# Patient Record
Sex: Male | Born: 1940 | Race: White | Hispanic: No | Smoking: Former smoker
Health system: Southern US, Community
[De-identification: ages and names within clinical notes are randomized; demographics above are authoritative.]

## PROBLEM LIST (undated history)

## (undated) DIAGNOSIS — R05 Cough: Secondary | ICD-10-CM

## (undated) DIAGNOSIS — R059 Cough, unspecified: Secondary | ICD-10-CM

## (undated) DIAGNOSIS — I739 Peripheral vascular disease, unspecified: Secondary | ICD-10-CM

## (undated) DIAGNOSIS — E785 Hyperlipidemia, unspecified: Secondary | ICD-10-CM

## (undated) DIAGNOSIS — I10 Essential (primary) hypertension: Secondary | ICD-10-CM

## (undated) DIAGNOSIS — T4145XA Adverse effect of unspecified anesthetic, initial encounter: Secondary | ICD-10-CM

## (undated) DIAGNOSIS — I82409 Acute embolism and thrombosis of unspecified deep veins of unspecified lower extremity: Secondary | ICD-10-CM

## (undated) DIAGNOSIS — M1990 Unspecified osteoarthritis, unspecified site: Secondary | ICD-10-CM

## (undated) DIAGNOSIS — R0602 Shortness of breath: Secondary | ICD-10-CM

## (undated) DIAGNOSIS — I2699 Other pulmonary embolism without acute cor pulmonale: Secondary | ICD-10-CM

## (undated) DIAGNOSIS — Z8719 Personal history of other diseases of the digestive system: Secondary | ICD-10-CM

## (undated) DIAGNOSIS — T8859XA Other complications of anesthesia, initial encounter: Secondary | ICD-10-CM

## (undated) DIAGNOSIS — E7800 Pure hypercholesterolemia, unspecified: Secondary | ICD-10-CM

## (undated) DIAGNOSIS — I2510 Atherosclerotic heart disease of native coronary artery without angina pectoris: Secondary | ICD-10-CM

## (undated) DIAGNOSIS — I219 Acute myocardial infarction, unspecified: Secondary | ICD-10-CM

## (undated) DIAGNOSIS — C4441 Basal cell carcinoma of skin of scalp and neck: Secondary | ICD-10-CM

## (undated) DIAGNOSIS — R058 Other specified cough: Secondary | ICD-10-CM

## (undated) DIAGNOSIS — K219 Gastro-esophageal reflux disease without esophagitis: Secondary | ICD-10-CM

## (undated) DIAGNOSIS — I209 Angina pectoris, unspecified: Secondary | ICD-10-CM

## (undated) HISTORY — PX: TONSILLECTOMY: SUR1361

## (undated) HISTORY — PX: CORONARY ANGIOPLASTY WITH STENT PLACEMENT: SHX49

## (undated) HISTORY — PX: COLONOSCOPY W/ POLYPECTOMY: SHX1380

## (undated) HISTORY — PX: CARPAL TUNNEL RELEASE: SHX101

## (undated) HISTORY — PX: OTHER SURGICAL HISTORY: SHX169

## (undated) SURGERY — Surgical Case
Anesthesia: *Unknown

---

## 1965-06-01 HISTORY — PX: FINGER AMPUTATION: SHX636

## 2011-04-12 DIAGNOSIS — R079 Chest pain, unspecified: Secondary | ICD-10-CM

## 2011-04-13 ENCOUNTER — Encounter: Payer: Self-pay | Admitting: Cardiovascular Disease

## 2011-04-13 ENCOUNTER — Inpatient Hospital Stay (HOSPITAL_COMMUNITY)
Admission: AD | Admit: 2011-04-13 | Discharge: 2011-04-14 | DRG: 247 | Disposition: A | Discharge status: Home / Self Care | Payer: Medicare Other | Source: Other Acute Inpatient Hospital | Attending: Cardiovascular Disease | Admitting: Cardiovascular Disease

## 2011-04-13 ENCOUNTER — Other Ambulatory Visit: Payer: Self-pay

## 2011-04-13 ENCOUNTER — Encounter (HOSPITAL_COMMUNITY)
Admission: AD | Disposition: A | Discharge status: Home / Self Care | Payer: Self-pay | Source: Other Acute Inpatient Hospital | Attending: Cardiovascular Disease

## 2011-04-13 DIAGNOSIS — I200 Unstable angina: Secondary | ICD-10-CM

## 2011-04-13 DIAGNOSIS — I10 Essential (primary) hypertension: Secondary | ICD-10-CM | POA: Diagnosis present

## 2011-04-13 DIAGNOSIS — R0602 Shortness of breath: Secondary | ICD-10-CM

## 2011-04-13 DIAGNOSIS — I2510 Atherosclerotic heart disease of native coronary artery without angina pectoris: Principal | ICD-10-CM | POA: Diagnosis present

## 2011-04-13 DIAGNOSIS — Z9861 Coronary angioplasty status: Secondary | ICD-10-CM

## 2011-04-13 DIAGNOSIS — Z7982 Long term (current) use of aspirin: Secondary | ICD-10-CM

## 2011-04-13 DIAGNOSIS — E785 Hyperlipidemia, unspecified: Secondary | ICD-10-CM | POA: Diagnosis present

## 2011-04-13 HISTORY — DX: Hyperlipidemia, unspecified: E78.5

## 2011-04-13 HISTORY — DX: Other specified cough: R05.8

## 2011-04-13 HISTORY — DX: Peripheral vascular disease, unspecified: I73.9

## 2011-04-13 HISTORY — DX: Atherosclerotic heart disease of native coronary artery without angina pectoris: I25.10

## 2011-04-13 HISTORY — DX: Angina pectoris, unspecified: I20.9

## 2011-04-13 HISTORY — DX: Acute myocardial infarction, unspecified: I21.9

## 2011-04-13 HISTORY — DX: Shortness of breath: R06.02

## 2011-04-13 HISTORY — DX: Adverse effect of unspecified anesthetic, initial encounter: T41.45XA

## 2011-04-13 HISTORY — PX: PERCUTANEOUS CORONARY STENT INTERVENTION (PCI-S): SHX5485

## 2011-04-13 HISTORY — DX: Cough: R05

## 2011-04-13 HISTORY — PX: LEFT HEART CATHETERIZATION WITH CORONARY ANGIOGRAM: SHX5451

## 2011-04-13 HISTORY — DX: Essential (primary) hypertension: I10

## 2011-04-13 HISTORY — DX: Other complications of anesthesia, initial encounter: T88.59XA

## 2011-04-13 SURGERY — LEFT HEART CATHETERIZATION WITH CORONARY ANGIOGRAM
Anesthesia: LOCAL | Site: Groin | Laterality: Right

## 2011-04-13 MED ORDER — ACETAMINOPHEN 325 MG PO TABS: 650.0000 mg | ORAL_TABLET | ORAL | Status: DC | PRN

## 2011-04-13 MED ORDER — NITROGLYCERIN 0.2 MG/ML ON CALL CATH LAB
INTRAVENOUS | Status: AC
  Filled 2011-04-13: qty 1

## 2011-04-13 MED ORDER — MORPHINE SULFATE 2 MG/ML IJ SOLN: 2.0000 mg | INTRAMUSCULAR | Status: DC | PRN

## 2011-04-13 MED ORDER — SODIUM CHLORIDE 0.9 % IV SOLN
INTRAVENOUS | Status: AC
  Filled 2011-04-13: qty 250

## 2011-04-13 MED ORDER — CLOPIDOGREL BISULFATE 75 MG PO TABS
75.0000 mg | ORAL_TABLET | Freq: Every day | ORAL | Status: DC
  Administered 2011-04-14: 75 mg via ORAL
  Filled 2011-04-13: qty 1

## 2011-04-13 MED ORDER — HEPARIN (PORCINE) IN NACL 2-0.9 UNIT/ML-% IJ SOLN
INTRAMUSCULAR | Status: AC
  Filled 2011-04-13: qty 2000

## 2011-04-13 MED ORDER — ASPIRIN 81 MG PO CHEW
81.0000 mg | CHEWABLE_TABLET | Freq: Every day | ORAL | Status: DC
  Administered 2011-04-14: 81 mg via ORAL
  Filled 2011-04-13: qty 1

## 2011-04-13 MED ORDER — FENTANYL CITRATE 0.05 MG/ML IJ SOLN
INTRAMUSCULAR | Status: AC
  Filled 2011-04-13: qty 2

## 2011-04-13 MED ORDER — ROSUVASTATIN CALCIUM 20 MG PO TABS
20.0000 mg | ORAL_TABLET | Freq: Every day | ORAL | Status: DC
  Administered 2011-04-14: 20 mg via ORAL
  Filled 2011-04-13 (×2): qty 1

## 2011-04-13 MED ORDER — METOPROLOL SUCCINATE ER 100 MG PO TB24
100.0000 mg | ORAL_TABLET | Freq: Every day | ORAL | Status: DC
  Administered 2011-04-14: 100 mg via ORAL
  Filled 2011-04-13 (×2): qty 1

## 2011-04-13 MED ORDER — SODIUM CHLORIDE 0.9 % IV SOLN: 0.2500 mg/kg/h | INTRAVENOUS | Status: DC

## 2011-04-13 MED ORDER — CLOPIDOGREL BISULFATE 300 MG PO TABS
ORAL_TABLET | ORAL | Status: AC
  Administered 2011-04-14: 75 mg via ORAL
  Filled 2011-04-13: qty 2

## 2011-04-13 MED ORDER — LIDOCAINE HCL (PF) 1 % IJ SOLN
INTRAMUSCULAR | Status: AC
  Filled 2011-04-13: qty 30

## 2011-04-13 MED ORDER — OXYCODONE-ACETAMINOPHEN 5-325 MG PO TABS: 1.0000 | ORAL_TABLET | ORAL | Status: DC | PRN

## 2011-04-13 MED ORDER — NIACIN ER 500 MG PO CPCR
750.0000 mg | ORAL_CAPSULE | Freq: Every day | ORAL | Status: DC
  Administered 2011-04-13: 750 mg via ORAL
  Filled 2011-04-13 (×2): qty 1

## 2011-04-13 MED ORDER — ENALAPRIL MALEATE 20 MG PO TABS
20.0000 mg | ORAL_TABLET | Freq: Every day | ORAL | Status: DC
  Administered 2011-04-14: 20 mg via ORAL
  Filled 2011-04-13 (×2): qty 1

## 2011-04-13 MED ORDER — ONDANSETRON HCL 4 MG/2ML IJ SOLN: 4.0000 mg | Freq: Four times a day (QID) | INTRAMUSCULAR | Status: DC | PRN

## 2011-04-13 MED ORDER — NIFEDIPINE ER OSMOTIC RELEASE 90 MG PO TB24
90.0000 mg | ORAL_TABLET | Freq: Every day | ORAL | Status: AC
  Filled 2011-04-13: qty 1

## 2011-04-13 MED ORDER — SODIUM CHLORIDE 0.9 % IV SOLN: INTRAVENOUS | Status: AC

## 2011-04-13 NOTE — H&P (Signed)
  Reviewed H&P from [facility].  Primary Cardiologist Dr Michelle Piper Degent History of stents in [facility] CT.  No details available.  Last one 6 years ago Admitted with unstable angina.  Enzymes negative.  ECG with T wave inversions In the inferior leads.  Brought straight to cath lab for diagnostic angiogram  Date of Initial H&P: 04/12/11 at [facility] will be scanned into system  History reviewed, patient examined, no change in status, stable for surgery.  Charlton Haws 1:09 PM 04/13/11

## 2011-04-13 NOTE — Procedures (Signed)
Catheterization  Indication: Unstable Angina direct admit via cath lab for urgent cath  Procedure: After informed consent and clinical "time out" the right groin was prepped and draped in a sterile fashion.  A 5Fr sheath was placed in the right femoral artery using seldinger technique and local lidocaine.  Standard JL4, JR4 and angled pigtail catheters were used to engage the coronary arteries.  Coronary arteries were visualized in orthogonal views using caudal and cranial angulation.  RAO ventriculography was done using 28 cc of contrast.    Medications:   Versed: 0 mg's  Fentanyl: 0 ug's  Coronary Arteries: Left  dominant with no anomalies  LM: 30%  LAD: proximal- 40%, mid-20% with patent stent, distal-20%    D1- small no critical disease  D2- small no critical disease   Circumflex: proximal- 30%, mid- 95% stenosis prior to previously placed stents, AV groove- 30% multiple discrete  OM1- 30% multiple discrete  OM2- 30% multiple discrete   RCA: promimal- Small nondominant wit subtotal occlusion at the RV branch,  Ventriculography: EF: 55% %, inferobasal hypokinesis  Hemodynamics:  Aortic Pressure: 145 66 mmHg  LV Pressure: 153 20 mmHg  Impression:  Reviewed with Dr Elberta Fortis  Will proceed with DES to left dominant circumflex.  Patent stent to mid LAD  Charlton Haws 04/13/2011 1:29 PM

## 2011-04-13 NOTE — Procedures (Signed)
Cardiac Catheterization Operative Report  Rodriques Badie [id number] 11/12/20122:12 PM No primary provider on file.  Procedure Performed:  1. PTCA/DES x 1 Circumflex artery  Operator: Verne Carrow, MD  Indication: 70 yo WM with h/o CAD with diagnostic catheterization today showing severe stenosis mid Circumflex artery. Diagnostic cath per Dr. Eden Emms.                                          Procedure Details: When I entered the case, the pt had a 5 French sheath present in the right femoral artery. I upsized the sheath to a 6 [place] sheath. The patient was given a bolus of Angiomax and a drip was started. The patient was given 600 mg po Plavix. I engaged the left main artery with a 6 [place] XB 3.5 guiding catheter. A Cougar IC wire was advanced down the Circumflex artery. A 2.5 mm x 12 mm balloon was inflated in the mid Circumflex. A 2.5 x 16 mm Promus Element DES was deployed in the mid Circumflex. A 2.75 mm x 12 mm balloon was used to post-dilate the stent. The patient was given an additional Fentanyl  50 mcg IV x 1. The stenosis was taken from 99% to 0%.  There were no immediate complications. An Angioseal femoral artery closure device was placed in the right femoral artery. The patient was taken to the recovery area in stable condition.   Hemodynamic Findings: Central aortic pressure:171/82   Impression: 1. Successful PTCA/DES x 1 mid Circumflex artery.   Recommendations: Dual antiplatelet therapy with ASA and Plavix for at least one year.        Complications:  None; patient tolerated the procedure well.

## 2011-04-14 ENCOUNTER — Encounter (HOSPITAL_COMMUNITY): Payer: Self-pay

## 2011-04-14 DIAGNOSIS — I200 Unstable angina: Secondary | ICD-10-CM

## 2011-04-14 LAB — CBC
HCT: 37.7 % — ABNORMAL LOW (ref 39.0–52.0)
MCH: 30 pg (ref 26.0–34.0)
MCHC: 35.3 g/dL (ref 30.0–36.0)
MCV: 85.1 fL (ref 78.0–100.0)
RDW: 13.2 % (ref 11.5–15.5)

## 2011-04-14 LAB — BASIC METABOLIC PANEL
BUN: 12 mg/dL (ref 6–23)
Calcium: 9.1 mg/dL (ref 8.4–10.5)
Creatinine, Ser: 1.05 mg/dL (ref 0.50–1.35)
GFR calc Af Amer: 80 mL/min — ABNORMAL LOW (ref >90)
GFR calc non Af Amer: 69 mL/min — ABNORMAL LOW (ref >90)
Glucose, Bld: 143 mg/dL — ABNORMAL HIGH (ref 70–99)
Potassium: 4.1 mEq/L (ref 3.5–5.1)

## 2011-04-14 MED ORDER — CLOPIDOGREL BISULFATE 75 MG PO TABS: 75.0000 mg | ORAL_TABLET | Freq: Every day | ORAL | Status: AC

## 2011-04-14 MED ORDER — NITROGLYCERIN 0.4 MG SL SUBL: 0.4000 mg | SUBLINGUAL_TABLET | SUBLINGUAL | Status: DC | PRN

## 2011-04-14 NOTE — Discharge Planning (Cosign Needed)
Physician Discharge Summary  Patient ID: Raymond Jacobson MRN: [id number] DOB/AGE: 1941/02/21 70 y.o.  Admit date: 04/13/2011 Discharge date: 04/14/2011  Primary Discharge Diagnosis: Anginal Chest Pain Secondary Discharge Diagnosis: HTN, elevated lipids, history of percutaneous intervention x3 in [facility] remotely, remote history of surgical repair to his triceps muscle 15 years ago  Significant Diagnostic Studies: Cardiac catheterization, left ventriculogram, coronary arteriogram, 2.5 x 16 mm promise drug-eluting stent to the mid circumflex  Hospital Course:  Raymond Jacobson is a 70 year old male with a history of coronary artery disease. He went to [facility] on 04/12/2011 for chest pain. He was seen there by cardiology. His cardiac enzymes were negative for MI. The total cholesterol was 157 with triglycerides of 275, HDL 46 and LDL 56. He was seen diarrhea and cardiology followup and he had some T-wave inversions in the inferior leads of his EKG. He was having some residual chest pain and a nitroglycerin drip was started. He had already been started on heparin. He was evaluated by fraction he was evaluated there by Dr. Kirke Corin who felt that cardiac catheterization was indicated. He was transferred to [facility] for the procedure.  The cardiac catheterization showed left main 30%, LAD 40% in the proximal portion with a patent distal stent and 20% in-stent restenosis. The circumflex had a mid 95% stenosis prior to previously placed stents. This was treated with PTCA and a drug-eluting stent reducing the stenosis to 0. The OM1 and OM 2 both had 30% lesions. The RCA was small and nondominant with a subtotal occlusion of the RV branch for which medical therapy is recommended. EF was 55% with inferobasal hypokinesis.  He tolerated the procedure well. On 04/14/2011 Raymond Jacobson was seen by Dr. Clifton James. He was doing well and his post procedure labs were stable. He is considered stable for discharge,  to followup as an outpatient with the [employer].  Discharge Exam: Blood pressure 136/70, pulse 68, temperature 99 F (37.2 C), temperature source Oral, resp. rate 18, height 5\' 8"  (1.727 m), weight 209 lb 15.8 oz (95.25 kg), SpO2 96.00%.   Labs:   Lab Results  Component Value Date   WBC 6.9 04/14/2011   HGB 13.3 04/14/2011   HCT 37.7* 04/14/2011   MCV 85.1 04/14/2011   PLT 170 04/14/2011    Lab 04/14/11 0546  NA 135  K 4.1  CL 98  CO2 27  BUN 12  CREATININE 1.05  CALCIUM 9.1  PROT --  BILITOT --  ALKPHOS --  ALT --  AST --  GLUCOSE 143*   EKG: Sinus rhythm/sinus bradycardia rate 59 with inferior T-wave inversions, no old available at this time.  FOLLOW UP PLANS AND APPOINTMENTS  Current Discharge Medication List    CONTINUE these medications which have NOT CHANGED   Details  aspirin 325 MG EC tablet Take 325 mg by mouth daily.      enalapril (VASOTEC) 20 MG tablet Take 20 mg by mouth daily.      metoprolol (TOPROL-XL) 100 MG 24 hr tablet Take 100 mg by mouth daily.      niacin (NIASPAN) 750 MG CR tablet Take 750 mg by mouth at bedtime.      NIFEdipine (ADALAT CC) 90 MG 24 hr tablet Take 90 mg by mouth daily.      rosuvastatin (CRESTOR) 20 MG tablet Take 20 mg by mouth daily.        STOP taking these medications     aspirin 325 MG tablet  BRING ALL MEDICATIONS WITH YOU TO FOLLOW UP APPOINTMENTS  Time spent with patient to include physician time:  Signed: Theodore Demark 04/14/2011, 9:31 AM Co-Sign MD

## 2011-04-14 NOTE — Progress Notes (Signed)
Pt plan for d/c today with plavix po. Pt is aware to get medications from [facility] until he is able to get meds from the [place] in [place]. CM did call the [facility] to make them aware that pt was admitted. No other needs assessed by CM. Gala Lewandowsky

## 2011-04-14 NOTE — Progress Notes (Signed)
CARDIAC REHAB PHASE I   PRE:  Rate/Rhythm: 67  BP:  Supine:   Sitting: 136/70   Standing:    SaO2:   MODE:  Ambulation: 680 ft   POST:  Rate/Rhythem: 81  BP:  Supine:   Sitting: 151/79  Standing:    SaO2:  0750 - 0900 Pt ambulated unit with 1 assist, no sob or cp. Education done. Will refer to card rehab [place] with pt permission.   Rosalie Doctor

## 2011-04-14 NOTE — Progress Notes (Signed)
RLE warm with good palpable pulses.

## 2011-04-14 NOTE — Progress Notes (Signed)
SUBJECTIVE: No chest pain. No events.   BP 147/70  Pulse 76  Temp(Src) 98.5 F (36.9 C) (Oral)  Resp 18  Ht 5\' 8"  (1.727 m)  Wt 209 lb 15.8 oz (95.25 kg)  BMI 31.93 kg/m2  SpO2 97%  Intake/Output Summary (Last 24 hours) at 04/14/11 4540 Last data filed at 04/14/11 0400  Gross per 24 hour  Intake    994 ml  Output   1450 ml  Net   -456 ml    PHYSICAL EXAM General: Well developed, well nourished, in no acute distress. Alert and oriented x 3.  Psych:  Good affect, responds appropriately Neck: No JVD. No masses noted.  Lungs: Clear bilaterally with no wheezes or rhonci noted.  Heart: RRR with no murmurs noted. Abdomen: Bowel sounds are present. Soft, non-tender.  Extremities: No lower extremity edema. Right groin cath site ok.   LABS:  CBC:  Basename 04/14/11 0546  WBC 6.9  NEUTROABS --  HGB 13.3  HCT 37.7*  MCV 85.1  PLT 170   Current Meds:    . aspirin  81 mg Oral Daily  . clopidogrel      . clopidogrel  75 mg Oral Q breakfast  . enalapril  20 mg Oral Daily  . fentaNYL      . heparin      . metoprolol  100 mg Oral Daily  . niacin  750 mg Oral QHS  . NIFEdipine  90 mg Oral Daily  . nitroGLYCERIN      . rosuvastatin  20 mg Oral Daily     ASSESSMENT AND PLAN:  1. CAD: s/p cath yesterday with severe stenosis Circumflex, now s/p DES Circumflex. He will be on ASA and Plavix for at least a year. He is on a statin, beta blocker and Ace-inh.   2. HTN: Adjustment of meds as an oupatient. He is on a beta blocker, Calcium channel blocker and an Ace inhibitor. He is followed in the [place].   3. HLD: On a statin. Follow up as an outpatient.   4. Dispo: D/C home today. He wishes to follow up with the [place]. He will establish with cardiology at the [facility].    Raymond Jacobson  11/13/20126:38 AM

## 2011-04-14 NOTE — Consult Note (Signed)
Tobacco Cessation- Pt says he quit many years ago but he had some general diet questions which were addressed.

## 2011-04-15 MED FILL — Heparin Sodium (Porcine) 100 Unt/ML in Sodium Chloride 0.45%: INTRAMUSCULAR | Qty: 250 | Status: AC

## 2011-04-15 MED FILL — Nitroglycerin IV Soln 200 MCG/ML in D5W: INTRAVENOUS | Qty: 250 | Status: AC

## 2013-06-20 ENCOUNTER — Ambulatory Visit (HOSPITAL_COMMUNITY): Admission: RE | Admit: 2013-06-20 | Payer: Non-veteran care | Source: Ambulatory Visit | Admitting: Cardiology

## 2013-06-20 ENCOUNTER — Encounter (HOSPITAL_COMMUNITY)
Admission: EM | Disposition: A | Discharge status: Home / Self Care | Payer: Medicare Other | Source: Home / Self Care | Attending: Cardiology

## 2013-06-20 ENCOUNTER — Encounter (HOSPITAL_COMMUNITY): Payer: Self-pay | Admitting: Physician Assistant

## 2013-06-20 ENCOUNTER — Inpatient Hospital Stay (HOSPITAL_COMMUNITY)
Admission: EM | Admit: 2013-06-20 | Discharge: 2013-06-22 | DRG: 251 | Disposition: A | Discharge status: Home / Self Care | Payer: Medicare Other | Attending: Cardiology | Admitting: Cardiology

## 2013-06-20 DIAGNOSIS — Z87891 Personal history of nicotine dependence: Secondary | ICD-10-CM

## 2013-06-20 DIAGNOSIS — I2510 Atherosclerotic heart disease of native coronary artery without angina pectoris: Secondary | ICD-10-CM | POA: Diagnosis present

## 2013-06-20 DIAGNOSIS — IMO0001 Reserved for inherently not codable concepts without codable children: Secondary | ICD-10-CM

## 2013-06-20 DIAGNOSIS — I739 Peripheral vascular disease, unspecified: Secondary | ICD-10-CM | POA: Diagnosis present

## 2013-06-20 DIAGNOSIS — Z7982 Long term (current) use of aspirin: Secondary | ICD-10-CM

## 2013-06-20 DIAGNOSIS — Z79899 Other long term (current) drug therapy: Secondary | ICD-10-CM | POA: Diagnosis not present

## 2013-06-20 DIAGNOSIS — E785 Hyperlipidemia, unspecified: Secondary | ICD-10-CM | POA: Diagnosis present

## 2013-06-20 DIAGNOSIS — I252 Old myocardial infarction: Secondary | ICD-10-CM | POA: Diagnosis not present

## 2013-06-20 DIAGNOSIS — Y849 Medical procedure, unspecified as the cause of abnormal reaction of the patient, or of later complication, without mention of misadventure at the time of the procedure: Secondary | ICD-10-CM | POA: Diagnosis present

## 2013-06-20 DIAGNOSIS — T82897A Other specified complication of cardiac prosthetic devices, implants and grafts, initial encounter: Principal | ICD-10-CM | POA: Diagnosis present

## 2013-06-20 DIAGNOSIS — I249 Acute ischemic heart disease, unspecified: Secondary | ICD-10-CM | POA: Diagnosis present

## 2013-06-20 DIAGNOSIS — R7309 Other abnormal glucose: Secondary | ICD-10-CM

## 2013-06-20 DIAGNOSIS — I240 Acute coronary thrombosis not resulting in myocardial infarction: Secondary | ICD-10-CM

## 2013-06-20 DIAGNOSIS — S68118A Complete traumatic metacarpophalangeal amputation of other finger, initial encounter: Secondary | ICD-10-CM

## 2013-06-20 DIAGNOSIS — I10 Essential (primary) hypertension: Secondary | ICD-10-CM | POA: Diagnosis present

## 2013-06-20 DIAGNOSIS — Z8249 Family history of ischemic heart disease and other diseases of the circulatory system: Secondary | ICD-10-CM

## 2013-06-20 DIAGNOSIS — I200 Unstable angina: Secondary | ICD-10-CM | POA: Diagnosis present

## 2013-06-20 DIAGNOSIS — R079 Chest pain, unspecified: Secondary | ICD-10-CM | POA: Diagnosis present

## 2013-06-20 DIAGNOSIS — I369 Nonrheumatic tricuspid valve disorder, unspecified: Secondary | ICD-10-CM | POA: Diagnosis not present

## 2013-06-20 HISTORY — PX: LEFT HEART CATHETERIZATION WITH CORONARY ANGIOGRAM: SHX5451

## 2013-06-20 LAB — CBC WITH DIFFERENTIAL/PLATELET
Basophils Absolute: 0 10*3/uL (ref 0.0–0.1)
Basophils Relative: 1 % (ref 0–1)
Eosinophils Absolute: 0.1 10*3/uL (ref 0.0–0.7)
Eosinophils Relative: 3 % (ref 0–5)
HCT: 34.9 % — ABNORMAL LOW (ref 39.0–52.0)
Hemoglobin: 12.5 g/dL — ABNORMAL LOW (ref 13.0–17.0)
Lymphocytes Relative: 32 % (ref 12–46)
Lymphs Abs: 1.2 10*3/uL (ref 0.7–4.0)
MCH: 30 pg (ref 26.0–34.0)
MCHC: 35.8 g/dL (ref 30.0–36.0)
MCV: 83.9 fL (ref 78.0–100.0)
Monocytes Absolute: 0.3 10*3/uL (ref 0.1–1.0)
Monocytes Relative: 10 % (ref 3–12)
NEUTROS ABS: 2 10*3/uL (ref 1.7–7.7)
NEUTROS PCT: 55 % (ref 43–77)
Platelets: 170 10*3/uL (ref 150–400)
RBC: 4.16 MIL/uL — AB (ref 4.22–5.81)
RDW: 13.6 % (ref 11.5–15.5)
WBC: 3.6 10*3/uL — ABNORMAL LOW (ref 4.0–10.5)

## 2013-06-20 LAB — POCT I-STAT, CHEM 8
BUN: 11 mg/dL (ref 6–23)
CREATININE: 1.1 mg/dL (ref 0.50–1.35)
Calcium, Ion: 1.22 mmol/L (ref 1.13–1.30)
Chloride: 101 mEq/L (ref 96–112)
Glucose, Bld: 117 mg/dL — ABNORMAL HIGH (ref 70–99)
HCT: 34 % — ABNORMAL LOW (ref 39.0–52.0)
Hemoglobin: 11.6 g/dL — ABNORMAL LOW (ref 13.0–17.0)
Potassium: 3.7 mEq/L (ref 3.7–5.3)
Sodium: 139 mEq/L (ref 137–147)
TCO2: 27 mmol/L (ref 0–100)

## 2013-06-20 LAB — COMPREHENSIVE METABOLIC PANEL
ALT: 15 U/L (ref 0–53)
AST: 14 U/L (ref 0–37)
Albumin: 3.5 g/dL (ref 3.5–5.2)
Alkaline Phosphatase: 91 U/L (ref 39–117)
BUN: 12 mg/dL (ref 6–23)
CHLORIDE: 102 meq/L (ref 96–112)
CO2: 25 meq/L (ref 19–32)
Calcium: 8.2 mg/dL — ABNORMAL LOW (ref 8.4–10.5)
Creatinine, Ser: 1.07 mg/dL (ref 0.50–1.35)
GFR calc Af Amer: 77 mL/min — ABNORMAL LOW (ref >90)
GFR calc non Af Amer: 67 mL/min — ABNORMAL LOW (ref >90)
Glucose, Bld: 115 mg/dL — ABNORMAL HIGH (ref 70–99)
POTASSIUM: 3.8 meq/L (ref 3.7–5.3)
Sodium: 140 mEq/L (ref 137–147)
Total Protein: 6.1 g/dL (ref 6.0–8.3)

## 2013-06-20 LAB — TROPONIN I: Troponin I: <0.3 ng/mL (ref <0.30)

## 2013-06-20 LAB — POCT ACTIVATED CLOTTING TIME: Activated Clotting Time: 437 seconds

## 2013-06-20 LAB — MRSA PCR SCREENING: MRSA BY PCR: NEGATIVE

## 2013-06-20 LAB — HEMOGLOBIN A1C
Hgb A1c MFr Bld: 6.3 % — ABNORMAL HIGH (ref <5.7)
Mean Plasma Glucose: 134 mg/dL — ABNORMAL HIGH (ref <117)

## 2013-06-20 SURGERY — LEFT HEART CATHETERIZATION WITH CORONARY ANGIOGRAM
Anesthesia: LOCAL

## 2013-06-20 MED ORDER — ACETAMINOPHEN 325 MG PO TABS
650.0000 mg | ORAL_TABLET | ORAL | Status: DC | PRN
  Administered 2013-06-21 – 2013-06-22 (×3): 650 mg via ORAL
  Filled 2013-06-20 (×3): qty 2

## 2013-06-20 MED ORDER — FENTANYL CITRATE 0.05 MG/ML IJ SOLN
INTRAMUSCULAR | Status: AC
  Filled 2013-06-20: qty 2

## 2013-06-20 MED ORDER — NITROGLYCERIN IN D5W 200-5 MCG/ML-% IV SOLN
2.0000 ug/min | INTRAVENOUS | Status: DC
  Filled 2013-06-20: qty 250

## 2013-06-20 MED ORDER — AMIODARONE HCL 150 MG/3ML IV SOLN
INTRAVENOUS | Status: AC
  Filled 2013-06-20: qty 6

## 2013-06-20 MED ORDER — NITROGLYCERIN IN D5W 200-5 MCG/ML-% IV SOLN
INTRAVENOUS | Status: AC
  Administered 2013-06-20: 20 ug
  Filled 2013-06-20: qty 250

## 2013-06-20 MED ORDER — TICAGRELOR 90 MG PO TABS
ORAL_TABLET | ORAL | Status: AC
  Filled 2013-06-20: qty 1

## 2013-06-20 MED ORDER — LIDOCAINE HCL (PF) 1 % IJ SOLN
INTRAMUSCULAR | Status: AC
  Filled 2013-06-20: qty 30

## 2013-06-20 MED ORDER — NIFEDIPINE ER OSMOTIC RELEASE 90 MG PO TB24
90.0000 mg | ORAL_TABLET | Freq: Every day | ORAL | Status: DC
  Administered 2013-06-20 – 2013-06-22 (×3): 90 mg via ORAL
  Filled 2013-06-20 (×3): qty 1

## 2013-06-20 MED ORDER — SODIUM CHLORIDE 0.9 % IJ SOLN
3.0000 mL | Freq: Two times a day (BID) | INTRAMUSCULAR | Status: DC
  Administered 2013-06-21: 3 mL via INTRAVENOUS

## 2013-06-20 MED ORDER — BIVALIRUDIN 250 MG IV SOLR
INTRAVENOUS | Status: AC
  Filled 2013-06-20: qty 250

## 2013-06-20 MED ORDER — METOPROLOL SUCCINATE ER 100 MG PO TB24
100.0000 mg | ORAL_TABLET | Freq: Every day | ORAL | Status: DC
  Administered 2013-06-20 – 2013-06-22 (×3): 100 mg via ORAL
  Filled 2013-06-20 (×4): qty 1

## 2013-06-20 MED ORDER — SODIUM CHLORIDE 0.9 % IJ SOLN: 3.0000 mL | INTRAMUSCULAR | Status: DC | PRN

## 2013-06-20 MED ORDER — HEPARIN SODIUM (PORCINE) 1000 UNIT/ML IJ SOLN
INTRAMUSCULAR | Status: AC
  Filled 2013-06-20: qty 1

## 2013-06-20 MED ORDER — NITROGLYCERIN 0.4 MG SL SUBL: 0.4000 mg | SUBLINGUAL_TABLET | SUBLINGUAL | Status: DC | PRN

## 2013-06-20 MED ORDER — SODIUM CHLORIDE 0.9 % IV SOLN: 250.0000 mL | INTRAVENOUS | Status: DC | PRN

## 2013-06-20 MED ORDER — MIDAZOLAM HCL 2 MG/2ML IJ SOLN
INTRAMUSCULAR | Status: AC
  Filled 2013-06-20: qty 2

## 2013-06-20 MED ORDER — ONDANSETRON HCL 4 MG/2ML IJ SOLN
INTRAMUSCULAR | Status: AC
  Filled 2013-06-20: qty 2

## 2013-06-20 MED ORDER — ASPIRIN 81 MG PO CHEW
81.0000 mg | CHEWABLE_TABLET | Freq: Every day | ORAL | Status: DC
  Administered 2013-06-21 – 2013-06-22 (×2): 81 mg via ORAL
  Filled 2013-06-20 (×2): qty 1

## 2013-06-20 MED ORDER — ENALAPRIL MALEATE 20 MG PO TABS
20.0000 mg | ORAL_TABLET | Freq: Every day | ORAL | Status: DC
  Administered 2013-06-20 – 2013-06-22 (×3): 20 mg via ORAL
  Filled 2013-06-20 (×3): qty 1

## 2013-06-20 MED ORDER — TICAGRELOR 90 MG PO TABS
90.0000 mg | ORAL_TABLET | Freq: Two times a day (BID) | ORAL | Status: DC
  Administered 2013-06-21 – 2013-06-22 (×3): 90 mg via ORAL
  Filled 2013-06-20 (×4): qty 1

## 2013-06-20 MED ORDER — SODIUM CHLORIDE 0.9 % IV SOLN
INTRAVENOUS | Status: DC
  Administered 2013-06-20: 19:00:00 via INTRAVENOUS

## 2013-06-20 MED ORDER — HEPARIN (PORCINE) IN NACL 2-0.9 UNIT/ML-% IJ SOLN
INTRAMUSCULAR | Status: AC
  Filled 2013-06-20: qty 1000

## 2013-06-20 MED ORDER — ATORVASTATIN CALCIUM 40 MG PO TABS
40.0000 mg | ORAL_TABLET | Freq: Every day | ORAL | Status: DC
Start: 2013-06-20 — End: 2013-06-21
  Administered 2013-06-20: 40 mg via ORAL
  Filled 2013-06-20 (×2): qty 1

## 2013-06-20 MED ORDER — VERAPAMIL HCL 2.5 MG/ML IV SOLN
INTRAVENOUS | Status: AC
Start: 2013-06-20 — End: 2013-06-20
  Filled 2013-06-20: qty 2

## 2013-06-20 MED ORDER — ADENOSINE 12 MG/4ML IV SOLN
16.0000 mL | INTRAVENOUS | Status: DC
  Filled 2013-06-20: qty 16

## 2013-06-20 MED ORDER — SODIUM CHLORIDE 0.9 % IV SOLN
INTRAVENOUS | Status: AC
  Filled 2013-06-20: qty 100

## 2013-06-20 MED ORDER — ONDANSETRON HCL 4 MG/2ML IJ SOLN
4.0000 mg | Freq: Four times a day (QID) | INTRAMUSCULAR | Status: DC | PRN
  Administered 2013-06-21: 4 mg via INTRAVENOUS
  Filled 2013-06-20: qty 2

## 2013-06-20 MED ORDER — NITROGLYCERIN 0.2 MG/ML ON CALL CATH LAB
INTRAVENOUS | Status: AC
  Filled 2013-06-20: qty 1

## 2013-06-20 NOTE — H&P (Addendum)
History and Physical Interval Note:  NAME:  Jaelyn Cloninger   MRN: [id number] DOB:  [birth year]   ADMIT DATE: 06/20/2013   06/20/2013 7:13 PM  Jenny Reichmann Wolbert is a 73 y.o. male with Known CAD - PCI to Cx (x3 stents). With the most recent Being a November 2012. At that time he had severe stenosis in the midcircumflex treated with a 2.5 mm x 16 mm Promus Element DES postdilated to 2.75 mm. The lesion was prior to the previously placed stents. [facility] He was in his usual state of health until last couple days and he started noticing left-sided substernal chest discomfort that would come and go. It wasn't exacerbated by exertion and sitting up. He noted he awoke this morning with similar symptoms. It initially went away and then returned each episode lasted 10-15 minutes. This afternoon he contacted EMS, and upon arrival he was having 10 out of 10 discomfort reduced to 2/10 after 10 mg of IV morphine. An EKG done in the field was read as inferior STEMI, however on review of ECG X. he appears to be an inferior infarct age undetermined and not in acute MI. Present as he had related to his previous symptoms. They seem to be quite similar and associated with diaphoresis and dyspnea. No heartbeat or palpitations. No syncope near-syncope , no melena hematochezia hematuria.  However upon arrival the patient continues to have 2-3/10 chest pain. Given his history of circumflex disease that may not show up well on ECG, decided the best plan of action would be to proceed with urgent cardiac catheterization for what seems to be an unstable angina/ACS process.  Past Medical History  Diagnosis Date  . CAD (coronary artery disease)     Previous stents last 6 years ago [place] CT  . Elevated lipids   . Hypertension   . Angina   . Myocardial infarction ~ 2006    "blood work"  . Peripheral vascular disease   . Dry cough   . Shortness of breath 04/13/11    "mostly w/exertion"  .  Complication of anesthesia   . PONV (postoperative nausea and vomiting)    Past Surgical History  Procedure Laterality Date  . Tricep surgery  ~ 2007    "right arm; tore all my muscles @ my elbow"  . Tonsillectomy      "when I was a young one"  . 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 Coronary angioplasty with stent placement  ~ 2004; ~ 2006; 04/13/11    "# of stents 1 (i2004);+2 (2006)+ 1 (04/13/11)= 4 total"   FAMHx: Family History  Problem Relation Age of Onset  . Heart attack Paternal Grandfather   ; noncontributory.  SOCHx:  reports that he has quit smoking. His smoking use included Cigarettes. He has a 12 pack-year smoking history. He does not have any smokeless tobacco history on file. He reports that he drinks alcohol. He reports that he does not use illicit drugs.  ALLERGIES: No Known Allergies  HOME MEDICATIONS: Prescriptions prior to admission  Medication Sig Dispense Refill  . aspirin 325 MG EC tablet Take 325 mg by mouth daily.        . enalapril (VASOTEC) 20 MG tablet Take 20 mg by mouth daily.        . metoprolol (TOPROL-XL) 100 MG 24 hr tablet Take 100 mg by mouth daily.        . niacin (NIASPAN)  750 MG CR tablet Take 750 mg by mouth at bedtime.        Marland Kitchen NIFEdipine (ADALAT CC) 90 MG 24 hr tablet Take 90 mg by mouth daily.        . nitroGLYCERIN (NITROSTAT) 0.4 MG SL tablet Place 1 tablet (0.4 mg total) under the tongue every 5 (five) minutes as needed for chest pain.  25 tablet  3  . rosuvastatin (CRESTOR) 20 MG tablet Take 20 mg by mouth daily.         no longer on dual antiplatelet therapy.  REVIEW OF SYSTEMS:  Review of Systems  Constitutional: Positive for diaphoresis. Negative for fever.  HENT: Negative for congestion.   Eyes: Negative.   Respiratory: Negative for cough and shortness of breath.   Cardiovascular: Positive for chest pain. Negative for palpitations, orthopnea, leg swelling and PND.  Gastrointestinal: Positive for nausea.  Negative for blood in stool and melena.  Genitourinary: Negative for hematuria.  Musculoskeletal: Positive for joint pain.  Neurological: Negative for dizziness.  Endo/Heme/Allergies: Negative.   Psychiatric/Behavioral: Negative.   All other systems reviewed and are negative.   PHYSICAL EXAM:Blood pressure 194/76, temperature 98.6 F (37 C), resp. rate 13, SpO2 98.00%. General appearance: alert, cooperative, appears stated age, mild distress and Mildly diaphoretic Neck: no adenopathy, no carotid bruit, no JVD, supple, symmetrical, trachea midline and thyroid not enlarged, symmetric, no tenderness/mass/nodules Lungs: clear to auscultation bilaterally, normal percussion bilaterally and Nonlabored. Heart: regular rate and rhythm, S1, S2 normal, no murmur, click, rub or gallop and normal apical impulse Abdomen: soft, non-tender; bowel sounds normal; no masses,  no organomegaly Extremities: extremities normal, atraumatic, no cyanosis or edema Pulses: 2+ and symmetric Neurologic: Alert and oriented X 3, normal strength and tone. Normal symmetric reflexes. Normal coordination and gait  IMPRESSION & PLAN The patients' history has been reviewed, patient examined, no change in status from most recent note, stable for surgery. I have reviewed the patients' chart and labs. Questions were answered to the patient's satisfaction.    Shaman Muscarella has presented today for surgery, with the diagnosis of Acute Coronary Syndrome. The various methods of treatment have been discussed with the patient and family.   Risks / Complications include, but not limited to: Death, MI, CVA/TIA, VF/VT (with defibrillation), Bradycardia (need for temporary pacer placement), contrast induced nephropathy, bleeding / bruising / hematoma / pseudoaneurysm, vascular or coronary injury (with possible emergent CT or Vascular Surgery), adverse medication reactions, infection.     After consideration of risks, benefits and other  options for treatment, the patient has consented to Procedure(s):   LEFT HEART CATHETERIZATION AND CORONARY ANGIOGRAPHY +/- AD [place]  as a surgical intervention.   We will proceed with the planned procedure. Further plans based on the results of the procedure.   Cath Lab Visit (complete for each Cath Lab visit)  Clinical Evaluation Leading to the Procedure:   ACS: yes  Non-ACS:    Anginal Classification: CCS IV  Anti-ischemic medical therapy: Maximal Therapy (2 or more classes of medications)  Non-Invasive Test Results: No non-invasive testing performed  Prior CABG: No previous CABG    ROS/FH updated  Landyn Buckalew, [address]. [place], [place]  [address]  [phone]  06/20/2013 7:13 PM

## 2013-06-20 NOTE — CV Procedure (Signed)
CARDIAC CATHETERIZATION & PERCUTANEOUS CORONARY INTERVENTION REPORT  NAME:  Raymond Jacobson   MRN: [id number] DOB:  [birth year]   ADMIT DATE: 06/20/2013 Procedure Date: 06/20/2013  INTERVENTIONAL CARDIOLOGIST: Leonie Man, M.D., MS PRIMARY CARE PROVIDER: Provider Not In System PRIMARY CARDIOLOGIST: [place], [place]  PATIENT:  Raymond Jacobson is a 73 y.o. male   PRE-OPERATIVE DIAGNOSIS:    Acute Coronary Syndrome  Known CAD  PROCEDURES PERFORMED:    LEFT HEART CATHETERIZATION WITH CORONARY ANGIOGRAPHY  CUTTING BALLOON ANGIOPLASTY ONLY OF MID CIRCUMFLEX 80% ISR - FLEXTOME 2.75 MM X 10 MM   PROCEDURE:Consent:  Risks of procedure as well as the alternatives and risks of each were explained to the (patient/caregiver).  Consent for procedure obtained. and Unable to obtain consent because of altered level of consciousness. 10 mg Morphine Given.  PROCEDURE: The patient was brought to the [facility]nd [facility] in the fasting state and prepped and draped in the usual sterile fashion for Right groin or radial access. A modified Allen's test with plethysmography was performed, revealing excellent Ulnar artery collateral flow.  Sterile technique was used including antiseptics, cap, gloves, gown, hand hygiene, mask and sheet.  Skin prep: Chlorhexidine.  Time Out: Verified patient identification, verified procedure, site/side was marked, verified correct patient position, special equipment/implants available, medications/allergies/relevent history reviewed, required imaging and test results available.  Performed  DIAGNOSTIC LEFT HEART CATHETERIZATION & ANGIOGRAPHY: Access: RIGHT RADIAL Artery; 6 Fr Sheath --  Seldinger technique (Angiocath Micropuncture Kit)  IA Radial Cocktail 10 mL, IV Heparin 4000 Units   Catheters: 5 Fr TIG 4.0, JL3.5,  5 Fr EBU, 6 Fr XB 3.5 Guide (and Angled Pigtail - that did not cross the Aortic Valve due to brachial artery spasm post  PCI).  Left Coronary Artery Angiography: XB 3.0 Guide    Right Coronary Artery Angiography: TIG 4.0  LV Hemodynamics (LV Gram): TIG 4.0 (no LV Gram).  TR Band:  1808 Hours, 17 mL air  MEDICATIONS:  Anesthesia:  Local Lidocaine 2 ml  Sedation:  2 mg IV Versed, 25 mcg IV fentanyl ;   Premedication: 10 mg Morphine given by EMS  Omnipaque Contrast: 160 ml  Anticoagulation:  IV Heparin 4000 Units ;  Then for PCI Angiomax Bolus & drip  Anti-Platelet Agent:  Brilinta 180 mg  Hemodynamics:  Central Aortic / Mean Pressures: 132/64 mmHg; 90 mmHg  Left Ventricular Pressures / EDP: 127/11 mmHg; 14 mmHg  Left Ventriculography:  Not done - unable to re-cross aortic Valve post PCI due to mild brachial artery spasm.  Coronary Anatomy:  Left Main: Large caliber vessel, trifurcates into LAD, Circumflex & Small caliber Ramus Intermedius. LAD: Large caliber vessel that has a proximal septal perforator (SP1) & Diagonal 1 (D1) followed by a short segment that is diffuse tender 20% irregular stenoses in the intervening segment between D1 and D2 which also has a septal perforator. Beyond D2, there is a widely patent stents. The vessel then is somewhat tortuous but angiographically normal as it courses around the apex perfusing the inferoapex.  D1: Moderate large-caliber vessel that perfuses part of the anterolateral wall. Mildly tortuous but free of any significant disease. Tapers down distally.  D2: Small-caliber vessel with mild luminal irregularities. Left Circumflex: Large-caliber, likely dominant vessel that gives off a very small marginal branch proximally in segment that is a widely patent stent. As it courses into the AV groove it gives off a large lateral OM1 that is widely patent.  It then courses into the AV groove where there is a stent that has a roughly 60-80% in-stent restenosis in the distal two thirds of the recently placed Promus Element DES stent. Beyond the stent the vessel  branches into 4 separate branches: 2 posterolateral branches that are moderate caliber, one small distal posterolateral branch that almost runs as a tandem PDA followed by the PDA.  OM1: Large-caliber vessel that courses along the inferolateral wall to the apex. Mild luminal irregularities proximally, but otherwise normal.  LPL 1 and 2: Moderate caliber vessels (LPL 2 >1), mild luminal irregularities.  L. PDA: This is a larger and more distal of the 2 PDA branches. It is moderate caliber with mild luminal irregularities.  Ramus Intermedius: Small-moderate-caliber vessel with an ostial 90% stenosis. The vessel itself is probably less than 2 mm in size and not amenable to PCI placed in the ostial lesion.   RCA: Small caliber nondominant vessel that is subtotally occluded just before bifurcates into an RV marginal branch and the follow on distal branch. This is similar to the previous results.  After reviewing the initial angiographic data, to potential culprit lesions are visible: Ostial Ramus Intermedius 90%, and mid-distal circumflex ISR. The Ramus lesion is not amenable to PCI, therefore medical therapy would be best for this. The circumflex lesion in some views appears to be nonsignificant however there are some the images in which it appears significant. The initial thought was to perform FFR of this lesion, however I was unable to pass the FFR wire beyond the lesion, suggesting that the lesion is more significant than initially thought. At that time the plan was to proceed with Cutting Balloon angioplasty of his in-stent restenosis.  Percutaneous Coronary Intervention:    Lesion: Distal-mid Circumflex In-Stent Restenosis --   70-80% reduced to 0%;   TIMI-3 flow pre-and post Guide: 6 Fr   EBU (already in place)   Guidewire: Initially attempted to use a [employer] that was unable to pass beyond the lesion. Therefore a [employer] wire was used for angioplasty Cutting Balloon: Flextome 2.75 mm  x 10 mm;   8 Atm x 45 Sec, Final Diameter = 2.8 mm  Post deployment angiography in multiple views, with and without guidewire in place revealed excellent stent deployment and lesion coverage.  There was no evidence of dissection or perforation.  PATIENT DISPOSITION:    The patient was transferred to the PACU holding area in a hemodynamicaly stable, chest pain free condition.  The patient tolerated the procedure well, and there were no complications.  EBL:   < 20 ml  The patient was stable before, during, and after the procedure.  POST-OPERATIVE DIAGNOSIS:    Severe 2 vessel disease with widely patent stent in the LAD and proximal circumflex, but Ostial Ramus Intermedius As Well as distal mid circumflex is a restenosis of the recently placed DES stents from 2012.  Successful Cutting Balloon angioplasty of the in-stent restenosis resulting in complete resolution of the stenosis.  Mildly elevated LVEDP.  PLAN OF CARE:  The patient will be admitted overnight for post ACS related PCI/PTCA.  Standard post radial cath care.  Brilinta therapy. My recommendation would be to continue for a minimum of 1 year as this is ACS.  I will order an Echocardiogram as I was unable to perform Left Ventriculography.  Continue home medications.  Anticipate discharge tomorrow if stable. He will need a followup with the [facility] in [place], [place].   Leonie Man, M.D., M.S.  [facility] [address]. [place], [place]  [address]  [phone]  06/20/2013 6:19 PM

## 2013-06-21 ENCOUNTER — Encounter (HOSPITAL_COMMUNITY): Payer: Self-pay | Admitting: *Deleted

## 2013-06-21 DIAGNOSIS — I2510 Atherosclerotic heart disease of native coronary artery without angina pectoris: Secondary | ICD-10-CM

## 2013-06-21 DIAGNOSIS — I369 Nonrheumatic tricuspid valve disorder, unspecified: Secondary | ICD-10-CM

## 2013-06-21 LAB — BASIC METABOLIC PANEL
BUN: 9 mg/dL (ref 6–23)
CALCIUM: 8.1 mg/dL — AB (ref 8.4–10.5)
CHLORIDE: 102 meq/L (ref 96–112)
CO2: 28 mEq/L (ref 19–32)
CREATININE: 1.07 mg/dL (ref 0.50–1.35)
GFR, EST AFRICAN AMERICAN: 77 mL/min — AB (ref >90)
GFR, EST NON AFRICAN AMERICAN: 67 mL/min — AB (ref >90)
Glucose, Bld: 116 mg/dL — ABNORMAL HIGH (ref 70–99)
Potassium: 4 mEq/L (ref 3.7–5.3)
Sodium: 140 mEq/L (ref 137–147)

## 2013-06-21 LAB — TROPONIN I
Troponin I: 0.35 ng/mL (ref <0.30)
Troponin I: <0.3 ng/mL (ref <0.30)
Troponin I: <0.3 ng/mL (ref <0.30)

## 2013-06-21 LAB — COMPREHENSIVE METABOLIC PANEL
ALBUMIN: 3.9 g/dL (ref 3.5–5.2)
ALT: 16 U/L (ref 0–53)
AST: 16 U/L (ref 0–37)
Alkaline Phosphatase: 104 U/L (ref 39–117)
BUN: 11 mg/dL (ref 6–23)
CALCIUM: 8.6 mg/dL (ref 8.4–10.5)
CHLORIDE: 96 meq/L (ref 96–112)
CO2: 26 mEq/L (ref 19–32)
CREATININE: 1.11 mg/dL (ref 0.50–1.35)
GFR calc Af Amer: 74 mL/min — ABNORMAL LOW (ref >90)
GFR, EST NON AFRICAN AMERICAN: 64 mL/min — AB (ref >90)
Glucose, Bld: 204 mg/dL — ABNORMAL HIGH (ref 70–99)
Potassium: 4 mEq/L (ref 3.7–5.3)
Sodium: 138 mEq/L (ref 137–147)
Total Bilirubin: 0.6 mg/dL (ref 0.3–1.2)
Total Protein: 6.8 g/dL (ref 6.0–8.3)

## 2013-06-21 LAB — CBC
HCT: 34.8 % — ABNORMAL LOW (ref 39.0–52.0)
Hemoglobin: 12 g/dL — ABNORMAL LOW (ref 13.0–17.0)
MCH: 29.7 pg (ref 26.0–34.0)
MCHC: 34.5 g/dL (ref 30.0–36.0)
MCV: 86.1 fL (ref 78.0–100.0)
PLATELETS: 177 10*3/uL (ref 150–400)
RBC: 4.04 MIL/uL — ABNORMAL LOW (ref 4.22–5.81)
RDW: 13.9 % (ref 11.5–15.5)
WBC: 5.5 10*3/uL (ref 4.0–10.5)

## 2013-06-21 LAB — TSH: TSH: 1.935 u[IU]/mL (ref 0.350–4.500)

## 2013-06-21 MED ORDER — GI COCKTAIL ~~LOC~~
30.0000 mL | Freq: Three times a day (TID) | ORAL | Status: DC | PRN
  Administered 2013-06-21: 30 mL via ORAL
  Filled 2013-06-21: qty 30

## 2013-06-21 MED ORDER — MORPHINE SULFATE 2 MG/ML IJ SOLN
INTRAMUSCULAR | Status: AC
  Administered 2013-06-21: 2 mg via INTRAVENOUS
  Filled 2013-06-21: qty 1

## 2013-06-21 MED ORDER — ATORVASTATIN CALCIUM 80 MG PO TABS
80.0000 mg | ORAL_TABLET | Freq: Every day | ORAL | Status: DC
  Administered 2013-06-21: 80 mg via ORAL
  Filled 2013-06-21 (×2): qty 1

## 2013-06-21 MED ORDER — HYDRALAZINE HCL 20 MG/ML IJ SOLN
10.0000 mg | Freq: Once | INTRAMUSCULAR | Status: AC
Start: 2013-06-21 — End: 2013-06-21
  Administered 2013-06-21: 10 mg via INTRAVENOUS
  Filled 2013-06-21: qty 0.5

## 2013-06-21 MED ORDER — TRAMADOL HCL 50 MG PO TABS: 50.0000 mg | ORAL_TABLET | Freq: Two times a day (BID) | ORAL | Status: DC | PRN

## 2013-06-21 MED ORDER — MORPHINE SULFATE 2 MG/ML IJ SOLN: 2.0000 mg | Freq: Once | INTRAMUSCULAR | Status: AC

## 2013-06-21 MED FILL — Sodium Chloride IV Soln 0.9%: INTRAVENOUS | Qty: 50 | Status: AC

## 2013-06-21 NOTE — Progress Notes (Signed)
  Echocardiogram 2D Echocardiogram has been performed.  Raymond Jacobson 06/21/2013, 12:32 PM

## 2013-06-21 NOTE — Progress Notes (Signed)
On call MD (Dr. Aundra Dubin) notified re pt's continued high Bp. Stated to cont to titrate the Ntg gtt up. Will monitor closely.  Dr. Aundra Dubin paged re c/o cp. Egk done. New order received. Pt also vomited about 175 cc clear sl red liquid. Zofran given. Morphine also given and ntg titrated up as ordered. will cont to monitor closely.

## 2013-06-21 NOTE — Progress Notes (Signed)
CARDIAC REHAB PHASE I   PRE:  Rate/Rhythm: 70SR  BP:  Supine: 138/64  Sitting:   Standing:    SaO2:   MODE:  Ambulation: 390 ft   POST:  Rate/Rhythm: 74Sr  BP:  Supine: 144/64  Sitting:   Standing:    SaO2:  1415-1510 Pt walked 390 ft on 10 mcg NTG without increase of chest discomfort. Belched at end of walk which pt states he has been doing today.  Pt states the walk did not make him have any increased sensation in chest. Education completed. Discussed watching carbs with HGBA1C at 6.3. Gave heart healthy diet and diet that gives servings of carbs for information. Discussed CRP2 but declined. Wants to exercise on his own. Has brilinta booklet and has been seen by case manager.    Graylon Good, RN BSN  06/21/2013 3:07 PM

## 2013-06-21 NOTE — H&P (Signed)
Pt seen & examined.   I agree with his note.  Please see pre-cath H&P.  Leonie Man, MD

## 2013-06-21 NOTE — Progress Notes (Signed)
Subjective:   Underwent cutting balloon angioplasty of promus DES (from 11/12) in the mid to distal AV groove LCX yesterday. Also had 80-90% lesion in small ramus which was treated medically.   This am with recurrent CP. Worse when lying on his back and better when rolling over. + belching.  Now on 100 mcg NTG. ECG with nonspecific TW abnormality inferiorly (unchanged). Troponin <0.3-> 0.35  LAD: Large caliber vessel that has a proximal septal perforator (SP1) & Diagonal 1 (D1) followed by a short segment that is diffuse tender 20% irregular stenoses in the intervening segment between D1 and D2 which also has a septal perforator. Beyond D2, there is a widely patent stents. The vessel then is somewhat tortuous but angiographically normal as it courses around the apex perfusing the inferoapex.  D1: Moderate large-caliber vessel that perfuses part of the anterolateral wall. Mildly tortuous but free of any significant disease. Tapers down distally.  D2: Small-caliber vessel with mild luminal irregularities. Left Circumflex: Large-caliber, likely dominant vessel that gives off a very small marginal branch proximally in segment that is a widely patent stent. As it courses into the AV groove it gives off a large lateral OM1 that is widely patent. It then courses into the AV groove where there is a stent that has a roughly 60-80% in-stent restenosis in the distal two thirds of the recently placed Promus Element DES stent. Beyond the stent the vessel branches into 4 separate branches: 2 posterolateral branches that are moderate caliber, one small distal posterolateral branch that almost runs as a tandem PDA followed by the PDA.  OM1: Large-caliber vessel that courses along the inferolateral wall to the apex. Mild luminal irregularities proximally, but otherwise normal.  LPL 1 and 2: Moderate caliber vessels (LPL 2 >1), mild luminal irregularities.  L. PDA: This is a larger and more distal of the 2 PDA branches.  It is moderate caliber with mild luminal irregularities. Ramus Intermedius: Small-moderate-caliber vessel with an ostial 90% stenosis. The vessel itself is probably less than 2 mm in size and not amenable to PCI placed in the ostial lesion.  RCA: Small caliber nondominant vessel that is subtotally occluded just before bifurcates into an RV marginal branch and the follow on distal branch. This is similar to the previous results.     Intake/Output Summary (Last 24 hours) at 06/21/13 0857 Last data filed at 06/21/13 0800  Gross per 24 hour  Intake   1220 ml  Output   1300 ml  Net    -80 ml    Current meds: . aspirin  81 mg Oral Daily  . atorvastatin  40 mg Oral q1800  . enalapril  20 mg Oral Daily  . metoprolol succinate  100 mg Oral Daily  . NIFEdipine  90 mg Oral Daily  . sodium chloride  3 mL Intravenous Q12H  . Ticagrelor  90 mg Oral BID   Infusions: . sodium chloride 10 mL/hr at 06/21/13 0200  . nitroGLYCERIN 100 mcg/min (06/21/13 0500)     Objective:  Blood pressure 159/55, pulse 55, temperature 97.7 F (36.5 C), temperature source Oral, resp. rate 9, height 5\' 8"  (1.727 m), weight 100 kg (220 lb 7.4 oz), SpO2 99.00%. Weight change:   Physical Exam: General:  Well appearing. No resp difficulty HEENT: normal Neck: supple. JVP flat . Carotids 2+ bilat; no bruits. No lymphadenopathy or thryomegaly appreciated. Cor: PMI nondisplaced. No pain on palpation  Regular rate & rhythm. No rubs, gallops or murmurs. Lungs: clear Abdomen:  soft, nontender, nondistended. No hepatosplenomegaly. No bruits or masses. Good bowel sounds. Extremities: no cyanosis, clubbing, rash, edema Neuro: alert & orientedx3, cranial nerves grossly intact. moves all 4 extremities w/o difficulty. Affect pleasant  Telemetry: SR 64  Lab Results: Basic Metabolic Panel:  Recent Labs Lab 06/20/13 1616 06/20/13 1656 06/21/13 0335  NA 140 139 140  K 3.8 3.7 4.0  CL 102 101 102  CO2 25  --  28    GLUCOSE 115* 117* 116*  BUN 12 11 9   CREATININE 1.07 1.10 1.07  CALCIUM 8.2*  --  8.1*   Liver Function Tests:  Recent Labs Lab 06/20/13 1616  AST 14  ALT 15  ALKPHOS 91  BILITOT <0.2*  PROT 6.1  ALBUMIN 3.5   No results found for this basename: LIPASE, AMYLASE,  in the last 168 hours No results found for this basename: AMMONIA,  in the last 168 hours CBC:  Recent Labs Lab 06/20/13 1616 06/20/13 1656 06/21/13 0335  WBC 3.6*  --  5.5  NEUTROABS 2.0  --   --   HGB 12.5* 11.6* 12.0*  HCT 34.9* 34.0* 34.8*  MCV 83.9  --  86.1  PLT 170  --  177   Cardiac Enzymes:  Recent Labs Lab 06/20/13 1616 06/20/13 1925 06/21/13 0013 06/21/13 0730  TROPONINI <0.30 <0.30 <0.30 0.35*   BNP: No components found with this basename: POCBNP,  CBG: No results found for this basename: GLUCAP,  in the last 168 hours Microbiology: No results found for this basename: cult   No results found for this basename: CULT, SDES,  in the last 168 hours  Imaging: No results found.   ASSESSMENT:  1. USA/ACS - s/p cutting balloon PTCA to LCX on 1/20 2. CAD 3. PAD 4. HTN 5. CP  PLAN/DISCUSSION:  I do not think current CP is anginal. Will begin to wean NTG and see how he responds. Will also cycle CE. If CE increasing or CP worse with lower NTG will need relook. Otherwise will treat CP and indigestion with GI cocktail and ultram. There is no rub on exam. Keep in SDU today. Continue b-blocker, asa, Brillinta. Increase statin.   Quillian Quince Eevee Borbon,MD 9:19 AM     LOS: 1 day    Glori Bickers, MD 06/21/2013, 8:57 AM

## 2013-06-21 NOTE — Care Management Note (Signed)
    Page 1 of 1   06/21/2013     9:31:51 AM   CARE MANAGEMENT NOTE 06/21/2013  Patient:  KAVISH, LAFITTE   Account Number:  [id number]  Date Initiated:  06/21/2013  Documentation initiated by:  Elissa Hefty  Subjective/Objective Assessment:   adm w mi     Action/Plan:   lives alone  pcp is [facility]   Anticipated DC Date:     Anticipated DC Plan:        DC Planning Services  CM consult  Medication Assistance      Choice offered to / List presented to:             Status of service:   Medicare Important Message given?   (If response is "NO", the following Medicare IM given date fields will be blank) Date Medicare IM given:   Date Additional Medicare IM given:    Discharge Disposition:    Per UR Regulation:  Reviewed for med. necessity/level of care/duration of stay  If discussed at [place] of Stay Meetings, dates discussed:    Comments:  1/21 0930 debbie Tou Hayner rn,bsn gave pt 30day free brilinta card. [facility]

## 2013-06-21 NOTE — Progress Notes (Addendum)
Dr Aundra Dubin notified of increase troponin, and ntg rate. No orders, wait for rounding team

## 2013-06-22 LAB — CBC
HCT: 38.5 % — ABNORMAL LOW (ref 39.0–52.0)
Hemoglobin: 13.3 g/dL (ref 13.0–17.0)
MCH: 29.9 pg (ref 26.0–34.0)
MCHC: 34.5 g/dL (ref 30.0–36.0)
MCV: 86.5 fL (ref 78.0–100.0)
Platelets: 191 10*3/uL (ref 150–400)
RBC: 4.45 MIL/uL (ref 4.22–5.81)
RDW: 13.8 % (ref 11.5–15.5)
WBC: 7 10*3/uL (ref 4.0–10.5)

## 2013-06-22 MED ORDER — ATORVASTATIN CALCIUM 80 MG PO TABS: 80.0000 mg | ORAL_TABLET | Freq: Every evening | ORAL | Status: DC

## 2013-06-22 MED ORDER — ENALAPRIL MALEATE 20 MG PO TABS: 20.0000 mg | ORAL_TABLET | Freq: Two times a day (BID) | ORAL | Status: DC

## 2013-06-22 MED ORDER — ENALAPRIL MALEATE 20 MG PO TABS: 40.0000 mg | ORAL_TABLET | Freq: Every day | ORAL | Status: DC

## 2013-06-22 MED ORDER — NITROGLYCERIN 0.4 MG SL SUBL: 0.4000 mg | SUBLINGUAL_TABLET | SUBLINGUAL | Status: DC | PRN

## 2013-06-22 MED ORDER — TICAGRELOR 90 MG PO TABS
90.0000 mg | ORAL_TABLET | Freq: Two times a day (BID) | ORAL | Status: DC
Start: 2013-06-22 — End: 2015-10-09

## 2013-06-22 MED ORDER — TICAGRELOR 90 MG PO TABS
90.0000 mg | ORAL_TABLET | Freq: Two times a day (BID) | ORAL | Status: DC
Start: 2013-06-22 — End: 2013-06-22

## 2013-06-22 MED ORDER — ENALAPRIL MALEATE 20 MG PO TABS
20.0000 mg | ORAL_TABLET | Freq: Once | ORAL | Status: AC
  Administered 2013-06-22: 20 mg via ORAL
  Filled 2013-06-22: qty 1

## 2013-06-22 NOTE — Discharge Instructions (Signed)
PLEASE REMEMBER TO BRING ALL OF YOUR MEDICATIONS TO EACH OF YOUR FOLLOW-UP OFFICE VISITS. ° °PLEASE ATTEND ALL SCHEDULED FOLLOW-UP APPOINTMENTS.  ° °Activity: Increase activity slowly as tolerated. You may shower, but no soaking baths (or swimming) for 1 week. No driving for 2 days. No lifting over 5 lbs for 1 week. No sexual activity for 1 week.  ° °You May Return to Work: in 1 week (if applicable) ° °Wound Care: You may wash cath site gently with soap and water. Keep cath site clean and dry. If you notice pain, swelling, bleeding or pus at your cath site, please call [phone]. ° ° ° °Cardiac Cath Site Care °Refer to this sheet in the next few weeks. These instructions provide you with information on caring for yourself after your procedure. Your caregiver may also give you more specific instructions. Your treatment has been planned according to current medical practices, but problems sometimes occur. Call your caregiver if you have any problems or questions after your procedure. °HOME CARE INSTRUCTIONS °· You may shower 24 hours after the procedure. Remove the bandage (dressing) and gently wash the site with plain soap and water. Gently pat the site dry.  °· Do not apply powder or lotion to the site.  °· Do not sit in a bathtub, swimming pool, or whirlpool for 5 to 7 days.  °· No bending, squatting, or lifting anything over 10 pounds (4.5 kg) as directed by your caregiver.  °· Inspect the site at least twice daily.  °· Do not drive home if you are discharged the same day of the procedure. Have someone else drive you.  °· You may drive 24 hours after the procedure unless otherwise instructed by your caregiver.  °What to expect: °· Any bruising will usually fade within 1 to 2 weeks.  °· Blood that collects in the tissue (hematoma) may be painful to the touch. It should usually decrease in size and tenderness within 1 to 2 weeks.  °SEEK IMMEDIATE MEDICAL CARE IF: °· You have unusual pain at the site or down the  affected limb.  °· You have redness, warmth, swelling, or pain at the site.  °· You have drainage (other than a small amount of blood on the dressing).  °· You have chills.  °· You have a fever or persistent symptoms for more than 72 hours.  °· You have a fever and your symptoms suddenly get worse.  °· Your leg becomes pale, cool, tingly, or numb.  °· You have heavy bleeding from the site. Hold pressure on the site.  °Document Released: 06/20/2010 Document Revised: 05/07/2011 Document Reviewed: 06/20/2010 °ExitCare® Patient Information ©2012 [employer]. ° °

## 2013-06-22 NOTE — Progress Notes (Signed)
Inpatient Diabetes Program Recommendations  AACE/ADA: New Consensus Statement on Inpatient Glycemic Control (2013)  Target Ranges:  Prepandial:   less than 140 mg/dL      Peak postprandial:   less than 180 mg/dL (1-2 hours)      Critically ill patients:  140 - 180 mg/dL   Inpatient Diabetes Program Recommendations HgbA1C: =6.3 patient at risk for diabetes  Note: recommend monitoring CBGs during hospitalization. Thank you  Raoul Pitch BSN, RN,CDE Inpatient Diabetes Coordinator [phone] (team pager)

## 2013-06-22 NOTE — Progress Notes (Signed)
CARDIAC REHAB PHASE I   PRE:  Rate/Rhythm: 68 SR    BP: sitting 166/76    SaO2:   MODE:  Ambulation: 740 ft   POST:  Rate/Rhythm: 84 SR    BP: sitting 175/69     SaO2:   Tolerated well. No CP walking. Sts he has realized that he feels his CP when he gets out of bed and presses on his right side. Felt good walking except has a HA. No questions regarding ed. [phone]   Josephina Shih [place] CES, ACSM 06/22/2013 10:08 AM

## 2013-06-22 NOTE — Progress Notes (Addendum)
Patient ID: Raymond Jacobson, male   DOB: [birth year], 73 y.o.   MRN: [id number]  Subjective:   Underwent cutting balloon angioplasty of promus DES (from 11/12) in the mid to distal AV groove LCX yesterday. Also had 80-90% lesion in small ramus which was treated medically.   Chest pain yesterday with 1 elevated TnI to 0.35, 3 others < 0.3.  Chest pain occasionally today but only when he pushes up to get out of bed. BP has been hard to control.   LAD: Large caliber vessel that has a proximal septal perforator (SP1) & Diagonal 1 (D1) followed by a short segment that is diffuse tender 20% irregular stenoses in the intervening segment between D1 and D2 which also has a septal perforator. Beyond D2, there is a widely patent stents. The vessel then is somewhat tortuous but angiographically normal as it courses around the apex perfusing the inferoapex.  D1: Moderate large-caliber vessel that perfuses part of the anterolateral wall. Mildly tortuous but free of any significant disease. Tapers down distally.  D2: Small-caliber vessel with mild luminal irregularities. Left Circumflex: Large-caliber, likely dominant vessel that gives off a very small marginal branch proximally in segment that is a widely patent stent. As it courses into the AV groove it gives off a large lateral OM1 that is widely patent. It then courses into the AV groove where there is a stent that has a roughly 60-80% in-stent restenosis in the distal two thirds of the recently placed Promus Element DES stent. Beyond the stent the vessel branches into 4 separate branches: 2 posterolateral branches that are moderate caliber, one small distal posterolateral branch that almost runs as a tandem PDA followed by the PDA.  OM1: Large-caliber vessel that courses along the inferolateral wall to the apex. Mild luminal irregularities proximally, but otherwise normal.  LPL 1 and 2: Moderate caliber vessels (LPL 2 >1), mild luminal irregularities.  L. PDA: This is  a larger and more distal of the 2 PDA branches. It is moderate caliber with mild luminal irregularities. Ramus Intermedius: Small-moderate-caliber vessel with an ostial 90% stenosis. The vessel itself is probably less than 2 mm in size and not amenable to PCI placed in the ostial lesion.  RCA: Small caliber nondominant vessel that is subtotally occluded just before bifurcates into an RV marginal branch and the follow on distal branch. This is similar to the previous results.     Intake/Output Summary (Last 24 hours) at 06/22/13 1015 Last data filed at 06/22/13 0600  Gross per 24 hour  Intake    493 ml  Output   2100 ml  Net  -1607 ml    Current meds: . aspirin  81 mg Oral Daily  . atorvastatin  80 mg Oral q1800  . enalapril  20 mg Oral Once  . [START ON 06/23/2013] enalapril  40 mg Oral Daily  . metoprolol succinate  100 mg Oral Daily  . NIFEdipine  90 mg Oral Daily  . sodium chloride  3 mL Intravenous Q12H  . Ticagrelor  90 mg Oral BID   Infusions: . sodium chloride 10 mL/hr at 06/22/13 0600  . nitroGLYCERIN 10 mcg/min (06/22/13 0600)     Objective:  Blood pressure 169/78, pulse 66, temperature 98 F (36.7 C), temperature source Oral, resp. rate 17, height 5\' 8"  (1.727 m), weight 220 lb 7.4 oz (100 kg), SpO2 96.00%. Weight change:   Physical Exam: General:  Well appearing. No resp difficulty HEENT: normal Neck: supple. JVP flat . Carotids 2+  bilat; no bruits. No lymphadenopathy or thryomegaly appreciated. Cor: PMI nondisplaced. No pain on palpation  Regular rate & rhythm. No rubs, gallops or murmurs. Lungs: clear Abdomen: soft, nontender, nondistended. No hepatosplenomegaly. No bruits or masses. Good bowel sounds. Extremities: no cyanosis, clubbing, rash, edema Neuro: alert & orientedx3, cranial nerves grossly intact. moves all 4 extremities w/o difficulty. Affect pleasant  Telemetry: SR 64  Lab Results: Basic Metabolic Panel:  Recent Labs Lab 06/20/13 1616  06/20/13 1656 06/21/13 0335 06/21/13 1030  NA 140 139 140 138  K 3.8 3.7 4.0 4.0  CL 102 101 102 96  CO2 25  --  28 26  GLUCOSE 115* 117* 116* 204*  BUN 12 11 9 11   CREATININE 1.07 1.10 1.07 1.11  CALCIUM 8.2*  --  8.1* 8.6   Liver Function Tests:  Recent Labs Lab 06/20/13 1616 06/21/13 1030  AST 14 16  ALT 15 16  ALKPHOS 91 104  BILITOT <0.2* 0.6  PROT 6.1 6.8  ALBUMIN 3.5 3.9   No results found for this basename: LIPASE, AMYLASE,  in the last 168 hours No results found for this basename: AMMONIA,  in the last 168 hours CBC:  Recent Labs Lab 06/20/13 1616 06/20/13 1656 06/21/13 0335 06/22/13 0002  WBC 3.6*  --  5.5 7.0  NEUTROABS 2.0  --   --   --   HGB 12.5* 11.6* 12.0* 13.3  HCT 34.9* 34.0* 34.8* 38.5*  MCV 83.9  --  86.1 86.5  PLT 170  --  177 191   Cardiac Enzymes:  Recent Labs Lab 06/21/13 0013 06/21/13 0730 06/21/13 1030 06/21/13 1546 06/21/13 2200  TROPONINI <0.30 0.35* <0.30 <0.30 <0.30   BNP: No components found with this basename: POCBNP,  CBG: No results found for this basename: GLUCAP,  in the last 168 hours Microbiology: No results found for this basename: cult   No results found for this basename: CULT, SDES,  in the last 168 hours  Imaging: No results found.   ASSESSMENT:  1. USA/ACS - s/p cutting balloon PTCA to LCX on 1/20 2. CAD 3. PAD 4. HTN 5. CP  PLAN/DISCUSSION:  I do not think current CP is anginal, it is positional and probably musculoskeletal. He walked today with no problems.  Can stop NTG completely.  Will continue current meds except increase enalapril to 40 mg daily for BP control.  I think that he can go home today.  Cardiac meds: ASA 81, Brilinta 90 bid, enalapril 20 mg bid, Toprol XL 100 daily, nifedipine ER 90 daily, atorvastatin 80 daily.  Followup cardiology in 2 wks and needs cardiac rehab.    Jenee Spaugh,MD 10:15 AM

## 2013-06-22 NOTE — Discharge Summary (Signed)
CARDIOLOGY DISCHARGE SUMMARY   Patient ID: Raymond Jacobson MRN: [id number] DOB/AGE: 04-Feb-1941 3 y.o.  Admit date: 06/20/2013 Discharge date: 06/22/2013  [facility] [place] [facility] Cardiologist: wants f/u in [place]  Primary Discharge Diagnosis:    ACS (acute coronary syndrome)  Secondary Discharge Diagnosis:    HTN (hypertension)   Elevated lipids   Elevated hemoglobin A1c  PROCEDURES PERFORMED:  LEFT HEART CATHETERIZATION WITH CORONARY ANGIOGRAPHY  CUTTING BALLOON ANGIOPLASTY ONLY OF MID CIRCUMFLEX 80% ISR - FLEXTOME 2.75 MM X 10 MM  2-D echocardiogram  Hospital Course: Raymond Jacobson is a 73 y.o. male with a history of CAD. Last PCI was in November of 2012. He developed intermittent left-sided chest pain. On the day of admission he reached a 10/10. EMS was called and had improvement in pain with IV morphine. His ECG was abnormal and he was transported to the hospital where he was admitted for further evaluation and treatment.  He had one troponin that was minimally elevated at 0.35 but with her troponins were negative. He had a history circumflex disease it was felt this would not show up well on ECG. He was taken to the cath lab on 1/20.  Results are below. He had in-stent restenosis to the mid circumflex at 70-80%. The films were reviewed and he also has a small ramus intermedius with an ostial 90% stenosis. It is probably less than 2 mm in size and not amenable to PCI. His RCA is subtotally occluded, similar to previous results. He attempted FFR of the circumflex but was unable to pass the wire. This suggested that the circumflex lesion was significant and Cutting Balloon angioplasty was performed, reducing the stenosis to 0. He tolerated the procedure well. Medical therapy is the best option for the ramus intermedius and RCA lesions.  He tolerated the procedure well and the sheath was removed without difficulty. He had chest pain later on was evaluated by Dr. Haroldine Laws. His cardiac  enzymes were cycled but remained negative. He had a GI cocktail and Ultram. He had been started on a beta blocker, aspirin and Brilinta. His statin was increased.  His labs were reviewed. A hemoglobin A1c was slightly elevated at 6.3. No medications are indicated but he is encouraged to go on a diabetic diet. A TSH was performed and was within normal limits.  He was seen by cardiac rehabilitation on 01/22 and educated on exercise guidelines, heart-healthy diet and activity guidelines.  He was seen by Dr. Aundra Dubin and all data were reviewed. His chest pain was atypical and felt likely musculoskeletal. He was ambulating without difficulty. His medications were adjusted for better blood pressure control. No further inpatient workup was indicated and he is considered stable for discharge, to follow up as an outpatient. An appointment has been made in [place] but he may need to obtain cardiology followup at the [facility] due to financial considerations.  Labs:   Lab Results  Component Value Date   WBC 7.0 06/22/2013   HGB 13.3 06/22/2013   HCT 38.5* 06/22/2013   MCV 86.5 06/22/2013   PLT 191 06/22/2013     Recent Labs Lab 06/21/13 1030  NA 138  K 4.0  CL 96  CO2 26  BUN 11  CREATININE 1.11  CALCIUM 8.6  PROT 6.8  BILITOT 0.6  ALKPHOS 104  ALT 16  AST 16  GLUCOSE 204*    Recent Labs  06/21/13 1030 06/21/13 1546 06/21/13 2200  TROPONINI <0.30 <0.30 <0.30   Lab Results  Component Value Date   HGBA1C 6.3* 06/20/2013   Lab Results  Component Value Date   TSH 1.935 06/20/2013    Cardiac Cath: 06/20/2013 Coronary Anatomy:  Left Main: Large caliber vessel, trifurcates into LAD, Circumflex & Small caliber Ramus Intermedius. LAD: Large caliber vessel that has a proximal septal perforator (SP1) & Diagonal 1 (D1) followed by a short segment that is diffuse tender 20% irregular stenoses in the intervening segment between D1 and D2 which also has a septal perforator. Beyond D2, there is a  widely patent stents. The vessel then is somewhat tortuous but angiographically normal as it courses around the apex perfusing the inferoapex.  D1: Moderate large-caliber vessel that perfuses part of the anterolateral wall. Mildly tortuous but free of any significant disease. Tapers down distally.  D2: Small-caliber vessel with mild luminal irregularities. Left Circumflex: Large-caliber, likely dominant vessel that gives off a very small marginal branch proximally in segment that is a widely patent stent. As it courses into the AV groove it gives off a large lateral OM1 that is widely patent. It then courses into the AV groove where there is a stent that has a roughly 60-80% in-stent restenosis in the distal two thirds of the recently placed Promus Element DES stent. Beyond the stent the vessel branches into 4 separate branches: 2 posterolateral branches that are moderate caliber, one small distal posterolateral branch that almost runs as a tandem PDA followed by the PDA.  OM1: Large-caliber vessel that courses along the inferolateral wall to the apex. Mild luminal irregularities proximally, but otherwise normal.  LPL 1 and 2: Moderate caliber vessels (LPL 2 >1), mild luminal irregularities.  L. PDA: This is a larger and more distal of the 2 PDA branches. It is moderate caliber with mild luminal irregularities. Ramus Intermedius: Small-moderate-caliber vessel with an ostial 90% stenosis. The vessel itself is probably less than 2 mm in size and not amenable to PCI placed in the ostial lesion.  RCA: Small caliber nondominant vessel that is subtotally occluded just before bifurcates into an RV marginal branch and the follow on distal branch. This is similar to the previous results. After reviewing the initial angiographic data, to potential culprit lesions are visible: Ostial Ramus Intermedius 90%, and mid-distal circumflex ISR. The Ramus lesion is not amenable to PCI, therefore medical therapy would be best  for this. The circumflex lesion in some views appears to be nonsignificant however there are some the images in which it appears significant. The initial thought was to perform FFR of this lesion, however I was unable to pass the FFR wire beyond the lesion, suggesting that the lesion is more significant than initially thought. At that time the plan was to proceed with Cutting Balloon angioplasty of his in-stent restenosis.  Percutaneous Coronary Intervention:  Lesion: Distal-mid Circumflex In-Stent Restenosis --  70-80% reduced to 0%;  TIMI-3 flow pre-and post Guide: 6 Fr EBU (already in place)  Guidewire: Initially attempted to use a [employer] that was unable to pass beyond the lesion. Therefore a [employer] wire was used for angioplasty  Cutting Balloon: Flextome 2.75 mm x 10 mm;  8 Atm x 45 Sec, Final Diameter = 2.8 mm Post deployment angiography in multiple views, with and without guidewire in place revealed excellent stent deployment and lesion coverage. There was no evidence of dissection or perforation.  PATIENT DISPOSITION:  The patient was transferred to the PACU holding area in a hemodynamicaly stable, chest pain free condition.  The patient tolerated the procedure  well, and there were no complications. EBL: < 20 ml  The patient was stable before, during, and after the procedure. POST-OPERATIVE DIAGNOSIS:  Severe 2 vessel disease with widely patent stent in the LAD and proximal circumflex, but Ostial Ramus Intermedius As Well as distal mid circumflex is a restenosis of the recently placed DES stents from 2012.  Successful Cutting Balloon angioplasty of the in-stent restenosis resulting in complete resolution of the stenosis.  Mildly elevated LVEDP. PLAN OF CARE:  The patient will be admitted overnight for post ACS related PCI/PTCA.  Standard post radial cath care.  Brilinta therapy. My recommendation would be to continue for a minimum of 1 year as this is ACS.  I will order an  Echocardiogram as I was unable to perform Left Ventriculography.  Continue home medications.  Anticipate discharge tomorrow if stable. He will need a followup with [facility] in [place], [place].   EKG: 06/21/2013 SR Vent. rate 57 BPM PR interval 168 ms QRS duration 100 ms QT/QTc 452/439 ms P-R-T axes 32 4 -7  Echo: 06/21/2013 Conclusions - Left ventricle: The cavity size was normal. Wall thickness was increased in a pattern of mild LVH. Systolic function was normal. The estimated ejection fraction was in the range of 60% to 65%. Wall motion was normal; there were no regional wall motion abnormalities. Doppler parameters are consistent with abnormal left ventricular relaxation (grade 1 diastolic dysfunction). - Left atrium: The atrium was moderately dilated. - Right atrium: The atrium was mildly dilated. - Tricuspid valve: Moderate regurgitation. - Pulmonary arteries: PA peak pressure: 37m Hg (S).  FOLLOW UP PLANS AND APPOINTMENTS No Known Allergies   Medication List         aspirin EC 81 MG tablet  Take 81 mg by mouth every evening.     atorvastatin 80 MG tablet  Commonly known as:  LIPITOR  Take 1 tablet (80 mg total) by mouth every evening.     enalapril 20 MG tablet  Commonly known as:  VASOTEC  Take 1 tablet (20 mg total) by mouth 2 (two) times daily.     Fish Oil 500 MG Caps  Take 1,000 mg by mouth every evening.     metoprolol 200 MG 24 hr tablet  Commonly known as:  TOPROL-XL  Take 100 mg by mouth daily.     niacin 750 MG CR tablet  Commonly known as:  NIASPAN  Take 750 mg by mouth at bedtime.     NIFEdipine 90 MG 24 hr tablet  Commonly known as:  ADALAT CC  Take 90 mg by mouth every evening.     nitroGLYCERIN 0.4 MG SL tablet  Commonly known as:  NITROSTAT  Place 1 tablet (0.4 mg total) under the tongue every 5 (five) minutes as needed for chest pain.     Ticagrelor 90 MG Tabs tablet  Commonly known as:  BRILINTA  Take 1 tablet (90 mg  total) by mouth 2 (two) times daily.        Discharge Orders   Future Appointments Provider Department Dept Phone   07/06/2013 1:30 PM KLendon Colonel NP [facility] [id number] [phone]  Future Orders Complete By Expires   Diet - low sodium heart healthy  As directed    Diet Carb Modified  As directed    Increase activity slowly  As directed      Follow-up Information   Follow up with KJory Sims NP On 07/06/2013. (at 1:30 pm)  Specialty:  Nurse Practitioner   Contact information:   [place] [address] [phone]       BRING ALL MEDICATIONS WITH YOU TO FOLLOW UP APPOINTMENTS  Time spent with patient to include physician time: 39 min Signed: Rosaria Ferries, PA-C 06/22/2013, 11:38 AM Co-Sign MD

## 2013-07-06 ENCOUNTER — Encounter: Payer: Medicare Other | Admitting: Adult Health

## 2014-05-10 ENCOUNTER — Encounter (HOSPITAL_COMMUNITY): Payer: Self-pay | Admitting: Cardiovascular Disease

## 2015-10-01 ENCOUNTER — Ambulatory Visit (HOSPITAL_BASED_OUTPATIENT_CLINIC_OR_DEPARTMENT_OTHER)
Admit: 2015-10-01 | Discharge: 2015-10-01 | Disposition: A | Discharge status: Home / Self Care | Payer: No Typology Code available for payment source | Attending: Emergency Medicine | Admitting: Emergency Medicine

## 2015-10-01 ENCOUNTER — Telehealth: Payer: Self-pay | Admitting: Vascular Surgery

## 2015-10-01 ENCOUNTER — Encounter (HOSPITAL_COMMUNITY): Payer: Self-pay

## 2015-10-01 ENCOUNTER — Emergency Department (HOSPITAL_COMMUNITY)
Admission: EM | Admit: 2015-10-01 | Discharge: 2015-10-01 | Disposition: A | Discharge status: Home / Self Care | Payer: Medicare Other | Attending: Emergency Medicine | Admitting: Emergency Medicine

## 2015-10-01 DIAGNOSIS — E785 Hyperlipidemia, unspecified: Secondary | ICD-10-CM | POA: Insufficient documentation

## 2015-10-01 DIAGNOSIS — I739 Peripheral vascular disease, unspecified: Secondary | ICD-10-CM | POA: Diagnosis not present

## 2015-10-01 DIAGNOSIS — R0602 Shortness of breath: Secondary | ICD-10-CM | POA: Insufficient documentation

## 2015-10-01 DIAGNOSIS — I10 Essential (primary) hypertension: Secondary | ICD-10-CM | POA: Insufficient documentation

## 2015-10-01 DIAGNOSIS — Z79899 Other long term (current) drug therapy: Secondary | ICD-10-CM | POA: Insufficient documentation

## 2015-10-01 DIAGNOSIS — I25119 Atherosclerotic heart disease of native coronary artery with unspecified angina pectoris: Secondary | ICD-10-CM | POA: Insufficient documentation

## 2015-10-01 DIAGNOSIS — Z87891 Personal history of nicotine dependence: Secondary | ICD-10-CM | POA: Insufficient documentation

## 2015-10-01 DIAGNOSIS — Z7901 Long term (current) use of anticoagulants: Secondary | ICD-10-CM | POA: Insufficient documentation

## 2015-10-01 DIAGNOSIS — Z9861 Coronary angioplasty status: Secondary | ICD-10-CM | POA: Insufficient documentation

## 2015-10-01 DIAGNOSIS — Z9889 Other specified postprocedural states: Secondary | ICD-10-CM | POA: Insufficient documentation

## 2015-10-01 DIAGNOSIS — E010 Iodine-deficiency related diffuse (endemic) goiter: Secondary | ICD-10-CM | POA: Insufficient documentation

## 2015-10-01 DIAGNOSIS — I252 Old myocardial infarction: Secondary | ICD-10-CM | POA: Insufficient documentation

## 2015-10-01 LAB — CBC WITH DIFFERENTIAL/PLATELET
BASOS ABS: 0.1 10*3/uL (ref 0.0–0.1)
Basophils Relative: 1 %
Eosinophils Absolute: 0.1 10*3/uL (ref 0.0–0.7)
Eosinophils Relative: 3 %
HEMATOCRIT: 37.3 % — AB (ref 39.0–52.0)
HEMOGLOBIN: 13 g/dL (ref 13.0–17.0)
LYMPHS PCT: 30 %
Lymphs Abs: 1.2 10*3/uL (ref 0.7–4.0)
MCH: 30.7 pg (ref 26.0–34.0)
MCHC: 34.9 g/dL (ref 30.0–36.0)
MCV: 88 fL (ref 78.0–100.0)
Monocytes Absolute: 0.4 10*3/uL (ref 0.1–1.0)
Monocytes Relative: 9 %
NEUTROS ABS: 2.3 10*3/uL (ref 1.7–7.7)
NEUTROS PCT: 57 %
PLATELETS: 166 10*3/uL (ref 150–400)
RBC: 4.24 MIL/uL (ref 4.22–5.81)
RDW: 13.5 % (ref 11.5–15.5)
WBC: 4.1 10*3/uL (ref 4.0–10.5)

## 2015-10-01 LAB — COMPREHENSIVE METABOLIC PANEL
ALT: 17 U/L (ref 17–63)
ANION GAP: 9 (ref 5–15)
AST: 19 U/L (ref 15–41)
Albumin: 3.9 g/dL (ref 3.5–5.0)
Alkaline Phosphatase: 80 U/L (ref 38–126)
BUN: 16 mg/dL (ref 6–20)
CHLORIDE: 99 mmol/L — AB (ref 101–111)
CO2: 29 mmol/L (ref 22–32)
Calcium: 9 mg/dL (ref 8.9–10.3)
Creatinine, Ser: 1.26 mg/dL — ABNORMAL HIGH (ref 0.61–1.24)
GFR calc Af Amer: >60 mL/min (ref >60)
GFR, EST NON AFRICAN AMERICAN: 54 mL/min — AB (ref >60)
Glucose, Bld: 147 mg/dL — ABNORMAL HIGH (ref 65–99)
POTASSIUM: 4.3 mmol/L (ref 3.5–5.1)
Sodium: 137 mmol/L (ref 135–145)
TOTAL PROTEIN: 6.3 g/dL — AB (ref 6.5–8.1)
Total Bilirubin: 0.7 mg/dL (ref 0.3–1.2)

## 2015-10-01 LAB — PROTIME-INR
INR: 1.06 (ref 0.00–1.49)
Prothrombin Time: 14 seconds (ref 11.6–15.2)

## 2015-10-01 NOTE — ED Provider Notes (Signed)
CSN: [id number]     Arrival date & time 10/01/15  0804 History   First MD Initiated Contact with Patient 10/01/15 [phone]     Chief Complaint  Patient presents with  . left leg pain       The history is provided by the patient.  Patient presents with left leg pain. He's had for last several weeks. His primary care doctor is at the [place] and he has had what sounds like an ABI that showed he had a blockage. He does not know where or what the ABI was. Patient states he can walk much less than he was able to do before. States she's now having pain at night after a day of doing some walking. States that if he walked briskly into the room from outside would be hurting now. He states is worse over last couple weeks and it was before. No chest pain. He is on pradaxa. Patient states the pain is gotten worse he was told he cannot get in to see a vascular surgeon to the [place] until the end of July..  Past Medical History  Diagnosis Date  . CAD (coronary artery disease)     Previous stents last 6 years ago [place] [facility]  . Elevated lipids   . Hypertension   . Angina   . Myocardial infarction [facility]) ~ 2006    "blood work"  . Peripheral vascular disease ([place])   . Dry cough   . Shortness of breath 04/13/11    "mostly w/exertion"  . Complication of anesthesia   . PONV (postoperative nausea and vomiting)    Past Surgical History  Procedure Laterality Date  . Tricep surgery  ~ 2007    "right arm; tore all my muscles @ my elbow"  . Tonsillectomy      "when I was a young one"  . 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 Coronary angioplasty with stent placement  ~ 2004; ~ 2006; 04/13/11    "# of stents 1 (i2004);+2 (2006)+ 1 (04/13/11)= 4 total"  . Left heart catheterization with coronary angiogram N/A 04/13/2011    Procedure: LEFT HEART CATHETERIZATION WITH CORONARY ANGIOGRAM;  Surgeon: Josue Hector, MD;  Location: [facility] CATH LAB;  Service: Cardiovascular;  Laterality: N/A;  . Percutaneous  coronary stent intervention (pci-s) Right 04/13/2011    Procedure: PERCUTANEOUS CORONARY STENT INTERVENTION (PCI-S);  Surgeon: Burnell Blanks, MD;  Location: [facility] CATH LAB;  Service: Cardiovascular;  Laterality: Right;  . Left heart catheterization with coronary angiogram N/A 06/20/2013    Procedure: LEFT HEART CATHETERIZATION WITH CORONARY ANGIOGRAM;  Surgeon: Leonie Man, MD;  Location: [facility] CATH LAB;  Service: Cardiovascular;  Laterality: N/A;   Family History  Problem Relation Age of Onset  . Heart attack Paternal Grandfather    Social History  Substance Use Topics  . Smoking status: Former Smoker -- 1.00 packs/day for 12 years    Types: Cigarettes  . Smokeless tobacco: None     Comment: "quit smoking cigarettes 1969"  . Alcohol Use: Yes     Comment: "occasionally I'll have a beer; last time Summer 2012; prior to that was ~2008""    Review of Systems  Constitutional: Negative for activity change and appetite change.  Eyes: Negative for pain.  Respiratory: Positive for shortness of breath. Negative for chest tightness.   Cardiovascular: Negative for chest pain and leg swelling.  Gastrointestinal: Negative for nausea, abdominal pain and diarrhea.  Genitourinary: Negative for flank pain.  Musculoskeletal: Positive for gait problem. Negative for back pain and neck stiffness.  Skin: Negative for rash.  Neurological: Negative for weakness and headaches.      Allergies  Review of patient's allergies indicates no known allergies.  Home Medications   Prior to Admission medications   Medication Sig Start Date End Date Taking? Authorizing Provider  atorvastatin (LIPITOR) 80 MG tablet Take 1 tablet (80 mg total) by mouth every evening. 06/22/13  Yes Rhonda G Barrett, PA-C  chlorthalidone (HYGROTON) 25 MG tablet Take 12.5 mg by mouth daily.   Yes Historical Provider, MD  dabigatran (PRADAXA) 150 MG CAPS capsule Take 150 mg by mouth 2 (two) times daily.   Yes Historical  Provider, MD  diphenoxylate-atropine (LOMOTIL) 2.5-0.025 MG tablet Take 1 tablet by mouth daily as needed for diarrhea or loose stools.   Yes Historical Provider, MD  enalapril (VASOTEC) 20 MG tablet Take 1 tablet (20 mg total) by mouth 2 (two) times daily. 06/22/13  Yes Rhonda G Barrett, PA-C  isosorbide mononitrate (IMDUR) 30 MG 24 hr tablet Take 30 mg by mouth 2 (two) times daily.   Yes Historical Provider, MD  metoprolol (TOPROL-XL) 200 MG 24 hr tablet Take 100 mg by mouth daily.   Yes Historical Provider, MD  niacin (NIASPAN) 750 MG CR tablet Take 750 mg by mouth at bedtime.     Yes Historical Provider, MD  NIFEdipine (ADALAT CC) 90 MG 24 hr tablet Take 90 mg by mouth every evening.   Yes Historical Provider, MD  nitroGLYCERIN (NITROSTAT) 0.4 MG SL tablet Place 1 tablet (0.4 mg total) under the tongue every 5 (five) minutes as needed for chest pain. 06/22/13  Yes Rhonda G Barrett, PA-C  Omega-3 Fatty Acids (FISH OIL) 500 MG CAPS Take 1,000 mg by mouth every evening.   Yes Historical Provider, MD  OVER THE COUNTER MEDICATION Take 1 capsule by mouth daily. "Super Beta Prostate" supplement   Yes Historical Provider, MD  Ticagrelor (BRILINTA) 90 MG TABS tablet Take 1 tablet (90 mg total) by mouth 2 (two) times daily. Patient not taking: Reported on 10/01/2015 06/22/13   Evelene Croon Barrett, PA-C   BP 149/70 mmHg  Pulse 57  Temp(Src) 97.7 F (36.5 C) (Oral)  Resp 16  Ht 5\' 8"  (1.727 m)  Wt 210 lb (95.255 kg)  BMI 31.94 kg/m2  SpO2 98% Physical Exam  Constitutional: He appears well-developed.  HENT:  Head: Atraumatic.  Neck: Thyromegaly present.  Cardiovascular: Normal rate.   Pulmonary/Chest: Effort normal.  Abdominal: Soft.  Musculoskeletal: He exhibits no tenderness.  On left Strong femoral pulse. Weak palpable popliteal pulse. Possibly palpable dorsalis pedis pulse but does have a good Doppler signal. Strong pulses on right side.  Neurological: He is alert.  Skin: No erythema.  Left foot  cooler than contralateral. Nontender.  Psychiatric: He has a normal mood and affect.    ED Course  Procedures (including critical care time) Labs Review Labs Reviewed  COMPREHENSIVE METABOLIC PANEL - Abnormal; Notable for the following:    Chloride 99 (*)    Glucose, Bld 147 (*)    Creatinine, Ser 1.26 (*)    Total Protein 6.3 (*)    GFR calc non Af Amer 54 (*)    All other components within normal limits  CBC WITH DIFFERENTIAL/PLATELET - Abnormal; Notable for the following:    HCT 37.3 (*)    All other components within normal limits  PROTIME-INR    Imaging Review No results found. I have  personally reviewed and evaluated these images and lab results as part of my medical decision-making.   EKG Interpretation None      MDM   Final diagnoses:  Claudication of left lower extremity ([place])     Patient with claudication a left lower extremity and possible rest pain. He does however have an ABI of 0.74. Discussed with Dr. Oneida Alar. Will see in the office in follow-up in one week. Patient is artery on anticoagulation.    Davonna Belling, MD 10/01/15 1536

## 2015-10-01 NOTE — Progress Notes (Signed)
VASCULAR LAB PRELIMINARY  ARTERIAL  ABI completed:    RIGHT    LEFT    PRESSURE WAVEFORM  PRESSURE WAVEFORM  BRACHIAL 149 Triphasic  BRACHIAL 164 Triphasic  DP 157 Triphasic  DP 122 Monophasic   AT   AT    PT 159 Triphasic  PT 115 Monophasic   PER   PER    GREAT TOE 96 NA GREAT TOE 69 NA    RIGHT LEFT  ABI 0.97 0.74  TBI 0.59 0.42     Miliana Gangwer, RVT 10/01/2015, 1:32 PM

## 2015-10-01 NOTE — ED Notes (Signed)
Patient here with left lower leg pain x 2 months. Reports that he went to [place] and they told him "you have blockage in leg" and is scheduled for surgery in July and he cant wait that long due to the pain

## 2015-10-01 NOTE — Telephone Encounter (Signed)
-----   Message from Mena Goes, RN sent at 10/01/2015  3:17 PM EDT ----- Regarding: schedule   ----- Message -----    From: Elam Dutch, MD    Sent: 10/01/2015   2:39 PM      To: [facility] Charge Pool  Pt seen in ER today by ER doc He has claudication ABI have been done He needs appt with me in a few weeks  Ruta Hinds

## 2015-10-01 NOTE — ED Notes (Signed)
This RN spoke with Sharee Pimple in [place] who reports test will be done at bedside in approx 15 minutes.  Dr. Alvino Chapel made aware.

## 2015-10-01 NOTE — Telephone Encounter (Signed)
sched appt 5/10 at 10:30. Spoke to pt to inform him of appt.

## 2015-10-01 NOTE — Discharge Instructions (Signed)
Ankle-Brachial Index Test The ankle-brachial index (ABI) test is used to find peripheral vascular disease (PVD). PVD is also known as peripheral arterial disease (PAD). PVD is the blocking or hardening of the arteries anywhere within the circulatory system beyond the heart. PVD is caused by cholesterol deposits in your blood vessels (atherosclerosis). These deposits cause arteries to narrow. The delivery of oxygen to your tissues is impaired as a result. This can cause muscle pain and fatigue. This is called claudication. PVD means there may also be buildup of cholesterol in your:  Heart. This increases the risk of heart attacks.  Brain. This increases the risk of strokes. The ankle-brachial index test measures the blood flow in your arms and legs. This test also determines if blood vessels in your leg are narrowed by cholesterol deposits. There are additional causes of a reduced ankle-brachial index, such as inflammation of vessels or a clot in the vessels. However, these are much less common than narrowing due to cholesterol deposits. HOW IS THE ANKLE-BRACHIAL INDEX TEST DONE? The test is done while you are lying down and resting. Measurements are taken of the systolic pressure:  In your arm (brachial).  In your ankle at several points along your leg. Systolic pressure is the pressure inside your arteries when your heart pumps. The measurements are taken several times on both sides. Then, the highest systolic pressure of the ankle is divided by the highest brachial systolic pressure. The result is the ankle-brachial pressure ratio, or ABI. Sometimes this test is repeated after you have exercised on a treadmill for five minutes. You may have leg pain during the exercise portion of the test if you suffer from PAD. If the index number drops after exercise, this may show that PAD is present. A normal ABI ratio is between 0.9 and 1.4. A value below 0.9 is considered abnormal.    This information is  not intended to replace advice given to you by your health care provider. Make sure you discuss any questions you have with your health care provider.   Document Released: 05/22/2004 Document Revised: 06/08/2014 Document Reviewed: 12/22/2013 Elsevier Interactive Patient Education 2016 Elsevier Inc.  Peripheral Vascular Disease Peripheral vascular disease (PVD) is a disease of the blood vessels that are not part of your heart and brain. A simple term for PVD is poor circulation. In most cases, PVD narrows the blood vessels that carry blood from your heart to the rest of your body. This can result in a decreased supply of blood to your arms, legs, and internal organs, like your stomach or kidneys. However, it most often affects a person's lower legs and feet. There are two types of PVD.  Organic PVD. This is the more common type. It is caused by damage to the structure of blood vessels.  Functional PVD. This is caused by conditions that make blood vessels contract and tighten (spasm). Without treatment, PVD tends to get worse over time. PVD can also lead to acute ischemic limb. This is when an arm or limb suddenly has trouble getting enough blood. This is a medical emergency. CAUSES Each type of PVD has many different causes. The most common cause of PVD is buildup of a fatty material (plaque) inside of your arteries (atherosclerosis). Small amounts of plaque can break off from the walls of the blood vessels and become lodged in a smaller artery. This blocks blood flow and can cause acute ischemic limb. Other common causes of PVD include:  Blood clots that form inside  of blood vessels.  Injuries to blood vessels.  Diseases that cause inflammation of blood vessels or cause blood vessel spasms.  Health behaviors and health history that increase your risk of developing PVD. RISK FACTORS  You may have a greater risk of PVD if you:  Have a family history of PVD.  Have certain medical  conditions, including:  High cholesterol.  Diabetes.  High blood pressure (hypertension).  Coronary heart disease.  Past problems with blood clots.  Past injury, such as burns or a broken bone. These may have damaged blood vessels in your limbs.  Buerger disease. This is caused by inflamed blood vessels in your hands and feet.  Some forms of arthritis.  Rare birth defects that affect the arteries in your legs.  Use tobacco.  Do not get enough exercise.  Are obese.  Are age 1 or older. SIGNS AND SYMPTOMS  PVD may cause many different symptoms. Your symptoms depend on what part of your body is not getting enough blood. Some common signs and symptoms include:  Cramps in your lower legs. This may be a symptom of poor leg circulation (claudication).  Pain and weakness in your legs while you are physically active that goes away when you rest (intermittent claudication).  Leg pain when at rest.  Leg numbness, tingling, or weakness.  Coldness in a leg or foot, especially when compared with the other leg.  Skin or hair changes. These can include:  Hair loss.  Shiny skin.  Pale or bluish skin.  Thick toenails.  Inability to get or maintain an erection (erectile dysfunction). People with PVD are more prone to developing ulcers and sores on their toes, feet, or legs. These may take longer than normal to heal. DIAGNOSIS Your health care provider may diagnose PVD from your signs and symptoms. The health care provider will also do a physical exam. You may have tests to find out what is causing your PVD and determine its severity. Tests may include:  Blood pressure recordings from your arms and legs and measurements of the strength of your pulses (pulse volume recordings).  Imaging studies using sound waves to take pictures of the blood flow through your blood vessels (Doppler ultrasound).  Injecting a dye into your blood vessels before having imaging studies  using:  X-rays (angiogram or arteriogram).  Computer-generated X-rays (CT angiogram).  A powerful electromagnetic field and a computer (magnetic resonance angiogram or MRA). TREATMENT Treatment for PVD depends on the cause of your condition and the severity of your symptoms. It also depends on your age. Underlying causes need to be treated and controlled. These include long-lasting (chronic) conditions, such as diabetes, high cholesterol, and high blood pressure. You may need to first try making lifestyle changes and taking medicines. Surgery may be needed if these do not work. Lifestyle changes may include:  Quitting smoking.  Exercising regularly.  Following a low-fat, low-cholesterol diet. Medicines may include:  Blood thinners to prevent blood clots.  Medicines to improve blood flow.  Medicines to improve your blood cholesterol levels. Surgical procedures may include:  A procedure that uses an inflated balloon to open a blocked artery and improve blood flow (angioplasty).  A procedure to put in a tube (stent) to keep a blocked artery open (stent implant).  Surgery to reroute blood flow around a blocked artery (peripheral bypass surgery).  Surgery to remove dead tissue from an infected wound on the affected limb.  Amputation. This is surgical removal of the affected limb. This may  be necessary in cases of acute ischemic limb that are not improved through medical or surgical treatments. HOME CARE INSTRUCTIONS  Take medicines only as directed by your health care provider.  Do not use any tobacco products, including cigarettes, chewing tobacco, or electronic cigarettes. If you need help quitting, ask your health care provider.  Lose weight if you are overweight, and maintain a healthy weight as directed by your health care provider.  Eat a diet that is low in fat and cholesterol. If you need help, ask your health care provider.  Exercise regularly. Ask your health care  provider to suggest some good activities for you.  Use compression stockings or other mechanical devices as directed by your health care provider.  Take good care of your feet.  Wear comfortable shoes that fit well.  Check your feet often for any cuts or sores. SEEK MEDICAL CARE IF:  You have cramps in your legs while walking.  You have leg pain when you are at rest.  You have coldness in a leg or foot.  Your skin changes.  You have erectile dysfunction.  You have cuts or sores on your feet that are not healing. SEEK IMMEDIATE MEDICAL CARE IF:  Your arm or leg turns cold and blue.  Your arms or legs become red, warm, swollen, painful, or numb.  You have chest pain or trouble breathing.  You suddenly have weakness in your face, arm, or leg.  You become very confused or lose the ability to speak.  You suddenly have a very bad headache or lose your vision.   This information is not intended to replace advice given to you by your health care provider. Make sure you discuss any questions you have with your health care provider.   Document Released: 06/25/2004 Document Revised: 06/08/2014 Document Reviewed: 10/26/2013 Elsevier Interactive Patient Education [employer].

## 2015-10-04 ENCOUNTER — Encounter: Payer: Self-pay | Admitting: Vascular Surgery

## 2015-10-09 ENCOUNTER — Ambulatory Visit (INDEPENDENT_AMBULATORY_CARE_PROVIDER_SITE_OTHER): Payer: No Typology Code available for payment source | Admitting: Vascular Surgery

## 2015-10-09 ENCOUNTER — Encounter: Payer: Self-pay | Admitting: *Deleted

## 2015-10-09 ENCOUNTER — Encounter: Payer: Self-pay | Admitting: Vascular Surgery

## 2015-10-09 VITALS — BP 132/71 | HR 77 | Temp 97.4°F | Resp 18 | Ht 68.0 in | Wt 213.0 lb

## 2015-10-09 DIAGNOSIS — I739 Peripheral vascular disease, unspecified: Secondary | ICD-10-CM | POA: Diagnosis not present

## 2015-10-09 NOTE — Progress Notes (Signed)
Patient name: Raymond Jacobson MRN: [id number] DOB: [birth year] Sex: male  REASON FOR CONSULT: Left leg claudication  HPI: Raymond Jacobson is a 75 y.o. male,  who has a 4-5 month history of pain that radiates from his left foot up to his left hip after walking about a quarter mile. After resting for several minutes this dissipates. He denies rest pain. No history of nonhealing wounds. He denies history of diabetes. He does occasionally get some angina. He does not get angina at rest. Other medical problems include coronary artery disease, hyperlipidemia, hypertension which are currently stable. He is a former smoker but quit almost 40 years ago. He states he is on Pradaxa for his coronary stents. His medication list had Arby Barrette to listed that he states he is not taking this. He states he has several drug-eluting stents. Patient is very frustrated that he can no longer perform the activities that he enjoys such as artery competitions and pickle ball. He states that he believes his quality of life is diminished secondary to this.  Past Medical History  Diagnosis Date  . CAD (coronary artery disease)     Previous stents last 6 years ago [place]  . Elevated lipids   . Hypertension   . Angina   . Myocardial infarction [facility]) ~ 2006    "blood work"  . Peripheral vascular disease ([place])   . Dry cough   . Shortness of breath 04/13/11    "mostly w/exertion"  . Complication of anesthesia   . PONV (postoperative nausea and vomiting)    Past Surgical History  Procedure Laterality Date  . Tricep surgery  ~ 2007    "right arm; tore all my muscles @ my elbow"  . Tonsillectomy      "when I was a young one"  . 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 Coronary angioplasty with stent placement  ~ 2004; ~ 2006; 04/13/11    "# of stents 1 (i2004);+2 (2006)+ 1 (04/13/11)= 4 total"  . Left heart catheterization with coronary angiogram N/A 04/13/2011    Procedure: LEFT HEART CATHETERIZATION WITH  CORONARY ANGIOGRAM;  Surgeon: Josue Hector, MD;  Location: [facility] CATH LAB;  Service: Cardiovascular;  Laterality: N/A;  . Percutaneous coronary stent intervention (pci-s) Right 04/13/2011    Procedure: PERCUTANEOUS CORONARY STENT INTERVENTION (PCI-S);  Surgeon: Burnell Blanks, MD;  Location: [facility] CATH LAB;  Service: Cardiovascular;  Laterality: Right;  . Left heart catheterization with coronary angiogram N/A 06/20/2013    Procedure: LEFT HEART CATHETERIZATION WITH CORONARY ANGIOGRAM;  Surgeon: Leonie Man, MD;  Location: [facility] CATH LAB;  Service: Cardiovascular;  Laterality: N/A;    Family History  Problem Relation Age of Onset  . Heart attack Paternal Grandfather   . Cancer Father     SOCIAL HISTORY: Social History   Social History  . Marital Status: Unknown    Spouse Name: N/A  . Number of Children: N/A  . Years of Education: N/A   Occupational History  . Retired    Social History Main Topics  . Smoking status: Former Smoker -- 1.00 packs/day for 12 years    Types: Cigarettes  . Smokeless tobacco: Never Used     Comment: "quit smoking cigarettes 1969"  . Alcohol Use: 0.0 oz/week    0 Standard drinks or equivalent per week     Comment: "occasionally I'll have a beer; last time Summer 2012; prior to that was ~2008""  . Drug Use:  No  . Sexual Activity: Not on file   Other Topics Concern  . Not on file   Social History Narrative   Married   [place]   Retired from [employer] smoking in 96   Sedentary    No Known Allergies Current Outpatient Prescriptions on File Prior to Visit  Medication Sig Dispense Refill  . atorvastatin (LIPITOR) 80 MG tablet Take 1 tablet (80 mg total) by mouth every evening. 30 tablet 11  . chlorthalidone (HYGROTON) 25 MG tablet Take 12.5 mg by mouth daily.    . dabigatran (PRADAXA) 150 MG CAPS capsule Take 150 mg by mouth 2 (two) times daily.    . diphenoxylate-atropine (LOMOTIL) 2.5-0.025 MG tablet Take 1 tablet by  mouth daily as needed for diarrhea or loose stools.    . enalapril (VASOTEC) 20 MG tablet Take 1 tablet (20 mg total) by mouth 2 (two) times daily. 60 tablet 11  . isosorbide mononitrate (IMDUR) 30 MG 24 hr tablet Take 30 mg by mouth 2 (two) times daily.    . metoprolol (TOPROL-XL) 200 MG 24 hr tablet Take 100 mg by mouth daily.    . niacin (NIASPAN) 750 MG CR tablet Take 750 mg by mouth at bedtime.      Marland Kitchen NIFEdipine (ADALAT CC) 90 MG 24 hr tablet Take 90 mg by mouth every evening.    . nitroGLYCERIN (NITROSTAT) 0.4 MG SL tablet Place 1 tablet (0.4 mg total) under the tongue every 5 (five) minutes as needed for chest pain. 25 tablet 12  . Omega-3 Fatty Acids (FISH OIL) 500 MG CAPS Take 1,000 mg by mouth every evening.    Marland Kitchen OVER THE COUNTER MEDICATION Take 1 capsule by mouth daily. "Super Beta Prostate" supplement     No current facility-administered medications on file prior to visit.    ROS:   General:  No weight loss, Fever, chills  HEENT: No recent headaches, no nasal bleeding, no visual changes, no sore throat  Neurologic: No dizziness, blackouts, seizures. No recent symptoms of stroke or mini- stroke. No recent episodes of slurred speech, or temporary blindness.  Cardiac: No recent episodes of chest pain/pressure, no shortness of breath at rest.  No shortness of breath with exertion.  Denies history of atrial fibrillation or irregular heartbeat  Vascular: No history of rest pain in feet.  No history of claudication.  No history of non-healing ulcer, No history of DVT   Pulmonary: No home oxygen, no productive cough, no hemoptysis,  No asthma or wheezing  Musculoskeletal:  [ ]  Arthritis, [ ]  Low back pain,  [ ]  Joint pain  Hematologic:No history of hypercoagulable state.  No history of easy bleeding.  No history of anemia  Gastrointestinal: No hematochezia or melena,  No gastroesophageal reflux, no trouble swallowing  Urinary: [ ]  chronic Kidney disease, [ ]  on HD - [ ]  MWF or [  ] TTHS, [ ]  Burning with urination, [ ]  Frequent urination, [ ]  Difficulty urinating;   Skin: No rashes  Psychological: No history of anxiety,  No history of depression   Physical Examination  Filed Vitals:   10/09/15 1026  BP: 132/71  Pulse: 77  Temp: 97.4 F (36.3 C)  Resp: 18  Height: 5\' 8"  (1.727 m)  Weight: 213 lb (96.616 kg)  SpO2: 99%    Body mass index is 32.39 kg/(m^2).  General:  Alert and oriented, no acute distress HEENT: Normal Neck: No bruit or JVD Pulmonary: Clear  to auscultation bilaterally Cardiac: Regular Rate and Rhythm without murmur Abdomen: Soft, non-tender, non-distended, no mass Skin: No rash, multiple scattered varicosities over course of greater saphenous Extremity Pulses:  2+ radial, brachial, 2+ right femoral 1+ left femoral, 2+ right dorsalis pedis, absent left popliteal and pedal pulses Musculoskeletal: No deformity or edema  Neurologic: Upper and lower extremity motor 5/5 and symmetric  DATA:  Patient had bilateral ABIs performed at [facility] on 10/01/2015. ABI on the right was 0.97 left was 0.74  ASSESSMENT:  Left lower extremity claudication lifestyle limiting   PLAN:  Risks benefits possible palpitations and procedure details of arteriogram lower extremity runoff possible intervention were discussed with the patient today. These include but are not limited to bleeding infection vessel injury contrast reaction. He understands and agrees to proceed. We will stop his Pradaxa 3 days prior to the procedure.   Ruta Hinds, MD [facility] Office: [phone] Pager: [phone]

## 2015-10-10 ENCOUNTER — Other Ambulatory Visit: Payer: Self-pay

## 2015-10-15 ENCOUNTER — Encounter (HOSPITAL_COMMUNITY): Payer: Self-pay | Admitting: General Practice

## 2015-10-15 ENCOUNTER — Ambulatory Visit (HOSPITAL_COMMUNITY)
Admission: RE | Admit: 2015-10-15 | Discharge: 2015-10-16 | Disposition: A | Discharge status: Home / Self Care | Payer: No Typology Code available for payment source | Source: Ambulatory Visit | Attending: Surgery | Admitting: Surgery

## 2015-10-15 ENCOUNTER — Encounter (HOSPITAL_COMMUNITY)
Admission: RE | Disposition: A | Discharge status: Home / Self Care | Payer: Self-pay | Source: Ambulatory Visit | Attending: Surgery

## 2015-10-15 DIAGNOSIS — Z955 Presence of coronary angioplasty implant and graft: Secondary | ICD-10-CM | POA: Diagnosis not present

## 2015-10-15 DIAGNOSIS — Z87891 Personal history of nicotine dependence: Secondary | ICD-10-CM | POA: Diagnosis not present

## 2015-10-15 DIAGNOSIS — I70212 Atherosclerosis of native arteries of extremities with intermittent claudication, left leg: Secondary | ICD-10-CM

## 2015-10-15 DIAGNOSIS — Z79899 Other long term (current) drug therapy: Secondary | ICD-10-CM | POA: Diagnosis not present

## 2015-10-15 DIAGNOSIS — I10 Essential (primary) hypertension: Secondary | ICD-10-CM | POA: Insufficient documentation

## 2015-10-15 DIAGNOSIS — I739 Peripheral vascular disease, unspecified: Secondary | ICD-10-CM | POA: Diagnosis present

## 2015-10-15 DIAGNOSIS — Z7901 Long term (current) use of anticoagulants: Secondary | ICD-10-CM | POA: Diagnosis not present

## 2015-10-15 DIAGNOSIS — I2510 Atherosclerotic heart disease of native coronary artery without angina pectoris: Secondary | ICD-10-CM | POA: Diagnosis not present

## 2015-10-15 DIAGNOSIS — I252 Old myocardial infarction: Secondary | ICD-10-CM | POA: Diagnosis not present

## 2015-10-15 HISTORY — DX: Basal cell carcinoma of skin of scalp and neck: C44.41

## 2015-10-15 HISTORY — PX: FEMORAL ARTERY STENT: SHX1583

## 2015-10-15 HISTORY — PX: PERIPHERAL VASCULAR CATHETERIZATION: SHX172C

## 2015-10-15 HISTORY — DX: Unspecified osteoarthritis, unspecified site: M19.90

## 2015-10-15 HISTORY — DX: Pure hypercholesterolemia, unspecified: E78.00

## 2015-10-15 HISTORY — DX: Acute embolism and thrombosis of unspecified deep veins of unspecified lower extremity: I82.409

## 2015-10-15 LAB — POCT I-STAT, CHEM 8
BUN: 21 mg/dL — ABNORMAL HIGH (ref 6–20)
Calcium, Ion: 1.18 mmol/L (ref 1.13–1.30)
Chloride: 99 mmol/L — ABNORMAL LOW (ref 101–111)
Creatinine, Ser: 1.1 mg/dL (ref 0.61–1.24)
Glucose, Bld: 173 mg/dL — ABNORMAL HIGH (ref 65–99)
HCT: 38 % — ABNORMAL LOW (ref 39.0–52.0)
Hemoglobin: 12.9 g/dL — ABNORMAL LOW (ref 13.0–17.0)
Potassium: 3.7 mmol/L (ref 3.5–5.1)
Sodium: 137 mmol/L (ref 135–145)
TCO2: 25 mmol/L (ref 0–100)

## 2015-10-15 LAB — POCT ACTIVATED CLOTTING TIME
ACTIVATED CLOTTING TIME: 219 s
Activated Clotting Time: 157 seconds

## 2015-10-15 SURGERY — ABDOMINAL AORTOGRAM W/LOWER EXTREMITY

## 2015-10-15 MED ORDER — GUAIFENESIN-DM 100-10 MG/5ML PO SYRP: 15.0000 mL | ORAL_SOLUTION | ORAL | Status: DC | PRN

## 2015-10-15 MED ORDER — FENTANYL CITRATE (PF) 100 MCG/2ML IJ SOLN
INTRAMUSCULAR | Status: AC
  Filled 2015-10-15: qty 2

## 2015-10-15 MED ORDER — IODIXANOL 320 MG/ML IV SOLN
INTRAVENOUS | Status: DC | PRN
  Administered 2015-10-15: 155 mL via INTRA_ARTERIAL

## 2015-10-15 MED ORDER — OXYCODONE-ACETAMINOPHEN 5-325 MG PO TABS: 2.0000 | ORAL_TABLET | ORAL | Status: DC | PRN

## 2015-10-15 MED ORDER — MIDAZOLAM HCL 2 MG/2ML IJ SOLN
INTRAMUSCULAR | Status: DC | PRN
  Administered 2015-10-15 (×3): 1 mg via INTRAVENOUS

## 2015-10-15 MED ORDER — ACETAMINOPHEN 325 MG PO TABS: 325.0000 mg | ORAL_TABLET | ORAL | Status: DC | PRN

## 2015-10-15 MED ORDER — HEPARIN (PORCINE) IN NACL 2-0.9 UNIT/ML-% IJ SOLN
INTRAMUSCULAR | Status: DC | PRN
  Administered 2015-10-15: 1000 mL

## 2015-10-15 MED ORDER — PHENOL 1.4 % MT LIQD
1.0000 | OROMUCOSAL | Status: DC | PRN
  Filled 2015-10-15: qty 177

## 2015-10-15 MED ORDER — ACETAMINOPHEN 650 MG RE SUPP: 325.0000 mg | RECTAL | Status: DC | PRN

## 2015-10-15 MED ORDER — LIDOCAINE HCL (PF) 1 % IJ SOLN
INTRAMUSCULAR | Status: AC
  Filled 2015-10-15: qty 30

## 2015-10-15 MED ORDER — MORPHINE SULFATE (PF) 2 MG/ML IV SOLN
2.0000 mg | INTRAVENOUS | Status: DC | PRN
  Administered 2015-10-15 (×2): 2 mg via INTRAVENOUS
  Filled 2015-10-15 (×2): qty 1

## 2015-10-15 MED ORDER — ATROPINE SULFATE 1 MG/10ML IJ SOSY
PREFILLED_SYRINGE | INTRAMUSCULAR | Status: AC
  Filled 2015-10-15: qty 10

## 2015-10-15 MED ORDER — HYDRALAZINE HCL 20 MG/ML IJ SOLN
5.0000 mg | INTRAMUSCULAR | Status: AC | PRN
  Administered 2015-10-15 (×2): 5 mg via INTRAVENOUS
  Filled 2015-10-15 (×2): qty 1

## 2015-10-15 MED ORDER — MIDAZOLAM HCL 2 MG/2ML IJ SOLN
INTRAMUSCULAR | Status: AC
  Filled 2015-10-15: qty 2

## 2015-10-15 MED ORDER — LABETALOL HCL 5 MG/ML IV SOLN
10.0000 mg | INTRAVENOUS | Status: DC | PRN
  Administered 2015-10-15 (×3): 10 mg via INTRAVENOUS
  Filled 2015-10-15 (×3): qty 4

## 2015-10-15 MED ORDER — MORPHINE SULFATE (PF) 10 MG/ML IV SOLN: 2.0000 mg | INTRAVENOUS | Status: DC | PRN

## 2015-10-15 MED ORDER — ONDANSETRON HCL 4 MG/2ML IJ SOLN: 4.0000 mg | Freq: Four times a day (QID) | INTRAMUSCULAR | Status: DC | PRN

## 2015-10-15 MED ORDER — SODIUM CHLORIDE 0.9 % IV SOLN
1.0000 mL/kg/h | INTRAVENOUS | Status: DC
  Administered 2015-10-15: 1 mL/kg/h via INTRAVENOUS

## 2015-10-15 MED ORDER — METOPROLOL TARTRATE 5 MG/5ML IV SOLN: 2.0000 mg | INTRAVENOUS | Status: DC | PRN

## 2015-10-15 MED ORDER — LIDOCAINE HCL (PF) 1 % IJ SOLN
INTRAMUSCULAR | Status: DC | PRN
  Administered 2015-10-15: 20 mL

## 2015-10-15 MED ORDER — ALUM & MAG HYDROXIDE-SIMETH 200-200-20 MG/5ML PO SUSP: 15.0000 mL | ORAL | Status: DC | PRN

## 2015-10-15 MED ORDER — DOCUSATE SODIUM 100 MG PO CAPS: 100.0000 mg | ORAL_CAPSULE | Freq: Every day | ORAL | Status: DC

## 2015-10-15 MED ORDER — HEPARIN SODIUM (PORCINE) 1000 UNIT/ML IJ SOLN
INTRAMUSCULAR | Status: DC | PRN
  Administered 2015-10-15: 10000 [IU] via INTRAVENOUS

## 2015-10-15 MED ORDER — SODIUM CHLORIDE 0.9 % IV SOLN
INTRAVENOUS | Status: DC
  Administered 2015-10-15: 12:00:00 via INTRAVENOUS

## 2015-10-15 MED ORDER — HEPARIN (PORCINE) IN NACL 2-0.9 UNIT/ML-% IJ SOLN
INTRAMUSCULAR | Status: AC
  Filled 2015-10-15: qty 1000

## 2015-10-15 MED ORDER — HEPARIN SODIUM (PORCINE) 1000 UNIT/ML IJ SOLN
INTRAMUSCULAR | Status: AC
  Filled 2015-10-15: qty 1

## 2015-10-15 MED ORDER — FENTANYL CITRATE (PF) 100 MCG/2ML IJ SOLN
INTRAMUSCULAR | Status: DC | PRN
  Administered 2015-10-15 (×3): 25 ug via INTRAVENOUS

## 2015-10-15 SURGICAL SUPPLY — 30 items
BALLN MUSTANG 4X40X135 (BALLOONS) ×3
BALLN MUSTANG 6X60X135 (BALLOONS) ×3
BALLOON MUSTANG 4X40X135 (BALLOONS) ×1 IMPLANT
BALLOON MUSTANG 6X60X135 (BALLOONS) ×1 IMPLANT
CATH OMNI FLUSH 5F 65CM (CATHETERS) ×3 IMPLANT
CATH QUICKCROSS .035X135CM (MICROCATHETER) ×3 IMPLANT
CATH SOFT-VU 4F 65 STRAIGHT (CATHETERS) ×1 IMPLANT
CATH SOFT-VU STRAIGHT 4F 65CM (CATHETERS) ×2
COVER PRB 48X5XTLSCP FOLD TPE (BAG) ×1 IMPLANT
COVER PROBE 5X48 (BAG) ×2
DEVICE TORQUE H2O (MISCELLANEOUS) ×3 IMPLANT
DRAPE ZERO GRAVITY STERILE (DRAPES) ×3 IMPLANT
GUIDEWIRE ANGLED .035X260CM (WIRE) ×3 IMPLANT
KIT ENCORE 26 ADVANTAGE (KITS) ×3 IMPLANT
KIT MICROINTRODUCER STIFF 5F (SHEATH) ×3 IMPLANT
KIT PV (KITS) ×3 IMPLANT
SHEATH FLEXOR ANSEL 1 7F 45CM (SHEATH) ×3 IMPLANT
SHEATH PINNACLE 5F 10CM (SHEATH) ×3 IMPLANT
SHEATH PINNACLE 7F 10CM (SHEATH) ×3 IMPLANT
SHIELD RADPAD SCOOP 12X17 (MISCELLANEOUS) ×3 IMPLANT
STENT INNOVA 7X60X130 (Permanent Stent) ×3 IMPLANT
STOPCOCK MORSE 400PSI 3WAY (MISCELLANEOUS) ×3 IMPLANT
SYR MEDRAD MARK V 150ML (SYRINGE) ×3 IMPLANT
TAPE RADIOPAQUE TURBO (MISCELLANEOUS) ×3 IMPLANT
TRANSDUCER W/STOPCOCK (MISCELLANEOUS) ×3 IMPLANT
TRAY PV CATH (CUSTOM PROCEDURE TRAY) ×3 IMPLANT
TUBING CIL FLEX 10 FLL-RA (TUBING) ×3 IMPLANT
WIRE BENTSON .035X145CM (WIRE) ×3 IMPLANT
WIRE HI TORQ VERSACORE J 260CM (WIRE) ×3 IMPLANT
WIRE ROSEN-J .035X180CM (WIRE) ×3 IMPLANT

## 2015-10-15 NOTE — Progress Notes (Signed)
Site area: right groin  Site Prior to Removal:  Level 0  Pressure Applied For 25 MINUTES    Minutes Beginning at 21;05  Manual:   Yes.    Patient Status During Pull:  WNL  Post Pull Groin Site:  Level 0  Post Pull Instructions Given:  yes  Post Pull Pulses Present:  yes  Dressing Applied:  yes Comments:  Pt tolerated well

## 2015-10-15 NOTE — Op Note (Signed)
Patient name: Raymond Jacobson MRN: [id number] DOB: [birth year] Sex: male  10/15/2015 Pre-operative Diagnosis: Left leg claudication Post-operative diagnosis:  Same Surgeon:  Annamarie Major Procedure Performed:  1.  Ultrasound-guided access, right femoral artery  2.  Abdominal aortogram  3.  Left lower extremity runoff  4.  Stent, left superficial femoral artery  5.  Conscious sedation (15:19-16:46)   Indications:  This is a 75 year old gentleman with lifestyle limiting claudication in the left leg.  He comes in today for angiographic evaluation and possible intervention  Procedure:  The patient was identified in the holding area and taken to room 8.  The patient was then placed supine on the table and prepped and draped in the usual sterile fashion.  A time out was called.  Conscious sedation was performed with the use of IV fentanyl and Versed under continuous monitoring by myself, the attending physician and the circulating nurse.  Heart rate and blood pressure and oxygen saturations were continuously monitored.  We are present for the duration of the procedure.  Ultrasound was used to evaluate the right common femoral artery.  It was patent .  A digital ultrasound image was acquired.  A micropuncture needle was used to access the right common femoral artery under ultrasound guidance.  An 018 wire was advanced without resistance and a micropuncture sheath was placed.  The 018 wire was removed and a benson wire was placed.  The micropuncture sheath was exchanged for a 5 french sheath.  An omniflush catheter was advanced over the wire to the level of L-1.  An abdominal angiogram was obtained.  Next, using the omniflush catheter and a benson wire, the aortic bifurcation was crossed and the catheter was placed into theleft external iliac artery and left runoff was obtained.    Findings:   Aortogram:  Bilateral renal arteries are widely patent.  The infrarenal abdominal aorta is widely patent.   Bilateral common and external iliac arteries are widely patent  Right Lower Extremity:  Not evaluated  Left Lower Extremity:  The left common femoral and profunda femoral artery are widely patent.  The superficial femoral artery has a short segment occlusion just proximal to the adductor canal for proximally 4 cm.  There is reconstitution of the above-knee popliteal artery.  The popliteal artery is patent throughout it's course with 2 vessel runoff via the peroneal artery which is the dominant runoff in the anterior tibial.  Intervention:  After the above images were acquired the decision was made to proceed with intervention.  A 7 French sheath was advanced over a Rosen wire into the left common femoral artery.  The patient was fully heparinized.  A 035 Glidewire and a quick cross were used to preform subintimal recanalization of the stenosis.  Reentry was confirmed by contrast injection with the catheter in the popliteal artery.  A 035 Wholey wire was inserted.  A 4 x 40 balloon was used to predilate the subintimal tract.  I then selected and ANOVA 7 x 60 stent and perform primary stenting of the lesion.  This was postdilated with a 6 x 40 balloon.  Completion imaging revealed resolution of the stenosis within the superficial femoral artery and no significant change in the runoff.  At this point, catheters and wires were removed.  The patient be taken to the holding area for sheath pull once the coagulation profile corrects.  The right leg was not imaged given the amount of contrast utilize for the intervention.  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 Annamarie Major, M.D. [facility] Office: [phone] Pager:  [phone]

## 2015-10-15 NOTE — Interval H&P Note (Signed)
History and Physical Interval Note:  10/15/2015 3:03 PM  Raymond Jacobson  has presented today for surgery, with the diagnosis of left leg claudication  The various methods of treatment have been discussed with the patient and family. After consideration of risks, benefits and other options for treatment, the patient has consented to  Procedure(s): Abdominal Aortogram w/Lower Extremity (N/A) as a surgical intervention .  The patient's history has been reviewed, patient examined, no change in status, stable for surgery.  I have reviewed the patient's chart and labs.  Questions were answered to the patient's satisfaction.     Annamarie Major  CV:RRR PULM:CTA Neuro intact Plan angio with intervention if indicated  [place] BRabahm

## 2015-10-15 NOTE — H&P (View-Only) (Signed)
Patient name: Raymond Jacobson MRN: [id number] DOB: 1940/07/27 Sex: male  REASON FOR CONSULT: Left leg claudication  HPI: Raymond Jacobson is a 75 y.o. male,  who has a 4-5 month history of pain that radiates from his left foot up to his left hip after walking about a quarter mile. After resting for several minutes this dissipates. He denies rest pain. No history of nonhealing wounds. He denies history of diabetes. He does occasionally get some angina. He does not get angina at rest. Other medical problems include coronary artery disease, hyperlipidemia, hypertension which are currently stable. He is a former smoker but quit almost 40 years ago. He states he is on Pradaxa for his coronary stents. His medication list had Arby Barrette to listed that he states he is not taking this. He states he has several drug-eluting stents. Patient is very frustrated that he can no longer perform the activities that he enjoys such as artery competitions and pickle ball. He states that he believes his quality of life is diminished secondary to this.  Past Medical History  Diagnosis Date  . CAD (coronary artery disease)     Previous stents last 6 years ago [place] [facility]  . Elevated lipids   . Hypertension   . Angina   . Myocardial infarction [facility]) ~ 2006    "blood work"  . Peripheral vascular disease ([place])   . Dry cough   . Shortness of breath 04/13/11    "mostly w/exertion"  . Complication of anesthesia   . PONV (postoperative nausea and vomiting)    Past Surgical History  Procedure Laterality Date  . Tricep surgery  ~ 2007    "right arm; tore all my muscles @ my elbow"  . Tonsillectomy      "when I was a young one"  . 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 Coronary angioplasty with stent placement  ~ 2004; ~ 2006; 04/13/11    "# of stents 1 (i2004);+2 (2006)+ 1 (04/13/11)= 4 total"  . Left heart catheterization with coronary angiogram N/A 04/13/2011    Procedure: LEFT HEART CATHETERIZATION WITH  CORONARY ANGIOGRAM;  Surgeon: Josue Hector, MD;  Location: [facility] CATH LAB;  Service: Cardiovascular;  Laterality: N/A;  . Percutaneous coronary stent intervention (pci-s) Right 04/13/2011    Procedure: PERCUTANEOUS CORONARY STENT INTERVENTION (PCI-S);  Surgeon: Burnell Blanks, MD;  Location: [facility] CATH LAB;  Service: Cardiovascular;  Laterality: Right;  . Left heart catheterization with coronary angiogram N/A 06/20/2013    Procedure: LEFT HEART CATHETERIZATION WITH CORONARY ANGIOGRAM;  Surgeon: Leonie Man, MD;  Location: [facility] CATH LAB;  Service: Cardiovascular;  Laterality: N/A;    Family History  Problem Relation Age of Onset  . Heart attack Paternal Grandfather   . Cancer Father     SOCIAL HISTORY: Social History   Social History  . Marital Status: Unknown    Spouse Name: N/A  . Number of Children: N/A  . Years of Education: N/A   Occupational History  . Retired    Social History Main Topics  . Smoking status: Former Smoker -- 1.00 packs/day for 12 years    Types: Cigarettes  . Smokeless tobacco: Never Used     Comment: "quit smoking cigarettes 1969"  . Alcohol Use: 0.0 oz/week    0 Standard drinks or equivalent per week     Comment: "occasionally I'll have a beer; last time Summer 2012; prior to that was ~2008""  . Drug Use:  No  . Sexual Activity: Not on file   Other Topics Concern  . Not on file   Social History Narrative   Married   [place]   Retired from [employer] smoking in 96   Sedentary    No Known Allergies Current Outpatient Prescriptions on File Prior to Visit  Medication Sig Dispense Refill  . atorvastatin (LIPITOR) 80 MG tablet Take 1 tablet (80 mg total) by mouth every evening. 30 tablet 11  . chlorthalidone (HYGROTON) 25 MG tablet Take 12.5 mg by mouth daily.    . dabigatran (PRADAXA) 150 MG CAPS capsule Take 150 mg by mouth 2 (two) times daily.    . diphenoxylate-atropine (LOMOTIL) 2.5-0.025 MG tablet Take 1 tablet by  mouth daily as needed for diarrhea or loose stools.    . enalapril (VASOTEC) 20 MG tablet Take 1 tablet (20 mg total) by mouth 2 (two) times daily. 60 tablet 11  . isosorbide mononitrate (IMDUR) 30 MG 24 hr tablet Take 30 mg by mouth 2 (two) times daily.    . metoprolol (TOPROL-XL) 200 MG 24 hr tablet Take 100 mg by mouth daily.    . niacin (NIASPAN) 750 MG CR tablet Take 750 mg by mouth at bedtime.      Marland Kitchen NIFEdipine (ADALAT CC) 90 MG 24 hr tablet Take 90 mg by mouth every evening.    . nitroGLYCERIN (NITROSTAT) 0.4 MG SL tablet Place 1 tablet (0.4 mg total) under the tongue every 5 (five) minutes as needed for chest pain. 25 tablet 12  . Omega-3 Fatty Acids (FISH OIL) 500 MG CAPS Take 1,000 mg by mouth every evening.    Marland Kitchen OVER THE COUNTER MEDICATION Take 1 capsule by mouth daily. "Super Beta Prostate" supplement     No current facility-administered medications on file prior to visit.    ROS:   General:  No weight loss, Fever, chills  HEENT: No recent headaches, no nasal bleeding, no visual changes, no sore throat  Neurologic: No dizziness, blackouts, seizures. No recent symptoms of stroke or mini- stroke. No recent episodes of slurred speech, or temporary blindness.  Cardiac: No recent episodes of chest pain/pressure, no shortness of breath at rest.  No shortness of breath with exertion.  Denies history of atrial fibrillation or irregular heartbeat  Vascular: No history of rest pain in feet.  No history of claudication.  No history of non-healing ulcer, No history of DVT   Pulmonary: No home oxygen, no productive cough, no hemoptysis,  No asthma or wheezing  Musculoskeletal:  [ ]  Arthritis, [ ]  Low back pain,  [ ]  Joint pain  Hematologic:No history of hypercoagulable state.  No history of easy bleeding.  No history of anemia  Gastrointestinal: No hematochezia or melena,  No gastroesophageal reflux, no trouble swallowing  Urinary: [ ]  chronic Kidney disease, [ ]  on HD - [ ]  MWF or [  ] TTHS, [ ]  Burning with urination, [ ]  Frequent urination, [ ]  Difficulty urinating;   Skin: No rashes  Psychological: No history of anxiety,  No history of depression   Physical Examination  Filed Vitals:   10/09/15 1026  BP: 132/71  Pulse: 77  Temp: 97.4 F (36.3 C)  Resp: 18  Height: 5\' 8"  (1.727 m)  Weight: 213 lb (96.616 kg)  SpO2: 99%    Body mass index is 32.39 kg/(m^2).  General:  Alert and oriented, no acute distress HEENT: Normal Neck: No bruit or JVD Pulmonary: Clear  to auscultation bilaterally Cardiac: Regular Rate and Rhythm without murmur Abdomen: Soft, non-tender, non-distended, no mass Skin: No rash, multiple scattered varicosities over course of greater saphenous Extremity Pulses:  2+ radial, brachial, 2+ right femoral 1+ left femoral, 2+ right dorsalis pedis, absent left popliteal and pedal pulses Musculoskeletal: No deformity or edema  Neurologic: Upper and lower extremity motor 5/5 and symmetric  DATA:  Patient had bilateral ABIs performed at [facility] on 10/01/2015. ABI on the right was 0.97 left was 0.74  ASSESSMENT:  Left lower extremity claudication lifestyle limiting   PLAN:  Risks benefits possible palpitations and procedure details of arteriogram lower extremity runoff possible intervention were discussed with the patient today. These include but are not limited to bleeding infection vessel injury contrast reaction. He understands and agrees to proceed. We will stop his Pradaxa 3 days prior to the procedure.   Ruta Hinds, MD [facility] [place] Office: [phone] Pager: [phone]

## 2015-10-15 NOTE — Progress Notes (Signed)
Pt B/P remains elevated.174-177/72-75. ACT done =157 R pedal pulse palpable. Sheath intact.

## 2015-10-16 ENCOUNTER — Other Ambulatory Visit: Payer: Self-pay | Admitting: *Deleted

## 2015-10-16 ENCOUNTER — Encounter (HOSPITAL_COMMUNITY): Payer: Self-pay | Admitting: Surgery

## 2015-10-16 DIAGNOSIS — I70212 Atherosclerosis of native arteries of extremities with intermittent claudication, left leg: Secondary | ICD-10-CM | POA: Diagnosis not present

## 2015-10-16 DIAGNOSIS — Z9862 Peripheral vascular angioplasty status: Secondary | ICD-10-CM

## 2015-10-16 DIAGNOSIS — I739 Peripheral vascular disease, unspecified: Secondary | ICD-10-CM

## 2015-10-16 MED ORDER — ENALAPRIL MALEATE 20 MG PO TABS
20.0000 mg | ORAL_TABLET | Freq: Every day | ORAL | Status: AC
Start: 2015-10-16 — End: 2015-10-16
  Administered 2015-10-16: 09:00:00 20 mg via ORAL
  Filled 2015-10-16: qty 1

## 2015-10-16 MED ORDER — METOPROLOL SUCCINATE ER 50 MG PO TB24
200.0000 mg | ORAL_TABLET | Freq: Every day | ORAL | Status: AC
  Administered 2015-10-16: 09:00:00 200 mg via ORAL
  Filled 2015-10-16: qty 4

## 2015-10-16 MED ORDER — ISOSORBIDE MONONITRATE ER 30 MG PO TB24
30.0000 mg | ORAL_TABLET | Freq: Every day | ORAL | Status: AC
  Administered 2015-10-16: 30 mg via ORAL
  Filled 2015-10-16: qty 1

## 2015-10-16 NOTE — Progress Notes (Signed)
Reviewed discharge instructions with pt and spouse including follow up appt, medication and cath site care. Pt and spouse verbalized understanding.

## 2015-10-16 NOTE — Progress Notes (Addendum)
  [facility] Progress Note  Subjective    No complaints.   Objective Filed Vitals:   10/16/15 0525 10/16/15 0713  BP: 162/72 176/77  Pulse: 77 83  Temp: 97.5 F (36.4 C) 97.7 F (36.5 C)  Resp:  16    Intake/Output Summary (Last 24 hours) at 10/16/15 0824 Last data filed at 10/16/15 0700  Gross per 24 hour  Intake 1337.7 ml  Output      0 ml  Net 1337.7 ml    Right groin soft, no hematoma. Left foot is warm.   Assessment/Planning: 75 y.o. male is s/p: aortogram and stenting of left superficial femoral artery. 1 Day Post-Op   Sheath pull delayed yesterday due to hypertension.  Groin without hematoma. May restart pradaxa today. Discharge home.   Raymond Jacobson 10/16/2015 8:24 AM --  Laboratory CBC    Component Value Date/Time   WBC 4.1 10/01/2015 0901   HGB 12.9* 10/15/2015 1147   HCT 38.0* 10/15/2015 1147   PLT 166 10/01/2015 0901    BMET    Component Value Date/Time   NA 137 10/15/2015 1147   K 3.7 10/15/2015 1147   CL 99* 10/15/2015 1147   CO2 29 10/01/2015 0901   GLUCOSE 173* 10/15/2015 1147   BUN 21* 10/15/2015 1147   CREATININE 1.10 10/15/2015 1147   CALCIUM 9.0 10/01/2015 0901   GFRNONAA 54* 10/01/2015 0901   GFRAA >60 10/01/2015 0901    COAG Lab Results  Component Value Date   INR 1.06 10/01/2015   No results found for: PTT  Antibiotics Anti-infectives    None       Raymond Jock, PA-C [facility] Office: [phone] Pager: [phone] 10/16/2015 8:24 AM     Looks great. Will d/c home F/u fields in 1 month  Wells Brabham

## 2015-10-16 NOTE — Progress Notes (Signed)
Dr. Scot Dock made aware that pt had sheath pulled at 2105 and that pressure was held for 25 minutes.  Bedrest to end at 01:30.  Because of earlier blood pressure problems pt has agreed to stay until am. Discharge on hold as per Dr. Scot Dock.will continue to monitor closely.l

## 2015-10-25 ENCOUNTER — Other Ambulatory Visit: Payer: Self-pay | Admitting: *Deleted

## 2015-10-25 DIAGNOSIS — I739 Peripheral vascular disease, unspecified: Secondary | ICD-10-CM

## 2015-11-21 ENCOUNTER — Encounter: Payer: Self-pay | Admitting: Vascular Surgery

## 2015-11-28 ENCOUNTER — Ambulatory Visit (INDEPENDENT_AMBULATORY_CARE_PROVIDER_SITE_OTHER): Payer: No Typology Code available for payment source | Admitting: Vascular Surgery

## 2015-11-28 ENCOUNTER — Ambulatory Visit (HOSPITAL_COMMUNITY)
Admission: RE | Admit: 2015-11-28 | Discharge: 2015-11-28 | Disposition: A | Discharge status: Home / Self Care | Payer: No Typology Code available for payment source | Source: Ambulatory Visit | Attending: Vascular Surgery | Admitting: Vascular Surgery

## 2015-11-28 ENCOUNTER — Encounter: Payer: Self-pay | Admitting: Vascular Surgery

## 2015-11-28 ENCOUNTER — Ambulatory Visit (INDEPENDENT_AMBULATORY_CARE_PROVIDER_SITE_OTHER)
Admission: RE | Admit: 2015-11-28 | Discharge: 2015-11-28 | Disposition: A | Discharge status: Home / Self Care | Payer: No Typology Code available for payment source | Source: Ambulatory Visit | Attending: Vascular Surgery | Admitting: Vascular Surgery

## 2015-11-28 VITALS — BP 111/68 | HR 82 | Temp 97.2°F | Resp 20 | Ht 68.0 in | Wt 201.0 lb

## 2015-11-28 DIAGNOSIS — E7800 Pure hypercholesterolemia, unspecified: Secondary | ICD-10-CM | POA: Insufficient documentation

## 2015-11-28 DIAGNOSIS — T82898A Other specified complication of vascular prosthetic devices, implants and grafts, initial encounter: Secondary | ICD-10-CM | POA: Insufficient documentation

## 2015-11-28 DIAGNOSIS — I10 Essential (primary) hypertension: Secondary | ICD-10-CM | POA: Diagnosis not present

## 2015-11-28 DIAGNOSIS — Y838 Other surgical procedures as the cause of abnormal reaction of the patient, or of later complication, without mention of misadventure at the time of the procedure: Secondary | ICD-10-CM | POA: Diagnosis not present

## 2015-11-28 DIAGNOSIS — I739 Peripheral vascular disease, unspecified: Secondary | ICD-10-CM

## 2015-11-28 DIAGNOSIS — R0989 Other specified symptoms and signs involving the circulatory and respiratory systems: Secondary | ICD-10-CM | POA: Diagnosis present

## 2015-11-28 DIAGNOSIS — Z9862 Peripheral vascular angioplasty status: Secondary | ICD-10-CM

## 2015-11-28 DIAGNOSIS — I2510 Atherosclerotic heart disease of native coronary artery without angina pectoris: Secondary | ICD-10-CM | POA: Insufficient documentation

## 2015-11-28 NOTE — Progress Notes (Signed)
Patient name: Raymond Jacobson      MRN: [id number]       DOB: 05/31/1941         Sex: male  REASON FOR CONSULT: Left leg claudication  HPI: Raymond Jacobson is a 75 y.o. male,  who has a 4-5 month history of pain that radiates from his left foot up to his left hip after walking about a quarter mile. He underwent left superficial femoral artery stenting by Dr. Trula Slade approximately one month ago. His symptoms have not improved. After resting for several minutes this dissipates. He denies rest pain. No history of nonhealing wounds. He denies history of diabetes. He does occasionally get some angina. He does not get angina at rest. Other medical problems include coronary artery disease, hyperlipidemia, hypertension which are currently stable. He is a former smoker but quit almost 40 years ago. He states he is on Pradaxa for his coronary stents. Patient is very frustrated that he can no longer perform the activities that he enjoys such as artery competitions and pickle ball. He states that he believes his quality of life is diminished secondary to this.    Past Medical History   Diagnosis  Date   .  CAD (coronary artery disease)         Previous stents last 6 years ago [place] CT   .  Elevated lipids     .  Hypertension     .  Angina     .  Myocardial infarction [facility])  ~ 2006       "blood work"   .  Peripheral vascular disease ([place])     .  Dry cough     .  Shortness of breath  04/13/11       "mostly w/exertion"   .  Complication of anesthesia     .  PONV (postoperative nausea and vomiting)      Past Surgical History   Procedure  Laterality  Date   .  Tricep surgery    ~ 2007       "right arm; tore all my muscles @ my elbow"   .  Tonsillectomy           "when I was a young one"   .  Finger amputation    1967       "partial; right pointer"   .  Coronary angioplasty with stent placement    ~ 2004; ~ 2006; 04/13/11       "# of stents 1 (i2004);+2 (2006)+ 1 (04/13/11)= 4 total"   .  Left heart  catheterization with coronary angiogram  N/A  04/13/2011       Procedure: LEFT HEART CATHETERIZATION WITH CORONARY ANGIOGRAM;  Surgeon: Josue Hector, MD;  Location: [facility] CATH LAB;  Service: Cardiovascular;  Laterality: N/A;   .  Percutaneous coronary stent intervention (pci-s)  Right  04/13/2011       Procedure: PERCUTANEOUS CORONARY STENT INTERVENTION (PCI-S);  Surgeon: Burnell Blanks, MD;  Location: [facility] CATH LAB;  Service: Cardiovascular;  Laterality: Right;   .  Left heart catheterization with coronary angiogram  N/A  06/20/2013       Procedure: LEFT HEART CATHETERIZATION WITH CORONARY ANGIOGRAM;  Surgeon: Leonie Man, MD;  Location: [facility] CATH LAB;  Service: Cardiovascular;  Laterality: N/A;       Family History   Problem  Relation  Age of Onset   .  Heart attack  Paternal Grandfather     .  Cancer  Father       SOCIAL HISTORY: Social History      Social History   .  Marital Status:  Unknown       Spouse Name:  N/A   .  Number of Children:  N/A   .  Years of Education:  N/A      Occupational History   .  Retired        Social History Main Topics   .  Smoking status:  Former Smoker -- 1.00 packs/day for 12 years       Types:  Cigarettes   .  Smokeless tobacco:  Never Used         Comment: "quit smoking cigarettes 1969"   .  Alcohol Use:  0.0 oz/week       0 Standard drinks or equivalent per week         Comment: "occasionally I'll have a beer; last time Summer 2012; prior to that was ~2008""   .  Drug Use:  No   .  Sexual Activity:  Not on file      Other Topics  Concern   .  Not on file      Social History Narrative     Married     [place]     Retired from [employer] smoking in 96     Sedentary     No Known Allergies Current Outpatient Prescriptions on File Prior to Visit   Medication  Sig  Dispense  Refill   .  atorvastatin (LIPITOR) 80 MG tablet  Take 1 tablet (80 mg total) by mouth every evening.  30 tablet  11   .   chlorthalidone (HYGROTON) 25 MG tablet  Take 12.5 mg by mouth daily.       .  dabigatran (PRADAXA) 150 MG CAPS capsule  Take 150 mg by mouth 2 (two) times daily.       .  diphenoxylate-atropine (LOMOTIL) 2.5-0.025 MG tablet  Take 1 tablet by mouth daily as needed for diarrhea or loose stools.       .  enalapril (VASOTEC) 20 MG tablet  Take 1 tablet (20 mg total) by mouth 2 (two) times daily.  60 tablet  11   .  isosorbide mononitrate (IMDUR) 30 MG 24 hr tablet  Take 30 mg by mouth 2 (two) times daily.       .  metoprolol (TOPROL-XL) 200 MG 24 hr tablet  Take 100 mg by mouth daily.       .  niacin (NIASPAN) 750 MG CR tablet  Take 750 mg by mouth at bedtime.         Marland Kitchen  NIFEdipine (ADALAT CC) 90 MG 24 hr tablet  Take 90 mg by mouth every evening.       .  nitroGLYCERIN (NITROSTAT) 0.4 MG SL tablet  Place 1 tablet (0.4 mg total) under the tongue every 5 (five) minutes as needed for chest pain.  25 tablet  12   .  Omega-3 Fatty Acids (FISH OIL) 500 MG CAPS  Take 1,000 mg by mouth every evening.       Marland Kitchen  OVER THE COUNTER MEDICATION  Take 1 capsule by mouth daily. "Super Beta Prostate" supplement          No current facility-administered medications on file prior to visit.     ROS:    General:  No weight loss, Fever, chills  HEENT: No recent headaches, no nasal bleeding, no visual changes, no sore throat  Neurologic: No dizziness, blackouts, seizures. No recent symptoms of stroke or mini- stroke. No recent episodes of slurred speech, or temporary blindness.  Cardiac: No recent episodes of chest pain/pressure, no shortness of breath at rest.  No shortness of breath with exertion.  Denies history of atrial fibrillation or irregular heartbeat  Vascular: No history of rest pain in feet.  No history of claudication.  No history of non-healing ulcer, No history of DVT    Pulmonary: No home oxygen, no productive cough, no hemoptysis,  No asthma or wheezing  Musculoskeletal:  [ ]  Arthritis, [ ]  Low  back pain,  [ ]  Joint pain  Hematologic:No history of hypercoagulable state.  No history of easy bleeding.  No history of anemia  Gastrointestinal: No hematochezia or melena,  No gastroesophageal reflux, no trouble swallowing  Urinary: [ ]  chronic Kidney disease, [ ]  on HD - [ ]  MWF or [ ]  TTHS, [ ]  Burning with urination, [ ]  Frequent urination, [ ]  Difficulty urinating;    Skin: No rashes  Psychological: No history of anxiety,  No history of depression   Physical Examination    Filed Vitals:   11/28/15 1412  BP: 111/68  Pulse: 82  Temp: 97.2 F (36.2 C)  TempSrc: Oral  Resp: 20  Height: 5\' 8"  (1.727 m)  Weight: 201 lb (91.173 kg)  SpO2: 97%    Body mass index is 32.39 kg/(m^2).  General:  Alert and oriented, no acute distress Skin: No rash, multiple scattered varicosities over course of greater saphenous Extremity Pulses:  2+ radial, brachial, 2+ right femoral 1+ left femoral, 2+ right dorsalis pedis, absent left popliteal and pedal pulses Musculoskeletal: No deformity or edema     Neurologic: Upper and lower extremity motor 5/5 and symmetric  DATA:  Patient had bilateral ABIs performed at [facility] on 10/01/2015. ABI on the right was 0.97 left was 0.74.  His 1 month follow-up ABIs today shows an ABI on the left of 0.67 duplex shows the left stent is occluded.  ASSESSMENT:  Left lower extremity claudication lifestyle limiting   PLAN:  I discussed with patient today possible options of conservative management with a walking program and risk factor modification to see if overall he can improve his walking symptoms without other intervention. His stent was a very limited durability so I believe the patient still feels disabled by his claudication the next step is going to be talking about a left femoral popliteal bypass. The patient will follow-up with me in 6 months time with repeat ABIs. He'll decide at that point if he wishes further intervention or continued  conservative management for now. I did reassure the patient that as long as he is not smoking his lifetime risk of limb loss is less than 5%.  Ruta Hinds, MD [facility] [place] Office: [phone] Pager: [phone]

## 2015-12-10 ENCOUNTER — Telehealth: Payer: Self-pay | Admitting: Vascular Surgery

## 2015-12-10 ENCOUNTER — Other Ambulatory Visit: Payer: Self-pay

## 2015-12-10 NOTE — Telephone Encounter (Signed)
-----   Message from Gregery Na, RN sent at 12/10/2015  2:52 PM EDT ----- Regarding: OV with [facility] Mr. Denke needs an appt. with Dr. Oneida Alar to discuss upcoming (L) fem-pop BP. His surgery is scheduled for 01/01/16.  Thanks, Colletta [place]

## 2015-12-10 NOTE — Telephone Encounter (Signed)
Spoke to pt to sch appt 7/13 at 1:45.

## 2015-12-11 ENCOUNTER — Encounter: Payer: Self-pay | Admitting: Vascular Surgery

## 2015-12-12 ENCOUNTER — Ambulatory Visit (INDEPENDENT_AMBULATORY_CARE_PROVIDER_SITE_OTHER): Payer: No Typology Code available for payment source | Admitting: Vascular Surgery

## 2015-12-12 ENCOUNTER — Encounter: Payer: Self-pay | Admitting: Vascular Surgery

## 2015-12-12 VITALS — BP 117/72 | HR 82 | Temp 97.5°F | Resp 18 | Ht 68.0 in | Wt 196.0 lb

## 2015-12-12 DIAGNOSIS — I739 Peripheral vascular disease, unspecified: Secondary | ICD-10-CM | POA: Diagnosis not present

## 2015-12-12 NOTE — Progress Notes (Signed)
Patient name: Raymond Jacobson      MRN: [id number]      DOB: [birth year]        Sex: male  REASON FOR CONSULT: Left leg claudication  HPI: Lonzie Toran is a 75 y.o. male,  who has a 4-5 month history of pain that radiates from his left foot up to his left hip after walking about a quarter mile. He underwent left superficial femoral artery stenting by Dr. Trula Slade approximately one month ago. His symptoms have not improved. After resting for several minutes this dissipates. He denies rest pain. No history of nonhealing wounds. He denies history of diabetes. He does occasionally get some angina. He does not get angina at rest. Other medical problems include coronary artery disease, hyperlipidemia, hypertension which are currently stable. He is a former smoker but quit almost 40 years ago. He states he is on Pradaxa for his coronary stents. Patient is very frustrated that he can no longer perform the activities that he enjoys such as artery competitions and pickle ball. He states that he believes his quality of life is diminished secondary to this.    Past Medical History    Diagnosis   Date    .   CAD (coronary artery disease)             Previous stents last 6 years ago [facility]    .   Elevated lipids       .   Hypertension       .   Angina       .   Myocardial infarction [facility])   ~ 2006          "blood work"    .   Peripheral vascular disease ([place])       .   Dry cough       .   Shortness of breath   04/13/11          "mostly w/exertion"    .   Complication of anesthesia       .   PONV (postoperative nausea and vomiting)        Past Surgical History    Procedure   Laterality   Date    .   Tricep surgery      ~ 2007          "right arm; tore all my muscles @ my elbow"    .   Tonsillectomy                "when I was a young one"    .   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   Coronary angioplasty with stent placement      ~ 2004; ~ 2006; 04/13/11          "#  of stents 1 (i2004);+2 (2006)+ 1 (04/13/11)= 4 total"    .   Left heart catheterization with coronary angiogram   N/A   04/13/2011          Procedure: LEFT HEART CATHETERIZATION WITH CORONARY ANGIOGRAM;  Surgeon: Josue Hector, MD;  Location: [facility] CATH LAB;  Service: Cardiovascular;  Laterality: N/A;    .   Percutaneous coronary stent intervention (pci-s)   Right   04/13/2011          Procedure: PERCUTANEOUS CORONARY STENT INTERVENTION (PCI-S);  Surgeon: Burnell Blanks, MD;  Location: [facility] CATH LAB;  Service: Cardiovascular;  Laterality: Right;    .   Left heart catheterization with coronary angiogram   N/A   06/20/2013          Procedure: LEFT HEART CATHETERIZATION WITH CORONARY ANGIOGRAM;  Surgeon: Leonie Man, MD;  Location: [facility] CATH LAB;  Service: Cardiovascular;  Laterality: N/A;        Family History    Problem   Relation   Age of Onset    .   Heart attack   Paternal Grandfather       .   Cancer   Father         SOCIAL HISTORY: Social History       Social History    .   Marital Status:   Unknown          Spouse Name:   N/A    .   Number of Children:   N/A    .   Years of Education:   N/A       Occupational History    .   Retired          Social History Main Topics    .   Smoking status:   Former Smoker -- 1.00 packs/day for 12 years          Types:   Cigarettes    .   Smokeless tobacco:   Never Used             Comment: "quit smoking cigarettes 1969"    .   Alcohol Use:   0.0 oz/week          0 Standard drinks or equivalent per week             Comment: "occasionally I'll have a beer; last time Summer 2012; prior to that was ~2008""    .   Drug Use:   No    .   Sexual Activity:   Not on file       Other Topics   Concern    .   Not on file       Social History Narrative       Married       [place]       Retired from [employer] smoking in 96       Sedentary      No Known Allergies Current Outpatient Prescriptions on File Prior to  Visit    Medication   Sig   Dispense   Refill    .   atorvastatin (LIPITOR) 80 MG tablet   Take 1 tablet (80 mg total) by mouth every evening.   30 tablet   11    .   chlorthalidone (HYGROTON) 25 MG tablet   Take 12.5 mg by mouth daily.          .   dabigatran (PRADAXA) 150 MG CAPS capsule   Take 150 mg by mouth 2 (two) times daily.          .   diphenoxylate-atropine (LOMOTIL) 2.5-0.025 MG tablet   Take 1 tablet by mouth daily as needed for diarrhea or loose stools.          .   enalapril (VASOTEC) 20 MG tablet   Take 1 tablet (20 mg total) by mouth 2 (two) times daily.   60 tablet   11    .   isosorbide  mononitrate (IMDUR) 30 MG 24 hr tablet   Take 30 mg by mouth 2 (two) times daily.          .   metoprolol (TOPROL-XL) 200 MG 24 hr tablet   Take 100 mg by mouth daily.          .   niacin (NIASPAN) 750 MG CR tablet   Take 750 mg by mouth at bedtime.            Marland Kitchen   NIFEdipine (ADALAT CC) 90 MG 24 hr tablet   Take 90 mg by mouth every evening.          .   nitroGLYCERIN (NITROSTAT) 0.4 MG SL tablet   Place 1 tablet (0.4 mg total) under the tongue every 5 (five) minutes as needed for chest pain.   25 tablet   12    .   Omega-3 Fatty Acids (FISH OIL) 500 MG CAPS   Take 1,000 mg by mouth every evening.          Marland Kitchen   OVER THE COUNTER MEDICATION   Take 1 capsule by mouth daily. "Super Beta Prostate" supplement             No current facility-administered medications on file prior to visit.      ROS:    General:  No weight loss, Fever, chills  HEENT: No recent headaches, no nasal bleeding, no visual changes, no sore throat  Neurologic: No dizziness, blackouts, seizures. No recent symptoms of stroke or mini- stroke. No recent episodes of slurred speech, or temporary blindness.  Cardiac: No recent episodes of chest pain/pressure, no shortness of breath at rest.  No shortness of breath with exertion.  Denies history of atrial fibrillation or irregular heartbeat  Vascular: No history of rest pain in  feet.  No history of claudication.  No history of non-healing ulcer, No history of DVT    Pulmonary: No home oxygen, no productive cough, no hemoptysis,  No asthma or wheezing  Musculoskeletal:  [ ]  Arthritis, [ ]  Low back pain,  [ ]  Joint pain  Hematologic:No history of hypercoagulable state.  No history of easy bleeding.  No history of anemia  Gastrointestinal: No hematochezia or melena,  No gastroesophageal reflux, no trouble swallowing  Urinary: [ ]  chronic Kidney disease, [ ]  on HD - [ ]  MWF or [ ]  TTHS, [ ]  Burning with urination, [ ]  Frequent urination, [ ]  Difficulty urinating;    Skin: No rashes  Psychological: No history of anxiety,  No history of depression   Physical Examination    Filed Vitals:   12/12/15 1353  BP: 117/72  Pulse: 82  Temp: 97.5 F (36.4 C)  TempSrc: Oral  Resp: 18  Height: 5\' 8"  (1.727 m)  Weight: 196 lb (88.905 kg)  SpO2: 82%    General:  Alert and oriented, no acute distress Skin: No rash, multiple scattered varicosities over course of greater saphenous Extremity Pulses:  2+ radial, brachial, 2+ right femoral 1+ left femoral, 2+ right dorsalis pedis, absent left popliteal and pedal pulses Musculoskeletal: No deformity or edema     Neurologic: Upper and lower extremity motor 5/5 and symmetric  DATA:  Patient had bilateral ABIs performed at [facility] on 10/01/2015. ABI on the right was 0.97 left was 0.74.  His 1 month follow-up ABIs today shows an ABI on the left of 0.67 duplex shows the left stent is occluded.  I reviewed the patient's recent arteriogram  today. This shows a short segment left SFA occlusion which was then stented. The patient has essentially one-vessel runoff via the peroneal. The anterior tibial artery is open proximally but occludes in the distal leg. The peroneal artery fills the dorsalis pedis artery.  ASSESSMENT:  Left lower extremity claudication lifestyle limiting   PLAN:  I discussed with patient today  possible options of conservative management with a walking program and risk factor modification to see if overall he can improve his walking symptoms without other intervention. At this point he has opted for femoral popliteal bypass grafting in order to improve his symptoms. Risks benefits possible complications and procedure details were discussed with the patient today. These include but are not limited to myocardial events graft thrombosis graft her ability was also discussed. Infection and bleeding also discussed.  Patient's operation is scheduled for 01/01/2016 we will stop his anticoagulation perioperatively.  Ruta Hinds, MD [facility] Office: [phone] Pager: [phone]

## 2015-12-23 ENCOUNTER — Encounter (HOSPITAL_COMMUNITY): Payer: Self-pay

## 2015-12-23 NOTE — Pre-Procedure Instructions (Addendum)
Raymond Jacobson  12/23/2015      [place], [place] - [address] [facility]. [address] [place] [address] Phone: [phone] Fax: [phone]  [id number] - [place], [place] [address] Phone: [phone] Fax: [phone]    Your procedure is scheduled on Wednesday August 2  Report to [facility] at [place].  Call this number if you have problems the morning of surgery:  [phone]   Remember:  Do not eat food or drink liquids after midnight.   Take these medicines the morning of surgery with A SIP OF WATER isosorbide Mononitrate (IMDUR), metoprolol (toprol), nitro If needed  7 days prior to surgery STOP taking any , Aleve, Naproxen, Ibuprofen, Motrin, Advil, Goody's, BC's, all herbal medications, fish oil, and all vitamins(12/25/15)  Stop pradexa per dr(12/28/15)    Do not wear jewelry, make-up or nail polish.  Do not wear lotions, powders, or perfumes.  You may NOT wear deoderant.  Men may shave face and neck.  Do not bring valuables to the hospital.  [facility] is not responsible for any belongings or valuables.  Contacts, dentures or bridgework may not be worn into surgery.  Leave your suitcase in the car.  After surgery it may be brought to your room.  For patients admitted to the hospital, discharge time will be determined by your treatment team.  Patients discharged the day of surgery will not be allowed to drive home.    Special instructions:   - Preparing For Surgery  Before surgery, you can play an important role. Because skin is not sterile, your skin needs to be as free of germs as possible. You can reduce the number of germs on your skin by washing with CHG (chlorahexidine gluconate) Soap before surgery.  CHG is an antiseptic cleaner which kills germs and bonds with the skin to continue killing germs even after  washing.  Please do not use if you have an allergy to CHG or antibacterial soaps. If your skin becomes reddened/irritated stop using the CHG.  Do not shave (including legs and underarms) for at least 48 hours prior to first CHG shower. It is OK to shave your face.  Please follow these instructions carefully.   1. Shower the NIGHT BEFORE SURGERY and the MORNING OF SURGERY with CHG.   2. If you chose to wash your hair, wash your hair first as usual with your normal shampoo.  3. After you shampoo, rinse your hair and body thoroughly to remove the shampoo.  4. Use CHG as you would any other liquid soap. You can apply CHG directly to the skin and wash gently with a scrungie or a clean washcloth.   5. Apply the CHG Soap to your body ONLY FROM THE NECK DOWN.  Do not use on open wounds or open sores. Avoid contact with your eyes, ears, mouth and genitals (private parts). Wash genitals (private parts) with your normal soap.  6. Wash thoroughly, paying special attention to the area where your surgery will be performed.  7. Thoroughly rinse your body with warm water from the neck down.  8. DO NOT shower/wash with your normal soap after using and rinsing off the CHG Soap.  9. Pat yourself dry with a CLEAN TOWEL.   10. Wear CLEAN PAJAMAS   11. Place CLEAN SHEETS on your bed the night of your first shower and DO  NOT SLEEP WITH PETS.    Day of Surgery: Do not apply any deodorants/lotions. Please wear clean clothes to the hospital/surgery center.      Please read over the following fact sheets that you were given. Pain Booklet, Coughing and Deep Breathing and Surgical Site Infection Prevention,mrsa info

## 2015-12-24 ENCOUNTER — Encounter (HOSPITAL_COMMUNITY)
Admission: RE | Admit: 2015-12-24 | Discharge: 2015-12-24 | Disposition: A | Discharge status: Home / Self Care | Payer: No Typology Code available for payment source | Source: Ambulatory Visit | Attending: Vascular Surgery | Admitting: Vascular Surgery

## 2015-12-24 ENCOUNTER — Encounter (HOSPITAL_COMMUNITY): Payer: Self-pay

## 2015-12-24 DIAGNOSIS — I10 Essential (primary) hypertension: Secondary | ICD-10-CM | POA: Insufficient documentation

## 2015-12-24 DIAGNOSIS — Z85828 Personal history of other malignant neoplasm of skin: Secondary | ICD-10-CM | POA: Insufficient documentation

## 2015-12-24 DIAGNOSIS — K219 Gastro-esophageal reflux disease without esophagitis: Secondary | ICD-10-CM | POA: Diagnosis not present

## 2015-12-24 DIAGNOSIS — Z01818 Encounter for other preprocedural examination: Secondary | ICD-10-CM | POA: Insufficient documentation

## 2015-12-24 DIAGNOSIS — I2510 Atherosclerotic heart disease of native coronary artery without angina pectoris: Secondary | ICD-10-CM | POA: Diagnosis not present

## 2015-12-24 DIAGNOSIS — Z87891 Personal history of nicotine dependence: Secondary | ICD-10-CM | POA: Insufficient documentation

## 2015-12-24 DIAGNOSIS — Z01812 Encounter for preprocedural laboratory examination: Secondary | ICD-10-CM | POA: Insufficient documentation

## 2015-12-24 DIAGNOSIS — Z79899 Other long term (current) drug therapy: Secondary | ICD-10-CM | POA: Diagnosis not present

## 2015-12-24 DIAGNOSIS — Z7901 Long term (current) use of anticoagulants: Secondary | ICD-10-CM | POA: Diagnosis not present

## 2015-12-24 DIAGNOSIS — E785 Hyperlipidemia, unspecified: Secondary | ICD-10-CM | POA: Diagnosis not present

## 2015-12-24 DIAGNOSIS — I252 Old myocardial infarction: Secondary | ICD-10-CM | POA: Diagnosis not present

## 2015-12-24 DIAGNOSIS — Z0183 Encounter for blood typing: Secondary | ICD-10-CM | POA: Diagnosis not present

## 2015-12-24 DIAGNOSIS — Z86718 Personal history of other venous thrombosis and embolism: Secondary | ICD-10-CM | POA: Diagnosis not present

## 2015-12-24 DIAGNOSIS — I739 Peripheral vascular disease, unspecified: Secondary | ICD-10-CM | POA: Insufficient documentation

## 2015-12-24 DIAGNOSIS — Z955 Presence of coronary angioplasty implant and graft: Secondary | ICD-10-CM | POA: Diagnosis not present

## 2015-12-24 HISTORY — DX: Personal history of other diseases of the digestive system: Z87.19

## 2015-12-24 HISTORY — DX: Gastro-esophageal reflux disease without esophagitis: K21.9

## 2015-12-24 HISTORY — DX: Cough: R05

## 2015-12-24 HISTORY — DX: Cough, unspecified: R05.9

## 2015-12-24 LAB — TYPE AND SCREEN
ABO/RH(D): A POS
Antibody Screen: NEGATIVE

## 2015-12-24 LAB — SURGICAL PCR SCREEN
MRSA, PCR: NEGATIVE
STAPHYLOCOCCUS AUREUS: NEGATIVE

## 2015-12-24 LAB — COMPREHENSIVE METABOLIC PANEL
ALK PHOS: 96 U/L (ref 38–126)
ALT: 28 U/L (ref 17–63)
AST: 22 U/L (ref 15–41)
Albumin: 4.2 g/dL (ref 3.5–5.0)
Anion gap: 9 (ref 5–15)
BILIRUBIN TOTAL: 1.1 mg/dL (ref 0.3–1.2)
BUN: 24 mg/dL — ABNORMAL HIGH (ref 6–20)
CALCIUM: 9.2 mg/dL (ref 8.9–10.3)
CO2: 24 mmol/L (ref 22–32)
CREATININE: 1.36 mg/dL — AB (ref 0.61–1.24)
Chloride: 103 mmol/L (ref 101–111)
GFR calc non Af Amer: 49 mL/min — ABNORMAL LOW (ref >60)
GFR, EST AFRICAN AMERICAN: 57 mL/min — AB (ref >60)
Glucose, Bld: 149 mg/dL — ABNORMAL HIGH (ref 65–99)
Potassium: 3.4 mmol/L — ABNORMAL LOW (ref 3.5–5.1)
SODIUM: 136 mmol/L (ref 135–145)
TOTAL PROTEIN: 6.7 g/dL (ref 6.5–8.1)

## 2015-12-24 LAB — URINALYSIS, ROUTINE W REFLEX MICROSCOPIC
Bilirubin Urine: NEGATIVE
GLUCOSE, UA: NEGATIVE mg/dL
Hgb urine dipstick: NEGATIVE
Ketones, ur: NEGATIVE mg/dL
LEUKOCYTES UA: NEGATIVE
Nitrite: NEGATIVE
PROTEIN: NEGATIVE mg/dL
SPECIFIC GRAVITY, URINE: 1.031 — AB (ref 1.005–1.030)
pH: 5 (ref 5.0–8.0)

## 2015-12-24 LAB — CBC
HCT: 41.4 % (ref 39.0–52.0)
Hemoglobin: 14.2 g/dL (ref 13.0–17.0)
MCH: 29.6 pg (ref 26.0–34.0)
MCHC: 34.3 g/dL (ref 30.0–36.0)
MCV: 86.3 fL (ref 78.0–100.0)
Platelets: 215 10*3/uL (ref 150–400)
RBC: 4.8 MIL/uL (ref 4.22–5.81)
RDW: 14.1 % (ref 11.5–15.5)
WBC: 5.9 10*3/uL (ref 4.0–10.5)

## 2015-12-24 LAB — PROTIME-INR
INR: 1.72 — AB (ref 0.00–1.49)
Prothrombin Time: 20.1 seconds — ABNORMAL HIGH (ref 11.6–15.2)

## 2015-12-24 LAB — ABO/RH: ABO/RH(D): A POS

## 2015-12-24 LAB — APTT: APTT: 58 s — AB (ref 24–37)

## 2015-12-24 NOTE — Progress Notes (Signed)
Coag results called to [facility] and also to tcts office. Message left for stephanie

## 2015-12-25 ENCOUNTER — Other Ambulatory Visit: Payer: Self-pay

## 2015-12-26 NOTE — Progress Notes (Addendum)
Anesthesia Chart Review: Patient is a 75 year old male scheduled for left FPBG on 01/01/16 by Dr.Fields.   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 He receives most of his medical care through the [facility]. We think he has seen cardiologist Dr. Broadus Aundrey Hodgkiss there. Locally he has seen multiple difference cardiologists with [facility] during 2012 and 2015 hospitalizations.   Meds include Lipitor, chlorthalidone, Pradaxa, enalapril, Imdur, Toprol-XL, Niaspan, nifedipine, nitroglycerin. He is to hold Pradaxa starting 12/28/15.  10/05/15 EKG: SB at 59 bpm, inferior infarct (age undetermined). No significant change since last tracing 06/21/13.   06/21/13 Echo: Study Conclusions - Left ventricle: The cavity size was normal. Wall thickness was increased in a pattern of mild LVH. Systolic function was normal. The estimated ejection fraction was in the range of 60% to 65%. Wall motion was normal; there were no regional wall motion abnormalities. Doppler parameters are consistent with abnormal left ventricular relaxation (grade 1 diastolic dysfunction). - Left atrium: The atrium was moderately dilated. - Right atrium: The atrium was mildly dilated. - Tricuspid valve: Moderate regurgitation. - Pulmonary arteries: PA peak pressure: 97mm Hg (S).  06/20/13 Cardiac cath [facility]): - Left ventriculography: Unable to recross aortic valve post PCI due to mild brachial artery spasm. - Coronary anatomy: LM: Large caliber vessel, trifurcates into the LAD, circumflex, small caliber ramus intermedius. LAD: Large caliber vessel that has a proximal septal perforator and diagonal 1 followed by a short segment that has diffuse 20% irregular stenoses in the  intervening segment between D1 and D2 also has a septal perforator. Beyond D2 there is widely patent stents. D1: Moderate large caliber vessel that perfuses part of anterior lateral wall. Mildly tortuous but free of any significant disease. D2: Small caliber vessel with mild luminal irregularities. LCX: Large caliber, likely dominant vessel that gives off a very small marginal branch proximally and has a widely patent stent. As it courses into the AV groove and gives off a large lateral OM1 that is widely patent. It then courses into the AV groove where there is a stent that has a roughly 60-80% in-stent restenosis in the distal two thirds of the recently placed Promus element DES stent. Beyond the stent vessel branches and 4 separate branches (see below). OM1: Large caliber vessel that courses along the inferior lateral wall to the apex. Mild luminal irregularities proximally but otherwise normal. LPL 1 and 2: Moderate caliber vessels, mild luminal irregularities. L PDA: Larger and more distal of the 2 PDA branches. It is moderate caliber with mild luminal irregularities. RAMUS INT: Small moderate caliber vessel with an ostial 90% stenosis. The vessel itself was probably less than 2 mm in size and not amendable to PCI. RCA: Small caliber nondominant vessel that is subtotally occluded just for bifurcates into the RV marginal branch and the follow on distal branch (unchanged from previous). - PCI: Cutting Balloon angioplasty distal mid circumflex for in-stent restenosis. 70-80% reduced to 0%.  We requested cardiology and primary care records from the [facility]. EKG from 2015 and echo from 2013 received, but no office notes. No record of stress test there.  Preoperative labs note. K 3.4, Cr 1.36, glucose 149. CBC, UA WNL. PT/PTT elevated (called to [facility] with plans to recheck DOS). T&S done.   Patient with known CAD and multiple PCI (last  2015) with unclear cardiology follow-up. By notes he reports  occasional angina (although he denied any over the past year at PAT). He is now for peripheral vascular surgery. Recommend preoperative cardiology clearance. Stephanie at [facility] to contact the [facility] or either [facility]. Will leave chart for follow-up clearance. (Update: Last cardiology note from Dr. Gretta Arab was from 08/09/13. Patient currently has a cardiac clearance visit scheduled with Kerin Ransom, PA-C on 12/31/15 at 2:30 PM.)  George Hugh [facility] Short Stay Center/Anesthesiology Phone [phone] 12/26/2015 2:45 PM  Addendum:   Pt saw Kerin Ransom, PA with cardiology 12/31/15 and was cleared for surgery at intermediate risk.   If no changes, I anticipate pt can proceed with surgery as scheduled.   Willeen Cass, FNP-BC [facility] Short Stay Surgical Center/Anesthesiology Phone: [phone] 12/31/2015 4:28 PM

## 2015-12-31 ENCOUNTER — Encounter: Payer: Self-pay | Admitting: Cardiology

## 2015-12-31 ENCOUNTER — Encounter: Payer: Self-pay | Admitting: *Deleted

## 2015-12-31 ENCOUNTER — Ambulatory Visit (INDEPENDENT_AMBULATORY_CARE_PROVIDER_SITE_OTHER): Payer: No Typology Code available for payment source | Admitting: Cardiology

## 2015-12-31 VITALS — BP 112/66 | HR 84 | Ht 68.0 in | Wt 194.0 lb

## 2015-12-31 DIAGNOSIS — Z01810 Encounter for preprocedural cardiovascular examination: Secondary | ICD-10-CM | POA: Diagnosis not present

## 2015-12-31 DIAGNOSIS — Z9861 Coronary angioplasty status: Secondary | ICD-10-CM

## 2015-12-31 DIAGNOSIS — Z01818 Encounter for other preprocedural examination: Secondary | ICD-10-CM | POA: Diagnosis not present

## 2015-12-31 DIAGNOSIS — I10 Essential (primary) hypertension: Secondary | ICD-10-CM | POA: Diagnosis not present

## 2015-12-31 DIAGNOSIS — I2510 Atherosclerotic heart disease of native coronary artery without angina pectoris: Secondary | ICD-10-CM

## 2015-12-31 DIAGNOSIS — E785 Hyperlipidemia, unspecified: Secondary | ICD-10-CM

## 2015-12-31 DIAGNOSIS — I739 Peripheral vascular disease, unspecified: Secondary | ICD-10-CM

## 2015-12-31 DIAGNOSIS — I2699 Other pulmonary embolism without acute cor pulmonale: Secondary | ICD-10-CM

## 2015-12-31 HISTORY — DX: Other pulmonary embolism without acute cor pulmonale: I26.99

## 2015-12-31 NOTE — Assessment & Plan Note (Signed)
Pt is in the office today for pre op clearance prior to fem-pop bypass scheduled for tomorrow

## 2015-12-31 NOTE — Assessment & Plan Note (Signed)
LSFA PTA May 2017, continued symptoms, pt to have FPBPG 8/2

## 2015-12-31 NOTE — Patient Instructions (Addendum)
Your physician recommends that you schedule a follow-up appointment in: 4 weeks  With MD here or at [facility].   Your physician recommends that you continue on your current medications as directed. Please refer to the Current Medication list given to you today.  You have been cleared for surgery from a cardiac standpoint.   Thank you for choosing [facility]!

## 2015-12-31 NOTE — Progress Notes (Signed)
12/31/2015 Raymond Jacobson   [birth year]  [id number]  Primary Physician [facility]  HPI:  75 y/o Caucasian male with a history of CAD. He had a PCI in [place] in 2006. In 2012 he had an LAD DES and two CFX DES place. He was next seen by [place] in Jan 2015. He had a cath then and subsequent cutting balloon intervention to his mid CFX stent, other stens were open. He followed up with his cardiologist in March 2015 (changed from [place] to Pradaxa then). He has had no further cardiology follow up. He has not had a stress test since his last PCI, but he tells me he hasn't had chest pain or used NTG and he is active though his activity is now limited by claudication. He did have an echo Jan 2015 that showed preserved LVF. He had a LSFA PTA in May 2017 but continued to have claudication and dopplers showed this site to be occluded. He is to have LFPBPG in the am and is seen today for pre op clearance.    Current Outpatient Prescriptions  Medication Sig Dispense Refill  . atorvastatin (LIPITOR) 80 MG tablet Take 1 tablet (80 mg total) by mouth every evening. (Patient taking differently: Take 40 mg by mouth every evening. ) 30 tablet 11  . chlorthalidone (HYGROTON) 25 MG tablet Take 12.5 mg by mouth daily.    . dabigatran (PRADAXA) 150 MG CAPS capsule Take 150 mg by mouth 2 (two) times daily.    . diphenoxylate-atropine (LOMOTIL) 2.5-0.025 MG tablet Take 1 tablet by mouth daily as needed for diarrhea or loose stools.    . enalapril (VASOTEC) 20 MG tablet Take 1 tablet (20 mg total) by mouth 2 (two) times daily. 60 tablet 11  . isosorbide mononitrate (IMDUR) 30 MG 24 hr tablet Take 30 mg by mouth 2 (two) times daily.    . metoprolol (TOPROL-XL) 200 MG 24 hr tablet Take 100 mg by mouth daily.    . niacin (NIASPAN) 750 MG CR tablet Take 750 mg by mouth at bedtime.      Marland Kitchen NIFEdipine (ADALAT CC) 90 MG 24 hr tablet Take 90 mg by mouth every evening.    . nitroGLYCERIN (NITROSTAT)  0.4 MG SL tablet Place 1 tablet (0.4 mg total) under the tongue every 5 (five) minutes as needed for chest pain. 25 tablet 12  . OVER THE COUNTER MEDICATION Take 1 capsule by mouth daily. "Super Beta Prostate" supplement     No current facility-administered medications for this visit.     No Known Allergies  Social History   Social History  . Marital status: Divorced    Spouse name: N/A  . Number of children: N/A  . Years of education: N/A   Occupational History  . Retired    Social History Main Topics  . Smoking status: Former Smoker    Packs/day: 1.00    Years: 12.00    Types: Cigarettes    Quit date: 12/24/1967  . Smokeless tobacco: Never Used     Comment: "quit smoking cigarettes 1969"  . Alcohol use 0.0 oz/week     Comment: 10/15/2015 last beer was summer 2012; prior to that ~2008""  . Drug use: No  . Sexual activity: No   Other Topics Concern  . Not on file   Social History Narrative   Married   [place]   Retired from [employer] smoking in 96   Sedentary  Review of Systems: General: negative for chills, fever, night sweats or weight changes.  Cardiovascular: negative for chest pain, dyspnea on exertion, edema, orthopnea, palpitations, paroxysmal nocturnal dyspnea or shortness of breath Dermatological: negative for rash Respiratory: negative for cough or wheezing Urologic: negative for hematuria Abdominal: negative for nausea, vomiting, diarrhea, bright red blood per rectum, melena, or hematemesis Neurologic: negative for visual changes, syncope, or dizziness All other systems reviewed and are otherwise negative except as noted above.    Blood pressure 112/66, pulse 84, height 5\' 8"  (1.727 m), weight 194 lb (88 kg), SpO2 98 %.  General appearance: alert, cooperative and no distress Neck: no carotid bruit and no JVD Lungs: decreased breath sounds but no wheezing or rales Heart: regular rate and rhythm Abdomen: soft, non-tender;  bowel sounds normal; no masses,  no organomegaly Extremities: no edema Skin: Skin color, texture, turgor normal. No rashes or lesions Neurologic: Grossly normal  EKG NSR, old inferior Qs  ASSESSMENT AND PLAN:   Pre-operative cardiovascular examination Pt is in the office today for pre op clearance prior to fem-pop bypass scheduled for tomorrow  HTN (hypertension) Controlled  Elevated lipids On statin and niacin, no recent lipids  PAD (peripheral artery disease) ([place]) LSFA PTA May 2017, continued symptoms, 75 to have FPBPG 8/2    PLAN  Mr Boutell would appear to be an intermediate but acceptable risk for proposed surgery from a cardiology standpoint. He has CAD but has not had recent angina by my interview. He needs closer cardiology follow up after discharge either with [place] or with the [place]. We will be available for any cardiac issues peri op. Discussed with Dr Bronson Ing today in the office.   Raymond Ransom PA-C 12/31/2015 3:00 PM

## 2015-12-31 NOTE — Assessment & Plan Note (Signed)
Controlled.  

## 2015-12-31 NOTE — Assessment & Plan Note (Addendum)
On statin and niacin, no recent lipids

## 2016-01-01 ENCOUNTER — Inpatient Hospital Stay (HOSPITAL_COMMUNITY): Payer: No Typology Code available for payment source | Admitting: Anesthesiology

## 2016-01-01 ENCOUNTER — Inpatient Hospital Stay (HOSPITAL_COMMUNITY): Payer: No Typology Code available for payment source | Admitting: Vascular Surgery

## 2016-01-01 ENCOUNTER — Encounter (HOSPITAL_COMMUNITY): Payer: Self-pay | Admitting: Certified Registered Nurse Anesthetist

## 2016-01-01 ENCOUNTER — Encounter (HOSPITAL_COMMUNITY)
Admission: RE | Disposition: A | Discharge status: Rehabilitation Facility | Payer: Self-pay | Source: Ambulatory Visit | Attending: Vascular Surgery

## 2016-01-01 ENCOUNTER — Inpatient Hospital Stay (HOSPITAL_COMMUNITY)
Admission: RE | Admit: 2016-01-01 | Discharge: 2016-01-17 | DRG: 239 | Disposition: A | Discharge status: Rehabilitation Facility | Payer: No Typology Code available for payment source | Source: Ambulatory Visit | Attending: Vascular Surgery | Admitting: Vascular Surgery

## 2016-01-01 ENCOUNTER — Inpatient Hospital Stay (HOSPITAL_COMMUNITY): Payer: No Typology Code available for payment source

## 2016-01-01 DIAGNOSIS — D62 Acute posthemorrhagic anemia: Secondary | ICD-10-CM | POA: Diagnosis not present

## 2016-01-01 DIAGNOSIS — Z79899 Other long term (current) drug therapy: Secondary | ICD-10-CM | POA: Diagnosis not present

## 2016-01-01 DIAGNOSIS — R069 Unspecified abnormalities of breathing: Secondary | ICD-10-CM

## 2016-01-01 DIAGNOSIS — Z9689 Presence of other specified functional implants: Secondary | ICD-10-CM

## 2016-01-01 DIAGNOSIS — R140 Abdominal distension (gaseous): Secondary | ICD-10-CM | POA: Diagnosis not present

## 2016-01-01 DIAGNOSIS — Z89021 Acquired absence of right finger(s): Secondary | ICD-10-CM | POA: Diagnosis not present

## 2016-01-01 DIAGNOSIS — J90 Pleural effusion, not elsewhere classified: Secondary | ICD-10-CM | POA: Diagnosis not present

## 2016-01-01 DIAGNOSIS — E785 Hyperlipidemia, unspecified: Secondary | ICD-10-CM

## 2016-01-01 DIAGNOSIS — I739 Peripheral vascular disease, unspecified: Secondary | ICD-10-CM

## 2016-01-01 DIAGNOSIS — I25119 Atherosclerotic heart disease of native coronary artery with unspecified angina pectoris: Secondary | ICD-10-CM | POA: Diagnosis present

## 2016-01-01 DIAGNOSIS — I2609 Other pulmonary embolism with acute cor pulmonale: Secondary | ICD-10-CM | POA: Diagnosis not present

## 2016-01-01 DIAGNOSIS — R000 Tachycardia, unspecified: Secondary | ICD-10-CM

## 2016-01-01 DIAGNOSIS — M79605 Pain in left leg: Secondary | ICD-10-CM | POA: Diagnosis not present

## 2016-01-01 DIAGNOSIS — R0682 Tachypnea, not elsewhere classified: Secondary | ICD-10-CM

## 2016-01-01 DIAGNOSIS — I2699 Other pulmonary embolism without acute cor pulmonale: Secondary | ICD-10-CM | POA: Diagnosis not present

## 2016-01-01 DIAGNOSIS — Z89612 Acquired absence of left leg above knee: Secondary | ICD-10-CM | POA: Diagnosis not present

## 2016-01-01 DIAGNOSIS — Z419 Encounter for procedure for purposes other than remedying health state, unspecified: Secondary | ICD-10-CM

## 2016-01-01 DIAGNOSIS — K219 Gastro-esophageal reflux disease without esophagitis: Secondary | ICD-10-CM | POA: Diagnosis present

## 2016-01-01 DIAGNOSIS — D696 Thrombocytopenia, unspecified: Secondary | ICD-10-CM

## 2016-01-01 DIAGNOSIS — Z955 Presence of coronary angioplasty implant and graft: Secondary | ICD-10-CM

## 2016-01-01 DIAGNOSIS — J9600 Acute respiratory failure, unspecified whether with hypoxia or hypercapnia: Secondary | ICD-10-CM

## 2016-01-01 DIAGNOSIS — Y832 Surgical operation with anastomosis, bypass or graft as the cause of abnormal reaction of the patient, or of later complication, without mention of misadventure at the time of the procedure: Secondary | ICD-10-CM | POA: Diagnosis not present

## 2016-01-01 DIAGNOSIS — N170 Acute kidney failure with tubular necrosis: Secondary | ICD-10-CM | POA: Diagnosis not present

## 2016-01-01 DIAGNOSIS — R109 Unspecified abdominal pain: Secondary | ICD-10-CM

## 2016-01-01 DIAGNOSIS — J930 Spontaneous tension pneumothorax: Secondary | ICD-10-CM | POA: Diagnosis not present

## 2016-01-01 DIAGNOSIS — I4891 Unspecified atrial fibrillation: Secondary | ICD-10-CM | POA: Diagnosis present

## 2016-01-01 DIAGNOSIS — J939 Pneumothorax, unspecified: Secondary | ICD-10-CM

## 2016-01-01 DIAGNOSIS — I252 Old myocardial infarction: Secondary | ICD-10-CM

## 2016-01-01 DIAGNOSIS — Z87891 Personal history of nicotine dependence: Secondary | ICD-10-CM | POA: Diagnosis not present

## 2016-01-01 DIAGNOSIS — I2692 Saddle embolus of pulmonary artery without acute cor pulmonale: Secondary | ICD-10-CM | POA: Diagnosis not present

## 2016-01-01 DIAGNOSIS — R112 Nausea with vomiting, unspecified: Secondary | ICD-10-CM | POA: Diagnosis not present

## 2016-01-01 DIAGNOSIS — R7303 Prediabetes: Secondary | ICD-10-CM | POA: Diagnosis not present

## 2016-01-01 DIAGNOSIS — Z8249 Family history of ischemic heart disease and other diseases of the circulatory system: Secondary | ICD-10-CM

## 2016-01-01 DIAGNOSIS — Z4781 Encounter for orthopedic aftercare following surgical amputation: Secondary | ICD-10-CM | POA: Diagnosis present

## 2016-01-01 DIAGNOSIS — Z7902 Long term (current) use of antithrombotics/antiplatelets: Secondary | ICD-10-CM | POA: Diagnosis not present

## 2016-01-01 DIAGNOSIS — S299XXA Unspecified injury of thorax, initial encounter: Secondary | ICD-10-CM

## 2016-01-01 DIAGNOSIS — Z09 Encounter for follow-up examination after completed treatment for conditions other than malignant neoplasm: Secondary | ICD-10-CM

## 2016-01-01 DIAGNOSIS — I10 Essential (primary) hypertension: Secondary | ICD-10-CM | POA: Diagnosis present

## 2016-01-01 DIAGNOSIS — R739 Hyperglycemia, unspecified: Secondary | ICD-10-CM | POA: Diagnosis not present

## 2016-01-01 DIAGNOSIS — S78119A Complete traumatic amputation at level between unspecified hip and knee, initial encounter: Secondary | ICD-10-CM

## 2016-01-01 DIAGNOSIS — T82392D Other mechanical complication of femoral arterial graft (bypass), subsequent encounter: Secondary | ICD-10-CM

## 2016-01-01 DIAGNOSIS — J690 Pneumonitis due to inhalation of food and vomit: Secondary | ICD-10-CM | POA: Diagnosis not present

## 2016-01-01 DIAGNOSIS — R1010 Upper abdominal pain, unspecified: Secondary | ICD-10-CM | POA: Diagnosis not present

## 2016-01-01 DIAGNOSIS — E8809 Other disorders of plasma-protein metabolism, not elsewhere classified: Secondary | ICD-10-CM | POA: Diagnosis not present

## 2016-01-01 DIAGNOSIS — K567 Ileus, unspecified: Secondary | ICD-10-CM | POA: Diagnosis not present

## 2016-01-01 DIAGNOSIS — I2510 Atherosclerotic heart disease of native coronary artery without angina pectoris: Secondary | ICD-10-CM | POA: Diagnosis not present

## 2016-01-01 DIAGNOSIS — R0902 Hypoxemia: Secondary | ICD-10-CM

## 2016-01-01 DIAGNOSIS — J9601 Acute respiratory failure with hypoxia: Secondary | ICD-10-CM | POA: Diagnosis not present

## 2016-01-01 DIAGNOSIS — T82868A Thrombosis of vascular prosthetic devices, implants and grafts, initial encounter: Secondary | ICD-10-CM | POA: Diagnosis not present

## 2016-01-01 DIAGNOSIS — J9690 Respiratory failure, unspecified, unspecified whether with hypoxia or hypercapnia: Secondary | ICD-10-CM

## 2016-01-01 DIAGNOSIS — E875 Hyperkalemia: Secondary | ICD-10-CM | POA: Diagnosis not present

## 2016-01-01 DIAGNOSIS — I70212 Atherosclerosis of native arteries of extremities with intermittent claudication, left leg: Secondary | ICD-10-CM | POA: Diagnosis present

## 2016-01-01 DIAGNOSIS — E871 Hypo-osmolality and hyponatremia: Secondary | ICD-10-CM | POA: Diagnosis not present

## 2016-01-01 DIAGNOSIS — Z85828 Personal history of other malignant neoplasm of skin: Secondary | ICD-10-CM | POA: Diagnosis not present

## 2016-01-01 DIAGNOSIS — Z86718 Personal history of other venous thrombosis and embolism: Secondary | ICD-10-CM | POA: Diagnosis not present

## 2016-01-01 DIAGNOSIS — G8918 Other acute postprocedural pain: Secondary | ICD-10-CM

## 2016-01-01 DIAGNOSIS — J189 Pneumonia, unspecified organism: Secondary | ICD-10-CM | POA: Diagnosis not present

## 2016-01-01 DIAGNOSIS — I5189 Other ill-defined heart diseases: Secondary | ICD-10-CM

## 2016-01-01 DIAGNOSIS — E876 Hypokalemia: Secondary | ICD-10-CM | POA: Diagnosis present

## 2016-01-01 DIAGNOSIS — I82509 Chronic embolism and thrombosis of unspecified deep veins of unspecified lower extremity: Secondary | ICD-10-CM

## 2016-01-01 DIAGNOSIS — E8770 Fluid overload, unspecified: Secondary | ICD-10-CM | POA: Diagnosis not present

## 2016-01-01 DIAGNOSIS — S298XXS Other specified injuries of thorax, sequela: Secondary | ICD-10-CM | POA: Diagnosis not present

## 2016-01-01 DIAGNOSIS — G629 Polyneuropathy, unspecified: Secondary | ICD-10-CM | POA: Diagnosis not present

## 2016-01-01 DIAGNOSIS — I7389 Other specified peripheral vascular diseases: Secondary | ICD-10-CM | POA: Diagnosis present

## 2016-01-01 DIAGNOSIS — I959 Hypotension, unspecified: Secondary | ICD-10-CM | POA: Diagnosis not present

## 2016-01-01 DIAGNOSIS — J181 Lobar pneumonia, unspecified organism: Secondary | ICD-10-CM

## 2016-01-01 DIAGNOSIS — D72829 Elevated white blood cell count, unspecified: Secondary | ICD-10-CM

## 2016-01-01 DIAGNOSIS — I519 Heart disease, unspecified: Secondary | ICD-10-CM | POA: Diagnosis not present

## 2016-01-01 HISTORY — PX: FEMORAL-POPLITEAL BYPASS GRAFT: SHX937

## 2016-01-01 HISTORY — PX: ENDARTERECTOMY FEMORAL: SHX5804

## 2016-01-01 HISTORY — PX: INTRAOPERATIVE ARTERIOGRAM: SHX5157

## 2016-01-01 HISTORY — PX: VEIN HARVEST: SHX6363

## 2016-01-01 LAB — APTT: APTT: 28 s (ref 24–36)

## 2016-01-01 LAB — CBC
HEMATOCRIT: 33.6 % — AB (ref 39.0–52.0)
HEMOGLOBIN: 11.4 g/dL — AB (ref 13.0–17.0)
MCH: 29.4 pg (ref 26.0–34.0)
MCHC: 33.9 g/dL (ref 30.0–36.0)
MCV: 86.6 fL (ref 78.0–100.0)
Platelets: 124 10*3/uL — ABNORMAL LOW (ref 150–400)
RBC: 3.88 MIL/uL — AB (ref 4.22–5.81)
RDW: 14 % (ref 11.5–15.5)
WBC: 7.3 10*3/uL (ref 4.0–10.5)

## 2016-01-01 LAB — CREATININE, SERUM: Creatinine, Ser: 1.12 mg/dL (ref 0.61–1.24)

## 2016-01-01 LAB — GLUCOSE, CAPILLARY: Glucose-Capillary: 179 mg/dL — ABNORMAL HIGH (ref 65–99)

## 2016-01-01 LAB — PROTIME-INR
INR: 1
PROTHROMBIN TIME: 13.2 s (ref 11.4–15.2)

## 2016-01-01 SURGERY — BYPASS GRAFT FEMORAL-POPLITEAL ARTERY
Anesthesia: General | Site: Leg Upper | Laterality: Left

## 2016-01-01 MED ORDER — ROCURONIUM BROMIDE 50 MG/5ML IV SOLN
INTRAVENOUS | Status: AC
  Filled 2016-01-01: qty 1

## 2016-01-01 MED ORDER — LACTATED RINGERS IV SOLN
INTRAVENOUS | Status: DC | PRN
  Administered 2016-01-01 (×3): via INTRAVENOUS

## 2016-01-01 MED ORDER — POTASSIUM CHLORIDE CRYS ER 20 MEQ PO TBCR
20.0000 meq | EXTENDED_RELEASE_TABLET | Freq: Every day | ORAL | Status: AC | PRN
  Administered 2016-01-06: 40 meq via ORAL
  Filled 2016-01-01: qty 2

## 2016-01-01 MED ORDER — ONDANSETRON HCL 4 MG/2ML IJ SOLN
INTRAMUSCULAR | Status: DC | PRN
  Administered 2016-01-01: 4 mg via INTRAVENOUS

## 2016-01-01 MED ORDER — PHENYLEPHRINE HCL 10 MG/ML IJ SOLN
INTRAVENOUS | Status: DC | PRN
  Administered 2016-01-01: 30 ug/min via INTRAVENOUS

## 2016-01-01 MED ORDER — NITROGLYCERIN 0.4 MG SL SUBL: 0.4000 mg | SUBLINGUAL_TABLET | SUBLINGUAL | Status: DC | PRN

## 2016-01-01 MED ORDER — CHLORHEXIDINE GLUCONATE CLOTH 2 % EX PADS: 6.0000 | MEDICATED_PAD | Freq: Once | CUTANEOUS | Status: DC

## 2016-01-01 MED ORDER — LIDOCAINE HCL (CARDIAC) 20 MG/ML IV SOLN
INTRAVENOUS | Status: DC | PRN
  Administered 2016-01-01: 20 mg via INTRAVENOUS

## 2016-01-01 MED ORDER — THROMBIN 20000 UNITS EX SOLR
CUTANEOUS | Status: AC
  Filled 2016-01-01: qty 20000

## 2016-01-01 MED ORDER — CHLORTHALIDONE 25 MG PO TABS
12.5000 mg | ORAL_TABLET | Freq: Every day | ORAL | Status: DC
  Administered 2016-01-02 – 2016-01-07 (×5): 12.5 mg via ORAL
  Filled 2016-01-01 (×9): qty 0.5

## 2016-01-01 MED ORDER — PROPOFOL 10 MG/ML IV BOLUS
INTRAVENOUS | Status: AC
  Filled 2016-01-01: qty 20

## 2016-01-01 MED ORDER — NIFEDIPINE ER OSMOTIC RELEASE 90 MG PO TB24: 90.0000 mg | ORAL_TABLET | Freq: Every evening | ORAL | Status: DC

## 2016-01-01 MED ORDER — FENTANYL CITRATE (PF) 100 MCG/2ML IJ SOLN
INTRAMUSCULAR | Status: AC
  Filled 2016-01-01: qty 2

## 2016-01-01 MED ORDER — PHENYLEPHRINE HCL 10 MG/ML IJ SOLN
INTRAMUSCULAR | Status: DC | PRN
  Administered 2016-01-01: 80 ug via INTRAVENOUS
  Administered 2016-01-01: 120 ug via INTRAVENOUS
  Administered 2016-01-01: 80 ug via INTRAVENOUS

## 2016-01-01 MED ORDER — ONDANSETRON HCL 4 MG/2ML IJ SOLN
INTRAMUSCULAR | Status: AC
  Filled 2016-01-01: qty 2

## 2016-01-01 MED ORDER — 0.9 % SODIUM CHLORIDE (POUR BTL) OPTIME
TOPICAL | Status: DC | PRN
Start: 2016-01-01 — End: 2016-01-01
  Administered 2016-01-01: 2000 mL

## 2016-01-01 MED ORDER — ALBUMIN HUMAN 5 % IV SOLN
INTRAVENOUS | Status: AC
  Administered 2016-01-01: 12.5 g
  Filled 2016-01-01: qty 250

## 2016-01-01 MED ORDER — ENOXAPARIN SODIUM 30 MG/0.3ML ~~LOC~~ SOLN: 30.0000 mg | SUBCUTANEOUS | Status: DC

## 2016-01-01 MED ORDER — ROCURONIUM BROMIDE 50 MG/5ML IV SOLN
INTRAVENOUS | Status: AC
Start: 2016-01-01 — End: 2016-01-01
  Filled 2016-01-01: qty 1

## 2016-01-01 MED ORDER — LABETALOL HCL 5 MG/ML IV SOLN: 10.0000 mg | INTRAVENOUS | Status: DC | PRN

## 2016-01-01 MED ORDER — SODIUM CHLORIDE 0.9 % IV SOLN
INTRAVENOUS | Status: DC | PRN
  Administered 2016-01-01: 500 mL

## 2016-01-01 MED ORDER — DOCUSATE SODIUM 100 MG PO CAPS
100.0000 mg | ORAL_CAPSULE | Freq: Every day | ORAL | Status: DC
  Administered 2016-01-03 – 2016-01-16 (×5): 100 mg via ORAL
  Filled 2016-01-01 (×8): qty 1

## 2016-01-01 MED ORDER — HYDROMORPHONE HCL 1 MG/ML IJ SOLN: 0.2500 mg | INTRAMUSCULAR | Status: DC | PRN

## 2016-01-01 MED ORDER — IOPAMIDOL (ISOVUE-300) INJECTION 61%
INTRAVENOUS | Status: AC
  Filled 2016-01-01: qty 50

## 2016-01-01 MED ORDER — FENTANYL CITRATE (PF) 250 MCG/5ML IJ SOLN
INTRAMUSCULAR | Status: AC
  Filled 2016-01-01: qty 5

## 2016-01-01 MED ORDER — ALBUMIN HUMAN 5 % IV SOLN
12.5000 g | Freq: Once | INTRAVENOUS | Status: AC
  Administered 2016-01-01: 12.5 g via INTRAVENOUS

## 2016-01-01 MED ORDER — HEPARIN SODIUM (PORCINE) 1000 UNIT/ML IJ SOLN
INTRAMUSCULAR | Status: DC | PRN
  Administered 2016-01-01: 4000 [IU] via INTRAVENOUS
  Administered 2016-01-01: 8000 [IU] via INTRAVENOUS

## 2016-01-01 MED ORDER — PROPOFOL 10 MG/ML IV BOLUS
INTRAVENOUS | Status: DC | PRN
  Administered 2016-01-01: 120 mg via INTRAVENOUS
  Administered 2016-01-01: 40 mg via INTRAVENOUS

## 2016-01-01 MED ORDER — EPHEDRINE 5 MG/ML INJ
INTRAVENOUS | Status: AC
  Filled 2016-01-01: qty 10

## 2016-01-01 MED ORDER — SUGAMMADEX SODIUM 200 MG/2ML IV SOLN
INTRAVENOUS | Status: DC | PRN
  Administered 2016-01-01: 150 mg via INTRAVENOUS

## 2016-01-01 MED ORDER — DIPHENOXYLATE-ATROPINE 2.5-0.025 MG PO TABS
1.0000 | ORAL_TABLET | Freq: Every day | ORAL | Status: DC | PRN
  Administered 2016-01-04 – 2016-01-05 (×2): 1 via ORAL
  Filled 2016-01-01 (×2): qty 1

## 2016-01-01 MED ORDER — MORPHINE SULFATE (PF) 2 MG/ML IV SOLN
2.0000 mg | INTRAVENOUS | Status: DC | PRN
  Administered 2016-01-02: 2 mg via INTRAVENOUS
  Administered 2016-01-07: 3 mg via INTRAVENOUS
  Filled 2016-01-01 (×2): qty 1
  Filled 2016-01-01: qty 2

## 2016-01-01 MED ORDER — MAGNESIUM HYDROXIDE 400 MG/5ML PO SUSP
30.0000 mL | Freq: Every day | ORAL | Status: DC | PRN
  Filled 2016-01-01: qty 30

## 2016-01-01 MED ORDER — FENTANYL CITRATE (PF) 100 MCG/2ML IJ SOLN
INTRAMUSCULAR | Status: DC | PRN
  Administered 2016-01-01: 50 ug via INTRAVENOUS

## 2016-01-01 MED ORDER — NIACIN ER (ANTIHYPERLIPIDEMIC) 750 MG PO TBCR: 750.0000 mg | EXTENDED_RELEASE_TABLET | Freq: Every day | ORAL | Status: DC

## 2016-01-01 MED ORDER — OXYCODONE-ACETAMINOPHEN 5-325 MG PO TABS
1.0000 | ORAL_TABLET | ORAL | Status: DC | PRN
  Administered 2016-01-04 – 2016-01-05 (×3): 1 via ORAL
  Administered 2016-01-05: 2 via ORAL
  Administered 2016-01-05: 1 via ORAL
  Administered 2016-01-06 – 2016-01-10 (×3): 2 via ORAL
  Administered 2016-01-13: 1 via ORAL
  Filled 2016-01-01 (×5): qty 1
  Filled 2016-01-01 (×2): qty 2
  Filled 2016-01-01: qty 1
  Filled 2016-01-01 (×2): qty 2

## 2016-01-01 MED ORDER — LIDOCAINE HCL (CARDIAC) 20 MG/ML IV SOLN
INTRAVENOUS | Status: DC | PRN
  Administered 2016-01-01: 50 mg via INTRAVENOUS

## 2016-01-01 MED ORDER — FENTANYL CITRATE (PF) 100 MCG/2ML IJ SOLN
25.0000 ug | INTRAMUSCULAR | Status: DC | PRN
  Administered 2016-01-01: 25 ug via INTRAVENOUS

## 2016-01-01 MED ORDER — METOPROLOL SUCCINATE ER 50 MG PO TB24: 100.0000 mg | ORAL_TABLET | Freq: Every day | ORAL | Status: DC

## 2016-01-01 MED ORDER — PAPAVERINE HCL 30 MG/ML IJ SOLN
INTRAMUSCULAR | Status: AC
  Filled 2016-01-01: qty 2

## 2016-01-01 MED ORDER — ACETAMINOPHEN 325 MG RE SUPP
325.0000 mg | RECTAL | Status: DC | PRN
  Filled 2016-01-01: qty 2

## 2016-01-01 MED ORDER — PANTOPRAZOLE SODIUM 40 MG PO TBEC
40.0000 mg | DELAYED_RELEASE_TABLET | Freq: Every day | ORAL | Status: DC
  Administered 2016-01-02 – 2016-01-05 (×4): 40 mg via ORAL
  Filled 2016-01-01 (×6): qty 1

## 2016-01-01 MED ORDER — PHENYLEPHRINE 40 MCG/ML (10ML) SYRINGE FOR IV PUSH (FOR BLOOD PRESSURE SUPPORT)
PREFILLED_SYRINGE | INTRAVENOUS | Status: AC
  Filled 2016-01-01: qty 10

## 2016-01-01 MED ORDER — GLYCOPYRROLATE 0.2 MG/ML IV SOSY
PREFILLED_SYRINGE | INTRAVENOUS | Status: AC
  Filled 2016-01-01: qty 3

## 2016-01-01 MED ORDER — DEXTROSE 5 % IV SOLN: INTRAVENOUS | Status: DC | PRN

## 2016-01-01 MED ORDER — DEXTROSE 5 % IV SOLN
1.5000 g | Freq: Two times a day (BID) | INTRAVENOUS | Status: AC
  Administered 2016-01-02 (×2): 1.5 g via INTRAVENOUS
  Filled 2016-01-01 (×2): qty 1.5

## 2016-01-01 MED ORDER — SODIUM CHLORIDE 0.9 % IV SOLN: 500.0000 mL | Freq: Once | INTRAVENOUS | Status: DC | PRN

## 2016-01-01 MED ORDER — SCOPOLAMINE 1 MG/3DAYS TD PT72
MEDICATED_PATCH | TRANSDERMAL | Status: DC | PRN
  Administered 2016-01-01: 1 via TRANSDERMAL

## 2016-01-01 MED ORDER — HEPARIN SODIUM (PORCINE) 1000 UNIT/ML IJ SOLN
INTRAMUSCULAR | Status: DC | PRN
  Administered 2016-01-01: 8000 [IU] via INTRAVENOUS

## 2016-01-01 MED ORDER — EPHEDRINE SULFATE 50 MG/ML IJ SOLN
INTRAMUSCULAR | Status: DC | PRN
  Administered 2016-01-01: 5 mg via INTRAVENOUS

## 2016-01-01 MED ORDER — MEPERIDINE HCL 25 MG/ML IJ SOLN: 6.2500 mg | INTRAMUSCULAR | Status: DC | PRN

## 2016-01-01 MED ORDER — HEPARIN SODIUM (PORCINE) 1000 UNIT/ML IJ SOLN
INTRAMUSCULAR | Status: AC
  Filled 2016-01-01: qty 2

## 2016-01-01 MED ORDER — HEPARIN SODIUM (PORCINE) 1000 UNIT/ML IJ SOLN
INTRAMUSCULAR | Status: AC
  Filled 2016-01-01: qty 1

## 2016-01-01 MED ORDER — PHENOL 1.4 % MT LIQD
1.0000 | OROMUCOSAL | Status: DC | PRN
Start: 2016-01-01 — End: 2016-01-17

## 2016-01-01 MED ORDER — PROPOFOL 10 MG/ML IV BOLUS
INTRAVENOUS | Status: DC | PRN
Start: 2016-01-01 — End: 2016-01-01
  Administered 2016-01-01: 150 mg via INTRAVENOUS

## 2016-01-01 MED ORDER — DEXAMETHASONE SODIUM PHOSPHATE 10 MG/ML IJ SOLN
INTRAMUSCULAR | Status: AC
  Filled 2016-01-01: qty 1

## 2016-01-01 MED ORDER — PROTAMINE SULFATE 10 MG/ML IV SOLN
INTRAVENOUS | Status: AC
  Filled 2016-01-01: qty 10

## 2016-01-01 MED ORDER — METOPROLOL TARTRATE 5 MG/5ML IV SOLN
2.0000 mg | INTRAVENOUS | Status: DC | PRN
  Administered 2016-01-11: 5 mg via INTRAVENOUS
  Filled 2016-01-01: qty 5

## 2016-01-01 MED ORDER — PROMETHAZINE HCL 25 MG/ML IJ SOLN: 6.2500 mg | INTRAMUSCULAR | Status: DC | PRN

## 2016-01-01 MED ORDER — FENTANYL CITRATE (PF) 100 MCG/2ML IJ SOLN
INTRAMUSCULAR | Status: DC | PRN
  Administered 2016-01-01: 150 ug via INTRAVENOUS
  Administered 2016-01-01 (×6): 50 ug via INTRAVENOUS

## 2016-01-01 MED ORDER — IOPAMIDOL (ISOVUE-300) INJECTION 61%
INTRAVENOUS | Status: DC | PRN
  Administered 2016-01-01 (×2): 50 mL via INTRA_ARTERIAL

## 2016-01-01 MED ORDER — GLYCOPYRROLATE 0.2 MG/ML IJ SOLN
INTRAMUSCULAR | Status: DC | PRN
  Administered 2016-01-01: .4 mg via INTRAVENOUS

## 2016-01-01 MED ORDER — ONDANSETRON HCL 4 MG/2ML IJ SOLN
4.0000 mg | Freq: Four times a day (QID) | INTRAMUSCULAR | Status: DC | PRN
  Administered 2016-01-01 – 2016-01-11 (×3): 4 mg via INTRAVENOUS
  Filled 2016-01-01 (×2): qty 2

## 2016-01-01 MED ORDER — DEXAMETHASONE SODIUM PHOSPHATE 10 MG/ML IJ SOLN
INTRAMUSCULAR | Status: DC | PRN
  Administered 2016-01-01: 5 mg via INTRAVENOUS

## 2016-01-01 MED ORDER — EPHEDRINE SULFATE 50 MG/ML IJ SOLN
INTRAMUSCULAR | Status: DC | PRN
  Administered 2016-01-01: 15 mg via INTRAVENOUS
  Administered 2016-01-01: 10 mg via INTRAVENOUS

## 2016-01-01 MED ORDER — ISOSORBIDE MONONITRATE ER 30 MG PO TB24
30.0000 mg | ORAL_TABLET | Freq: Two times a day (BID) | ORAL | Status: DC
  Administered 2016-01-02 – 2016-01-14 (×20): 30 mg via ORAL
  Filled 2016-01-01 (×22): qty 1

## 2016-01-01 MED ORDER — ACETAMINOPHEN 325 MG PO TABS
325.0000 mg | ORAL_TABLET | ORAL | Status: DC | PRN
  Administered 2016-01-14 – 2016-01-16 (×6): 650 mg via ORAL
  Filled 2016-01-01 (×7): qty 2

## 2016-01-01 MED ORDER — BISACODYL 10 MG RE SUPP: 10.0000 mg | Freq: Every day | RECTAL | Status: DC | PRN

## 2016-01-01 MED ORDER — MIDAZOLAM HCL 2 MG/2ML IJ SOLN: 0.5000 mg | Freq: Once | INTRAMUSCULAR | Status: DC | PRN

## 2016-01-01 MED ORDER — SUCCINYLCHOLINE CHLORIDE 200 MG/10ML IV SOSY
PREFILLED_SYRINGE | INTRAVENOUS | Status: AC
  Filled 2016-01-01: qty 10

## 2016-01-01 MED ORDER — DEXTROSE 5 % IV SOLN
INTRAVENOUS | Status: DC | PRN
  Administered 2016-01-01: 1.5 g via INTRAVENOUS

## 2016-01-01 MED ORDER — PROMETHAZINE HCL 25 MG/ML IJ SOLN
6.2500 mg | INTRAMUSCULAR | Status: DC | PRN
  Filled 2016-01-01: qty 1

## 2016-01-01 MED ORDER — ALUM & MAG HYDROXIDE-SIMETH 200-200-20 MG/5ML PO SUSP
15.0000 mL | ORAL | Status: DC | PRN
  Administered 2016-01-02 (×2): 30 mL via ORAL
  Filled 2016-01-01 (×2): qty 30

## 2016-01-01 MED ORDER — GUAIFENESIN-DM 100-10 MG/5ML PO SYRP: 15.0000 mL | ORAL_SOLUTION | ORAL | Status: DC | PRN

## 2016-01-01 MED ORDER — DEXTROSE 5 % IV SOLN
INTRAVENOUS | Status: AC
  Filled 2016-01-01: qty 1.5

## 2016-01-01 MED ORDER — PROTAMINE SULFATE 10 MG/ML IV SOLN
INTRAVENOUS | Status: DC | PRN
  Administered 2016-01-01: 10 mg via INTRAVENOUS
  Administered 2016-01-01: 70 mg via INTRAVENOUS

## 2016-01-01 MED ORDER — PHENYLEPHRINE HCL 10 MG/ML IJ SOLN
INTRAVENOUS | Status: DC | PRN
  Administered 2016-01-01: 25 ug/min via INTRAVENOUS

## 2016-01-01 MED ORDER — HYDRALAZINE HCL 20 MG/ML IJ SOLN: 5.0000 mg | INTRAMUSCULAR | Status: DC | PRN

## 2016-01-01 MED ORDER — ROCURONIUM BROMIDE 100 MG/10ML IV SOLN
INTRAVENOUS | Status: DC | PRN
  Administered 2016-01-01: 50 mg via INTRAVENOUS
  Administered 2016-01-01: 10 mg via INTRAVENOUS
  Administered 2016-01-01 (×2): 20 mg via INTRAVENOUS

## 2016-01-01 MED ORDER — DEXTROSE 5 % IV SOLN
1.5000 g | INTRAVENOUS | Status: AC
  Administered 2016-01-01: 1.5 g via INTRAVENOUS
  Filled 2016-01-01: qty 1.5

## 2016-01-01 MED ORDER — 0.9 % SODIUM CHLORIDE (POUR BTL) OPTIME
TOPICAL | Status: DC | PRN
  Administered 2016-01-01: 2000 mL

## 2016-01-01 MED ORDER — PHENYLEPHRINE HCL 10 MG/ML IJ SOLN
INTRAMUSCULAR | Status: DC | PRN
  Administered 2016-01-01 (×2): 120 ug via INTRAVENOUS

## 2016-01-01 MED ORDER — SODIUM CHLORIDE 0.9 % IV SOLN: INTRAVENOUS | Status: DC

## 2016-01-01 MED ORDER — ENALAPRIL MALEATE 20 MG PO TABS
20.0000 mg | ORAL_TABLET | Freq: Two times a day (BID) | ORAL | Status: DC
  Administered 2016-01-02 – 2016-01-10 (×10): 20 mg via ORAL
  Filled 2016-01-01: qty 1
  Filled 2016-01-01 (×3): qty 2
  Filled 2016-01-01 (×2): qty 1
  Filled 2016-01-01 (×3): qty 2
  Filled 2016-01-01: qty 1
  Filled 2016-01-01: qty 2
  Filled 2016-01-01 (×4): qty 1
  Filled 2016-01-01: qty 2
  Filled 2016-01-01: qty 1

## 2016-01-01 MED ORDER — LACTATED RINGERS IV SOLN
INTRAVENOUS | Status: DC | PRN
  Administered 2016-01-01: 17:00:00 via INTRAVENOUS

## 2016-01-01 MED ORDER — LIDOCAINE 2% (20 MG/ML) 5 ML SYRINGE
INTRAMUSCULAR | Status: AC
  Filled 2016-01-01: qty 5

## 2016-01-01 MED ORDER — HEPARIN (PORCINE) IN NACL 100-0.45 UNIT/ML-% IJ SOLN
500.0000 [IU]/h | INTRAMUSCULAR | Status: DC
  Administered 2016-01-01: 500 [IU]/h via INTRAVENOUS
  Filled 2016-01-01: qty 250

## 2016-01-01 MED ORDER — METOPROLOL SUCCINATE ER 50 MG PO TB24
100.0000 mg | ORAL_TABLET | Freq: Every day | ORAL | Status: DC
  Administered 2016-01-02 – 2016-01-07 (×5): 100 mg via ORAL
  Filled 2016-01-01 (×3): qty 1
  Filled 2016-01-01 (×2): qty 2
  Filled 2016-01-01: qty 1

## 2016-01-01 MED ORDER — ATORVASTATIN CALCIUM 40 MG PO TABS
40.0000 mg | ORAL_TABLET | Freq: Every evening | ORAL | Status: DC
  Administered 2016-01-02 – 2016-01-05 (×5): 40 mg via ORAL
  Filled 2016-01-01 (×7): qty 1

## 2016-01-01 MED ORDER — NIFEDIPINE ER OSMOTIC RELEASE 90 MG PO TB24
90.0000 mg | ORAL_TABLET | Freq: Every day | ORAL | Status: DC
  Administered 2016-01-02 – 2016-01-07 (×6): 90 mg via ORAL
  Filled 2016-01-01: qty 3
  Filled 2016-01-01 (×4): qty 1
  Filled 2016-01-01 (×3): qty 3

## 2016-01-01 MED ORDER — NIACIN ER 500 MG PO CPCR
750.0000 mg | ORAL_CAPSULE | Freq: Every day | ORAL | Status: DC
  Administered 2016-01-02 – 2016-01-07 (×5): 750 mg via ORAL
  Filled 2016-01-01 (×8): qty 1

## 2016-01-01 SURGICAL SUPPLY — 63 items
BAG ISOLATION DRAPE 18X18 (DRAPES) ×2 IMPLANT
BANDAGE ESMARK 6X9 LF (GAUZE/BANDAGES/DRESSINGS) IMPLANT
BNDG ESMARK 6X9 LF (GAUZE/BANDAGES/DRESSINGS)
CANISTER SUCTION 2500CC (MISCELLANEOUS) ×3 IMPLANT
CANNULA VESSEL 3MM 2 BLNT TIP (CANNULA) ×6 IMPLANT
CLIP TI MEDIUM 24 (CLIP) ×3 IMPLANT
CLIP TI WIDE RED SMALL 24 (CLIP) ×3 IMPLANT
COVER SURGICAL LIGHT HANDLE (MISCELLANEOUS) ×3 IMPLANT
CUFF TOURNIQUET SINGLE 24IN (TOURNIQUET CUFF) IMPLANT
CUFF TOURNIQUET SINGLE 34IN LL (TOURNIQUET CUFF) IMPLANT
CUFF TOURNIQUET SINGLE 44IN (TOURNIQUET CUFF) IMPLANT
DRAIN SNY WOU (WOUND CARE) IMPLANT
DRAPE ISOLATION BAG 18X18 (DRAPES) ×1
DRAPE PROXIMA HALF (DRAPES) IMPLANT
DRAPE X-RAY CASS 24X20 (DRAPES) IMPLANT
ELECT REM PT RETURN 9FT ADLT (ELECTROSURGICAL) ×3
ELECTRODE REM PT RTRN 9FT ADLT (ELECTROSURGICAL) ×2 IMPLANT
EVACUATOR SILICONE 100CC (DRAIN) IMPLANT
GAUZE SPONGE 4X4 16PLY XRAY LF (GAUZE/BANDAGES/DRESSINGS) ×3 IMPLANT
GLOVE BIO SURGEON STRL SZ 6.5 (GLOVE) ×18 IMPLANT
GLOVE BIO SURGEON STRL SZ7.5 (GLOVE) ×9 IMPLANT
GLOVE BIOGEL PI IND STRL 6.5 (GLOVE) ×2 IMPLANT
GLOVE BIOGEL PI IND STRL 7.0 (GLOVE) ×2 IMPLANT
GLOVE BIOGEL PI IND STRL 8 (GLOVE) ×2 IMPLANT
GLOVE BIOGEL PI INDICATOR 6.5 (GLOVE) ×1
GLOVE BIOGEL PI INDICATOR 7.0 (GLOVE) ×1
GLOVE BIOGEL PI INDICATOR 8 (GLOVE) ×1
GLOVE ECLIPSE 6.5 STRL STRAW (GLOVE) ×6 IMPLANT
GOWN STRL REUS W/ TWL LRG LVL3 (GOWN DISPOSABLE) ×8 IMPLANT
GOWN STRL REUS W/TWL LRG LVL3 (GOWN DISPOSABLE) ×4
HEMOSTAT SPONGE AVITENE ULTRA (HEMOSTASIS) IMPLANT
KIT BASIN OR (CUSTOM PROCEDURE TRAY) ×3 IMPLANT
KIT ROOM TURNOVER OR (KITS) ×3 IMPLANT
LIQUID BAND (GAUZE/BANDAGES/DRESSINGS) ×9 IMPLANT
LOOP VESSEL MAXI BLUE (MISCELLANEOUS) ×3 IMPLANT
NS IRRIG 1000ML POUR BTL (IV SOLUTION) ×6 IMPLANT
PACK PERIPHERAL VASCULAR (CUSTOM PROCEDURE TRAY) ×3 IMPLANT
PAD ARMBOARD 7.5X6 YLW CONV (MISCELLANEOUS) ×6 IMPLANT
PADDING CAST COTTON 6X4 STRL (CAST SUPPLIES) IMPLANT
SET COLLECT BLD 21X3/4 12 (NEEDLE) IMPLANT
STAPLER VISISTAT 35W (STAPLE) IMPLANT
STOPCOCK 4 WAY LG BORE MALE ST (IV SETS) IMPLANT
SUT PROLENE 5 0 C 1 24 (SUTURE) ×3 IMPLANT
SUT PROLENE 6 0 CC (SUTURE) ×3 IMPLANT
SUT PROLENE 7 0 BV 1 (SUTURE) ×6 IMPLANT
SUT PROLENE 7 0 BV1 MDA (SUTURE) IMPLANT
SUT SILK 2 0 (SUTURE) ×1
SUT SILK 2 0 SH (SUTURE) ×3 IMPLANT
SUT SILK 2-0 18XBRD TIE 12 (SUTURE) ×2 IMPLANT
SUT SILK 3 0 (SUTURE) ×4
SUT SILK 3-0 18XBRD TIE 12 (SUTURE) ×8 IMPLANT
SUT VIC AB 2-0 SH 27 (SUTURE) ×2
SUT VIC AB 2-0 SH 27XBRD (SUTURE) ×4 IMPLANT
SUT VIC AB 3-0 SH 27 (SUTURE) ×5
SUT VIC AB 3-0 SH 27X BRD (SUTURE) ×10 IMPLANT
SUT VIC AB 4-0 PS2 27 (SUTURE) ×6 IMPLANT
SUT VICRYL 4-0 PS2 18IN ABS (SUTURE) ×3 IMPLANT
TAPE UMBILICAL COTTON 1/8X30 (MISCELLANEOUS) IMPLANT
TOWEL OR 17X24 6PK STRL BLUE (TOWEL DISPOSABLE) ×3 IMPLANT
TRAY FOLEY W/METER SILVER 16FR (SET/KITS/TRAYS/PACK) ×3 IMPLANT
TUBING EXTENTION W/L.L. (IV SETS) IMPLANT
UNDERPAD 30X30 INCONTINENT (UNDERPADS AND DIAPERS) ×3 IMPLANT
WATER STERILE IRR 1000ML POUR (IV SOLUTION) ×3 IMPLANT

## 2016-01-01 SURGICAL SUPPLY — 52 items
BANDAGE ESMARK 6X9 LF (GAUZE/BANDAGES/DRESSINGS) IMPLANT
BNDG ESMARK 6X9 LF (GAUZE/BANDAGES/DRESSINGS)
CANISTER SUCTION 2500CC (MISCELLANEOUS) ×2 IMPLANT
CANNULA VESSEL 3MM 2 BLNT TIP (CANNULA) ×2 IMPLANT
CATH EMB 3FR 80CM (CATHETERS) ×2 IMPLANT
CATH EMB 4FR 80CM (CATHETERS) ×2 IMPLANT
CLIP TI MEDIUM 24 (CLIP) ×2 IMPLANT
CLIP TI WIDE RED SMALL 24 (CLIP) ×2 IMPLANT
CUFF TOURNIQUET SINGLE 24IN (TOURNIQUET CUFF) IMPLANT
CUFF TOURNIQUET SINGLE 34IN LL (TOURNIQUET CUFF) IMPLANT
CUFF TOURNIQUET SINGLE 44IN (TOURNIQUET CUFF) IMPLANT
DRAIN SNY WOU (WOUND CARE) IMPLANT
DRAPE PROXIMA HALF (DRAPES) IMPLANT
DRAPE X-RAY CASS 24X20 (DRAPES) ×2 IMPLANT
ELECT REM PT RETURN 9FT ADLT (ELECTROSURGICAL) ×2
ELECTRODE REM PT RTRN 9FT ADLT (ELECTROSURGICAL) ×1 IMPLANT
EVACUATOR SILICONE 100CC (DRAIN) IMPLANT
GLOVE BIO SURGEON STRL SZ 6.5 (GLOVE) ×12 IMPLANT
GLOVE BIO SURGEON STRL SZ7.5 (GLOVE) ×2 IMPLANT
GLOVE BIOGEL PI IND STRL 6.5 (GLOVE) ×2 IMPLANT
GLOVE BIOGEL PI INDICATOR 6.5 (GLOVE) ×2
GOWN STRL REUS W/ TWL LRG LVL3 (GOWN DISPOSABLE) ×3 IMPLANT
GOWN STRL REUS W/TWL LRG LVL3 (GOWN DISPOSABLE) ×3
HEMOSTAT SPONGE AVITENE ULTRA (HEMOSTASIS) IMPLANT
KIT BASIN OR (CUSTOM PROCEDURE TRAY) ×2 IMPLANT
KIT ROOM TURNOVER OR (KITS) ×2 IMPLANT
LIQUID BAND (GAUZE/BANDAGES/DRESSINGS) ×2 IMPLANT
NS IRRIG 1000ML POUR BTL (IV SOLUTION) ×4 IMPLANT
PACK PERIPHERAL VASCULAR (CUSTOM PROCEDURE TRAY) ×2 IMPLANT
PAD ARMBOARD 7.5X6 YLW CONV (MISCELLANEOUS) ×4 IMPLANT
PADDING CAST COTTON 6X4 STRL (CAST SUPPLIES) IMPLANT
SET COLLECT BLD 21X3/4 12 (NEEDLE) ×2 IMPLANT
STAPLER VISISTAT 35W (STAPLE) IMPLANT
STOPCOCK 4 WAY LG BORE MALE ST (IV SETS) ×2 IMPLANT
SUT PROLENE 5 0 C 1 24 (SUTURE) ×2 IMPLANT
SUT PROLENE 6 0 CC (SUTURE) ×4 IMPLANT
SUT PROLENE 7 0 BV 1 (SUTURE) IMPLANT
SUT PROLENE 7 0 BV1 MDA (SUTURE) IMPLANT
SUT SILK 2 0 SH (SUTURE) ×2 IMPLANT
SUT SILK 3 0 (SUTURE)
SUT SILK 3-0 18XBRD TIE 12 (SUTURE) IMPLANT
SUT VIC AB 2-0 SH 27 (SUTURE) ×1
SUT VIC AB 2-0 SH 27XBRD (SUTURE) ×1 IMPLANT
SUT VIC AB 3-0 SH 27 (SUTURE) ×4
SUT VIC AB 3-0 SH 27X BRD (SUTURE) ×4 IMPLANT
SUT VIC AB 4-0 PS2 27 (SUTURE) ×4 IMPLANT
SYRINGE 3CC LL L/F (MISCELLANEOUS) ×4 IMPLANT
TAPE UMBILICAL COTTON 1/8X30 (MISCELLANEOUS) IMPLANT
TRAY FOLEY W/METER SILVER 16FR (SET/KITS/TRAYS/PACK) IMPLANT
TUBING EXTENTION W/L.L. (IV SETS) ×2 IMPLANT
UNDERPAD 30X30 INCONTINENT (UNDERPADS AND DIAPERS) ×2 IMPLANT
WATER STERILE IRR 1000ML POUR (IV SOLUTION) ×2 IMPLANT

## 2016-01-01 NOTE — Op Note (Signed)
Procedure: Left femoral endarterectomy, Left femoral to above-knee popliteal bypass Preoperative diagnosis: Claudication left leg Postoperative diagnosis: Same  Anesthesia: General  Asst.: Leontine Locket, PA-C  Operative findings: #1 7 mm vein tapering to 3.5 mm right greater saphenous vein non reversed femoral to above-knee popliteal bypass   Operative details: After obtaining informed consent, the patient was taken to the operating room. The patient was placed in supine position on the operating room table. After induction of general anesthesia and endotracheal intubation, a Foley catheter was placed. Next, the patient's entire left lower extremity was prepped and draped in the usual sterile fashion. A longitudinal incision was then made in the left groin and carried down through the subcutaneous tissues to expose the left common femoral artery. The common femoral artery was dissected free circumferentially. There was a pulse within the common femoral artery. There was a large amount of eccentric plaque.  The distal external iliac artery was dissected free circumferentially underneath the inguinal ligament. A vessel loop was also placed around the distal external iliac artery. Dissection was then carried out the level of the femoral bifurcation. The superficial femoral and profunda femoris arteries were dissected free circumferentially and vessel loops placed around these.  Next the right greater saphenous vein was harvested through several skip incisions in the medial right leg. The vein was of good quality approximately 7 mm tapering to 3.5 mm. Side branches were ligated and divided between silk ties or clips.  Next, a longitudinal incision was made on the medial aspect of the right leg above the knee. The greater saphenous vein was also harvested through this incision. Small side branches were ligated and divided between silk ties or clips. The vein was reflected posteriorly. The incision was deepened  down to the level of the fascia. The fascia was opened and the sartorius muscle reflected posteriorly. The above-knee popliteal space was entered. Dissection was carried down to the level of the above-knee popliteal artery this was dissected free circumferentially.Several centimeters of the popliteal artery was dissected free to make a reasonable area for grafting of the above-knee popliteal distal anastomosis.  A subsartorial tunnel was created connecting the above-knee incision to the groin. The patient was then given 8000 units of intravenous heparin and an additional 4000 units of heparin during the case. The greater saphenous vein was ligated distally and transected. The saphenofemoral junction was ligated with a 2 0 silk tie. The vein was gently distended with heparinized saline.  After appropriate circulation time of the heparin, the distal right external iliac artery was controlled with a Henley clamp. The profunda femoris and superficial femoral arteries were controlled with Vesseloops. A longitudinal opening was made in the common femoral artery just above the femoral bifurcation. There was a large amount of plaque obstructing about 70% of the lumen.  I did a femoral endarterectomy extending from the distal external iliac artery to the femoral bifurcation.  The was then spatulated and placed in a non reversed configuration and sewn end of vein to side of artery using a running 5-0 Prolene suture. A valvulotome was then used to lyse all the valves and there was good pulsatile flow through the graft. Graft was marked for orientation.  The graft was then brought through the subsartorial tunnel down to the above-knee popliteal artery. The above-knee popliteal artery was controlled proximally with a Henley clamp and distally with a small Cooley clamp. A longitudinal opening was made in the distal above-knee popliteal artery in an area that was fairly  free of calcification. The graft was then cut to length  and spatulated and signed end of graft to side of artery using running 6-0 Prolene suture. At completion of the anastomosis everything was forebled backbled and thoroughly flushed. The remainder of the anastomosis was completed and all clamps were removed restoring pulsatile flow to the above-knee popliteal artery.  The patient had monophasic to biphasic Doppler flow in the dorsalis pedis area of the foot. This augmented approximately 100% with unclamping the graft.  After hemostasis was obtained, the deep layers and subcutaneous layers of the above-knee popliteal incision were closed with running 3-0 Vicryl suture. The skin was closed with a 4-0 Vicryl subcuticular stitch. The groin was inspected and found to be hemostatic. This was then closed in multiple layers of running 2 0 and 3-0 Vicryl suture and 4-0 subcuticular stitch. The patient tolerated the procedure well and there were no complications. Instrument sponge and needle counts correct in the case. Patient was taken to the recovery in stable condition.    Ruta Hinds, MD  [facility]  Office: [phone]  Pager: [phone]

## 2016-01-01 NOTE — H&P (View-Only) (Signed)
Patient name: Raymond Jacobson      MRN: [id number]      DOB: [birth year]        Sex: male  REASON FOR CONSULT: Left leg claudication  HPI: Raymond Jacobson is a 75 y.o. male,  who has a 4-5 month history of pain that radiates from his left foot up to his left hip after walking about a quarter mile. He underwent left superficial femoral artery stenting by Dr. Trula Slade approximately one month ago. His symptoms have not improved. After resting for several minutes this dissipates. He denies rest pain. No history of nonhealing wounds. He denies history of diabetes. He does occasionally get some angina. He does not get angina at rest. Other medical problems include coronary artery disease, hyperlipidemia, hypertension which are currently stable. He is a former smoker but quit almost 40 years ago. He states he is on Pradaxa for his coronary stents. Patient is very frustrated that he can no longer perform the activities that he enjoys such as artery competitions and pickle ball. He states that he believes his quality of life is diminished secondary to this.    Past Medical History    Diagnosis   Date    .   CAD (coronary artery disease)             Previous stents last 6 years ago [facility]    .   Elevated lipids       .   Hypertension       .   Angina       .   Myocardial infarction [facility])   ~ 2006          "blood work"    .   Peripheral vascular disease ([place])       .   Dry cough       .   Shortness of breath   04/13/11          "mostly w/exertion"    .   Complication of anesthesia       .   PONV (postoperative nausea and vomiting)        Past Surgical History    Procedure   Laterality   Date    .   Tricep surgery      ~ 2007          "right arm; tore all my muscles @ my elbow"    .   Tonsillectomy                "when I was a young one"    .   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   Coronary angioplasty with stent placement      ~ 2004; ~ 2006; 04/13/11          "#  of stents 1 (i2004);+2 (2006)+ 1 (04/13/11)= 4 total"    .   Left heart catheterization with coronary angiogram   N/A   04/13/2011          Procedure: LEFT HEART CATHETERIZATION WITH CORONARY ANGIOGRAM;  Surgeon: Josue Hector, MD;  Location: [facility] CATH LAB;  Service: Cardiovascular;  Laterality: N/A;    .   Percutaneous coronary stent intervention (pci-s)   Right   04/13/2011          Procedure: PERCUTANEOUS CORONARY STENT INTERVENTION (PCI-S);  Surgeon: Burnell Blanks, MD;  Location: [facility] CATH LAB;  Service: Cardiovascular;  Laterality: Right;    .   Left heart catheterization with coronary angiogram   N/A   06/20/2013          Procedure: LEFT HEART CATHETERIZATION WITH CORONARY ANGIOGRAM;  Surgeon: Leonie Man, MD;  Location: [facility] C[facility] LAB;  Service: Cardiovascular;  Laterality: N/A;        Family History    Problem   Relation   Age of Onset    .   Heart attack   Paternal Grandfather       .   Cancer   Father         SOCIAL HISTORY: Social History       Social History    .   Marital Status:   Unknown          Spouse Name:   N/A    .   Number of Children:   N/A    .   Years of Education:   N/A       Occupational History    .   Retired          Social History Main Topics    .   Smoking status:   Former Smoker -- 1.00 packs/day for 12 years          Types:   Cigarettes    .   Smokeless tobacco:   Never Used             Comment: "quit smoking cigarettes 1969"    .   Alcohol Use:   0.0 oz/week          0 Standard drinks or equivalent per week             Comment: "occasionally I'll have a beer; last time Summer 2012; prior to that was ~2008""    .   Drug Use:   No    .   Sexual Activity:   Not on file       Other Topics   Concern    .   Not on file       Social History Narrative       Married       [place]       Retired from [employer] smoking in [employer]       Sedentary      No Known Allergies Current Outpatient Prescriptions on File Prior to  Visit    Medication   Sig   Dispense   Refill    .   atorvastatin (LIPITOR) 80 MG tablet   Take 1 tablet (80 mg total) by mouth every evening.   30 tablet   11    .   chlorthalidone (HYGROTON) 25 MG tablet   Take 12.5 mg by mouth daily.          .   dabigatran (PRADAXA) 150 MG CAPS capsule   Take 150 mg by mouth 2 (two) times daily.          .   diphenoxylate-atropine (LOMOTIL) 2.5-0.025 MG tablet   Take 1 tablet by mouth daily as needed for diarrhea or loose stools.          .   enalapril (VASOTEC) 20 MG tablet   Take 1 tablet (20 mg total) by mouth 2 (two) times daily.   60 tablet   11    .   isosorbide  mononitrate (IMDUR) 30 MG 24 hr tablet   Take 30 mg by mouth 2 (two) times daily.          .   metoprolol (TOPROL-XL) 200 MG 24 hr tablet   Take 100 mg by mouth daily.          .   niacin (NIASPAN) 750 MG CR tablet   Take 750 mg by mouth at bedtime.            Marland Kitchen   NIFEdipine (ADALAT CC) 90 MG 24 hr tablet   Take 90 mg by mouth every evening.          .   nitroGLYCERIN (NITROSTAT) 0.4 MG SL tablet   Place 1 tablet (0.4 mg total) under the tongue every 5 (five) minutes as needed for chest pain.   25 tablet   12    .   Omega-3 Fatty Acids (FISH OIL) 500 MG CAPS   Take 1,000 mg by mouth every evening.          Marland Kitchen   OVER THE COUNTER MEDICATION   Take 1 capsule by mouth daily. "Super Beta Prostate" supplement             No current facility-administered medications on file prior to visit.      ROS:    General:  No weight loss, Fever, chills  HEENT: No recent headaches, no nasal bleeding, no visual changes, no sore throat  Neurologic: No dizziness, blackouts, seizures. No recent symptoms of stroke or mini- stroke. No recent episodes of slurred speech, or temporary blindness.  Cardiac: No recent episodes of chest pain/pressure, no shortness of breath at rest.  No shortness of breath with exertion.  Denies history of atrial fibrillation or irregular heartbeat  Vascular: No history of rest pain in  feet.  No history of claudication.  No history of non-healing ulcer, No history of DVT    Pulmonary: No home oxygen, no productive cough, no hemoptysis,  No asthma or wheezing  Musculoskeletal:  [ ]  Arthritis, [ ]  Low back pain,  [ ]  Joint pain  Hematologic:No history of hypercoagulable state.  No history of easy bleeding.  No history of anemia  Gastrointestinal: No hematochezia or melena,  No gastroesophageal reflux, no trouble swallowing  Urinary: [ ]  chronic Kidney disease, [ ]  on HD - [ ]  MWF or [ ]  TTHS, [ ]  Burning with urination, [ ]  Frequent urination, [ ]  Difficulty urinating;    Skin: No rashes  Psychological: No history of anxiety,  No history of depression   Physical Examination    Filed Vitals:   12/12/15 1353  BP: 117/72  Pulse: 82  Temp: 97.5 F (36.4 C)  TempSrc: Oral  Resp: 18  Height: 5\' 8"  (1.727 m)  Weight: 196 lb (88.905 kg)  SpO2: 82%    General:  Alert and oriented, no acute distress Skin: No rash, multiple scattered varicosities over course of greater saphenous Extremity Pulses:  2+ radial, brachial, 2+ right femoral 1+ left femoral, 2+ right dorsalis pedis, absent left popliteal and pedal pulses Musculoskeletal: No deformity or edema     Neurologic: Upper and lower extremity motor 5/5 and symmetric  DATA:  Patient had bilateral ABIs performed at [facility] on 10/01/2015. ABI on the right was 0.97 left was 0.74.  His 1 month follow-up ABIs today shows an ABI on the left of 0.67 duplex shows the left stent is occluded.  I reviewed the patient's recent arteriogram  today. This shows a short segment left SFA occlusion which was then stented. The patient has essentially one-vessel runoff via the peroneal. The anterior tibial artery is open proximally but occludes in the distal leg. The peroneal artery fills the dorsalis pedis artery.  ASSESSMENT:  Left lower extremity claudication lifestyle limiting   PLAN:  I discussed with patient today  possible options of conservative management with a walking program and risk factor modification to see if overall he can improve his walking symptoms without other intervention. At this point he has opted for femoral popliteal bypass grafting in order to improve his symptoms. Risks benefits possible complications and procedure details were discussed with the patient today. These include but are not limited to myocardial events graft thrombosis graft her ability was also discussed. Infection and bleeding also discussed.  Patient's operation is scheduled for 01/01/2016 we will stop his anticoagulation perioperatively.  Ruta Hinds, MD [facility] Office: [phone] Pager: [phone]

## 2016-01-01 NOTE — Anesthesia Procedure Notes (Addendum)
Procedure Name: Intubation Date/Time: 01/01/2016 5:12 PM Performed by: Eligha Bridegroom Pre-anesthesia Checklist: Emergency Drugs available, Patient identified, Suction available, Patient being monitored and Timeout performed Patient Re-evaluated:Patient Re-evaluated prior to inductionOxygen Delivery Method: Circle system utilized Preoxygenation: Pre-oxygenation with 100% oxygen Intubation Type: IV induction, Rapid sequence and Cricoid Pressure applied Laryngoscope Size: Mac and 4 Grade View: Grade I Tube type: Oral Tube size: 7.5 mm Number of attempts: 1 Airway Equipment and Method: Stylet Placement Confirmation: ETT inserted through vocal cords under direct vision,  positive ETCO2 and breath sounds checked- equal and bilateral Secured at: 22 cm Tube secured with: Tape Dental Injury: Teeth and Oropharynx as per pre-operative assessment

## 2016-01-01 NOTE — Anesthesia Postprocedure Evaluation (Signed)
Anesthesia Post Note  Patient: Raymond Jacobson  Procedure(s) Performed: Procedure(s) (LRB): LEFT  FEMORAL-POPLITEAL ARTERY  BYPASS GRAFT  (Left) USING NON-REVERSE LEFT GREATER SAPHENOUS VEIN HARVEST (Left) LEFT FEMORAL ARTERY  ENDARTERECTOMY  (Left)  Anesthetic complications: no Comments: Patient returned to OR for vascular revision.    Last Vitals:  Vitals:   01/01/16 1630 01/01/16 1645  BP: 110/64 108/65  Pulse:    Resp:    Temp:      Last Pain:  Vitals:   01/01/16 1630  TempSrc:   PainSc: 0-No pain                 Keanu Frickey,E. Kalsey Lull

## 2016-01-01 NOTE — Anesthesia Preprocedure Evaluation (Signed)
Anesthesia Evaluation  Patient identified by MRN, date of birth, ID band Patient awake    Reviewed: Allergy & Precautions, NPO status , Patient's Chart, lab work & pertinent test results, reviewed documented beta blocker date and time   History of Anesthesia Complications (+) PONV and history of anesthetic complications  Airway Mallampati: II  TM Distance: >3 FB Neck ROM: Full    Dental  (+) Dental Advisory Given, Upper Dentures, Lower Dentures, Edentulous Upper, Edentulous Lower   Pulmonary shortness of breath (recent bronchitis, dry cough), COPD (recent bronchitis, now resolved), former smoker,    breath sounds clear to auscultation       Cardiovascular hypertension, Pt. on medications and Pt. on home beta blockers (-) angina+ CAD, + Past MI, + Cardiac Stents and + Peripheral Vascular Disease  + dysrhythmias Atrial Fibrillation  Rhythm:Irregular Rate:Normal  '15 ECHO: EF 60-65%, mild LVH, mod TR   Neuro/Psych negative neurological ROS     GI/Hepatic Neg liver ROS, GERD  Medicated and Controlled,  Endo/Other  negative endocrine ROS  Renal/GU Renal InsufficiencyRenal disease (creat 1.36)     Musculoskeletal  (+) Arthritis ,   Abdominal   Peds  Hematology Off Pradaxa since 7/28   Anesthesia Other Findings   Reproductive/Obstetrics                             Anesthesia Physical Anesthesia Plan  ASA: III and emergent  Anesthesia Plan: General   Post-op Pain Management:    Induction: Intravenous  Airway Management Planned: Oral ETT  Additional Equipment:   Intra-op Plan:   Post-operative Plan: Extubation in OR  Informed Consent: I have reviewed the patients History and Physical, chart, labs and discussed the procedure including the risks, benefits and alternatives for the proposed anesthesia with the patient or authorized representative who has indicated his/her understanding and  acceptance.     Plan Discussed with: CRNA  Anesthesia Plan Comments:         Anesthesia Quick Evaluation

## 2016-01-01 NOTE — Anesthesia Postprocedure Evaluation (Signed)
Anesthesia Post Note  Patient: Raymond Jacobson  Procedure(s) Performed: Procedure(s) (LRB): Thrombectomy of Left Leg Femoral-Popliteal Bypass Graft (Left) INTRA OPERATIVE ARTERIOGRAM (Left)  Patient location during evaluation: PACU Anesthesia Type: General Level of consciousness: awake and alert Pain management: pain level controlled Vital Signs Assessment: post-procedure vital signs reviewed and stable Respiratory status: spontaneous breathing, nonlabored ventilation and respiratory function stable Cardiovascular status: blood pressure returned to baseline and stable Postop Assessment: no signs of nausea or vomiting Anesthetic complications: no    Last Vitals:  Vitals:   01/01/16 2000 01/01/16 2015  BP: 114/68 111/63  Pulse: 66 61  Resp: 13 11  Temp:      Last Pain:  Vitals:   01/01/16 1915  TempSrc:   PainSc: Asleep                 Treazure Nery,W. EDMOND

## 2016-01-01 NOTE — Interval H&P Note (Signed)
History and Physical Interval Note:  01/01/2016 7:16 AM  Raymond Jacobson  has presented today for surgery, with the diagnosis of Peripheral vascular disease with left lower extremity claudication I70.212  The various methods of treatment have been discussed with the patient and family. After consideration of risks, benefits and other options for treatment, the patient has consented to  Procedure(s): BYPASS GRAFT FEMORAL-POPLITEAL ARTERY (Left) as a surgical intervention .  The patient's history has been reviewed, patient examined, no change in status, stable for surgery.  I have reviewed the patient's chart and labs.  Questions were answered to the patient's satisfaction.     Ruta Hinds

## 2016-01-01 NOTE — Transfer of Care (Signed)
Immediate Anesthesia Transfer of Care Note  Patient: Raymond Jacobson  Procedure(s) Performed: Procedure(s): Thrombectomy of Left Leg Femoral-Popliteal Bypass Graft (Left) INTRA OPERATIVE ARTERIOGRAM (Left)  Patient Location: PACU  Anesthesia Type:General  Level of Consciousness: awake and alert   Airway & Oxygen Therapy: Patient Spontanous Breathing and Patient connected to nasal cannula oxygen  Post-op Assessment: Report given to RN and Post -op Vital signs reviewed and stable  Post vital signs: Reviewed and stable  Last Vitals:  Vitals:   01/01/16 1630 01/01/16 1645  BP: 110/64 108/65  Pulse:    Resp:    Temp:      Last Pain:  Vitals:   01/01/16 1630  TempSrc:   PainSc: 0-No pain         Complications: No apparent anesthesia complications

## 2016-01-01 NOTE — Anesthesia Preprocedure Evaluation (Addendum)
Anesthesia Evaluation  Patient identified by MRN, date of birth, ID band Patient awake    Reviewed: Allergy & Precautions, NPO status , Patient's Chart, lab work & pertinent test results, reviewed documented beta blocker date and time   History of Anesthesia Complications (+) PONV and history of anesthetic complications  Airway Mallampati: II  TM Distance: >3 FB Neck ROM: Full    Dental  (+) Dental Advisory Given, Upper Dentures, Lower Dentures, Edentulous Upper, Edentulous Lower   Pulmonary shortness of breath (recent bronchitis, dry cough), COPD (recent bronchitis, now resolved), former smoker,    breath sounds clear to auscultation       Cardiovascular hypertension, Pt. on medications and Pt. on home beta blockers (-) angina+ CAD, + Past MI, + Cardiac Stents and + Peripheral Vascular Disease  + dysrhythmias Atrial Fibrillation  Rhythm:Irregular Rate:Normal  '15 ECHO: EF 60-65%, mild LVH, mod TR   Neuro/Psych negative neurological ROS     GI/Hepatic Neg liver ROS, GERD  Medicated and Controlled,  Endo/Other  negative endocrine ROS  Renal/GU Renal InsufficiencyRenal disease (creat 1.36)     Musculoskeletal  (+) Arthritis ,   Abdominal   Peds  Hematology Off Pradaxa since 7/28   Anesthesia Other Findings   Reproductive/Obstetrics                            Anesthesia Physical Anesthesia Plan  ASA: III  Anesthesia Plan: General   Post-op Pain Management:    Induction: Intravenous  Airway Management Planned: Oral ETT  Additional Equipment:   Intra-op Plan:   Post-operative Plan: Extubation in OR  Informed Consent: I have reviewed the patients History and Physical, chart, labs and discussed the procedure including the risks, benefits and alternatives for the proposed anesthesia with the patient or authorized representative who has indicated his/her understanding and acceptance.      Plan Discussed with: CRNA and Surgeon  Anesthesia Plan Comments: (Plan routine, monitors, GETA)        Anesthesia Quick Evaluation

## 2016-01-01 NOTE — OR Nursing (Signed)
Pt transported up to 3S at 1530 and when pt arrived both PACU nurse and 3s nurse attempted to doppler pulses in left foot. Unable to doppler pulses and Sam PA called and arrived at bedside. Dr. Oneida Alar arrived at bedside and unable to doppler pulses as well. Pt was transported back down to PACU. Family informed by Dr. Oneida Alar and new consent for procedure obtained by Dr. Oneida Alar. Pt currently denies any pain or numbness to left leg/foot. He c/o pain in throat. Stated that his throat is sore.

## 2016-01-01 NOTE — Progress Notes (Signed)
Pt noted to have lost doppler signals in left foot on arrival to 3S from PACU.  Will return to OR for thrombectomy.  Emergency consent since no family available.  Ruta Hinds, MD [facility] Office: [phone] Pager: [phone]

## 2016-01-01 NOTE — Anesthesia Procedure Notes (Signed)
Procedure Name: Intubation Date/Time: 01/01/2016 8:37 AM Performed by: Salli Quarry Makai Dumond Pre-anesthesia Checklist: Patient identified, Emergency Drugs available, Suction available and Patient being monitored Patient Re-evaluated:Patient Re-evaluated prior to inductionOxygen Delivery Method: Circle System Utilized Preoxygenation: Pre-oxygenation with 100% oxygen Intubation Type: IV induction Ventilation: Mask ventilation without difficulty Laryngoscope Size: Mac and 3 Grade View: Grade I Tube type: Oral Tube size: 7.5 mm Number of attempts: 1 Airway Equipment and Method: Stylet and Oral airway Placement Confirmation: ETT inserted through vocal cords under direct vision,  positive ETCO2 and breath sounds checked- equal and bilateral Secured at: 22 cm Tube secured with: Tape Dental Injury: Teeth and Oropharynx as per pre-operative assessment

## 2016-01-01 NOTE — Op Note (Signed)
Procedure: Thrombectomy of left femoropopliteal bypass, intraoperative arteriogram  Preoperative diagnosis: Thrombosis left femoropopliteal bypass  Postoperative diagnosis: Same   Anesthesia: Gen.  Assistant: Leontine Locket PA-C  Operative findings: Thrombosed left femoropopliteal bypass suspicious for retained venous valve, intraoperative arteriogram showing widely patent distal anastomosis with runoff via the peroneal and anterior tibial arteries  Operative details: After clearing an emergency consent, the patient was taken the operating room. The patient was placed in supine position on the operating room table. After induction of anesthesia and endotracheal intubation, the patient's entire left lower extremity was prepped and draped in usual sterile fashion. A pre-existing longitudinal incision on the medial aspect of the left thigh was reopened carried down through the saphenous tissues down to level of the pre-existing above-knee popliteal bypass graft. The graft had no pulse within it. The above-knee popliteal artery was dissected free circumferentially and vessel loops placed around this. The patient was then given 8000 units of heparin. The distal anastomosis was taken down. There was no obvious technical defect. There was clot within the bypass graft and the above-knee popliteal artery. This was removed under direct vision. A #3 Fogarty catheter was passed down the distal popliteal artery for its full length. 2 clean passes were obtained. There was good backbleeding from this. The popliteal artery was controlled distally with a Henley clamp proximally with a Henley clamp. Next a #3 and #4 Fogarty catheter was used to thrombectomize the vein graft. Some thrombus was retrieved and there was brisk arterial inflow. I then made 2 more passes with the Fogarty catheter the #4 Fogarty catheter to make sure that all residual thrombus had been removed. The inflow was very brisk at this point. I then  redid the distal anastomosis using a running 6-0 Prolene suture. Just prior completion anastomosis everything was forebled backbled and thoroughly flushed. Anastomosis was secured clamps were released and there was good pulsatile flow in the popliteal artery immediately. There was biphasic Doppler flow in the graft and the distal popliteal artery. The patient had a brisk biphasic peroneal Doppler signal and a monophasic anterior tibial/dorsalis pedis Doppler signal. I was unable to obtain a posterior tibial Doppler signal. I reopened the groin incision. An intraoperative arteriogram was then obtained by placing a 21-gauge butterfly needle into the proximal vein graft. This was done with inflow occlusion. The distal anastomosis was patent with two-vessel runoff via the peroneal and anterior tibial arteries. I then performed an additional view so that we obtained the proximal aspect of the bypass graft. There was a transition point where the vein changed caliber that was suspicious for a valve leaflet. However there was good flow through this area. At this point I was satisfied with the appearance of the vein graft and the distal runoff as well as the distal anastomosis. I removed the butterfly needle and repair the hole with a single 6-0 Prolene suture. Hemostasis was obtained in both incisions. Both incisions were then closed in multiple layers of running 3-0 Vicryl suture in the subcutaneous cutaneous layers followed by 4 Vicryl subcuticular stitch and skin followed by Dermabond on the skin. The patient tolerated the procedure well and there were no common locations. The instrument sponge and needle counts were correct at the end of the case. 80 cc of contrast was used.  Ruta Hinds, MD [facility] [place] Office: [phone] Pager: [phone]

## 2016-01-01 NOTE — Transfer of Care (Signed)
Immediate Anesthesia Transfer of Care Note  Patient: Raymond Jacobson  Procedure(s) Performed: Procedure(s): LEFT  FEMORAL-POPLITEAL ARTERY  BYPASS GRAFT  (Left) USING NON-REVERSE LEFT GREATER SAPHENOUS VEIN HARVEST (Left) LEFT FEMORAL ARTERY  ENDARTERECTOMY  (Left)  Patient Location: PACU  Anesthesia Type:General  Level of Consciousness: awake, alert  and patient cooperative  Airway & Oxygen Therapy: Patient Spontanous Breathing and Patient connected to nasal cannula oxygen  Post-op Assessment: Report given to RN and Post -op Vital signs reviewed and stable  Post vital signs: Reviewed and stable  Last Vitals:  Vitals:   01/01/16 0822  BP: 135/70  Pulse: 71  Resp: 20  Temp: 36.5 C    Last Pain:  Vitals:   01/01/16 0822  TempSrc: Oral         Complications: No apparent anesthesia complications

## 2016-01-02 ENCOUNTER — Inpatient Hospital Stay (HOSPITAL_COMMUNITY): Payer: No Typology Code available for payment source

## 2016-01-02 ENCOUNTER — Encounter (HOSPITAL_COMMUNITY): Payer: Self-pay | Admitting: Vascular Surgery

## 2016-01-02 DIAGNOSIS — I739 Peripheral vascular disease, unspecified: Secondary | ICD-10-CM

## 2016-01-02 LAB — BASIC METABOLIC PANEL
Anion gap: 7 (ref 5–15)
BUN: 12 mg/dL (ref 6–20)
CALCIUM: 8.2 mg/dL — AB (ref 8.9–10.3)
CO2: 26 mmol/L (ref 22–32)
CREATININE: 1.24 mg/dL (ref 0.61–1.24)
Chloride: 104 mmol/L (ref 101–111)
GFR calc non Af Amer: 55 mL/min — ABNORMAL LOW (ref >60)
Glucose, Bld: 134 mg/dL — ABNORMAL HIGH (ref 65–99)
Potassium: 4.4 mmol/L (ref 3.5–5.1)
SODIUM: 137 mmol/L (ref 135–145)

## 2016-01-02 LAB — CBC
HCT: 31.6 % — ABNORMAL LOW (ref 39.0–52.0)
Hemoglobin: 10.6 g/dL — ABNORMAL LOW (ref 13.0–17.0)
MCH: 29 pg (ref 26.0–34.0)
MCHC: 33.5 g/dL (ref 30.0–36.0)
MCV: 86.6 fL (ref 78.0–100.0)
PLATELETS: 146 10*3/uL — AB (ref 150–400)
RBC: 3.65 MIL/uL — ABNORMAL LOW (ref 4.22–5.81)
RDW: 14 % (ref 11.5–15.5)
WBC: 7.9 10*3/uL (ref 4.0–10.5)

## 2016-01-02 MED ORDER — DABIGATRAN ETEXILATE MESYLATE 150 MG PO CAPS
150.0000 mg | ORAL_CAPSULE | Freq: Two times a day (BID) | ORAL | Status: DC
  Administered 2016-01-02 – 2016-01-03 (×3): 150 mg via ORAL
  Filled 2016-01-02 (×3): qty 1

## 2016-01-02 NOTE — Progress Notes (Addendum)
VASCULAR LAB PRELIMINARY  ARTERIAL  ABI completed:Right ABI indicates moderate reduction in arterial blood flow.  Left ABI indicates severe reduction in arterial blood flow.  Cannot differentiate if left lower extremity signals are venous or arterial as they are so dampened.     RIGHT    LEFT    PRESSURE WAVEFORM  PRESSURE WAVEFORM  BRACHIAL 112 T BRACHIAL 115 T  DP   DP    AT 105 0.91 AT 32 DM  PT 101 0.88 PT 36 DM  PER   PER 43 DM  GREAT TOE  NA GREAT TOE  NA    RIGHT LEFT  ABI 0.91 0.37   Called report to Dr. Trula Slade via [place].  Nazir Hacker, RVT 01/02/2016, 4:56 PM

## 2016-01-02 NOTE — Evaluation (Signed)
Occupational Therapy Evaluation Patient Details Name: Raymond Jacobson MRN: [id number] DOB: August 26, 1940 Today's Date: 01/02/2016    History of Present Illness Pt is a 75 y.o. male s/p  Left femoral to above-knee popliteal bypass and  Thrombectomy of Left Leg Femoral-Popliteal Bypass Graft on 01/01/2016.   Clinical Impression   This 75 yo male admitted and underwent above presents to acute OT at Mod I level. No further OT needs, we will sign off.    Follow Up Recommendations  No OT follow up    Equipment Recommendations  None recommended by OT       Precautions / Restrictions Precautions Precautions: None Restrictions Weight Bearing Restrictions: No      Mobility Bed Mobility Overal bed mobility: Modified Independent             General bed mobility comments: needed assistance with lines and leads   Transfers Overall transfer level: Independent Equipment used: Rolling walker (2 wheeled)             General transfer comment: used rolling walker but deemed it unnecessary pt could walk safely w/o AD    Balance Overall balance assessment: Modified Independent                                          ADL Overall ADL's : Modified independent                                                       Pertinent Vitals/Pain Pain Assessment: 0-10 Pain Score: 2  Pain Location: LLE Pain Descriptors / Indicators: Sore Pain Intervention(s): Monitored during session;Repositioned     Hand Dominance Right   Extremity/Trunk Assessment Upper Extremity Assessment Upper Extremity Assessment: Overall WFL for tasks assessed   Lower Extremity Assessment Lower Extremity Assessment: Overall WFL for tasks assessed   Cervical / Trunk Assessment Cervical / Trunk Assessment: Normal   Communication Communication Communication: No difficulties   Cognition Arousal/Alertness: Awake/alert Behavior During Therapy: Restless Overall Cognitive  Status: Within Functional Limits for tasks assessed                                Home Living Family/patient expects to be discharged to:: Private residence Living Arrangements: Alone Available Help at Discharge: Friend(s) Type of Home: House Home Access: Stairs to enter Secretary/administrator of Steps: 2 Entrance Stairs-Rails: Can reach both Home Layout: One level (one level)     Bathroom Shower/Tub: Chief Strategy Officer: Standard Bathroom Accessibility: No   Home Equipment: None   Additional Comments: will be staying with friend immediately after d/c. Friend has walk-in shower      Prior Functioning/Environment Level of Independence: Independent             OT Diagnosis: Generalized weakness         OT Goals(Current goals can be found in the care plan section) Acute Rehab OT Goals Patient Stated Goal: To go back home and be independent  OT Frequency:             Co-evaluation PT/OT/SLP Co-Evaluation/Treatment: Yes (partial) Reason for Co-Treatment: For patient/therapist safety   OT goals addressed during session: ADL's  and self-care;Strengthening/ROM      End of Session Equipment Utilized During Treatment: Gait belt Nurse Communication: Mobility status  Activity Tolerance: Patient tolerated treatment well Patient left: in chair;with call bell/phone within reach;with family/visitor present   Time: 0957-1010 OT Time Calculation (min): 13 min Charges:  OT General Charges $OT Visit: 1 Procedure OT Evaluation $OT Eval Moderate Complexity: 1 Procedure  Almon Register [id number] 01/02/2016, 12:40 PM

## 2016-01-02 NOTE — Progress Notes (Signed)
Pt now with triphasic peroneal, biphasic DP, faint monophasic PT doppler, foot warm and pink Continue low dose heparin.   Most likely resume his outpt anticoagulation later today  Ruta Hinds, MD [facility] [place] Office: [phone] Pager: [phone]

## 2016-01-02 NOTE — Care Management Note (Signed)
Case Management Note  Patient Details  Name: Raymond Jacobson MRN: [id number] Date of Birth: January 18, 1941  Subjective/Objective:   Patient is from home with wife, s/p Thrombectomy of left femoropopliteal bypass, NCM will cont to follow for dc needs.  PT pt/ot eval no f/u needed.                  Action/Plan:   Expected Discharge Date:                  Expected Discharge Plan:  Home/Self Care  In-House Referral:     Discharge planning Services  CM Consult  Post Acute Care Choice:    Choice offered to:     DME Arranged:    DME Agency:     HH Arranged:    HH Agency:     Status of Service:  Completed, signed off  If discussed at [employer] of Stay Meetings, dates discussed:    Additional Comments:  Zenon Mayo, RN 01/02/2016, 3:18 PM

## 2016-01-02 NOTE — Evaluation (Signed)
Physical Therapy Evaluation Patient Details Name: Raymond Jacobson MRN: [id number] DOB: [birth year] Today's Date: 01/02/2016   History of Present Illness  Pt is a 75 y.o. male s/p  Left femoral to above-knee popliteal bypass and  Thrombectomy of Left Leg Femoral-Popliteal Bypass Graft on 01/01/2016.  Clinical Impression  Patient seen for mobility assessment s/p sx. At this time patient mobilizing well, ambulated without device and performed stair negotiation. No further acute PT needs at this time. Will sign off.     Follow Up Recommendations No PT follow up    Equipment Recommendations  None recommended by PT    Recommendations for Other Services       Precautions / Restrictions Precautions Precautions: None Restrictions Weight Bearing Restrictions: No      Mobility  Bed Mobility Overal bed mobility: Modified Independent             General bed mobility comments: needed assistance with lines and leads   Transfers Overall transfer level: Independent Equipment used: Rolling walker (2 wheeled)             General transfer comment: used rolling walker but deemed it unnecessary pt could walk safely w/o AD  Ambulation/Gait Ambulation/Gait assistance: Modified independent (Device/Increase time) Ambulation Distance (Feet): 200 Feet Assistive device: None (RW for for approx 26ft) Gait Pattern/deviations: Step-through pattern;Antalgic Gait velocity: decreased Gait velocity interpretation: Below normal speed for age/gender General Gait Details: steady with ambulation, no physical assist required, did not need RW, ambulated without device  Stairs Stairs: Yes Stairs assistance: Supervision Stair Management: One rail Right;Step to pattern Number of Stairs: 4    Wheelchair Mobility    Modified Rankin (Stroke Patients Only)       Balance Overall balance assessment: Modified Independent                                           Pertinent  Vitals/Pain Pain Assessment: 0-10 Pain Score: 2  Pain Location: LLE Pain Descriptors / Indicators: Sore Pain Intervention(s): Monitored during session;Repositioned    Home Living Family/patient expects to be discharged to:: Private residence Living Arrangements: Alone Available Help at Discharge: Friend(s) Type of Home: House Home Access: Stairs to enter Entrance Stairs-Rails: Can reach both Entrance Stairs-Number of Steps: 2 Home Layout: One level (one level) Home Equipment: None Additional Comments: will be staying with friend immediately after d/c. Friend has walk-in shower    Prior Function Level of Independence: Independent               Hand Dominance   Dominant Hand: Right    Extremity/Trunk Assessment   Upper Extremity Assessment: Overall WFL for tasks assessed           Lower Extremity Assessment: Overall WFL for tasks assessed      Cervical / Trunk Assessment: Normal  Communication   Communication: No difficulties  Cognition Arousal/Alertness: Awake/alert Behavior During Therapy: Restless Overall Cognitive Status: Within Functional Limits for tasks assessed                      General Comments General comments (skin integrity, edema, etc.): educated on elevation for edema control    Exercises        Assessment/Plan    PT Assessment Patent does not need any further PT services  PT Diagnosis Acute pain   PT Problem List    PT Treatment  Interventions     PT Goals (Current goals can be found in the Care Plan section) Acute Rehab PT Goals Patient Stated Goal: To go back home and be independent PT Goal Formulation: With patient Time For Goal Achievement: 01/16/16 Potential to Achieve Goals: Good    Frequency     Barriers to discharge        Co-evaluation   Reason for Co-Treatment: For patient/therapist safety   OT goals addressed during session: ADL's and self-care;Strengthening/ROM       End of Session Equipment  Utilized During Treatment: Gait belt Activity Tolerance: No increased pain Patient left: in chair;with call bell/phone within reach;with family/visitor present Nurse Communication: Mobility status         Time: [id number] PT Time Calculation (min) (ACUTE ONLY): 25 min   Charges:   PT Evaluation $PT Eval Low Complexity: 1 Procedure     PT G CodesDuncan Dull 15-Jan-2016, 12:47 PM Alben Deeds, [place] DPT  [phone]

## 2016-01-02 NOTE — Progress Notes (Addendum)
  Progress Note    01/02/2016 7:25 AM 1 Day Post-Op  Subjective:  No complaints; says he doesn't have much pain.  Afebrile HR  50's-70's NSR [id number] systolic [id number]  Vitals:   01/01/16 2327 01/02/16 0408  BP: 118/71 128/76  Pulse: 71 78  Resp: (!) 24 13  Temp: 97.4 F (36.3 C) 97.9 F (36.6 C)    Physical Exam: Cardiac:  Regular Lungs:  Non labored Incisions:  All are clean and dry Extremities:  Palpable left DP pulse.  Left foot is warm.   CBC    Component Value Date/Time   WBC 7.9 01/02/2016 0633   RBC 3.65 (L) 01/02/2016 0633   HGB 10.6 (L) 01/02/2016 0633   HCT 31.6 (L) 01/02/2016 0633   PLT 146 (L) 01/02/2016 0633   MCV 86.6 01/02/2016 0633   MCH 29.0 01/02/2016 0633   MCHC 33.5 01/02/2016 0633   RDW 14.0 01/02/2016 0633   LYMPHSABS 1.2 10/01/2015 0901   MONOABS 0.4 10/01/2015 0901   EOSABS 0.1 10/01/2015 0901   BASOSABS 0.1 10/01/2015 0901    BMET    Component Value Date/Time   NA 137 01/02/2016 0633   K 4.4 01/02/2016 0633   CL 104 01/02/2016 0633   CO2 26 01/02/2016 0633   GLUCOSE 134 (H) 01/02/2016 0633   BUN 12 01/02/2016 0633   CREATININE 1.24 01/02/2016 0633   CALCIUM 8.2 (L) 01/02/2016 0633   GFRNONAA 55 (L) 01/02/2016 0633   GFRAA >60 01/02/2016 0633    INR    Component Value Date/Time   INR 1.00 01/01/2016 0705     Intake/Output Summary (Last 24 hours) at 01/02/16 0725 Last data filed at 01/02/16 0500  Gross per 24 hour  Intake          4191.17 ml  Output             1435 ml  Net          2756.17 ml     Assessment:  75 y.o. male is s/p:   Left femoral endarterectomy, Left femoral to above-knee popliteal bypass and  Thrombectomy of left femoropopliteal bypass, intraoperative arteriogram 1 Day Post-Op  Plan: -pt doing well this morning with palpable left DP pulse. -pain is well controlled -will continue low dose heparin for now-will d/w Dr. Oneida Alar about anticoagulation-pt is on Pradaxa outpatient -acute  surgical blood loss anemia-pt tolerating (no transfusion) -creatinine up slightly, however, it is still better than pre-admission-good UOP -d/c foley/mobilize/out of bed to chair. -discussed groin wound care with pt and wife.   -transfer to [place], [place] [facility] [phone] 01/02/2016 7:25 AM   Agree with above Restart pre op meds including NOAC Transfer 2W Brisk DP biphasic, monophasic PT, triphasic peroneal  Ruta Hinds, MD [facility] [place]: [phone] Pager: [phone]

## 2016-01-02 NOTE — Progress Notes (Signed)
ANTICOAGULATION CONSULT NOTE - Initial Consult  Pharmacy Consult for heparin to pradaxa Indication: atrial fibrillation  No Known Allergies  Patient Measurements: Height: 5\' 8"  (172.7 cm) Weight: 194 lb (88 kg) IBW/kg (Calculated) : 68.4   Vital Signs: Temp: 97.9 F (36.6 C) (08/03 0408) Temp Source: Oral (08/03 0408) BP: 139/73 (08/03 0710) Pulse Rate: 82 (08/03 0710)  Labs:  Recent Labs  01/01/16 0705 01/01/16 2310 01/02/16 0633  HGB  --  11.4* 10.6*  HCT  --  33.6* 31.6*  PLT  --  124* 146*  APTT 28  --   --   LABPROT 13.2  --   --   INR 1.00  --   --   CREATININE  --  1.12 1.24    Estimated Creatinine Clearance: 55.5 mL/min (by C-G formula based on SCr of 1.24 mg/dL).   Medical History: Past Medical History:  Diagnosis Date  . Angina    last time >yr  . Arthritis    "knees, ankles" (10/15/2015)  . Basal cell carcinoma of scalp    "froze off"  . CAD (coronary artery disease)    Previous stents last 6 years ago [place] CT  . Complication of anesthesia   . Cough    recent bronchitis- dry cough  . Dry cough   . DVT (deep venous thrombosis) ([facility]) ~ 1969   LLE  . Elevated lipids   . GERD (gastroesophageal reflux disease)    occ -tums  . High cholesterol   . History of hiatal hernia    hx  . Hypertension   . Myocardial infarction [facility]) ~ 2006   "blood work"  . Peripheral vascular disease ([place])   . PONV (postoperative nausea and vomiting)   . Shortness of breath 04/13/11   "mostly w/exertion"/ now with bronchitis    Medications:  Prescriptions Prior to Admission  Medication Sig Dispense Refill Last Dose  . atorvastatin (LIPITOR) 80 MG tablet Take 1 tablet (80 mg total) by mouth every evening. (Patient taking differently: Take 40 mg by mouth every evening. ) 30 tablet 11 12/31/2015 at Unknown time  . chlorthalidone (HYGROTON) 25 MG tablet Take 12.5 mg by mouth daily.   12/31/2015 at Unknown time  . dabigatran (PRADAXA) 150 MG CAPS capsule Take 150 mg  by mouth 2 (two) times daily.   12/27/2015  . diphenoxylate-atropine (LOMOTIL) 2.5-0.025 MG tablet Take 1 tablet by mouth daily as needed for diarrhea or loose stools.   Taking  . enalapril (VASOTEC) 20 MG tablet Take 1 tablet (20 mg total) by mouth 2 (two) times daily. 60 tablet 11 Taking  . isosorbide mononitrate (IMDUR) 30 MG 24 hr tablet Take 30 mg by mouth 2 (two) times daily.   01/01/2016 at [place]  . metoprolol (TOPROL-XL) 200 MG 24 hr tablet Take 100 mg by mouth daily.   01/01/2016 at 0515  . niacin (NIASPAN) 750 MG CR tablet Take 750 mg by mouth at bedtime.     12/31/2015 at Unknown time  . NIFEdipine (ADALAT CC) 90 MG 24 hr tablet Take 90 mg by mouth every evening.   12/31/2015 at Unknown time  . OVER THE COUNTER MEDICATION Take 1 capsule by mouth daily. "Super Beta Prostate" supplement   Past Week at Unknown time  . nitroGLYCERIN (NITROSTAT) 0.4 MG SL tablet Place 1 tablet (0.4 mg total) under the tongue every 5 (five) minutes as needed for chest pain. 25 tablet 12 More than a month at Unknown time    Assessment:  Hep per MD, s/p emergent thrombectomy.  Heparin running at 500units/hr.  Hg 10.6, pltc 124>126 - was 215 on 12/24/15.  No bleeding reported. Restart pradaxa and stop heparin  Pradaxa 150 bid is home dose - creat cl > 30 ml/hr.    Plan: dc heparin drip at same time as first dose of pradaxa 150 mg po bid Pharmacy so sign off Thanks Eudelia Bunch, Pharm.D. [id number] 01/02/2016 8:45 AM

## 2016-01-03 ENCOUNTER — Inpatient Hospital Stay (HOSPITAL_COMMUNITY): Payer: No Typology Code available for payment source

## 2016-01-03 DIAGNOSIS — M79605 Pain in left leg: Secondary | ICD-10-CM

## 2016-01-03 LAB — HEPARIN INDUCED PLATELET AB (HIT ANTIBODY): Heparin Induced Plt Ab: 0.185 OD (ref 0.000–0.400)

## 2016-01-03 MED ORDER — HEPARIN (PORCINE) IN NACL 100-0.45 UNIT/ML-% IJ SOLN
1450.0000 [IU]/h | INTRAMUSCULAR | Status: DC
  Administered 2016-01-04 (×2): 1350 [IU]/h via INTRAVENOUS
  Administered 2016-01-05: 1450 [IU]/h via INTRAVENOUS
  Filled 2016-01-03 (×4): qty 250

## 2016-01-03 MED ORDER — HEPARIN BOLUS VIA INFUSION
4000.0000 [IU] | Freq: Once | INTRAVENOUS | Status: AC
  Administered 2016-01-04: 4000 [IU] via INTRAVENOUS
  Filled 2016-01-03: qty 4000

## 2016-01-03 NOTE — Progress Notes (Addendum)
Progress Note    01/03/2016 7:47 AM 2 Days Post-Op  Subjective:  Pt states he has a lot of gas and can't go to the bathroom.  Sore in incisions.  Wife thinks he pushed too hard yesterday with PT.  Tm 99.6 now 98.9 HR 60's-90's NSR 0000000 systolic [id number]  Vitals:   01/02/16 2016 01/03/16 0433  BP: (!) 117/55 (!) 108/50  Pulse: 84 97  Resp: 18   Temp: 99.6 F (37.6 C) 98.9 F (37.2 C)    Physical Exam: Cardiac:  regular Lungs:  Non labored Incisions:  Incisions are clean and dry Extremities:  +doppler signal left DP/peroneal   CBC    Component Value Date/Time   WBC 7.9 01/02/2016 0633   RBC 3.65 (L) 01/02/2016 0633   HGB 10.6 (L) 01/02/2016 0633   HCT 31.6 (L) 01/02/2016 0633   PLT 146 (L) 01/02/2016 0633   MCV 86.6 01/02/2016 0633   MCH 29.0 01/02/2016 0633   MCHC 33.5 01/02/2016 0633   RDW 14.0 01/02/2016 0633   LYMPHSABS 1.2 10/01/2015 0901   MONOABS 0.4 10/01/2015 0901   EOSABS 0.1 10/01/2015 0901   BASOSABS 0.1 10/01/2015 0901    BMET    Component Value Date/Time   NA 137 01/02/2016 0633   K 4.4 01/02/2016 0633   CL 104 01/02/2016 0633   CO2 26 01/02/2016 0633   GLUCOSE 134 (H) 01/02/2016 0633   BUN 12 01/02/2016 0633   CREATININE 1.24 01/02/2016 0633   CALCIUM 8.2 (L) 01/02/2016 0633   GFRNONAA 55 (L) 01/02/2016 0633   GFRAA >60 01/02/2016 0633    INR    Component Value Date/Time   INR 1.00 01/01/2016 0705     Intake/Output Summary (Last 24 hours) at 01/03/16 0747 Last data filed at 01/02/16 2338  Gross per 24 hour  Intake              480 ml  Output              900 ml  Net             -420 ml    ABI's 01/02/16:  RIGHT   LEFT    PRESSURE WAVEFORM  PRESSURE WAVEFORM  BRACHIAL 112 T BRACHIAL 115 T  DP   DP    AT 105 0.91 AT 32 DM  PT 101 0.88 PT 36 DM  PER   PER 43 DM  GREAT TOE  NA GREAT TOE  NA    RIGHT LEFT  ABI 0.91 0.37    Assessment:  75 y.o. male is s/p:  Left femoral endarterectomy,  Leftfemoral to above-knee popliteal bypass and  Thrombectomy of left femoropopliteal bypass, intraoperative arteriogram  2 Days Post-Op  Plan: -pt has + doppler signals this morning left DP/peroneal, which does not reflect ABI's as their note says they could not differentiate if LLE signals are venous or arterial as they were dampened.  Signals are clearly arterial this morning.  -dulcolax for bowels -OOB to chair tid with meals and mobilize. -there is no gauze in right groin.  Need to keep dry gauze in right groin to wick moisture to help prevent wound infection. -DVT prophylaxis:  Pradaxa   Leontine Locket, PA-C [facility] [phone] 01/03/2016 7:47 AM   Exam as above.  Incisions clean.  ABI and duplex reviewed.  Graft has again occluded but foot not threatened.  Will plan to redo bypass on 01/06/16 with PTFE as most likely there is  a bad spot in this vein that has caused reocclusion.  Will do heparin over weekend left fem pop with propaten Monday. Ok to get out of bed and ambulate. Will d/c pradaxa for now since starting heparin  Ruta Hinds, MD [facility] [place] Office: [phone] Pager: [phone]

## 2016-01-03 NOTE — Progress Notes (Signed)
*  PRELIMINARY RESULTS* Vascular Ultrasound Limited Left Lower Extremity Arterial Duplex has been completed.   Unable to adequately identify the left femoral popliteal bypass graft, suggestive of possible occlusion. The left common femoral artery exhibits no discernable flow. The left popltieal artery is patent with severely dampened monophasic flow.  01/03/2016 12:06 PM Maudry Mayhew, B.S., RVT, RDCS, RDMS

## 2016-01-03 NOTE — Care Management Note (Addendum)
Case Management Note  Patient Details  Name: [name] MRN: [id number] Date of Birth: 10/17/1940  Subjective/Objective:    s/p Thrombectomy of left femoropopliteal bypass                Action/Plan: Discharge Planning:   NCM spoke to pt and states he does follow up at [facility], [place] contacted [facility], # [phone] ext [phone], fax [phone] left message with Anderson Malta, CSW of pt's admission. Will fax dc summary at time of dc. Waiting final recommendations for home.    01/04/2016 Pt preoperatively set up with [facility]. Waiting PT recommendations.   Expected Discharge Date:                  Expected Discharge Plan:  [place]  In-House Referral:  NA  Discharge planning Services  CM Consult  Post Acute Care Choice:    Choice offered to:     DME Arranged:    DME Agency:     HH Arranged:    HH Agency:     Status of Service:  In process, will continue to follow  If discussed at Long Length of Stay Meetings, dates discussed:    Additional Comments:  Erenest Rasher, RN 01/03/2016, 4:50 PM

## 2016-01-03 NOTE — Progress Notes (Signed)
ANTICOAGULATION CONSULT NOTE - Initial Consult  Pharmacy Consult for Heparin Indication: atrial fibrillation + occluded L leg bypass   No Known Allergies  Patient Measurements: Height: 5\' 8"  (172.7 cm) Weight: 194 lb (88 kg) IBW/kg (Calculated) : 68.4 Heparin Dosing Weight: 86.5 kg  Vital Signs: Temp: 99.6 F (37.6 C) (08/04 1330) Temp Source: Oral (08/04 1330) BP: 104/59 (08/04 1330) Pulse Rate: 92 (08/04 1330)  Labs:  Recent Labs  01/01/16 0705 01/01/16 2310 01/02/16 0633  HGB  --  11.4* 10.6*  HCT  --  33.6* 31.6*  PLT  --  124* 146*  APTT 28  --   --   LABPROT 13.2  --   --   INR 1.00  --   --   CREATININE  --  1.12 1.24    Estimated Creatinine Clearance: 55.5 mL/min (by C-G formula based on SCr of 1.24 mg/dL).   Medical History: Past Medical History:  Diagnosis Date  . Angina    last time >yr  . Arthritis    "knees, ankles" (10/15/2015)  . Basal cell carcinoma of scalp    "froze off"  . CAD (coronary artery disease)    Previous stents last 6 years ago [place] CT  . Complication of anesthesia   . Cough    recent bronchitis- dry cough  . Dry cough   . DVT (deep venous thrombosis) ([facility]) ~ 1969   LLE  . Elevated lipids   . GERD (gastroesophageal reflux disease)    occ -tums  . High cholesterol   . History of hiatal hernia    hx  . Hypertension   . Myocardial infarction [facility]) ~ 2006   "blood work"  . Peripheral vascular disease ([place])   . PONV (postoperative nausea and vomiting)   . Shortness of breath 04/13/11   "mostly w/exertion"/ now with bronchitis    Assessment: 75yo male s/p L-femoral endarterectomy, L- femoral to above-knee popliteal bypass/thrombectomy today, then loss of doppler signal & returned to OR for emergent thrombectomy.   Anticoagulation-  Hep per MD, s/p emergent thrombectomy 500 uts/hr >Pradaxa 8/3>Heparin per Rx 8/4 for afib + occluded L leg bypass. Graft has again occluded but foot not threatened. Will plan to redo bypass  on 01/06/16 with PTFE.   Goal of Therapy:  Heparin level 0.3-0.7 units/ml Monitor platelets by anticoagulation protocol: Yes   Plan:   -D/c Pradaxa, but just had a dose at 1250 -Tonight at 0050 on 8/5: start IV heparin with 4000 unit bolus and infusion at 1350 units/hr - Check heparin level and CBC 9 hrs after heparin starts. - Check heparin level and CBC daily.  Syla Devoss S. Alford Highland, PharmD, BCPS Clinical Staff Pharmacist Pager [phone]  Eilene Ghazi Stillinger 01/03/2016,1:46 PM

## 2016-01-04 LAB — CBC
HCT: 31.5 % — ABNORMAL LOW (ref 39.0–52.0)
Hemoglobin: 10.7 g/dL — ABNORMAL LOW (ref 13.0–17.0)
MCH: 29.6 pg (ref 26.0–34.0)
MCHC: 34 g/dL (ref 30.0–36.0)
MCV: 87 fL (ref 78.0–100.0)
PLATELETS: 179 10*3/uL (ref 150–400)
RBC: 3.62 MIL/uL — AB (ref 4.22–5.81)
RDW: 14 % (ref 11.5–15.5)
WBC: 8.6 10*3/uL (ref 4.0–10.5)

## 2016-01-04 LAB — HEPARIN LEVEL (UNFRACTIONATED): HEPARIN UNFRACTIONATED: 0.37 [IU]/mL (ref 0.30–0.70)

## 2016-01-04 NOTE — Progress Notes (Signed)
   VASCULAR SURGERY ASSESSMENT & PLAN:  3 Days Post-Op s/p: Left fem AK pop with vein --> occluded  On heparin  For Redo Left Fem AK pop with PTFE Monday  SUBJECTIVE: No complaints.   PHYSICAL EXAM: Vitals:   01/03/16 1330 01/03/16 1730 01/03/16 2030 01/04/16 0251  BP: (!) 104/59  (!) 115/54 (!) 111/54  Pulse: 92  95 92  Resp: 18  18 18   Temp: 99.6 F (37.6 C)  99.6 F (37.6 C) 98.1 F (36.7 C)  TempSrc: Oral  Oral Oral  SpO2: 91% 92% 90% 90%  Weight:      Height:       Incisions look fine.  Left foot appears adequately perfused.   LABS: Lab Results  Component Value Date   WBC 7.9 01/02/2016   HGB 10.6 (L) 01/02/2016   HCT 31.6 (L) 01/02/2016   MCV 86.6 01/02/2016   PLT 146 (L) 01/02/2016   Lab Results  Component Value Date   CREATININE 1.24 01/02/2016   Lab Results  Component Value Date   INR 1.00 01/01/2016   CBG (last 3)   Recent Labs  01/01/16 1914  GLUCAP 179*    Active Problems:   PAD (peripheral artery disease) ([place])   Gae Gallop [id number] 01/04/2016

## 2016-01-04 NOTE — Progress Notes (Signed)
ANTICOAGULATION CONSULT NOTE - Consult  Pharmacy Consult for Heparin Indication: atrial fibrillation + occluded L leg bypass   No Known Allergies  Patient Measurements: Height: 5\' 8"  (172.7 cm) Weight: 194 lb (88 kg) IBW/kg (Calculated) : 68.4 Heparin Dosing Weight: 86.5 kg  Vital Signs: Temp: 98.1 F (36.7 C) (08/05 0251) Temp Source: Oral (08/05 0251) BP: 111/54 (08/05 0251) Pulse Rate: 92 (08/05 0251)  Labs:  Recent Labs  01/01/16 2310 01/02/16 0633 01/04/16 1000  HGB 11.4* 10.6* 10.7*  HCT 33.6* 31.6* 31.5*  PLT 124* 146* 179  HEPARINUNFRC  --   --  0.37  CREATININE 1.12 1.24  --     Estimated Creatinine Clearance: 55.5 mL/min (by C-G formula based on SCr of 1.24 mg/dL).   Medical History: Past Medical History:  Diagnosis Date  . Angina    last time >yr  . Arthritis    "knees, ankles" (10/15/2015)  . Basal cell carcinoma of scalp    "froze off"  . CAD (coronary artery disease)    Previous stents last 6 years ago [place] CT  . Complication of anesthesia   . Cough    recent bronchitis- dry cough  . Dry cough   . DVT (deep venous thrombosis) (H[facility]) ~ 1969   LLE  . Elevated lipids   . GERD (gastroesophageal reflux disease)    occ -tums  . High cholesterol   . History of hiatal hernia    hx  . Hypertension   . Myocardial infarction [facility]) ~ 2006   "blood work"  . Peripheral vascular disease ([place])   . PONV (postoperative nausea and vomiting)   . Shortness of breath 04/13/11   "mostly w/exertion"/ now with bronchitis    Assessment: 75yo male s/p L-femoral endarterectomy, L- femoral to above-knee popliteal bypass/thrombectomy today, then loss of doppler signal & returned to OR for emergent thrombectomy.   Anticoagulation-  Hep per MD, s/p emergent thrombectomy 500 uts/hr >Pradaxa 8/3>Heparin per Rx 8/4 for afib + occluded L leg bypass. Graft has again occluded but foot not threatened.  Will plan to redo bypass on 01/06/16 with PTFE. HL 0.37 in goal. CBC  stable.    Goal of Therapy:  Heparin level 0.3-0.7 units/ml Monitor platelets by anticoagulation protocol: Yes   Plan:  Contine IV heparin 1350 units/hr Daily HL and CBC  Mattia Osterman S. Alford Highland, PharmD, BCPS Clinical Staff Pharmacist Pager [phone]  Eilene Ghazi Stillinger 01/04/2016,11:57 AM

## 2016-01-05 LAB — CBC
HCT: 28.7 % — ABNORMAL LOW (ref 39.0–52.0)
Hemoglobin: 9.6 g/dL — ABNORMAL LOW (ref 13.0–17.0)
MCH: 29.1 pg (ref 26.0–34.0)
MCHC: 33.4 g/dL (ref 30.0–36.0)
MCV: 87 fL (ref 78.0–100.0)
PLATELETS: 167 10*3/uL (ref 150–400)
RBC: 3.3 MIL/uL — ABNORMAL LOW (ref 4.22–5.81)
RDW: 14.2 % (ref 11.5–15.5)
WBC: 7 10*3/uL (ref 4.0–10.5)

## 2016-01-05 LAB — HEPARIN LEVEL (UNFRACTIONATED)
HEPARIN UNFRACTIONATED: 0.25 [IU]/mL — AB (ref 0.30–0.70)
Heparin Unfractionated: 0.4 IU/mL (ref 0.30–0.70)

## 2016-01-05 MED ORDER — DEXTROSE 5 % IV SOLN
1.5000 g | INTRAVENOUS | Status: AC
  Administered 2016-01-06: 1.5 g via INTRAVENOUS
  Filled 2016-01-05 (×2): qty 1.5

## 2016-01-05 NOTE — Progress Notes (Addendum)
  Progress Note 10:52 AM 4 Days Post-Op  Subjective:  Wife states his BP has been low; pt states he is having a pain/numbness in his heels  Afebrile HR  70's-80's NSR [id number] systolic [id number]  Vitals:   01/04/16 2009 01/05/16 0503  BP: 110/60 (!) 95/48  Pulse:  82  Resp:  20  Temp:  98 F (36.7 C)    Physical Exam: Cardiac:  regular Lungs:  Non labored Incisions:  All are clean and dry.  I am unable to express any drainage from the left groin and left proximal vein graft harvest site.  Extremities:  Left foot is warm and adequately perfused.    CBC    Component Value Date/Time   WBC 7.0 01/05/2016 0218   RBC 3.30 (L) 01/05/2016 0218   HGB 9.6 (L) 01/05/2016 0218   HCT 28.7 (L) 01/05/2016 0218   PLT 167 01/05/2016 0218   MCV 87.0 01/05/2016 0218   MCH 29.1 01/05/2016 0218   MCHC 33.4 01/05/2016 0218   RDW 14.2 01/05/2016 0218   LYMPHSABS 1.2 10/01/2015 0901   MONOABS 0.4 10/01/2015 0901   EOSABS 0.1 10/01/2015 0901   BASOSABS 0.1 10/01/2015 0901    BMET    Component Value Date/Time   NA 137 01/02/2016 0633   K 4.4 01/02/2016 0633   CL 104 01/02/2016 0633   CO2 26 01/02/2016 0633   GLUCOSE 134 (H) 01/02/2016 0633   BUN 12 01/02/2016 0633   CREATININE 1.24 01/02/2016 0633   CALCIUM 8.2 (L) 01/02/2016 0633   GFRNONAA 55 (L) 01/02/2016 0633   GFRAA >60 01/02/2016 0633    INR    Component Value Date/Time   INR 1.00 01/01/2016 0705     Intake/Output Summary (Last 24 hours) at 01/05/16 1052 Last data filed at 01/05/16 0508  Gross per 24 hour  Intake                0 ml  Output              125 ml  Net             -125 ml   Assessment:  75 y.o. male is s/p:  Left femoral endarterectomy, Leftfemoral to above-knee popliteal bypass and  Thrombectomy of left femoropopliteal bypass, intraoperative arteriogram  4 Days Post-Op  Plan: -pt for redo left femoral to popliteal bypass grafting tomorrow.  Npo after MN.  (pt has been pre-op'd) -pt was  up to chair yesterday, but has not ambulated in a day or two bc of drainage from the proximal vein harvest site and left groin.  I have tried to express fluid from both of these incisions and cannot get anything.  Even if he is having drainage, he needs to be walking if steady.  He has been walking to the bathroom.  I have discussed with family and RN to walk with pt's in the hall.  Also, have asked we get an incentive spirometer to the room. -DVT prophylaxis:  Heparin gtt, which will be discontinued on call to the OR. -float heels off the bed when in bed. -BP soft-continue to hold BP meds  Leontine Locket, PA-C [facility] [phone] 01/05/2016 10:52 AM  I have interviewed the patient and examined the patient. I agree with the findings by the PA. Left foot stable.  On heparin (stop on call to OR)  Gae Gallop, MD [phone]

## 2016-01-05 NOTE — Progress Notes (Signed)
[place] for Heparin Indication: atrial fibrillation + occluded L leg bypass   Allergies  Allergen Reactions  . No Known Allergies     Patient Measurements: Height: 5\' 8"  (172.7 cm) Weight: 194 lb (88 kg) IBW/kg (Calculated) : 68.4 Heparin Dosing Weight: 86.5 kg  Vital Signs: Temp: 98.6 F (37 C) (08/05 2004) Temp Source: Oral (08/05 2004) BP: 110/60 (08/05 2009) Pulse Rate: 81 (08/05 2004)  Labs:  Recent Labs  01/02/16 0633 01/04/16 1000 01/05/16 0218  HGB 10.6* 10.7* 9.6*  HCT 31.6* 31.5* 28.7*  PLT 146* 179 167  HEPARINUNFRC  --  0.37 0.25*  CREATININE 1.24  --   --     Estimated Creatinine Clearance: 55.5 mL/min (by C-G formula based on SCr of 1.24 mg/dL).   Assessment: 75 yo male with h/o AFib, Pradaxa on hold for redo fem pop bypass tomorrow, for heparin  Goal of Therapy:  Heparin level 0.3-0.7 units/ml Monitor platelets by anticoagulation protocol: Yes   Plan:  Increase Heparin 1450 units/hr  Jodell Weitman, Bronson Curb 01/05/2016,3:16 AM

## 2016-01-05 NOTE — Progress Notes (Signed)
[place] for Heparin Indication: atrial fibrillation + occluded L leg bypass   Allergies  Allergen Reactions  . No Known Allergies     Patient Measurements: Height: 5\' 8"  (172.7 cm) Weight: 194 lb (88 kg) IBW/kg (Calculated) : 68.4 Heparin Dosing Weight: 86.5 kg  Vital Signs: Temp: 98 F (36.7 C) (08/06 0503) Temp Source: Oral (08/06 0503) BP: 95/48 (08/06 0503) Pulse Rate: 82 (08/06 0503)  Labs:  Recent Labs  01/04/16 1000 01/05/16 0218 01/05/16 1149  HGB 10.7* 9.6*  --   HCT 31.5* 28.7*  --   PLT 179 167  --   HEPARINUNFRC 0.37 0.25* 0.40    Estimated Creatinine Clearance: 55.5 mL/min (by C-G formula based on SCr of 1.24 mg/dL).   Assessment: 75 yo male with h/o AFib, Pradaxa on hold for redo fem pop bypass tomorrow, rx consult for heparin. HL subtherapeutic overnight 8/6, gtt increased, confirmatory HL therapeutic.  Goal of Therapy:  Heparin level 0.3-0.7 units/ml Monitor platelets by anticoagulation protocol: Yes   Plan:  -Continue Heparin 1450 units/hr -Daily HL, CBC, S/Sx bleeding  Arrie Senate, PharmD PGY-1 Pharmacy Resident Pager: [phone] 01/05/2016

## 2016-01-06 ENCOUNTER — Inpatient Hospital Stay (HOSPITAL_COMMUNITY): Payer: No Typology Code available for payment source | Admitting: Certified Registered Nurse Anesthetist

## 2016-01-06 ENCOUNTER — Encounter (HOSPITAL_COMMUNITY)
Admission: RE | Disposition: A | Discharge status: Rehabilitation Facility | Payer: Self-pay | Source: Ambulatory Visit | Attending: Vascular Surgery

## 2016-01-06 ENCOUNTER — Inpatient Hospital Stay (HOSPITAL_COMMUNITY): Payer: No Typology Code available for payment source

## 2016-01-06 ENCOUNTER — Encounter (HOSPITAL_COMMUNITY): Payer: Self-pay | Admitting: Anesthesiology

## 2016-01-06 DIAGNOSIS — T82868A Thrombosis of vascular prosthetic devices, implants and grafts, initial encounter: Secondary | ICD-10-CM

## 2016-01-06 DIAGNOSIS — I70212 Atherosclerosis of native arteries of extremities with intermittent claudication, left leg: Secondary | ICD-10-CM

## 2016-01-06 HISTORY — PX: FEMORAL-POPLITEAL BYPASS GRAFT: SHX937

## 2016-01-06 LAB — HEPARIN LEVEL (UNFRACTIONATED): Heparin Unfractionated: 0.42 IU/mL (ref 0.30–0.70)

## 2016-01-06 LAB — BASIC METABOLIC PANEL
Anion gap: 9 (ref 5–15)
BUN: 21 mg/dL — AB (ref 6–20)
CALCIUM: 8.2 mg/dL — AB (ref 8.9–10.3)
CO2: 27 mmol/L (ref 22–32)
Chloride: 98 mmol/L — ABNORMAL LOW (ref 101–111)
Creatinine, Ser: 1.5 mg/dL — ABNORMAL HIGH (ref 0.61–1.24)
GFR calc Af Amer: 51 mL/min — ABNORMAL LOW (ref >60)
GFR, EST NON AFRICAN AMERICAN: 44 mL/min — AB (ref >60)
GLUCOSE: 141 mg/dL — AB (ref 65–99)
Potassium: 2.6 mmol/L — CL (ref 3.5–5.1)
Sodium: 134 mmol/L — ABNORMAL LOW (ref 135–145)

## 2016-01-06 LAB — CBC
HEMATOCRIT: 28 % — AB (ref 39.0–52.0)
Hemoglobin: 9.5 g/dL — ABNORMAL LOW (ref 13.0–17.0)
MCH: 29.3 pg (ref 26.0–34.0)
MCHC: 33.9 g/dL (ref 30.0–36.0)
MCV: 86.4 fL (ref 78.0–100.0)
PLATELETS: 201 10*3/uL (ref 150–400)
RBC: 3.24 MIL/uL — ABNORMAL LOW (ref 4.22–5.81)
RDW: 14 % (ref 11.5–15.5)
WBC: 6.7 10*3/uL (ref 4.0–10.5)

## 2016-01-06 LAB — POCT I-STAT 4, (NA,K, GLUC, HGB,HCT)
GLUCOSE: 154 mg/dL — AB (ref 65–99)
HEMATOCRIT: 29 % — AB (ref 39.0–52.0)
Hemoglobin: 9.9 g/dL — ABNORMAL LOW (ref 13.0–17.0)
Potassium: 2.8 mmol/L — ABNORMAL LOW (ref 3.5–5.1)
Sodium: 138 mmol/L (ref 135–145)

## 2016-01-06 SURGERY — BYPASS GRAFT FEMORAL-POPLITEAL ARTERY
Anesthesia: General | Site: Leg Upper | Laterality: Left

## 2016-01-06 SURGERY — BYPASS GRAFT FEMORAL-POPLITEAL ARTERY
Anesthesia: General | Site: Leg Lower | Laterality: Left

## 2016-01-06 MED ORDER — LACTATED RINGERS IV SOLN
INTRAVENOUS | Status: DC
  Administered 2016-01-06: 11:00:00 via INTRAVENOUS

## 2016-01-06 MED ORDER — PHENYLEPHRINE HCL 10 MG/ML IJ SOLN
INTRAVENOUS | Status: DC | PRN
  Administered 2016-01-06: 30 ug/min via INTRAVENOUS

## 2016-01-06 MED ORDER — IOPAMIDOL (ISOVUE-300) INJECTION 61%
INTRAVENOUS | Status: DC | PRN
  Administered 2016-01-06: 10 mL via INTRAVENOUS

## 2016-01-06 MED ORDER — FENTANYL CITRATE (PF) 250 MCG/5ML IJ SOLN
INTRAMUSCULAR | Status: AC
  Filled 2016-01-06: qty 5

## 2016-01-06 MED ORDER — OXYCODONE HCL 5 MG/5ML PO SOLN: 5.0000 mg | Freq: Once | ORAL | Status: DC | PRN

## 2016-01-06 MED ORDER — PHENYLEPHRINE HCL 10 MG/ML IJ SOLN
INTRAMUSCULAR | Status: DC | PRN
  Administered 2016-01-06: 80 ug via INTRAVENOUS
  Administered 2016-01-06: 120 ug via INTRAVENOUS

## 2016-01-06 MED ORDER — ALBUMIN HUMAN 5 % IV SOLN
INTRAVENOUS | Status: DC | PRN
  Administered 2016-01-06: 16:00:00 via INTRAVENOUS

## 2016-01-06 MED ORDER — HYDROMORPHONE HCL 1 MG/ML IJ SOLN: 0.2500 mg | INTRAMUSCULAR | Status: DC | PRN

## 2016-01-06 MED ORDER — ONDANSETRON HCL 4 MG/2ML IJ SOLN
INTRAMUSCULAR | Status: DC | PRN
  Administered 2016-01-06: 4 mg via INTRAVENOUS

## 2016-01-06 MED ORDER — POTASSIUM CHLORIDE CRYS ER 20 MEQ PO TBCR: 20.0000 meq | EXTENDED_RELEASE_TABLET | Freq: Once | ORAL | Status: DC

## 2016-01-06 MED ORDER — SODIUM CHLORIDE 0.9 % IV SOLN
INTRAVENOUS | Status: DC | PRN
  Administered 2016-01-06 (×2): via INTRAVENOUS

## 2016-01-06 MED ORDER — POTASSIUM CHLORIDE 10 MEQ/100ML IV SOLN
10.0000 meq | INTRAVENOUS | Status: AC
  Administered 2016-01-06 (×3): 10 meq via INTRAVENOUS
  Filled 2016-01-06 (×2): qty 100

## 2016-01-06 MED ORDER — SUCCINYLCHOLINE CHLORIDE 200 MG/10ML IV SOSY
PREFILLED_SYRINGE | INTRAVENOUS | Status: AC
  Filled 2016-01-06: qty 10

## 2016-01-06 MED ORDER — MIDAZOLAM HCL 2 MG/2ML IJ SOLN
INTRAMUSCULAR | Status: AC
  Filled 2016-01-06: qty 2

## 2016-01-06 MED ORDER — FENTANYL CITRATE (PF) 250 MCG/5ML IJ SOLN
INTRAMUSCULAR | Status: AC
  Filled 2016-01-06: qty 10

## 2016-01-06 MED ORDER — DEXTROSE 5 % IV SOLN
INTRAVENOUS | Status: DC | PRN
  Administered 2016-01-06: 1.5 g via INTRAVENOUS

## 2016-01-06 MED ORDER — LIDOCAINE HCL (CARDIAC) 20 MG/ML IV SOLN
INTRAVENOUS | Status: DC | PRN
  Administered 2016-01-06: 100 mg via INTRAVENOUS

## 2016-01-06 MED ORDER — SODIUM CHLORIDE 0.9 % IV SOLN: INTRAVENOUS | Status: DC | PRN

## 2016-01-06 MED ORDER — FENTANYL CITRATE (PF) 100 MCG/2ML IJ SOLN
25.0000 ug | INTRAMUSCULAR | Status: DC | PRN
  Administered 2016-01-06: 50 ug via INTRAVENOUS

## 2016-01-06 MED ORDER — POTASSIUM CHLORIDE 20 MEQ PO PACK
20.0000 meq | PACK | Freq: Once | ORAL | Status: DC
  Filled 2016-01-06: qty 1

## 2016-01-06 MED ORDER — MIDAZOLAM HCL 5 MG/5ML IJ SOLN
INTRAMUSCULAR | Status: DC | PRN
  Administered 2016-01-06: 2 mg via INTRAVENOUS

## 2016-01-06 MED ORDER — DEXTROSE 5 % IV SOLN
INTRAVENOUS | Status: AC
  Filled 2016-01-06: qty 1.5

## 2016-01-06 MED ORDER — FENTANYL CITRATE (PF) 100 MCG/2ML IJ SOLN
INTRAMUSCULAR | Status: DC | PRN
  Administered 2016-01-06 (×5): 50 ug via INTRAVENOUS

## 2016-01-06 MED ORDER — PROPOFOL 10 MG/ML IV BOLUS
INTRAVENOUS | Status: AC
  Filled 2016-01-06: qty 20

## 2016-01-06 MED ORDER — PROPOFOL 10 MG/ML IV BOLUS
INTRAVENOUS | Status: DC | PRN
  Administered 2016-01-06: 200 mg via INTRAVENOUS

## 2016-01-06 MED ORDER — 0.9 % SODIUM CHLORIDE (POUR BTL) OPTIME
TOPICAL | Status: DC | PRN
  Administered 2016-01-06: 2000 mL

## 2016-01-06 MED ORDER — OXYCODONE HCL 5 MG PO TABS: 5.0000 mg | ORAL_TABLET | Freq: Once | ORAL | Status: DC | PRN

## 2016-01-06 MED ORDER — ROCURONIUM BROMIDE 100 MG/10ML IV SOLN
INTRAVENOUS | Status: DC | PRN
  Administered 2016-01-06: 20 mg via INTRAVENOUS
  Administered 2016-01-06: 50 mg via INTRAVENOUS
  Administered 2016-01-06: 20 mg via INTRAVENOUS

## 2016-01-06 MED ORDER — HEPARIN SODIUM (PORCINE) 1000 UNIT/ML IJ SOLN
INTRAMUSCULAR | Status: DC | PRN
  Administered 2016-01-06: 8000 [IU] via INTRAVENOUS

## 2016-01-06 MED ORDER — MEPERIDINE HCL 25 MG/ML IJ SOLN: 6.2500 mg | INTRAMUSCULAR | Status: DC | PRN

## 2016-01-06 MED ORDER — SUCCINYLCHOLINE CHLORIDE 20 MG/ML IJ SOLN
INTRAMUSCULAR | Status: DC | PRN
  Administered 2016-01-06: 140 mg via INTRAVENOUS

## 2016-01-06 MED ORDER — SODIUM CHLORIDE 0.45 % IV SOLN
INTRAVENOUS | Status: DC
  Administered 2016-01-06: 20:00:00 via INTRAVENOUS

## 2016-01-06 MED ORDER — DEXAMETHASONE SODIUM PHOSPHATE 10 MG/ML IJ SOLN
INTRAMUSCULAR | Status: DC | PRN
  Administered 2016-01-06: 10 mg via INTRAVENOUS

## 2016-01-06 MED ORDER — SODIUM CHLORIDE 0.9 % IV SOLN
INTRAVENOUS | Status: DC | PRN
  Administered 2016-01-06: 500 mL

## 2016-01-06 MED ORDER — PROPOFOL 500 MG/50ML IV EMUL
INTRAVENOUS | Status: DC | PRN
  Administered 2016-01-06: 50 ug/kg/min via INTRAVENOUS

## 2016-01-06 MED ORDER — ONDANSETRON HCL 4 MG/2ML IJ SOLN: 4.0000 mg | Freq: Once | INTRAMUSCULAR | Status: DC | PRN

## 2016-01-06 MED ORDER — FENTANYL CITRATE (PF) 100 MCG/2ML IJ SOLN
INTRAMUSCULAR | Status: AC
  Filled 2016-01-06: qty 2

## 2016-01-06 MED ORDER — LIDOCAINE 2% (20 MG/ML) 5 ML SYRINGE
INTRAMUSCULAR | Status: AC
  Filled 2016-01-06: qty 5

## 2016-01-06 MED ORDER — SCOPOLAMINE 1 MG/3DAYS TD PT72
MEDICATED_PATCH | TRANSDERMAL | Status: DC | PRN
  Administered 2016-01-06: 1 via TRANSDERMAL

## 2016-01-06 MED ORDER — SCOPOLAMINE 1 MG/3DAYS TD PT72
MEDICATED_PATCH | TRANSDERMAL | Status: AC
  Filled 2016-01-06: qty 1

## 2016-01-06 MED ORDER — FENTANYL CITRATE (PF) 100 MCG/2ML IJ SOLN
INTRAMUSCULAR | Status: DC | PRN
  Administered 2016-01-06: 100 ug via INTRAVENOUS
  Administered 2016-01-06: 50 ug via INTRAVENOUS

## 2016-01-06 MED ORDER — LIDOCAINE HCL (CARDIAC) 20 MG/ML IV SOLN
INTRAVENOUS | Status: DC | PRN
  Administered 2016-01-06: 80 mg via INTRAVENOUS

## 2016-01-06 MED ORDER — SODIUM CHLORIDE 0.9 % IV SOLN
INTRAVENOUS | Status: DC | PRN
  Administered 2016-01-06: 22:00:00

## 2016-01-06 MED ORDER — DEXTROSE 5 % IV SOLN
INTRAVENOUS | Status: AC
  Administered 2016-01-06: 22:00:00
  Filled 2016-01-06: qty 1.5

## 2016-01-06 MED ORDER — IOPAMIDOL (ISOVUE-300) INJECTION 61%
INTRAVENOUS | Status: AC
Start: 2016-01-06 — End: 2016-01-06
  Filled 2016-01-06: qty 50

## 2016-01-06 MED ORDER — PHENYLEPHRINE HCL 10 MG/ML IJ SOLN
INTRAVENOUS | Status: DC | PRN
  Administered 2016-01-06: 50 ug/min via INTRAVENOUS

## 2016-01-06 MED ORDER — SUGAMMADEX SODIUM 200 MG/2ML IV SOLN
INTRAVENOUS | Status: DC | PRN
  Administered 2016-01-06: 200 mg via INTRAVENOUS

## 2016-01-06 MED ORDER — SUCCINYLCHOLINE CHLORIDE 20 MG/ML IJ SOLN
INTRAMUSCULAR | Status: DC | PRN
  Administered 2016-01-06: 100 mg via INTRAVENOUS

## 2016-01-06 MED ORDER — PROPOFOL 10 MG/ML IV BOLUS
INTRAVENOUS | Status: DC | PRN
  Administered 2016-01-06: 100 mg via INTRAVENOUS

## 2016-01-06 MED ORDER — 0.9 % SODIUM CHLORIDE (POUR BTL) OPTIME
TOPICAL | Status: DC | PRN
  Administered 2016-01-06: 1000 mL

## 2016-01-06 MED ORDER — LACTATED RINGERS IV SOLN
INTRAVENOUS | Status: DC | PRN
  Administered 2016-01-06: 15:00:00 via INTRAVENOUS

## 2016-01-06 SURGICAL SUPPLY — 58 items
BANDAGE ESMARK 6X9 LF (GAUZE/BANDAGES/DRESSINGS) IMPLANT
BNDG ESMARK 6X9 LF (GAUZE/BANDAGES/DRESSINGS)
CANISTER SUCTION 2500CC (MISCELLANEOUS) ×3 IMPLANT
CANNULA VESSEL 3MM 2 BLNT TIP (CANNULA) ×6 IMPLANT
CATH EMB 3FR 80CM (CATHETERS) ×3 IMPLANT
CLIP TI MEDIUM 24 (CLIP) ×3 IMPLANT
CLIP TI WIDE RED SMALL 24 (CLIP) ×3 IMPLANT
CUFF TOURNIQUET SINGLE 24IN (TOURNIQUET CUFF) IMPLANT
CUFF TOURNIQUET SINGLE 34IN LL (TOURNIQUET CUFF) IMPLANT
CUFF TOURNIQUET SINGLE 44IN (TOURNIQUET CUFF) IMPLANT
DRAIN SNY WOU (WOUND CARE) IMPLANT
DRAPE PROXIMA HALF (DRAPES) IMPLANT
DRAPE X-RAY CASS 24X20 (DRAPES) IMPLANT
ELECT REM PT RETURN 9FT ADLT (ELECTROSURGICAL) ×3
ELECTRODE REM PT RTRN 9FT ADLT (ELECTROSURGICAL) ×1 IMPLANT
EVACUATOR SILICONE 100CC (DRAIN) IMPLANT
GAUZE SPONGE 4X4 16PLY XRAY LF (GAUZE/BANDAGES/DRESSINGS) ×3 IMPLANT
GLOVE BIO SURGEON STRL SZ 6.5 (GLOVE) ×8 IMPLANT
GLOVE BIO SURGEON STRL SZ7.5 (GLOVE) ×3 IMPLANT
GLOVE BIO SURGEONS STRL SZ 6.5 (GLOVE) ×4
GLOVE BIOGEL PI IND STRL 6.5 (GLOVE) ×6 IMPLANT
GLOVE BIOGEL PI INDICATOR 6.5 (GLOVE) ×12
GLOVE SS BIOGEL STRL SZ 7.5 (GLOVE) ×1 IMPLANT
GLOVE SUPERSENSE BIOGEL SZ 7.5 (GLOVE) ×2
GOWN STRL REUS W/ TWL LRG LVL3 (GOWN DISPOSABLE) ×4 IMPLANT
GOWN STRL REUS W/TWL LRG LVL3 (GOWN DISPOSABLE) ×8
GRAFT PROPATEN THIN WALL 6X80 (Vascular Products) ×3 IMPLANT
HEMOSTAT SPONGE AVITENE ULTRA (HEMOSTASIS) IMPLANT
KIT BASIN OR (CUSTOM PROCEDURE TRAY) ×3 IMPLANT
KIT PREVENA INCISION MGT 13 (CANNISTER) ×3 IMPLANT
KIT ROOM TURNOVER OR (KITS) ×3 IMPLANT
LIQUID BAND (GAUZE/BANDAGES/DRESSINGS) ×6 IMPLANT
LOOP VESSEL MAXI BLUE (MISCELLANEOUS) ×3 IMPLANT
NS IRRIG 1000ML POUR BTL (IV SOLUTION) ×6 IMPLANT
PACK PERIPHERAL VASCULAR (CUSTOM PROCEDURE TRAY) ×3 IMPLANT
PAD ARMBOARD 7.5X6 YLW CONV (MISCELLANEOUS) ×6 IMPLANT
PADDING CAST COTTON 6X4 STRL (CAST SUPPLIES) IMPLANT
SET COLLECT BLD 21X3/4 12 (NEEDLE) IMPLANT
STAPLER VISISTAT 35W (STAPLE) IMPLANT
STOPCOCK 4 WAY LG BORE MALE ST (IV SETS) IMPLANT
SUT PROLENE 5 0 C 1 24 (SUTURE) ×3 IMPLANT
SUT PROLENE 6 0 CC (SUTURE) ×12 IMPLANT
SUT PROLENE 7 0 BV 1 (SUTURE) IMPLANT
SUT PROLENE 7 0 BV1 MDA (SUTURE) IMPLANT
SUT SILK 2 0 SH (SUTURE) ×3 IMPLANT
SUT SILK 3 0 (SUTURE)
SUT SILK 3-0 18XBRD TIE 12 (SUTURE) IMPLANT
SUT VIC AB 2-0 SH 27 (SUTURE) ×2
SUT VIC AB 2-0 SH 27XBRD (SUTURE) ×1 IMPLANT
SUT VIC AB 3-0 SH 27 (SUTURE) ×8
SUT VIC AB 3-0 SH 27X BRD (SUTURE) ×4 IMPLANT
SUT VIC AB 4-0 PS2 27 (SUTURE) ×6 IMPLANT
SYR 3ML LL SCALE MARK (SYRINGE) ×3 IMPLANT
TAPE UMBILICAL COTTON 1/8X30 (MISCELLANEOUS) IMPLANT
TRAY FOLEY W/METER SILVER 16FR (SET/KITS/TRAYS/PACK) ×3 IMPLANT
TUBING EXTENTION W/L.L. (IV SETS) IMPLANT
UNDERPAD 30X30 INCONTINENT (UNDERPADS AND DIAPERS) ×3 IMPLANT
WATER STERILE IRR 1000ML POUR (IV SOLUTION) ×3 IMPLANT

## 2016-01-06 SURGICAL SUPPLY — 51 items
BANDAGE ESMARK 6X9 LF (GAUZE/BANDAGES/DRESSINGS) IMPLANT
BENZOIN TINCTURE PRP APPL 2/3 (GAUZE/BANDAGES/DRESSINGS) ×3 IMPLANT
BNDG ESMARK 6X9 LF (GAUZE/BANDAGES/DRESSINGS)
CANISTER SUCTION 2500CC (MISCELLANEOUS) ×3 IMPLANT
CANNULA VESSEL 3MM 2 BLNT TIP (CANNULA) ×6 IMPLANT
CATH EMB 3FR 80CM (CATHETERS) ×3 IMPLANT
CATH EMB 4FR 80CM (CATHETERS) ×3 IMPLANT
CLIP LIGATING EXTRA MED SLVR (CLIP) ×3 IMPLANT
CLIP LIGATING EXTRA SM BLUE (MISCELLANEOUS) ×3 IMPLANT
CLOSURE STERI-STRIP 1/2X4 (GAUZE/BANDAGES/DRESSINGS) ×1
CLOSURE WOUND 1/2 X4 (GAUZE/BANDAGES/DRESSINGS) ×1
CLSR STERI-STRIP ANTIMIC 1/2X4 (GAUZE/BANDAGES/DRESSINGS) ×2 IMPLANT
CUFF TOURNIQUET SINGLE 34IN LL (TOURNIQUET CUFF) IMPLANT
CUFF TOURNIQUET SINGLE 44IN (TOURNIQUET CUFF) IMPLANT
DRAIN SNY 10X20 3/4 PERF (WOUND CARE) IMPLANT
DRAPE PROXIMA HALF (DRAPES) IMPLANT
DRAPE X-RAY CASS 24X20 (DRAPES) IMPLANT
DRSG COVADERM 4X10 (GAUZE/BANDAGES/DRESSINGS) IMPLANT
DRSG COVADERM 4X6 (GAUZE/BANDAGES/DRESSINGS) ×3 IMPLANT
DRSG COVADERM 4X8 (GAUZE/BANDAGES/DRESSINGS) IMPLANT
ELECT REM PT RETURN 9FT ADLT (ELECTROSURGICAL) ×3
ELECTRODE REM PT RTRN 9FT ADLT (ELECTROSURGICAL) ×1 IMPLANT
EVACUATOR SILICONE 100CC (DRAIN) IMPLANT
GAUZE SPONGE 4X4 12PLY STRL (GAUZE/BANDAGES/DRESSINGS) ×3 IMPLANT
GLOVE SS BIOGEL STRL SZ 7.5 (GLOVE) ×1 IMPLANT
GLOVE SUPERSENSE BIOGEL SZ 7.5 (GLOVE) ×2
GOWN STRL REUS W/ TWL LRG LVL3 (GOWN DISPOSABLE) ×3 IMPLANT
GOWN STRL REUS W/TWL LRG LVL3 (GOWN DISPOSABLE) ×6
INSERT FOGARTY SM (MISCELLANEOUS) IMPLANT
KIT BASIN OR (CUSTOM PROCEDURE TRAY) ×3 IMPLANT
KIT ROOM TURNOVER OR (KITS) ×3 IMPLANT
NS IRRIG 1000ML POUR BTL (IV SOLUTION) ×6 IMPLANT
PACK PERIPHERAL VASCULAR (CUSTOM PROCEDURE TRAY) ×3 IMPLANT
PAD ARMBOARD 7.5X6 YLW CONV (MISCELLANEOUS) ×6 IMPLANT
PADDING CAST COTTON 6X4 STRL (CAST SUPPLIES) IMPLANT
SET COLLECT BLD 21X3/4 12 (NEEDLE) IMPLANT
STAPLER VISISTAT 35W (STAPLE) IMPLANT
STOPCOCK 4 WAY LG BORE MALE ST (IV SETS) IMPLANT
STRIP CLOSURE SKIN 1/2X4 (GAUZE/BANDAGES/DRESSINGS) ×2 IMPLANT
SUT ETHILON 3 0 PS 1 (SUTURE) IMPLANT
SUT PROLENE 5 0 C 1 24 (SUTURE) ×3 IMPLANT
SUT PROLENE 6 0 CC (SUTURE) ×6 IMPLANT
SUT SILK 2 0 SH (SUTURE) ×3 IMPLANT
SUT VIC AB 2-0 CTX 36 (SUTURE) ×6 IMPLANT
SUT VIC AB 3-0 SH 27 (SUTURE) ×4
SUT VIC AB 3-0 SH 27X BRD (SUTURE) ×2 IMPLANT
SYR 3ML LL SCALE MARK (SYRINGE) ×3 IMPLANT
TRAY FOLEY W/METER SILVER 16FR (SET/KITS/TRAYS/PACK) ×3 IMPLANT
TUBING EXTENTION W/L.L. (IV SETS) IMPLANT
UNDERPAD 30X30 INCONTINENT (UNDERPADS AND DIAPERS) ×3 IMPLANT
WATER STERILE IRR 1000ML POUR (IV SOLUTION) ×3 IMPLANT

## 2016-01-06 NOTE — Op Note (Signed)
OPERATIVE REPORT  DATE OF SURGERY: 01/06/2016  PATIENT: Raymond Jacobson, 75 y.o. male MRN: [id number]  DOB: 16-Mar-1941  PRE-OPERATIVE DIAGNOSIS: Recurrent occlusion of left femoral-popliteal bypass  POST-OPERATIVE DIAGNOSIS:  Same  PROCEDURE: Thrombectomy and revision of distal anastomosis of left femoral to above-knee Gore-Tex bypass  SURGEON:  Curt Jews, M.D.  PHYSICIAN ASSISTANT: Silva Bandy PA-C  ANESTHESIA:  Gen.  EBL: 250 ml  Total I/O In: 1828.3 [I.V.:1828.3] Out: 800 [Urine:550; Blood:250]  BLOOD ADMINISTERED: None  DRAINS: None  SPECIMEN: None  COUNTS CORRECT:  YES  PLAN OF CARE: PACU   PATIENT DISPOSITION:  PACU - hemodynamically stable  PROCEDURE DETAILS: 75 year old gentleman with recent left femoral to above-knee popliteal bypass with pain. He suffered a [name] reocclusion was taken back to the operating room by Dr. fields today for conversion to a Gore-Tex left femoral to above-knee popliteal bypass. He had recurrent occlusion following surgery with sensory loss in his foot and no palpable popliteal pulse. He was taken back to the operating room this time for thrombectomy and revision.  Left groin and leg were prepped and draped in usual sterile fashion. The prior above knee popliteal medial exposure wound was reopened and the graft was thrombosed. Had been anastomosed to the distal vein graft with a small cuff of the vein graft and left on the native above-knee popliteal artery. There was near flush occlusion of the superficial femoral artery proximal to the anastomosis. The graft was anastomosis was taken down from the Gore-Tex to the vein. The arterial anastomosis the artery was thrombectomized with a 4 Fogarty administered 3 Fogarty. This would pass to approximately mid calf level there was good backbleeding. Next the 4 Fogarty cath was passed proximally and easily passed through the common femoral anastomosis. There was excellent inflow. The patient had been  given 8000 units prior to initiating the thrombectomy with Fogarty catheter. The popliteal artery was widely patent at the level of the anastomosis. A 4 dilator passed with no resistance through this level of the artery. Since the superficial femoral artery proximally was occluded chronically decision was made to convert this to an end to end anastomosis from an end-to-side anastomosis. Superficial femoral artery was ligated just prior to the prior anastomosis. The old vein graft was completely removed from the popliteal artery. Popliteal artery was transected and spatulated. The Gore-Tex graft was cut to the appropriate configuration and was sewn into into the above-knee popliteal artery with a running 6-0 Prolene suture. Prior to completion of the anastomosis the usual flushing maneuvers were undertaken. Anastomosis was completed and clamps were removed. There was good Doppler dependent type graft dependent flow at the anterior tibial and in the popliteal artery. Intraoperative arteriogram was obtained through a 18-gauge butterfly needle through the Gore-Tex graft with proximal occlusion showed the mild irregularity throughout the popliteal artery. Runoff was mainly via the anterior tibial artery. The peroneal artery was patent to the mid calf and then showed either spasm or occlusion. The butterfly needle was removed from the Gore-Tex graft and the hole in the graft was closed with a 6-0 Prolene suture. The the heparin was not reversed with protamine. The wound was irrigated with saline and hemostasis was obtained with electrocautery. The wound was closed with several layers of 2-0 Vicryl in the fascia and subcutaneous tissue. Skin was closed with 3-0 subcuticular Vicryl suture. Sterile dressing was applied and the patient was transferred to the recovery room in stable condition   Curt Jews, M.D. 01/06/2016 11:33 PM

## 2016-01-06 NOTE — Progress Notes (Signed)
Patient arrived to 3S09 via bed from PACU. Patient hooked up to heart monitor and CCMD notified of arrival.  Patient's pedal pulses are undetectable with doppler but PACU RN states that Dr. Donnetta Hutching is satisfied with a popliteal pulse for now.  This is palpable but faint.  Will continue to monitor patient.

## 2016-01-06 NOTE — Anesthesia Preprocedure Evaluation (Addendum)
Anesthesia Evaluation  Patient identified by MRN, date of birth, ID band Patient awake    Reviewed: Allergy & Precautions, NPO status , Patient's Chart, lab work & pertinent test results  History of Anesthesia Complications (+) PONV  Airway Mallampati: II  TM Distance: >3 FB Neck ROM: Full    Dental  (+) Edentulous Upper, Edentulous Lower   Pulmonary former smoker,    breath sounds clear to auscultation       Cardiovascular hypertension, Pt. on medications + CAD and + Peripheral Vascular Disease   Rhythm:Regular Rate:Normal     Neuro/Psych    GI/Hepatic GERD  Medicated and Controlled,  Endo/Other    Renal/GU      Musculoskeletal   Abdominal   Peds  Hematology   Anesthesia Other Findings No nausea or vomiting after anesthetic today (01/06/16).  Reproductive/Obstetrics                            Anesthesia Physical Anesthesia Plan  ASA: III and emergent  Anesthesia Plan: General   Post-op Pain Management:    Induction: Intravenous, Rapid sequence and Cricoid pressure planned  Airway Management Planned: Oral ETT  Additional Equipment:   Intra-op Plan:   Post-operative Plan: Extubation in OR  Informed Consent: I have reviewed the patients History and Physical, chart, labs and discussed the procedure including the risks, benefits and alternatives for the proposed anesthesia with the patient or authorized representative who has indicated his/her understanding and acceptance.     Plan Discussed with: CRNA, Anesthesiologist and Surgeon  Anesthesia Plan Comments:         Anesthesia Quick Evaluation

## 2016-01-06 NOTE — Progress Notes (Signed)
Patient ID: Raymond Jacobson, male   DOB: [birth year], 75 y.o.   MRN: [id number] The patient has had recurrent ischemia of his left foot. He now has no palpable popliteal pulse. His foot is markedly cool compared to his right and he has no sensory function. He does have diminished motor function. Discussed options with the patient, his daughter and significant other present. He states he is ready for amputation. I explained that I would recommend attempted thrombectomy and possible revision to below-knee. Explained that he does appear to have adequate runoff to keep the graft patent. Explained that with his sensory loss that he would have permanent deficit if we waited until the morning to do this. He understands and wishes to proceed with return to the operating room tonight for thrombectomy and probable revision of his bypass.

## 2016-01-06 NOTE — Op Note (Signed)
Procedure: Left femoral to above-knee popliteal bypass with Propaten Preoperative diagnosis: Claudication left leg Postoperative diagnosis: Same  Anesthesia: General  Asst.: Curt Jews, MD, Silva Bandy, PA-C  Operative findings: #1 Occluded vein fem pop                                #2 Replaced graft with 6 mm Propaten vein cuff proximally and distally  Operative details: After obtaining informed consent, the patient was taken to the operating room. The patient was placed in supine position on the operating room table. After induction of general anesthesia and endotracheal intubation, a Foley catheter was placed. Next, the patient's entire left lower extremity was prepped and draped in the usual sterile fashion. A longitudinal incision was then made in the left groin and carried down through the subcutaneous tissues to expose the left common femoral artery. The common femoral artery was dissected free circumferentially. There was a pulse within the common femoral artery. A vessel loop was also placed around the distal external iliac artery. Dissection was then carried out the level of the femoral bifurcation. The superficial femoral and profunda femoris arteries were dissected free circumferentially and vessel loops placed around these. The proximal existing fem pop did have a pulse within it.  Next, a longitudinal incision was made on the medial aspect of the right leg above the knee. The incision was deepened down to the level of the fascia. The fascia was opened and the sartorius muscle reflected posteriorly. The above-knee popliteal space was entered. Dissection was carried down to the level of the above-knee popliteal artery bypass and this was dissected free circumferentially.  The graft was occluded at this level.  A subsartorial tunnel was created connecting the above-knee incision to the groin. The patient was then given 8000 units of intravenous heparin. The bypass was transected just above the  anastomosis.    After appropriate circulation time of the heparin, the distal right external iliac artery was controlled with a vessel loop. The profunda femoris and superficial femoral arteries were controlled with Vesseloops. The old vein was transected just below the anastomosis leaving a vein cuff.  A 6 mm Propaten PTFE was then brought on the field and beveled and sewn end to end to this vein cuff using a running 6 0 prolene.  At completion everything was flushed and the anastomosis secured.  It was found to be hemostatic.  The graft was then brought through the subsartorial tunnel down to the above-knee popliteal artery. The above-knee popliteal artery was controlled proximally with a vessel loop and distally with a vessel loop. A longitudinal opening was made in the pre existing vein graft.  There was still thrombus at the level of the anastomosis so a 3 Fogarty was passed proximally and distally several pass to remove all of this.  There was brisk backbleeding from the popliteal artery. The graft was then cut to length and spatulated and signed end of graft to side of the distal vein cuff using running 6-0 Prolene suture. At completion of the anastomosis everything was forebled backbled and thoroughly flushed. The remainder of the anastomosis was completed and all clamps were removed restoring pulsatile flow to the above-knee popliteal artery.    The patient had monophasic to biphasic Doppler flow in the dorsalis pedis and peroneal area of the foot. This augmented approximately 100% with unclamping the graft.   After hemostasis was obtained, the deep layers and  subcutaneous layers of the above-knee popliteal incision were closed with running 3-0 Vicryl suture. The skin was closed with a 4-0 Vicryl subcuticular stitch.  The groin was inspected and found to be hemostatic. This was then closed in multiple layers of running 2 0 and 3-0 Vicryl suture and 4-0 subcuticular stitch. A provena vac was applied.  The patient tolerated the procedure well and there were no complications. Instrument sponge and needle counts correct in the case. Patient was taken to the recovery in stable condition.   Ruta Hinds, MD  [facility] [place]  Office: [phone]  Pager: [phone]

## 2016-01-06 NOTE — Progress Notes (Signed)
[place] for Heparin Indication: atrial fibrillation + occluded L leg bypass   Allergies  Allergen Reactions  . No Known Allergies     Patient Measurements: Height: 5\' 8"  (172.7 cm) Weight: 194 lb (88 kg) IBW/kg (Calculated) : 68.4 Heparin Dosing Weight: 86.5 kg  Vital Signs: Temp: 98.5 F (36.9 C) (08/07 0514) Temp Source: Oral (08/07 0514) BP: 109/50 (08/07 0514) Pulse Rate: 89 (08/07 0514)  Labs:  Recent Labs  01/04/16 1000 01/05/16 0218 01/05/16 1149 01/06/16 0210  HGB 10.7* 9.6*  --  9.5*  HCT 31.5* 28.7*  --  28.0*  PLT 179 167  --  201  HEPARINUNFRC 0.37 0.25* 0.40 0.42  CREATININE  --   --   --  1.50*    Estimated Creatinine Clearance: 45.9 mL/min (by C-G formula based on SCr of 1.5 mg/dL).   Assessment: 75 yo male with h/o AFib, Pradaxa on hold for redo fem pop bypass today, rx consult for heparin.  -HL= 0.42  Goal of Therapy:  Heparin level 0.3-0.7 units/ml Monitor platelets by anticoagulation protocol: Yes   Plan:  -Continue Heparin 1450 units/hr -Daily HL, CBC -Will follow post procedure  Hildred Laser, Pharm D 01/06/2016 7:51 AM

## 2016-01-06 NOTE — Transfer of Care (Signed)
Immediate Anesthesia Transfer of Care Note  Patient: Raymond Jacobson  Procedure(s) Performed: Procedure(s): REDO FEMORAL to BELOW THE KNEE POPLITEAL ARTERY BYPASS USING 22mm PROPATEN GRAFT (Left)  Patient Location: PACU  Anesthesia Type:General  Level of Consciousness: awake, oriented, sedated, patient cooperative and responds to stimulation  Airway & Oxygen Therapy: Patient Spontanous Breathing and Patient connected to face mask oxygen  Post-op Assessment: Report given to RN, Post -op Vital signs reviewed and stable, Patient moving all extremities and Patient moving all extremities X 4  Post vital signs: Reviewed and stable  Last Vitals:  Vitals:   01/06/16 0514 01/06/16 1024  BP: (!) 109/50 (!) 129/57  Pulse: 89 97  Resp: 18   Temp: 36.9 C     Last Pain:  Vitals:   01/06/16 0514  TempSrc: Oral  PainSc:          Complications: No apparent anesthesia complications

## 2016-01-06 NOTE — Progress Notes (Signed)
Patient c/o no feeling in left foot.  Family is concerned and wishes to see Dr. Donnetta Hutching.  Dr. Donnetta Hutching paged. Foot is cold, pulses are still absent.  Patient also c/o charlie horse like cramp in his calf. Popliteal pulse is heard with doppler.   **Addendum: Dr. Donnetta Hutching present in room, plan is to go back to OR tonight. Consent signed.

## 2016-01-06 NOTE — Anesthesia Procedure Notes (Signed)
Procedure Name: Intubation Date/Time: 01/06/2016 10:08 PM Performed by: Valetta Fuller Pre-anesthesia Checklist: Patient identified, Emergency Drugs available, Suction available and Patient being monitored Patient Re-evaluated:Patient Re-evaluated prior to inductionOxygen Delivery Method: Circle system utilized Preoxygenation: Pre-oxygenation with 100% oxygen Intubation Type: IV induction, Rapid sequence and Cricoid Pressure applied Laryngoscope Size: Miller and 2 Grade View: Grade I Tube type: Oral Tube size: 7.5 mm Number of attempts: 1 Airway Equipment and Method: Stylet Placement Confirmation: ETT inserted through vocal cords under direct vision,  positive ETCO2 and breath sounds checked- equal and bilateral Secured at: 23 cm Tube secured with: Tape Dental Injury: Teeth and Oropharynx as per pre-operative assessment

## 2016-01-06 NOTE — Addendum Note (Signed)
Addendum  created 01/06/16 1349 by Shirlyn Goltz, CRNA   Anesthesia Staff edited

## 2016-01-06 NOTE — Anesthesia Procedure Notes (Addendum)
Procedure Name: Intubation Date/Time: 01/06/2016 2:27 PM Performed by: Jacquiline Doe A Pre-anesthesia Checklist: Patient identified, Emergency Drugs available, Suction available and Patient being monitored Patient Re-evaluated:Patient Re-evaluated prior to inductionOxygen Delivery Method: Circle System Utilized Preoxygenation: Pre-oxygenation with 100% oxygen Intubation Type: IV induction and Cricoid Pressure applied Ventilation: Mask ventilation without difficulty Laryngoscope Size: Mac and 4 Grade View: Grade I Tube type: Oral Tube size: 7.5 mm Number of attempts: 1 Airway Equipment and Method: Stylet and Oral airway Placement Confirmation: ETT inserted through vocal cords under direct vision,  positive ETCO2 and breath sounds checked- equal and bilateral Secured at: 22 cm Tube secured with: Tape Dental Injury: Teeth and Oropharynx as per pre-operative assessment

## 2016-01-06 NOTE — Transfer of Care (Signed)
Immediate Anesthesia Transfer of Care Note  Patient: Raymond Jacobson  Procedure(s) Performed: Procedure(s): THROMBECTOMY WITH REVISION oF dISTAL Anastomosis BYPASS GRAFT FEMORAL-POPLITEAL ARTERY. (Left) ANGIOGRAM EXTREMITY LEFT (Left)  Patient Location: PACU  Anesthesia Type:General  Level of Consciousness: sedated and patient cooperative  Airway & Oxygen Therapy: Patient connected to nasal cannula oxygen  Post-op Assessment: Report given to RN and Post -op Vital signs reviewed and stable  Post vital signs: Reviewed and stable  Last Vitals:  Vitals:   01/06/16 1931 01/06/16 2025  BP: 105/66 (!) 116/57  Pulse: 84 85  Resp: 14 (!) 24  Temp:  36.7 C    Last Pain:  Vitals:   01/06/16 2100  TempSrc:   PainSc: 10-Worst pain ever      Patients Stated Pain Goal: 2 ([id number])  Complications: No apparent anesthesia complications

## 2016-01-06 NOTE — Progress Notes (Signed)
CRITICAL VALUE ALERT  Critical value received:  2.6 KCl  Date of notification:  01/06/16  Time of notification:  03:15am  Critical value read back:yes  Nurse who received alert:  Narek Kniss  MD notified (1st page):  Scot Dock  Time of first page:  03:27am  MD notified (2nd page):  Time of second page:  Responding MD:  Scot Dock  Time MD responded:  03:28am

## 2016-01-06 NOTE — Progress Notes (Addendum)
  [facility] Day [facility] Note  Called by PACU RN regarding loss of doppler signals left foot and mottled appearance. Patient currently reporting pain with left leg incisions. He does report some left heel pain and some numbness that has been present since pre-op.   On exam, the left foot is cooler than the right. DP and peroneal doppler with monophasic/biphasic flow which is similar to intra-op findings. Motor and sensory function intact to left foot. No plans for surgical intervention. Dr. Donnetta Hutching to evaluate patient.    Virgina Jock, [place] [phone] 01/06/2016 6:26 PM  I have examined the patient, reviewed and agree with above. Patient is comfortable. Motor and sensory function intact in his foot. Easily palpable left popliteal pulse below his bypass. Does have peroneal Doppler signal above the ankle. Will continue to observe.  Curt Jews, MD 01/06/2016 7:08 PM

## 2016-01-06 NOTE — H&P (View-Only) (Signed)
  Progress Note 10:52 AM 4 Days Post-Op  Subjective:  Wife states his BP has been low; pt states he is having a pain/numbness in his heels  Afebrile HR  70's-80's NSR [id number] systolic [id number]  Vitals:   01/04/16 2009 01/05/16 0503  BP: 110/60 (!) 95/48  Pulse:  82  Resp:  20  Temp:  98 F (36.7 C)    Physical Exam: Cardiac:  regular Lungs:  Non labored Incisions:  All are clean and dry.  I am unable to express any drainage from the left groin and left proximal vein graft harvest site.  Extremities:  Left foot is warm and adequately perfused.    CBC    Component Value Date/Time   WBC 7.0 01/05/2016 0218   RBC 3.30 (L) 01/05/2016 0218   HGB 9.6 (L) 01/05/2016 0218   HCT 28.7 (L) 01/05/2016 0218   PLT 167 01/05/2016 0218   MCV 87.0 01/05/2016 0218   MCH 29.1 01/05/2016 0218   MCHC 33.4 01/05/2016 0218   RDW 14.2 01/05/2016 0218   LYMPHSABS 1.2 10/01/2015 0901   MONOABS 0.4 10/01/2015 0901   EOSABS 0.1 10/01/2015 0901   BASOSABS 0.1 10/01/2015 0901    BMET    Component Value Date/Time   NA 137 01/02/2016 0633   K 4.4 01/02/2016 0633   CL 104 01/02/2016 0633   CO2 26 01/02/2016 0633   GLUCOSE 134 (H) 01/02/2016 0633   BUN 12 01/02/2016 0633   CREATININE 1.24 01/02/2016 0633   CALCIUM 8.2 (L) 01/02/2016 0633   GFRNONAA 55 (L) 01/02/2016 0633   GFRAA >60 01/02/2016 0633    INR    Component Value Date/Time   INR 1.00 01/01/2016 0705     Intake/Output Summary (Last 24 hours) at 01/05/16 1052 Last data filed at 01/05/16 0508  Gross per 24 hour  Intake                0 ml  Output              125 ml  Net             -125 ml   Assessment:  75 y.o. male is s/p:  Left femoral endarterectomy, Leftfemoral to above-knee popliteal bypass and  Thrombectomy of left femoropopliteal bypass, intraoperative arteriogram  4 Days Post-Op  Plan: -pt for redo left femoral to popliteal bypass grafting tomorrow.  Npo after MN.  (pt has been pre-op'd) -pt was  up to chair yesterday, but has not ambulated in a day or two bc of drainage from the proximal vein harvest site and left groin.  I have tried to express fluid from both of these incisions and cannot get anything.  Even if he is having drainage, he needs to be walking if steady.  He has been walking to the bathroom.  I have discussed with family and RN to walk with pt's in the hall.  Also, have asked we get an incentive spirometer to the room. -DVT prophylaxis:  Heparin gtt, which will be discontinued on call to the OR. -float heels off the bed when in bed. -BP soft-continue to hold BP meds  Leontine Locket, PA-C [facility] [phone] 01/05/2016 10:52 AM  I have interviewed the patient and examined the patient. I agree with the findings by the PA. Left foot stable.  On heparin (stop on call to OR)  Gae Gallop, MD [phone]

## 2016-01-06 NOTE — Anesthesia Postprocedure Evaluation (Signed)
Anesthesia Post Note  Patient: Iran Walkenhorst  Procedure(s) Performed: Procedure(s) (LRB): REDO FEMORAL to BELOW THE KNEE POPLITEAL ARTERY BYPASS USING 77mm PROPATEN GRAFT (Left)  Patient location during evaluation: PACU Anesthesia Type: General Level of consciousness: awake and alert Pain management: pain level controlled Vital Signs Assessment: post-procedure vital signs reviewed and stable Respiratory status: spontaneous breathing, nonlabored ventilation, respiratory function stable and patient connected to nasal cannula oxygen Cardiovascular status: blood pressure returned to baseline and stable Postop Assessment: no signs of nausea or vomiting Anesthetic complications: no    Last Vitals:  Vitals:   01/06/16 1916 01/06/16 1931  BP: 109/63 105/66  Pulse: 84 84  Resp: 14 14  Temp:      Last Pain:  Vitals:   01/06/16 1945  TempSrc:   PainSc: 0-No pain                 Rustyn Conery A

## 2016-01-06 NOTE — Interval H&P Note (Signed)
History and Physical Interval Note:  01/06/2016 1:40 PM  Raymond Jacobson  has presented today for surgery, with the diagnosis of Occluded left femoral to popliteal bypass graft T82.392D  The various methods of treatment have been discussed with the patient and family. After consideration of risks, benefits and other options for treatment, the patient has consented to  Procedure(s): REDO BYPASS GRAFT FEMORAL-POPLITEAL ARTERY (Left) as a surgical intervention .  The patient's history has been reviewed, patient examined, no change in status, stable for surgery.  I have reviewed the patient's chart and labs.  Questions were answered to the patient's satisfaction.     Ruta Hinds

## 2016-01-06 NOTE — Anesthesia Preprocedure Evaluation (Addendum)
Anesthesia Evaluation  Patient identified by MRN, date of birth, ID band Patient awake    Reviewed: Allergy & Precautions, NPO status , Patient's Chart, lab work & pertinent test results, reviewed documented beta blocker date and time   History of Anesthesia Complications (+) PONV and history of anesthetic complications  Airway Mallampati: II  TM Distance: >3 FB Neck ROM: Full    Dental  (+) Dental Advisory Given, Upper Dentures, Lower Dentures, Edentulous Upper, Edentulous Lower   Pulmonary shortness of breath (recent bronchitis, dry cough), COPD (recent bronchitis, now resolved), former smoker,    breath sounds clear to auscultation       Cardiovascular hypertension, Pt. on medications and Pt. on home beta blockers (-) angina+ CAD, + Past MI, + Cardiac Stents, + Peripheral Vascular Disease and + DVT  + dysrhythmias Atrial Fibrillation  Rhythm:Irregular Rate:Normal  '15 ECHO: EF 60-65%, mild LVH, mod TR  8/2: Left femoral endarterectomy, Leftfemoral to above-knee popliteal bypass and Thrombectomy of left femoropopliteal bypass, intraoperative arteriogram    Neuro/Psych negative neurological ROS  negative psych ROS   GI/Hepatic Neg liver ROS, GERD  Medicated and Controlled,  Endo/Other  negative endocrine ROS  Renal/GU Renal InsufficiencyRenal disease (creat 1.36)     Musculoskeletal  (+) Arthritis , Osteoarthritis,    Abdominal   Peds  Hematology Off Pradaxa since 7/28   Anesthesia Other Findings   Reproductive/Obstetrics                            Anesthesia Physical  Anesthesia Plan  ASA: III  Anesthesia Plan: General   Post-op Pain Management:    Induction: Intravenous  Airway Management Planned: Oral ETT  Additional Equipment: Arterial line  Intra-op Plan:   Post-operative Plan: Extubation in OR  Informed Consent: I have reviewed the patients History and Physical, chart,  labs and discussed the procedure including the risks, benefits and alternatives for the proposed anesthesia with the patient or authorized representative who has indicated his/her understanding and acceptance.   Dental advisory given  Plan Discussed with: CRNA  Anesthesia Plan Comments: (Risks/benefits of general anesthesia discussed with patient including risk of damage to teeth, lips, gum, and tongue, nausea/vomiting, allergic reactions to medications, and the possibility of heart attack, stroke and death.  All patient questions answered.  Patient wishes to proceed.)       Anesthesia Quick Evaluation

## 2016-01-07 ENCOUNTER — Inpatient Hospital Stay (HOSPITAL_COMMUNITY): Payer: No Typology Code available for payment source | Admitting: Critical Care Medicine

## 2016-01-07 ENCOUNTER — Encounter (HOSPITAL_COMMUNITY): Payer: Self-pay | Admitting: Vascular Surgery

## 2016-01-07 ENCOUNTER — Encounter (HOSPITAL_COMMUNITY)
Admission: RE | Disposition: A | Discharge status: Rehabilitation Facility | Payer: Self-pay | Source: Ambulatory Visit | Attending: Vascular Surgery

## 2016-01-07 HISTORY — PX: AMPUTATION: SHX166

## 2016-01-07 LAB — BASIC METABOLIC PANEL
ANION GAP: 9 (ref 5–15)
BUN: 18 mg/dL (ref 6–20)
CALCIUM: 7.9 mg/dL — AB (ref 8.9–10.3)
CO2: 23 mmol/L (ref 22–32)
Chloride: 101 mmol/L (ref 101–111)
Creatinine, Ser: 1.12 mg/dL (ref 0.61–1.24)
GFR calc Af Amer: >60 mL/min (ref >60)
GFR calc non Af Amer: >60 mL/min (ref >60)
GLUCOSE: 220 mg/dL — AB (ref 65–99)
Potassium: 3.2 mmol/L — ABNORMAL LOW (ref 3.5–5.1)
Sodium: 133 mmol/L — ABNORMAL LOW (ref 135–145)

## 2016-01-07 LAB — APTT: aPTT: 87 seconds — ABNORMAL HIGH (ref 24–36)

## 2016-01-07 LAB — CBC
HEMATOCRIT: 25.9 % — AB (ref 39.0–52.0)
HEMOGLOBIN: 8.9 g/dL — AB (ref 13.0–17.0)
MCH: 29.9 pg (ref 26.0–34.0)
MCHC: 34.4 g/dL (ref 30.0–36.0)
MCV: 86.9 fL (ref 78.0–100.0)
Platelets: 198 10*3/uL (ref 150–400)
RBC: 2.98 MIL/uL — ABNORMAL LOW (ref 4.22–5.81)
RDW: 14.4 % (ref 11.5–15.5)
WBC: 11.5 10*3/uL — ABNORMAL HIGH (ref 4.0–10.5)

## 2016-01-07 LAB — CREATININE, SERUM
Creatinine, Ser: 1.06 mg/dL (ref 0.61–1.24)
GFR calc Af Amer: >60 mL/min (ref >60)
GFR calc non Af Amer: >60 mL/min (ref >60)

## 2016-01-07 LAB — HEPARIN LEVEL (UNFRACTIONATED)

## 2016-01-07 SURGERY — AMPUTATION, ABOVE KNEE
Anesthesia: General | Site: Leg Upper | Laterality: Left

## 2016-01-07 MED ORDER — MORPHINE SULFATE (PF) 2 MG/ML IV SOLN
2.0000 mg | INTRAVENOUS | Status: DC | PRN
  Administered 2016-01-07: 4 mg via INTRAVENOUS
  Administered 2016-01-07: 2 mg via INTRAVENOUS
  Administered 2016-01-07 – 2016-01-08 (×2): 4 mg via INTRAVENOUS
  Administered 2016-01-09 (×2): 2 mg via INTRAVENOUS
  Administered 2016-01-09: 4 mg via INTRAVENOUS
  Administered 2016-01-09: 2 mg via INTRAVENOUS
  Administered 2016-01-09 – 2016-01-10 (×2): 4 mg via INTRAVENOUS
  Administered 2016-01-10: 2 mg via INTRAVENOUS
  Administered 2016-01-11 (×3): 4 mg via INTRAVENOUS
  Administered 2016-01-12 – 2016-01-14 (×5): 2 mg via INTRAVENOUS
  Filled 2016-01-07 (×3): qty 1
  Filled 2016-01-07 (×4): qty 2
  Filled 2016-01-07 (×2): qty 1
  Filled 2016-01-07: qty 2
  Filled 2016-01-07: qty 1
  Filled 2016-01-07: qty 2
  Filled 2016-01-07: qty 1
  Filled 2016-01-07: qty 2
  Filled 2016-01-07 (×2): qty 1
  Filled 2016-01-07: qty 2
  Filled 2016-01-07: qty 1
  Filled 2016-01-07 (×2): qty 2

## 2016-01-07 MED ORDER — HYDROMORPHONE 1 MG/ML IV SOLN
INTRAVENOUS | Status: DC
  Administered 2016-01-07: 09:00:00 via INTRAVENOUS
  Administered 2016-01-07: 3.6 mg via INTRAVENOUS
  Administered 2016-01-07: 19:00:00 via INTRAVENOUS
  Administered 2016-01-08: 3.3 mg via INTRAVENOUS
  Administered 2016-01-08: 1.8 mg via INTRAVENOUS
  Filled 2016-01-07 (×2): qty 25

## 2016-01-07 MED ORDER — ACETAMINOPHEN 160 MG/5ML PO SOLN
325.0000 mg | ORAL | Status: DC | PRN
  Filled 2016-01-07: qty 20.3

## 2016-01-07 MED ORDER — OXYCODONE HCL 5 MG PO TABS
ORAL_TABLET | ORAL | Status: AC
  Filled 2016-01-07: qty 1

## 2016-01-07 MED ORDER — 0.9 % SODIUM CHLORIDE (POUR BTL) OPTIME
TOPICAL | Status: DC | PRN
  Administered 2016-01-07: 1000 mL

## 2016-01-07 MED ORDER — PHENYLEPHRINE HCL 10 MG/ML IJ SOLN
INTRAMUSCULAR | Status: DC | PRN
  Administered 2016-01-07: 100 ug via INTRAVENOUS
  Administered 2016-01-07: 60 ug via INTRAVENOUS
  Administered 2016-01-07: 80 ug via INTRAVENOUS
  Administered 2016-01-07: 120 ug via INTRAVENOUS
  Administered 2016-01-07: 40 ug via INTRAVENOUS

## 2016-01-07 MED ORDER — ONDANSETRON HCL 4 MG/2ML IJ SOLN
INTRAMUSCULAR | Status: DC | PRN
  Administered 2016-01-07 (×2): 4 mg via INTRAVENOUS

## 2016-01-07 MED ORDER — HEPARIN SODIUM (PORCINE) 5000 UNIT/ML IJ SOLN
5000.0000 [IU] | Freq: Three times a day (TID) | INTRAMUSCULAR | Status: DC
  Administered 2016-01-07 – 2016-01-08 (×2): 5000 [IU] via SUBCUTANEOUS
  Filled 2016-01-07 (×2): qty 1

## 2016-01-07 MED ORDER — PROPOFOL 10 MG/ML IV BOLUS
INTRAVENOUS | Status: DC | PRN
  Administered 2016-01-07: 140 mg via INTRAVENOUS

## 2016-01-07 MED ORDER — FENTANYL CITRATE (PF) 100 MCG/2ML IJ SOLN
INTRAMUSCULAR | Status: DC | PRN
  Administered 2016-01-07: 50 ug via INTRAVENOUS
  Administered 2016-01-07: 100 ug via INTRAVENOUS

## 2016-01-07 MED ORDER — MIDAZOLAM HCL 2 MG/2ML IJ SOLN
INTRAMUSCULAR | Status: AC
  Filled 2016-01-07: qty 2

## 2016-01-07 MED ORDER — OXYCODONE HCL 5 MG/5ML PO SOLN: 5.0000 mg | Freq: Once | ORAL | Status: AC | PRN

## 2016-01-07 MED ORDER — LIDOCAINE HCL (CARDIAC) 20 MG/ML IV SOLN
INTRAVENOUS | Status: DC | PRN
  Administered 2016-01-07: 60 mg via INTRAVENOUS

## 2016-01-07 MED ORDER — DEXTROSE 5 % IV SOLN
1.5000 g | Freq: Two times a day (BID) | INTRAVENOUS | Status: DC
  Filled 2016-01-07 (×2): qty 1.5

## 2016-01-07 MED ORDER — ACETAMINOPHEN 325 MG PO TABS: 325.0000 mg | ORAL_TABLET | ORAL | Status: DC | PRN

## 2016-01-07 MED ORDER — MIDAZOLAM HCL 5 MG/5ML IJ SOLN
INTRAMUSCULAR | Status: DC | PRN
  Administered 2016-01-07: 2 mg via INTRAVENOUS

## 2016-01-07 MED ORDER — HYDROMORPHONE HCL 1 MG/ML IJ SOLN: 0.2500 mg | INTRAMUSCULAR | Status: DC | PRN

## 2016-01-07 MED ORDER — FENTANYL CITRATE (PF) 250 MCG/5ML IJ SOLN
INTRAMUSCULAR | Status: AC
  Filled 2016-01-07: qty 5

## 2016-01-07 MED ORDER — MORPHINE SULFATE (PF) 2 MG/ML IV SOLN
2.0000 mg | Freq: Once | INTRAVENOUS | Status: AC
  Administered 2016-01-07: 2 mg via INTRAVENOUS

## 2016-01-07 MED ORDER — OXYCODONE HCL 5 MG PO TABS
5.0000 mg | ORAL_TABLET | Freq: Once | ORAL | Status: AC | PRN
  Administered 2016-01-07: 5 mg via ORAL

## 2016-01-07 MED ORDER — GLYCOPYRROLATE 0.2 MG/ML IJ SOLN
INTRAMUSCULAR | Status: DC | PRN
  Administered 2016-01-07: .3 mg via INTRAVENOUS

## 2016-01-07 MED ORDER — POTASSIUM CHLORIDE CRYS ER 20 MEQ PO TBCR: 20.0000 meq | EXTENDED_RELEASE_TABLET | Freq: Every day | ORAL | Status: DC | PRN

## 2016-01-07 MED ORDER — PROPOFOL 500 MG/50ML IV EMUL
INTRAVENOUS | Status: DC | PRN
  Administered 2016-01-07: 50 ug/kg/min via INTRAVENOUS

## 2016-01-07 MED ORDER — LACTATED RINGERS IV SOLN
INTRAVENOUS | Status: DC
  Administered 2016-01-07: 13:00:00 via INTRAVENOUS

## 2016-01-07 MED ORDER — DEXTROSE 5 % IV SOLN
1.5000 g | Freq: Once | INTRAVENOUS | Status: AC
  Administered 2016-01-07: 1.5 g via INTRAVENOUS
  Filled 2016-01-07: qty 1.5

## 2016-01-07 MED ORDER — NALOXONE HCL 0.4 MG/ML IJ SOLN: 0.4000 mg | INTRAMUSCULAR | Status: DC | PRN

## 2016-01-07 MED ORDER — HEPARIN (PORCINE) IN NACL 100-0.45 UNIT/ML-% IJ SOLN
600.0000 [IU]/h | INTRAMUSCULAR | Status: DC
  Administered 2016-01-07: 600 [IU]/h via INTRAVENOUS
  Filled 2016-01-07 (×2): qty 250

## 2016-01-07 MED ORDER — SODIUM CHLORIDE 0.9% FLUSH: 9.0000 mL | INTRAVENOUS | Status: DC | PRN

## 2016-01-07 MED ORDER — DIPHENHYDRAMINE HCL 12.5 MG/5ML PO ELIX: 12.5000 mg | ORAL_SOLUTION | Freq: Four times a day (QID) | ORAL | Status: DC | PRN

## 2016-01-07 MED ORDER — MAGNESIUM SULFATE 2 GM/50ML IV SOLN
2.0000 g | Freq: Every day | INTRAVENOUS | Status: DC | PRN
  Filled 2016-01-07: qty 50

## 2016-01-07 MED ORDER — DEXTROSE 5 % IV SOLN
INTRAVENOUS | Status: AC
  Filled 2016-01-07: qty 1.5

## 2016-01-07 MED ORDER — SUCCINYLCHOLINE CHLORIDE 20 MG/ML IJ SOLN
INTRAMUSCULAR | Status: DC | PRN
  Administered 2016-01-07: 60 mg via INTRAVENOUS

## 2016-01-07 MED ORDER — ONDANSETRON HCL 4 MG/2ML IJ SOLN
4.0000 mg | Freq: Four times a day (QID) | INTRAMUSCULAR | Status: DC | PRN
  Administered 2016-01-08: 4 mg via INTRAVENOUS

## 2016-01-07 MED ORDER — DIPHENHYDRAMINE HCL 50 MG/ML IJ SOLN: 12.5000 mg | Freq: Four times a day (QID) | INTRAMUSCULAR | Status: DC | PRN

## 2016-01-07 SURGICAL SUPPLY — 55 items
BANDAGE ELASTIC 6 VELCRO ST LF (GAUZE/BANDAGES/DRESSINGS) ×2 IMPLANT
BLADE SAW RECIP 87.9 MT (BLADE) ×2 IMPLANT
BNDG COHESIVE 4X5 TAN STRL (GAUZE/BANDAGES/DRESSINGS) ×2 IMPLANT
BNDG COHESIVE 6X5 TAN STRL LF (GAUZE/BANDAGES/DRESSINGS) ×2 IMPLANT
BNDG GAUZE ELAST 4 BULKY (GAUZE/BANDAGES/DRESSINGS) ×2 IMPLANT
CANISTER SUCTION 2500CC (MISCELLANEOUS) ×2 IMPLANT
CLIP TI MEDIUM 6 (CLIP) IMPLANT
COVER SURGICAL LIGHT HANDLE (MISCELLANEOUS) ×2 IMPLANT
COVER TABLE BACK 60X90 (DRAPES) ×2 IMPLANT
DRAIN CHANNEL 19F RND (DRAIN) IMPLANT
DRAPE ORTHO SPLIT 77X108 STRL (DRAPES) ×2
DRAPE PROXIMA HALF (DRAPES) ×2 IMPLANT
DRAPE SURG ORHT 6 SPLT 77X108 (DRAPES) ×2 IMPLANT
DRSG ADAPTIC 3X8 NADH LF (GAUZE/BANDAGES/DRESSINGS) ×2 IMPLANT
ELECT REM PT RETURN 9FT ADLT (ELECTROSURGICAL) ×2
ELECTRODE REM PT RTRN 9FT ADLT (ELECTROSURGICAL) ×1 IMPLANT
EVACUATOR SILICONE 100CC (DRAIN) IMPLANT
GAUZE SPONGE 4X4 12PLY STRL (GAUZE/BANDAGES/DRESSINGS) ×2 IMPLANT
GLOVE BIO SURGEON STRL SZ 6.5 (GLOVE) ×4 IMPLANT
GLOVE BIO SURGEON STRL SZ7.5 (GLOVE) ×2 IMPLANT
GLOVE BIOGEL PI IND STRL 6.5 (GLOVE) ×2 IMPLANT
GLOVE BIOGEL PI IND STRL 7.0 (GLOVE) ×1 IMPLANT
GLOVE BIOGEL PI IND STRL 8 (GLOVE) ×1 IMPLANT
GLOVE BIOGEL PI INDICATOR 6.5 (GLOVE) ×2
GLOVE BIOGEL PI INDICATOR 7.0 (GLOVE) ×1
GLOVE BIOGEL PI INDICATOR 8 (GLOVE) ×1
GLOVE ECLIPSE 6.5 STRL STRAW (GLOVE) ×2 IMPLANT
GOWN STRL REUS W/ TWL LRG LVL3 (GOWN DISPOSABLE) ×4 IMPLANT
GOWN STRL REUS W/TWL LRG LVL3 (GOWN DISPOSABLE) ×4
KIT BASIN OR (CUSTOM PROCEDURE TRAY) ×2 IMPLANT
KIT ROOM TURNOVER OR (KITS) ×2 IMPLANT
NS IRRIG 1000ML POUR BTL (IV SOLUTION) ×2 IMPLANT
PACK GENERAL/GYN (CUSTOM PROCEDURE TRAY) ×2 IMPLANT
PAD ARMBOARD 7.5X6 YLW CONV (MISCELLANEOUS) ×4 IMPLANT
SPONGE GAUZE 4X4 12PLY STER LF (GAUZE/BANDAGES/DRESSINGS) ×2 IMPLANT
STAPLER VISISTAT 35W (STAPLE) ×2 IMPLANT
STOCKINETTE IMPERVIOUS LG (DRAPES) ×2 IMPLANT
SUT ETHILON 3 0 PS 1 (SUTURE) IMPLANT
SUT SILK 2 0 (SUTURE) ×1
SUT SILK 2 0 SH (SUTURE) IMPLANT
SUT SILK 2 0 SH CR/8 (SUTURE) ×2 IMPLANT
SUT SILK 2 0 TIES 10X30 (SUTURE) ×2 IMPLANT
SUT SILK 2-0 18XBRD TIE 12 (SUTURE) ×1 IMPLANT
SUT VIC AB 2-0 CT1 18 (SUTURE) ×4 IMPLANT
SUT VIC AB 2-0 CT1 27 (SUTURE) ×1
SUT VIC AB 2-0 CT1 TAPERPNT 27 (SUTURE) ×1 IMPLANT
SUT VIC AB 2-0 SH 18 (SUTURE) IMPLANT
SUT VIC AB 2-0 SH 27 (SUTURE) ×1
SUT VIC AB 2-0 SH 27XBRD (SUTURE) ×1 IMPLANT
SUT VIC AB 3-0 SH 27 (SUTURE) ×3
SUT VIC AB 3-0 SH 27X BRD (SUTURE) ×3 IMPLANT
TOWEL OR 17X24 6PK STRL BLUE (TOWEL DISPOSABLE) ×2 IMPLANT
TOWEL OR 17X26 10 PK STRL BLUE (TOWEL DISPOSABLE) ×2 IMPLANT
UNDERPAD 30X30 INCONTINENT (UNDERPADS AND DIAPERS) ×2 IMPLANT
WATER STERILE IRR 1000ML POUR (IV SOLUTION) ×2 IMPLANT

## 2016-01-07 NOTE — Progress Notes (Signed)
Patient c/o numbness and pain in left foot.  He describes this as similar to how he felt earlier prior to going back to surgery.  He does have more movement in his foot than he did then and the foot does feel warmer than earlier, although still cool.  Patient was given 3mg  morphine IV.  Will continue to monitor patient.

## 2016-01-07 NOTE — Anesthesia Postprocedure Evaluation (Signed)
Anesthesia Post Note  Patient: Labon Font  Procedure(s) Performed: Procedure(s) (LRB): THROMBECTOMY WITH REVISION oF dISTAL Anastomosis BYPASS GRAFT FEMORAL-POPLITEAL ARTERY. (Left) ANGIOGRAM EXTREMITY LEFT (Left)  Patient location during evaluation: PACU Anesthesia Type: General Level of consciousness: awake and alert Pain management: pain level controlled Vital Signs Assessment: post-procedure vital signs reviewed and stable Respiratory status: spontaneous breathing, nonlabored ventilation, respiratory function stable and patient connected to nasal cannula oxygen Cardiovascular status: blood pressure returned to baseline and stable Postop Assessment: no signs of nausea or vomiting Anesthetic complications: no    Last Vitals:  Vitals:   01/07/16 0338 01/07/16 0400  BP: 117/63 (!) 126/59  Pulse: 80 80  Resp: 17 18  Temp: 37 C     Last Pain:  Vitals:   01/07/16 0514  TempSrc:   PainSc: 10-Worst pain ever                 Roselyne Stalnaker A

## 2016-01-07 NOTE — H&P (View-Only) (Signed)
[facility] [place]  Subjective  - severe pain left foot   Objective (!) 126/59 80 98.6 F (37 C) (Oral) 18 93%  Intake/Output Summary (Last 24 hours) at 01/07/16 0748 Last data filed at 01/07/16 [id number]  Gross per 24 hour  Intake          3728.33 ml  Output             1425 ml  Net          2303.33 ml   Left foot no doppler flow mottled to mid calf  Assessment/Planning: Severe progressive ischemia left leg despite multiple attempts at revascularization I do not believe we have any other options at this point Will proceed with left AKA later today Dilaudid PCA for now for pain control  Ruta Hinds 01/07/2016 7:48 AM --  Laboratory Lab Results:  Recent Labs  01/05/16 0218 01/06/16 0210 01/06/16 1336  WBC 7.0 6.7  --   HGB 9.6* 9.5* 9.9*  HCT 28.7* 28.0* 29.0*  PLT 167 201  --    BMET  Recent Labs  01/06/16 0210 01/06/16 1336 01/07/16 0040  NA 134* 138 133*  K 2.6* 2.8* 3.2*  CL 98*  --  101  CO2 27  --  23  GLUCOSE 141* 154* 220*  BUN 21*  --  18  CREATININE 1.50*  --  1.12  CALCIUM 8.2*  --  7.9*    COAG Lab Results  Component Value Date   INR 1.00 01/01/2016   INR 1.72 (H) 12/24/2015   INR 1.06 10/01/2015   No results found for: PTT

## 2016-01-07 NOTE — Care Management Note (Signed)
Case Management Note  Patient Details  Name: Raymond Jacobson MRN: [id number] Date of Birth: 18-Jun-1940  Subjective/Objective:  S/p Thrombectomy of left femoropopliteal bypass, patient is preoperatively set up with [facility] for HHRN/HHPT.  He follows up with [facility]  [phone].  Fax [phone].  Will fax dc summary at time of dc.  NCM cont to follow for dc needs.                  Action/Plan:   Expected Discharge Date:                  Expected Discharge Plan:  [facility]  In-House Referral:  NA  Discharge planning Services  CM Consult  Post Acute Care Choice:    Choice offered to:     DME Arranged:    DME Agency:     HH Arranged:  PT, RN HH Agency:  [facility]  Status of Service:  Completed, signed off  If discussed at [place] of Stay Meetings, dates discussed:    Additional Comments:  Zenon Mayo, RN 01/07/2016, 4:50 PM

## 2016-01-07 NOTE — Op Note (Signed)
[facility] OPERATIVE NOTE Procedure: Left above knee amputation  Surgeon(s): Elam Dutch, MD  ASSISTANT: Leontine Locket, PA-C  Anesthesia: General  Specimens: left leg  PROCEDURE DETAIL: After obtaining informed consent, the patient was taken to the operating room. The patient was placed in supine position the operating room table. After induction of general anesthesia the patient's entire left lower extremity was prepped and draped in usual sterile fashion. A circumferential incision was made on the left leg just above the knee. The incision was carried down into the sucutaneous tissues  Soft tissues were taken down as well as the muscle and fascia with cautery. A prosthetic fem pop bypass wasdissected free circumferentially clamped and divided. This was ligated proximally. The femoral vein and artery were both suture ligated.  Remainder of the soft tissues were taken down with cautery. The siciatic nerve was ligated high with a vicryl tie and allowed to retract into the thigh.  The periosteum was raised on the femur approximately 5 cm above the skin edge. The femur was divided at this level. The leg was passed off the table as a specimen. Hemostasis was obtained. The wound was thoroughly irrigated with normal saline solution. The fascial edges were reapproximated using interrupted 2 0 Vicryl sutures. The subcutaneous tissues reapproximated using a running 3-0 Vicryl suture. The skin was closed staples. Patient tolerated procedure well and there were no complications. Instrument sponge and needle counts correct in the case. Patient was taken to recovery in stable condition.  Ruta Hinds, MD [facility] [place] Office: [phone] Pager: [phone]

## 2016-01-07 NOTE — Anesthesia Procedure Notes (Signed)
Procedure Name: Intubation Date/Time: 01/07/2016 2:07 PM Performed by: Mariea Clonts Pre-anesthesia Checklist: Emergency Drugs available, Patient identified, Suction available, Timeout performed and Patient being monitored Patient Re-evaluated:Patient Re-evaluated prior to inductionOxygen Delivery Method: Circle system utilized Preoxygenation: Pre-oxygenation with 100% oxygen Intubation Type: IV induction Ventilation: Mask ventilation without difficulty Laryngoscope Size: Miller and 2 Grade View: Grade I Tube type: Oral Tube size: 7.5 mm Number of attempts: 1 Placement Confirmation: ETT inserted through vocal cords under direct vision,  positive ETCO2 and breath sounds checked- equal and bilateral Tube secured with: Tape Dental Injury: Teeth and Oropharynx as per pre-operative assessment

## 2016-01-07 NOTE — Anesthesia Preprocedure Evaluation (Signed)
Anesthesia Evaluation  Patient identified by MRN, date of birth, ID band Patient awake    Reviewed: Allergy & Precautions, NPO status , Patient's Chart, lab work & pertinent test results  History of Anesthesia Complications (+) PONV and history of anesthetic complications  Airway Mallampati: II  TM Distance: >3 FB Neck ROM: Full    Dental  (+) Edentulous Upper, Edentulous Lower   Pulmonary shortness of breath, former smoker,    breath sounds clear to auscultation       Cardiovascular hypertension, Pt. on medications (-) angina+ CAD, + Past MI, + Cardiac Stents and + Peripheral Vascular Disease   Rhythm:Regular Rate:Normal     Neuro/Psych negative psych ROS   GI/Hepatic Neg liver ROS, hiatal hernia, GERD  Medicated and Controlled,  Endo/Other  negative endocrine ROS  Renal/GU Renal InsufficiencyRenal disease     Musculoskeletal  (+) Arthritis ,   Abdominal   Peds  Hematology  (+) anemia ,   Anesthesia Other Findings No nausea or vomiting after anesthetic today (01/06/16).  Reproductive/Obstetrics                             Anesthesia Physical Anesthesia Plan  ASA: III  Anesthesia Plan: General   Post-op Pain Management:    Induction: Intravenous  Airway Management Planned: LMA and Oral ETT  Additional Equipment: None  Intra-op Plan:   Post-operative Plan: Extubation in OR  Informed Consent: I have reviewed the patients History and Physical, chart, labs and discussed the procedure including the risks, benefits and alternatives for the proposed anesthesia with the patient or authorized representative who has indicated his/her understanding and acceptance.   Dental advisory given  Plan Discussed with: CRNA and Surgeon  Anesthesia Plan Comments:         Anesthesia Quick Evaluation

## 2016-01-07 NOTE — Progress Notes (Signed)
O2 sats 86-88% on 4L [place]. Strong, dry cough effort. Put on 7L simple mask until more awake. Will cont to monitor closely.

## 2016-01-07 NOTE — Interval H&P Note (Signed)
History and Physical Interval Note:  01/07/2016 1:08 PM  Raymond Jacobson  has presented today for surgery, with the diagnosis of LEFT ISCHEMIA LEG  The various methods of treatment have been discussed with the patient and family. After consideration of risks, benefits and other options for treatment, the patient has consented to  Procedure(s): AMPUTATION ABOVE KNEE (Left) as a surgical intervention .  The patient's history has been reviewed, patient examined, no change in status, stable for surgery.  I have reviewed the patient's chart and labs.  Questions were answered to the patient's satisfaction.     Ruta Hinds

## 2016-01-07 NOTE — Transfer of Care (Signed)
Immediate Anesthesia Transfer of Care Note  Patient: [name]  Procedure(s) Performed: Procedure(s): AMPUTATION LEFT LEG  ABOVE KNEE (Left)  Patient Location: PACU  Anesthesia Type:General  Level of Consciousness: awake  Airway & Oxygen Therapy: Patient Spontanous Breathing  Post-op Assessment: Report given to RN and Post -op Vital signs reviewed and stable  Post vital signs: Reviewed and stable  Last Vitals:  Vitals:   01/07/16 1700 01/07/16 1727  BP:    Pulse: 88 86  Resp: 18 20  Temp:  36.8 C    Last Pain:  Vitals:   01/07/16 1727  TempSrc:   PainSc: 4       Patients Stated Pain Goal: 2 ([id number])  Complications: No apparent anesthesia complications

## 2016-01-07 NOTE — Progress Notes (Signed)
Inpatient Diabetes Program Recommendations  AACE/ADA: New Consensus Statement on Inpatient Glycemic Control (2015)  Target Ranges:  Prepandial:   less than 140 mg/dL      Peak postprandial:   less than 180 mg/dL (1-2 hours)      Critically ill patients:  140 - 180 mg/dL  Results for Raymond Jacobson, Raymond Jacobson (MRN [id number]) as of 01/07/2016 08:42  Ref. Range 01/06/2016 02:10 01/06/2016 13:36 01/07/2016 00:40  Glucose Latest Ref Range: 65 - 99 mg/dL 141 (H) 154 (H) 220 (H)   Results for Raymond Jacobson, Raymond Jacobson (MRN [id number]) as of 01/07/2016 08:42  Ref. Range 06/20/2013 16:16  Hemoglobin A1C Latest Ref Range: <5.7 % 6.3 (H)   Review of Glycemic Control  Diabetes history: No Outpatient Diabetes medications: NA Current orders for Inpatient glycemic control: None  Inpatient Diabetes Program Recommendations: Correction (SSI): Glucose 141 mg/dl at 2:10 am on 01/06/16. Noted patient received one time dose of Decadron 10 mg on 01/06/16 at 14:45 which is likely cause of hyperglycemia this morning as fasting lab glucose 220 mg/dl.  Please consider ordering CBGs with Novolog correction scale if needed. HgbA1C: Last A1C in the chart 6.3% on 06/20/2013. Please consider ordering an A1C to evaluate glycemic control over the past 2-3 months.  Thanks, Barnie Alderman, RN, MSN, CDE Diabetes Coordinator Inpatient Diabetes Program [phone] (Team Pager from [address] to [place]) [phone] ([facility] office) [phone] [facility] office) [phone] [facility] office)

## 2016-01-07 NOTE — Progress Notes (Signed)
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 later today Dilaudid PCA for now for pain control  Ruta Hinds 01/07/2016 7:48 AM --  Laboratory Lab Results:  Recent Labs  01/05/16 0218 01/06/16 0210 01/06/16 1336  WBC 7.0 6.7  --   HGB 9.6* 9.5* 9.9*  HCT 28.7* 28.0* 29.0*  PLT 167 201  --    BMET  Recent Labs  01/06/16 0210 01/06/16 1336 01/07/16 0040  NA 134* 138 133*  K 2.6* 2.8* 3.2*  CL 98*  --  101  CO2 27  --  23  GLUCOSE 141* 154* 220*  BUN 21*  --  18  CREATININE 1.50*  --  1.12  CALCIUM 8.2*  --  7.9*    COAG Lab Results  Component Value Date   INR 1.00 01/01/2016   INR 1.72 (H) 12/24/2015   INR 1.06 10/01/2015   No results found for: PTT

## 2016-01-08 ENCOUNTER — Inpatient Hospital Stay (HOSPITAL_COMMUNITY): Payer: No Typology Code available for payment source

## 2016-01-08 ENCOUNTER — Encounter (HOSPITAL_COMMUNITY): Payer: Self-pay | Admitting: Vascular Surgery

## 2016-01-08 DIAGNOSIS — R112 Nausea with vomiting, unspecified: Secondary | ICD-10-CM

## 2016-01-08 DIAGNOSIS — J189 Pneumonia, unspecified organism: Secondary | ICD-10-CM

## 2016-01-08 LAB — CBC
HCT: 29.5 % — ABNORMAL LOW (ref 39.0–52.0)
HCT: 31.3 % — ABNORMAL LOW (ref 39.0–52.0)
HEMATOCRIT: 29.5 % — AB (ref 39.0–52.0)
HEMOGLOBIN: 9.9 g/dL — AB (ref 13.0–17.0)
Hemoglobin: 10 g/dL — ABNORMAL LOW (ref 13.0–17.0)
Hemoglobin: 10.3 g/dL — ABNORMAL LOW (ref 13.0–17.0)
MCH: 29.7 pg (ref 26.0–34.0)
MCH: 29.8 pg (ref 26.0–34.0)
MCH: 29 pg (ref 26.0–34.0)
MCHC: 33.6 g/dL (ref 30.0–36.0)
MCHC: 33.9 g/dL (ref 30.0–36.0)
MCHC: 32.9 g/dL (ref 30.0–36.0)
MCV: 88.6 fL (ref 78.0–100.0)
MCV: 87.8 fL (ref 78.0–100.0)
MCV: 88.2 fL (ref 78.0–100.0)
PLATELETS: 184 10*3/uL (ref 150–400)
Platelets: 196 10*3/uL (ref 150–400)
Platelets: 164 10*3/uL (ref 150–400)
RBC: 3.33 MIL/uL — AB (ref 4.22–5.81)
RBC: 3.36 MIL/uL — AB (ref 4.22–5.81)
RBC: 3.55 MIL/uL — ABNORMAL LOW (ref 4.22–5.81)
RDW: 14.9 % (ref 11.5–15.5)
RDW: 14.8 % (ref 11.5–15.5)
RDW: 14.6 % (ref 11.5–15.5)
WBC: 10.1 10*3/uL (ref 4.0–10.5)
WBC: 9.9 10*3/uL (ref 4.0–10.5)
WBC: 10.3 10*3/uL (ref 4.0–10.5)

## 2016-01-08 LAB — URINALYSIS, ROUTINE W REFLEX MICROSCOPIC
Glucose, UA: NEGATIVE mg/dL
Ketones, ur: 15 mg/dL — AB
NITRITE: NEGATIVE
Protein, ur: NEGATIVE mg/dL
SPECIFIC GRAVITY, URINE: 1.02 (ref 1.005–1.030)
pH: 5.5 (ref 5.0–8.0)

## 2016-01-08 LAB — BASIC METABOLIC PANEL
ANION GAP: 12 (ref 5–15)
Anion gap: 11 (ref 5–15)
BUN: 14 mg/dL (ref 6–20)
BUN: 15 mg/dL (ref 6–20)
CALCIUM: 8.4 mg/dL — AB (ref 8.9–10.3)
CHLORIDE: 98 mmol/L — AB (ref 101–111)
CHLORIDE: 100 mmol/L — AB (ref 101–111)
CO2: 29 mmol/L (ref 22–32)
CO2: 25 mmol/L (ref 22–32)
CREATININE: 1.14 mg/dL (ref 0.61–1.24)
Calcium: 8.6 mg/dL — ABNORMAL LOW (ref 8.9–10.3)
Creatinine, Ser: 1.08 mg/dL (ref 0.61–1.24)
GFR calc non Af Amer: >60 mL/min (ref >60)
Glucose, Bld: 124 mg/dL — ABNORMAL HIGH (ref 65–99)
Glucose, Bld: 166 mg/dL — ABNORMAL HIGH (ref 65–99)
POTASSIUM: 3.1 mmol/L — AB (ref 3.5–5.1)
Potassium: 3.2 mmol/L — ABNORMAL LOW (ref 3.5–5.1)
SODIUM: 136 mmol/L (ref 135–145)
Sodium: 139 mmol/L (ref 135–145)

## 2016-01-08 LAB — BLOOD GAS, ARTERIAL
Acid-Base Excess: 2.5 mmol/L — ABNORMAL HIGH (ref 0.0–2.0)
BICARBONATE: 26.1 meq/L — AB (ref 20.0–24.0)
DRAWN BY: 31101
O2 SAT: 97.3 %
PATIENT TEMPERATURE: 98.6
TCO2: 27.2 mmol/L (ref 0–100)
pCO2 arterial: 37.1 mmHg (ref 35.0–45.0)
pH, Arterial: 7.461 — ABNORMAL HIGH (ref 7.350–7.450)
pO2, Arterial: 88 mmHg (ref 80.0–100.0)

## 2016-01-08 LAB — HEPARIN INDUCED PLATELET AB (HIT ANTIBODY): Heparin Induced Plt Ab: 0.27 OD (ref 0.000–0.400)

## 2016-01-08 LAB — CK TOTAL AND CKMB (NOT AT ARMC)
CK TOTAL: 341 U/L (ref 49–397)
CK, MB: 5.1 ng/mL — AB (ref 0.5–5.0)
RELATIVE INDEX: 1.5 (ref 0.0–2.5)

## 2016-01-08 LAB — URINE MICROSCOPIC-ADD ON

## 2016-01-08 LAB — HEPARIN LEVEL (UNFRACTIONATED): HEPARIN UNFRACTIONATED: 0.42 [IU]/mL (ref 0.30–0.70)

## 2016-01-08 LAB — TROPONIN I

## 2016-01-08 MED ORDER — POTASSIUM CHLORIDE 10 MEQ/100ML IV SOLN
10.0000 meq | INTRAVENOUS | Status: AC
Start: 2016-01-08 — End: 2016-01-09
  Administered 2016-01-08 – 2016-01-09 (×4): 10 meq via INTRAVENOUS
  Filled 2016-01-08 (×4): qty 100

## 2016-01-08 MED ORDER — FENTANYL CITRATE (PF) 100 MCG/2ML IJ SOLN
INTRAMUSCULAR | Status: AC
  Administered 2016-01-08: 50 ug
  Filled 2016-01-08: qty 2

## 2016-01-08 MED ORDER — LIDOCAINE HCL (PF) 1 % IJ SOLN
INTRAMUSCULAR | Status: AC
  Administered 2016-01-08: 30 mL
  Filled 2016-01-08: qty 30

## 2016-01-08 MED ORDER — FUROSEMIDE 10 MG/ML IJ SOLN
40.0000 mg | Freq: Once | INTRAMUSCULAR | Status: AC
  Administered 2016-01-08: 40 mg via INTRAVENOUS
  Filled 2016-01-08: qty 4

## 2016-01-08 MED ORDER — POTASSIUM CHLORIDE CRYS ER 20 MEQ PO TBCR: 40.0000 meq | EXTENDED_RELEASE_TABLET | Freq: Once | ORAL | Status: DC

## 2016-01-08 MED ORDER — MIDAZOLAM HCL 2 MG/2ML IJ SOLN
INTRAMUSCULAR | Status: AC
  Administered 2016-01-08: 2 mg
  Filled 2016-01-08: qty 2

## 2016-01-08 MED ORDER — PIPERACILLIN-TAZOBACTAM 3.375 G IVPB
3.3750 g | Freq: Three times a day (TID) | INTRAVENOUS | Status: DC
  Administered 2016-01-08 – 2016-01-10 (×5): 3.375 g via INTRAVENOUS
  Filled 2016-01-08 (×7): qty 50

## 2016-01-08 MED ORDER — POTASSIUM CHLORIDE 10 MEQ/100ML IV SOLN
10.0000 meq | INTRAVENOUS | Status: AC
  Administered 2016-01-08 (×6): 10 meq via INTRAVENOUS
  Filled 2016-01-08 (×7): qty 100

## 2016-01-08 MED ORDER — VANCOMYCIN HCL 10 G IV SOLR
1500.0000 mg | Freq: Once | INTRAVENOUS | Status: AC
  Administered 2016-01-08: 1500 mg via INTRAVENOUS
  Filled 2016-01-08: qty 1500

## 2016-01-08 MED ORDER — HEPARIN (PORCINE) IN NACL 100-0.45 UNIT/ML-% IJ SOLN
2800.0000 [IU]/h | INTRAMUSCULAR | Status: DC
  Administered 2016-01-08 – 2016-01-09 (×2): 1450 [IU]/h via INTRAVENOUS
  Administered 2016-01-10: 1600 [IU]/h via INTRAVENOUS
  Administered 2016-01-11: 2400 [IU]/h via INTRAVENOUS
  Administered 2016-01-11: 2200 [IU]/h via INTRAVENOUS
  Administered 2016-01-12 (×2): 2800 [IU]/h via INTRAVENOUS
  Administered 2016-01-12: 2600 [IU]/h via INTRAVENOUS
  Filled 2016-01-08 (×10): qty 250

## 2016-01-08 MED ORDER — VANCOMYCIN HCL IN DEXTROSE 1-5 GM/200ML-% IV SOLN
1000.0000 mg | Freq: Two times a day (BID) | INTRAVENOUS | Status: DC
  Administered 2016-01-09 – 2016-01-10 (×3): 1000 mg via INTRAVENOUS
  Filled 2016-01-08 (×4): qty 200

## 2016-01-08 MED ORDER — NITROGLYCERIN 2 % TD OINT
0.5000 [in_us] | TOPICAL_OINTMENT | Freq: Four times a day (QID) | TRANSDERMAL | Status: DC
  Administered 2016-01-09 – 2016-01-10 (×8): 0.5 [in_us] via TOPICAL
  Filled 2016-01-08: qty 30

## 2016-01-08 MED ORDER — POTASSIUM CHLORIDE CRYS ER 20 MEQ PO TBCR: 20.0000 meq | EXTENDED_RELEASE_TABLET | Freq: Once | ORAL | Status: DC

## 2016-01-08 NOTE — Consult Note (Signed)
Trauma Service Note  Subjective: I was asked by the nurses on 3S to see this patient with the incidental finding of a large right pneumothorax on and abdominal X-ray that was done for an ileus.  CXR confirms the presence of a large PTX on the right.  It appears to be under tension, but not causing clinically any tracheal deviation or hemodynamic instability.  Objective: Vital signs in last 24 hours: Temp:  [97.3 F (36.3 C)-98.5 F (36.9 C)] 98.5 F (36.9 C) (08/09 0746) Pulse Rate:  [75-98] 90 (08/09 0746) Resp:  [9-22] 15 (08/09 0746) BP: (108-143)/(56-80) 128/70 (08/09 0746) SpO2:  [90 %-99 %] 91 % (08/09 0746) Last BM Date: 01/06/16  Intake/Output from previous day: 08/08 0701 - 08/09 0700 In: 1709.9 [P.O.:100; I.V.:1519.9] Out: 1650 [Urine:1600; Blood:50] Intake/Output this shift: Total I/O In: -  Out: 250 [Urine:250]  General: Mild distress  Lungs: Markedly decreased breath sounds on the right.  Abd: distended, hypoactive bowel sounds, tympanic  Extremities: No changes.Left AKA  Neuro: Intact  Lab Results: CBC   Recent Labs  01/07/16 1852 01/08/16 0451  WBC 11.5* 10.1  HGB 8.9* 9.9*  HCT 25.9* 29.5*  PLT 198 196   BMET  Recent Labs  01/07/16 0040 01/07/16 1852 01/08/16 0451  NA 133*  --  139  K 3.2*  --  3.1*  CL 101  --  98*  CO2 23  --  29  GLUCOSE 220*  --  124*  BUN 18  --  14  CREATININE 1.12 1.06 1.08  CALCIUM 7.9*  --  8.6*   PT/INR No results for input(s): LABPROT, INR in the last 72 hours. ABG No results for input(s): PHART, HCO3 in the last 72 hours.  Invalid input(s): PCO2, PO2  Studies/Results: Dg Abd 1 View  Result Date: 01/08/2016 CLINICAL DATA:  Abdominal pain EXAM: ABDOMEN - 1 VIEW COMPARISON:  None. FINDINGS: Limited visualization of the lower thorax demonstrates a a suspected at least moderate size right-sided pneumothorax, incompletely imaged. There is marked gas distention of the stomach as well as mild gaseous  distention of multiple loops of large and small bowel with index loop of small bowel measuring approximately 3.4 cm in diameter. No definitive supine evidence of pneumoperitoneum. No definite pneumatosis or portal venous gas. No acute osseus abnormalities. IMPRESSION: 1. Suspected at least moderate sized right-sided pneumothorax, incompletely imaged. Further evaluation dedicated chest radiograph is recommended. 2. Gaseous distention of the stomach, small bowel and colon, nonspecific though suggestive of ileus. Critical Value/emergent results were called by telephone at the time of interpretation on 01/08/2016 at 9:11 am to [facility], [place], as well the patient's nurse, Everlena Cooper, RN, who verbally acknowledged these results. Electronically Signed   By: Sandi Mariscal M.D.   On: 01/08/2016 09:39   Dg Ang/ext/uni/or Left  Result Date: 01/07/2016 CLINICAL DATA:  Door MacNamee and revision of left femoral to popliteal bypass graft. EXAM: LEFT ANG/EXT/UNI/ OR COMPARISON:  01/01/2016 FINDINGS: Intraoperative image demonstrates patent distal bypass graft with anastomosis to the popliteal artery above the knee. The popliteal artery shows luminal irregularity without occlusion. Runoff is visualized via the anterior tibial artery. The peroneal and posterior tibial arteries appear to be occluded proximally. IMPRESSION: Patent distal bypass graft with anastomosis to the popliteal artery above the knee. Anterior tibial runoff is visualized. Electronically Signed   By: Aletta Edouard M.D.   On: 01/07/2016 17:11    Anti-infectives: Anti-infectives    Start     Dose/Rate Route Frequency  Ordered Stop   01/07/16 1800  cefUROXime (ZINACEF) 1.5 g in dextrose 5 % 50 mL IVPB     1.5 g 100 mL/hr over 30 Minutes Intravenous Every 12 hours 01/07/16 1743 01/08/16 1759   01/07/16 1430  cefUROXime (ZINACEF) 1.5 g in dextrose 5 % 50 mL IVPB     1.5 g 100 mL/hr over 30 Minutes Intravenous  Once 01/07/16 1425 01/07/16 1409   01/07/16 1350   dextrose 5 % with cefUROXime (ZINACEF) ADS Med    Comments:  Kerrie Pleasure   : cabinet override      01/07/16 1350 01/08/16 0159   01/06/16 2151  dextrose 5 % with cefUROXime (ZINACEF) ADS Med    Comments:  Haynes Bast   : cabinet override      01/06/16 2151 01/06/16 2200   01/06/16 1824  dextrose 5 % with cefUROXime (ZINACEF) ADS Med    Comments:  Trixie Deis   : cabinet override      01/06/16 1824 01/07/16 0629   01/06/16 0600  cefUROXime (ZINACEF) 1.5 g in dextrose 5 % 50 mL IVPB     1.5 g 100 mL/hr over 30 Minutes Intravenous On call to O.R. 01/05/16 1035 01/06/16 1430   01/01/16 2200  cefUROXime (ZINACEF) 1.5 g in dextrose 5 % 50 mL IVPB     1.5 g 100 mL/hr over 30 Minutes Intravenous Every 12 hours 01/01/16 2154 01/02/16 1030   01/01/16 1736  dextrose 5 % with cefUROXime (ZINACEF) ADS Med    Comments:  Gershon Crane   : cabinet override      01/01/16 1736 01/02/16 0544   01/01/16 0552  cefUROXime (ZINACEF) 1.5 g in dextrose 5 % 50 mL IVPB     1.5 g 100 mL/hr over 30 Minutes Intravenous 30 min pre-op 01/01/16 0552 01/01/16 0902      Assessment/Plan: s/p Procedure(s): AMPUTATION LEFT LEG  ABOVE KNEE Right CT after NGT placed.  LOS: 7 days   Kathryne Eriksson. Dahlia Bailiff, MD, FACS [phone] Trauma Surgeon 01/08/2016

## 2016-01-08 NOTE — Progress Notes (Addendum)
Pt with sats 82% on 3.5L O2., pt placed in Non-Rx, rapid response called, Md/Brabham called, CCM consulted, CCM/PA at [facility] pt tx 2h per MD order, pt tx to [place], report given at [facility], all questions answered

## 2016-01-08 NOTE — Progress Notes (Signed)
Call received per floor RN at 1927 regarding Pt with acute onset SOB and increased 02 need, S/P chest tube placement earlier today for pneumothorax. Upon my arrival Pt found resting in bed, 02 sats 96-98 on NRB RR 17-22, Lung sounds diminished but moving air. Chest tube to 20 cm wall suction, some new shadowing at CT dressing site. Pt denies SOB at this time and does not appear to be in acute distress. Lethargic, oriented x 4 and denies pain. P. Heber [place] PCCM NP at bedside with [place] PCCM P.A for night shift. Per NP Pt to transfer to unit for closer monitoring. ABG completed and Pt transferred to [place]. Met by Gus Height and Xray tech once in ICU. Report provided to ICU RN per Ulyess Mort SDU RN.

## 2016-01-08 NOTE — Consult Note (Signed)
Name: Raymond Jacobson MRN: [id number] DOB: 05/22/1941    ADMISSION DATE:  01/01/2016 CONSULTATION DATE:  01/08/16  REFERRING MD :  Oneida Alar  CHIEF COMPLAINT:  SOB   HISTORY OF PRESENT ILLNESS:  Raymond Jacobson is a 75 y.o. male with a PMH as outlined below including significant PVD and claudication of the LLE.  He was admitted 8/2 for left femoral endarterectomy and left femoral to above knee popliteal bypass.  He unfortunately had problems with ischemia post op and was taken back to OR that afternoon for thrombectomy.  He then had recurrent ischemia again and on 8/7 was taken back to OR for redo.  On 8/8, after further ischemia, decision was made to proceed with AKA. On 8/9, pt had KUB to evaluate for ileus and had incidental finding of large right PTX with tension component.  A right sided chest tube was placed by general surgery with complete re-expansion of the right lung. Later that evening, pt had increase in dyspnea along with increase in O2 needs.  He was placed on NRB and was transferred to the ICU for further evaluation and management.   PAST MEDICAL HISTORY :   has a past medical history of Angina; Arthritis; Basal cell carcinoma of scalp; CAD (coronary artery disease); Complication of anesthesia; Cough; Dry cough; DVT (deep venous thrombosis) ([place]) (~ 1969); Elevated lipids; GERD (gastroesophageal reflux disease); High cholesterol; History of hiatal hernia; Hypertension; Myocardial infarction ([place]) (~ 2006); Peripheral vascular disease ([place]); PONV (postoperative nausea and vomiting); and Shortness of breath (04/13/11).  has a past surgical history that includes Tricep Surgery (~ 2007); Tonsillectomy; Finger amputation [id number]); left heart catheterization with coronary angiogram (N/A, 04/13/2011); percutaneous coronary stent intervention (pci-s) (Right, 04/13/2011); left heart catheterization with coronary angiogram (N/A, 06/20/2013); Carpal tunnel release (Right, 1990s); Coronary angioplasty with stent  (~ 2004; ~ 2006; 04/13/11); Femoral artery stent (Left, 10/15/2015); Cardiac catheterization (N/A, 10/15/2015); Femoral-popliteal Bypass Graft (Left, 01/01/2016); Intraoperative arteriogram (Left, 01/01/2016); Femoral-popliteal Bypass Graft (Left, 01/01/2016); Vein harvest (Left, 01/01/2016); Endarterectomy femoral (Left, 01/01/2016); Femoral-popliteal Bypass Graft (Left, 01/06/2016); Femoral-popliteal Bypass Graft (Left, 01/06/2016); and Amputation (Left, 01/07/2016). Prior to Admission medications   Medication Sig Start Date End Date Taking? Authorizing Provider  atorvastatin (LIPITOR) 80 MG tablet Take 1 tablet (80 mg total) by mouth every evening. Patient taking differently: Take 40 mg by mouth every evening.  06/22/13  Yes Rhonda G Barrett, PA-C  chlorthalidone (HYGROTON) 25 MG tablet Take 12.5 mg by mouth daily.   Yes Historical Provider, MD  dabigatran (PRADAXA) 150 MG CAPS capsule Take 150 mg by mouth 2 (two) times daily.   Yes Historical Provider, MD  diphenoxylate-atropine (LOMOTIL) 2.5-0.025 MG tablet Take 1 tablet by mouth daily as needed for diarrhea or loose stools.   Yes Historical Provider, MD  enalapril (VASOTEC) 20 MG tablet Take 1 tablet (20 mg total) by mouth 2 (two) times daily. 06/22/13  Yes Rhonda G Barrett, PA-C  isosorbide mononitrate (IMDUR) 30 MG 24 hr tablet Take 30 mg by mouth 2 (two) times daily.   Yes Historical Provider, MD  metoprolol (TOPROL-XL) 200 MG 24 hr tablet Take 100 mg by mouth daily.   Yes Historical Provider, MD  niacin (NIASPAN) 750 MG CR tablet Take 750 mg by mouth at bedtime.     Yes Historical Provider, MD  NIFEdipine (ADALAT CC) 90 MG 24 hr tablet Take 90 mg by mouth every evening.   Yes Historical Provider, MD  OVER THE COUNTER MEDICATION Take 1 capsule by mouth daily. "Super  Beta Prostate" supplement   Yes Historical Provider, MD  nitroGLYCERIN (NITROSTAT) 0.4 MG SL tablet Place 1 tablet (0.4 mg total) under the tongue every 5 (five) minutes as needed for chest pain.  06/22/13   Evelene Croon Barrett, PA-C   Allergies  Allergen Reactions  . No Known Allergies     FAMILY HISTORY:  family history includes Cancer in his father; Heart attack in his paternal grandfather. SOCIAL HISTORY:  reports that he quit smoking about 48 years ago. His smoking use included Cigarettes. He has a 12.00 pack-year smoking history. He has never used smokeless tobacco. He reports that he drinks alcohol. He reports that he does not use drugs.  REVIEW OF SYSTEMS:   All negative; except for those that are bolded, which indicate positives.  Constitutional: weight loss, weight gain, night sweats, fevers, chills, fatigue, weakness.  HEENT: headaches, sore throat, sneezing, nasal congestion, post nasal drip, difficulty swallowing, tooth/dental problems, visual complaints, visual changes, ear aches. Neuro: difficulty with speech, weakness, numbness, ataxia. CV:  chest pain, orthopnea, PND, swelling in lower extremities, dizziness, palpitations, syncope.  Resp: cough, hemoptysis, dyspnea (though improved from earlier), wheezing. GI: heartburn, indigestion, abdominal pain, nausea, vomiting, diarrhea, constipation, change in bowel habits, loss of appetite, hematemesis, melena, hematochezia.  GU: dysuria, change in color of urine, urgency or frequency, flank pain, hematuria. MSK: joint pain or swelling, decreased range of motion. Psych: change in mood or affect, depression, anxiety, suicidal ideations, homicidal ideations. Skin: rash, itching, bruising.    SUBJECTIVE:  He states that his breathing has eased off compared to earlier.  Is still having occasional dyspnea however.  Denies chest pain.  VITAL SIGNS: Temp:  [98.3 F (36.8 C)-98.8 F (37.1 C)] 98.8 F (37.1 C) (08/09 1503) Pulse Rate:  [75-113] 113 (08/09 1503) Resp:  [11-18] 18 (08/09 1503) BP: (108-162)/(56-88) 162/88 (08/09 1503) SpO2:  [91 %-97 %] 97 % (08/09 1503)  PHYSICAL EXAMINATION: General: Caucasian male, resting  in bed, in NAD. Neuro: A&O x 3, non-focal.  HEENT: [place]/AT. PERRL, sclerae anicteric. NRB in place. Cardiovascular: RRR, no M/R/G.  Lungs: Respirations even and unlabored.  Faint bibasilar crackles.  R chest tube in place, no air leak. Abdomen: BS x 4, soft, NT/ND.  Musculoskeletal: No gross deformities, no edema.  Skin: Intact, warm, no rashes.    Recent Labs Lab 01/06/16 0210 01/06/16 1336 01/07/16 0040 01/07/16 1852 01/08/16 0451  NA 134* 138 133*  --  139  K 2.6* 2.8* 3.2*  --  3.1*  CL 98*  --  101  --  98*  CO2 27  --  23  --  29  BUN 21*  --  18  --  14  CREATININE 1.50*  --  1.12 1.06 1.08  GLUCOSE 141* 154* 220*  --  124*    Recent Labs Lab 01/07/16 1852 01/08/16 0451 01/08/16 1209  HGB 8.9* 9.9* 10.0*  HCT 25.9* 29.5* 29.5*  WBC 11.5* 10.1 9.9  PLT 198 196 164   Dg Abd 1 View  Result Date: 01/08/2016 CLINICAL DATA:  Abdominal pain EXAM: ABDOMEN - 1 VIEW COMPARISON:  None. FINDINGS: Limited visualization of the lower thorax demonstrates a a suspected at least moderate size right-sided pneumothorax, incompletely imaged. There is marked gas distention of the stomach as well as mild gaseous distention of multiple loops of large and small bowel with index loop of small bowel measuring approximately 3.4 cm in diameter. No definitive supine evidence of pneumoperitoneum. No definite pneumatosis or portal venous  gas. No acute osseus abnormalities. IMPRESSION: 1. Suspected at least moderate sized right-sided pneumothorax, incompletely imaged. Further evaluation dedicated chest radiograph is recommended. 2. Gaseous distention of the stomach, small bowel and colon, nonspecific though suggestive of ileus. Critical Value/emergent results were called by telephone at the time of interpretation on 01/08/2016 at 9:11 am to [facility], [place], as well the patient's nurse, Everlena Cooper, RN, who verbally acknowledged these results. Electronically Signed   By: Sandi Mariscal M.D.   On: 01/08/2016 09:39    Dg Chest Port 1 View  Result Date: 01/08/2016 CLINICAL DATA:  Status post right chest tube insertion for tension pneumothorax and nasogastric tube insertion for ileus. EXAM: PORTABLE CHEST 1 VIEW COMPARISON:  Film earlier today at 0958 hours FINDINGS: Large bore single right chest tube has been placed extending up towards the apex. There is complete re-expansion of the right lung with no further visualized pneumothorax. Nasogastric tube extends into the fundus of the stomach and the stomach is now decompressed with resolution of gastric distention. Visualized upper colon shows persistent evidence of ileus. Mediastinum is now in a normal position with resolution of tension mass-effect. No pulmonary edema or pleural fluid identified. IMPRESSION: 1. Complete re-expansion of the right lung after chest tube placement with no further pneumothorax visualized and resolution of tension on the mediastinum. 2. Decompression of stomach after nasogastric tube placement. Colonic ileus remains visible. Electronically Signed   By: Aletta Edouard M.D.   On: 01/08/2016 11:33   Dg Chest Port 1 View  Result Date: 01/08/2016 CLINICAL DATA:  Right pneumothorax and shortness of breath. EXAM: PORTABLE CHEST 1 VIEW COMPARISON:  Abdominal film earlier today. FINDINGS: Large right pneumothorax present with essential collapse of nearly the entire right lung. There likely is a tension component with visible shift of the mediastinum to the left. Left lung shows no significant abnormalities. There is significant gaseous distention of the stomach. IMPRESSION: Large right pneumothorax with tension component. Gaseous distention of the stomach, as seen on the abdominal film. These results will called by telephone to Dr. Judeth Horn . Electronically Signed   By: Aletta Edouard M.D.   On: 01/08/2016 10:36   Dg Ang/ext/uni/or Left  Result Date: 01/07/2016 CLINICAL DATA:  Door MacNamee and revision of left femoral to popliteal bypass graft.  EXAM: LEFT ANG/EXT/UNI/ OR COMPARISON:  01/01/2016 FINDINGS: Intraoperative image demonstrates patent distal bypass graft with anastomosis to the popliteal artery above the knee. The popliteal artery shows luminal irregularity without occlusion. Runoff is visualized via the anterior tibial artery. The peroneal and posterior tibial arteries appear to be occluded proximally. IMPRESSION: Patent distal bypass graft with anastomosis to the popliteal artery above the knee. Anterior tibial runoff is visualized. Electronically Signed   By: Aletta Edouard M.D.   On: 01/07/2016 17:11    STUDIES:  CXR 8/9 > large right PTX with tension component. CXR 8/9 > increasing opacities at the RLL and RML.  SIGNIFICANT EVENTS  8/2 > left femoral endarterectomy, left femoral to above knee popliteal bypass. 8/2 > back to OR for thrombectomy of left femoropopliteal bypass, intraoperative arteriogram. 8/7 > back to OR for redo of left femoral to above knee popliteal bypass. 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  Later that evening had increase in dyspnea and higher O2 requirements so transferred to ICU.   ASSESSMENT / PLAN:  Acute hypoxic respiratory failure. Mild pulmonary edema. Plan: Continue  supplemental O2 as needed to maintain SpO2 > 92%. 40mg  lasix x 1. Pulmonary hygiene. CXR in AM.  Large right PTX with tension component - s/p chest tube placement 8/9 by general surgery. Plan: Continue chest tube management per CCS.  PVD - s/p left AKA. Plan: Post op care per vascular.  Ileus. Plan: Continue NGT.  Rest per primary team.   Montey Hora, [place] [facility] Pager: [phone]  or [phone] 01/08/2016, 8:31 PM

## 2016-01-08 NOTE — Progress Notes (Signed)
Rehab Admissions Coordinator Note:  Patient was screened by Retta Diones for appropriateness for an Inpatient Acute Rehab Consult.  At this time, an inpatient rehab consult has been ordered and is pending.  I will follow up once consult is completed.  Retta Diones 01/08/2016, 6:50 PM  I can be reached at [phone].

## 2016-01-08 NOTE — Progress Notes (Signed)
ANTICOAGULATION CONSULT NOTE  Pharmacy Consult for Heparin Indication: atrial fibrillation + occluded L leg bypass  s/p AKA 8/8  Allergies  Allergen Reactions  . No Known Allergies     Patient Measurements: Height: 5\' 8"  (172.7 cm) Weight: 194 lb (88 kg) IBW/kg (Calculated) : 68.4 Heparin Dosing Weight: 86.5 kg  Vital Signs: Temp: 97.7 F (36.5 C) (08/09 2030) Temp Source: Oral (08/09 2030) BP: 135/73 (08/09 2030) Pulse Rate: 108 (08/09 2030)  Labs:  Recent Labs  01/06/16 0210  01/07/16 0040 01/07/16 0451 01/07/16 1852 01/08/16 0451 01/08/16 1209 01/08/16 2050  HGB 9.5*  < >  --   --  8.9* 9.9* 10.0* 10.3*  HCT 28.0*  < >  --   --  25.9* 29.5* 29.5* 31.3*  PLT 201  --   --   --  198 196 164 184  APTT  --   --  87*  --   --   --   --   --   HEPARINUNFRC 0.42  --   --  <0.10*  --   --   --  0.42  CREATININE 1.50*  --  1.12  --  1.06 1.08  --   --   < > = values in this interval not displayed.  Estimated Creatinine Clearance: 63.7 mL/min (by C-G formula based on SCr of 1.08 mg/dL).   Assessment: 75 yo male with h/o AFib, Pradaxa PTA on hold.  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  Pharmacy consulted to resume heparin for PMH of afib.    Heparin level is therapeutic 0.42 on 1450 units/hr.  H/H stable, platelets 184. No overt bleeding noted. S/P chest tube placed for spontaneous pneumothorax.   Goal of Therapy:  Heparin level 0.3-0.7 units/ml Monitor platelets by anticoagulation protocol: Yes   Plan:  Continue heparin at 1450 units/hr.  Follow-up daily HL and CBC in AM.   Sloan Leiter, PharmD, BCPS Clinical Pharmacist [phone] 01/08/2016 9:23 PM

## 2016-01-08 NOTE — Anesthesia Postprocedure Evaluation (Signed)
Anesthesia Post Note  Patient: Raymond Jacobson  Procedure(s) Performed: Procedure(s) (LRB): AMPUTATION LEFT LEG  ABOVE KNEE (Left)  Patient location during evaluation: PACU Anesthesia Type: General Level of consciousness: awake Pain management: pain level controlled Vital Signs Assessment: post-procedure vital signs reviewed and stable Respiratory status: spontaneous breathing Cardiovascular status: stable Postop Assessment: no signs of nausea or vomiting Anesthetic complications: no    Last Vitals:  Vitals:   01/08/16 0746 01/08/16 1117  BP: 128/70 126/71  Pulse: 90 97  Resp: 15 14  Temp: 36.9 C 37.1 C    Last Pain:  Vitals:   01/08/16 1117  TempSrc: Oral  PainSc:                  Raymond Jacobson

## 2016-01-08 NOTE — Progress Notes (Addendum)
ANTICOAGULATION CONSULT NOTE  Pharmacy Consult for Heparin Indication: atrial fibrillation + occluded L leg bypass  s/p AKA 8/8  Allergies  Allergen Reactions  . No Known Allergies     Patient Measurements: Height: 5\' 8"  (172.7 cm) Weight: 194 lb (88 kg) IBW/kg (Calculated) : 68.4 Heparin Dosing Weight: 86.5 kg  Vital Signs: Temp: 98.5 F (36.9 C) (08/09 0746) Temp Source: Oral (08/09 0746) BP: 128/70 (08/09 0746) Pulse Rate: 90 (08/09 0746)  Labs:  Recent Labs  01/05/16 1149  01/06/16 0210 01/06/16 1336 01/07/16 0040 01/07/16 0451 01/07/16 1852 01/08/16 0451  HGB  --   < > 9.5* 9.9*  --   --  8.9* 9.9*  HCT  --   < > 28.0* 29.0*  --   --  25.9* 29.5*  PLT  --   --  201  --   --   --  198 196  APTT  --   --   --   --  87*  --   --   --   HEPARINUNFRC 0.40  --  0.42  --   --  <0.10*  --   --   CREATININE  --   < > 1.50*  --  1.12  --  1.06 1.08  < > = values in this interval not displayed.  Estimated Creatinine Clearance: 63.7 mL/min (by C-G formula based on SCr of 1.08 mg/dL).   Assessment: 75 yo male with h/o AFib, Pradaxa PTA on hold.  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  Pharmacy consulted to resume heparin for PMH of afib.  He was previously therapeutic on 1450 unit/shr. S/p chest tube today.  CBC stable, no bleeding reported.   Goal of Therapy:  Heparin level 0.3-0.7 units/ml Monitor platelets by anticoagulation protocol: Yes   Plan:  -resume heparin with no bolus at 1450 units/hr and check 8 hr HL -Daily HL, CBC - f/u when safe to resume home pradaxa- currently NPO w/ ileus  Eudelia Bunch, Pharm.D. [id number] 01/08/2016 11:17 AM

## 2016-01-08 NOTE — Progress Notes (Signed)
[place] Progress Note Patient Name: Raymond Jacobson DOB: 1941-05-13 MRN: [id number]   Date of Service  01/08/2016  HPI/Events of Note  K+ = 3.2 and Creatinine = 1.14.  eICU Interventions  Will replace K+.      Intervention Category Intermediate Interventions: Electrolyte abnormality - evaluation and management  Ellyana Crigler [place] 01/08/2016, 11:01 PM

## 2016-01-08 NOTE — Progress Notes (Addendum)
  Progress Note    01/08/2016 8:21 AM 1 Day Post-Op  Subjective:  C/o abdomen hurting and swelling of his penis  Afebrile HR  70's-90's NSR [id number] systolic [id number]  Vitals:   01/08/16 0400 01/08/16 0746  BP:  128/70  Pulse:  90  Resp: 14 15  Temp:  98.5 F (36.9 C)    Physical Exam: Incisions:  Left AKA bandage is clean and dry Abdomen:  Firm; diffuse tenderness to palpation; +BS; last BM 1-2 days ago (pt states yesterday--documented 8/7)  CBC    Component Value Date/Time   WBC 10.1 01/08/2016 0451   RBC 3.33 (L) 01/08/2016 0451   HGB 9.9 (L) 01/08/2016 0451   HCT 29.5 (L) 01/08/2016 0451   PLT 196 01/08/2016 0451   MCV 88.6 01/08/2016 0451   MCH 29.7 01/08/2016 0451   MCHC 33.6 01/08/2016 0451   RDW 14.9 01/08/2016 0451   LYMPHSABS 1.2 10/01/2015 0901   MONOABS 0.4 10/01/2015 0901   EOSABS 0.1 10/01/2015 0901   BASOSABS 0.1 10/01/2015 0901    BMET    Component Value Date/Time   NA 139 01/08/2016 0451   K 3.1 (L) 01/08/2016 0451   CL 98 (L) 01/08/2016 0451   CO2 29 01/08/2016 0451   GLUCOSE 124 (H) 01/08/2016 0451   BUN 14 01/08/2016 0451   CREATININE 1.08 01/08/2016 0451   CALCIUM 8.6 (L) 01/08/2016 0451   GFRNONAA >60 01/08/2016 0451   GFRAA >60 01/08/2016 0451    INR    Component Value Date/Time   INR 1.00 01/01/2016 0705     Intake/Output Summary (Last 24 hours) at 01/08/16 0821 Last data filed at 01/08/16 0749  Gross per 24 hour  Intake             1333 ml  Output             1350 ml  Net              -17 ml     Assessment:  75 y.o. male is s/p:  Left above knee amputation 1 Day Post-Op  Plan: -pt's bandage on left AKA is clean and dry -abdomen is firm with diffuse tenderness; denies nausea-on PCA-Will order dulcolax supp-if no relief or BM, will get KUB -DVT prophylaxis:  Heparin SQ   Leontine Locket, PA-C [facility] [phone] 01/08/2016 8:21 AM   History and exam as above.  Pt noted to have  large right pneumothorax on chest xray.  Currently requiring non rebreather.  Dr Hulen Skains from [facility] at bedside and will place chest tube.  Family updated.  Pt also had NG tube placed for ileus.  He was confused last night will d/c Dilaudid PCA for now.  Ruta Hinds, MD [facility] [place] Office: [phone] Pager: [phone]

## 2016-01-08 NOTE — Evaluation (Signed)
Physical Therapy Evaluation Patient Details Name: Raymond Jacobson MRN: [id number] DOB: January 19, 1941 Today's Date: 01/08/2016   History of Present Illness  75 y.o. male admitted to [facility] on 01/01/16 for L leg claudication symptoms.  He underwent L femoral endarterectomy and left femoral to above knee popliteal bypass on 01/01/16.  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  NGT placed due to post op ileus and incidental finding of R PTX, so R CT placed on 01/08/16.  Pt with significant PMHx of SOB, PVD, MI, HTN, DVT, and CAD.  Clinical Impression  Pt was able to sit EOB for quite a while with PT today.  Overall, he was mod assist to get to sitting and once stable, could be supervision with bil arm prop for sitting balance.  He would benefit from intensive inpatient rehab before returning home.   PT to follow acutely for deficits listed below.       Follow Up Recommendations CIR    Equipment Recommendations  Rolling walker with 5" wheels;3in1 (PT);Wheelchair (measurements PT);Wheelchair cushion (measurements PT);Other (comment) (drop arm 3-in-1)    Recommendations for Other Services Rehab consult     Precautions / Restrictions Precautions Precautions: Fall      Mobility  Bed Mobility Overal bed mobility: Needs Assistance Bed Mobility: Supine to Sit;Sit to Supine     Supine to sit: HOB elevated;+2 for physical assistance;Mod assist Sit to supine: +2 for physical assistance;Mod assist   General bed mobility comments: HOB maximally elevated to help pt get to EOB.  Pt using trapeeze, but cued to use bedrail for leverage instead.  Pt also needing hand held assist to pull up to sitting EOB.  Pt initially with posterior lean and LOB when coming to sitting.  Assist needed of both trunk and lower extremities when going back to supine in bed and pt did so with uncontrolled descent of his trunk (essentially thowing his trunk back into bed).    Transfers                 General transfer comment: NT- pt would require two person assist if standing attempted.  He would likely be a one person scoot pivot into a WC or drop arm recliner chair.          Balance Overall balance assessment: Needs assistance Sitting-balance support: Feet supported;Bilateral upper extremity supported Sitting balance-Leahy Scale: Poor Sitting balance - Comments: Initially up to mod assist to prevent posterior LOB, however, once he got his hands positioned on the bed and scooted out so that his right foot would touch, he was able to be supervision with bil hands proped for support.  Postural control: Posterior lean                                   Pertinent Vitals/Pain Pain Assessment: 0-10 Pain Score: 4  (4/10 R chest tube site, 6/10 left residual limb) Pain Location: right CT site, L residual limb Pain Descriptors / Indicators: Grimacing;Guarding Pain Intervention(s): Limited activity within patient's tolerance;Monitored during session;Repositioned;Patient requesting pain meds-RN notified    Home Living Family/patient expects to be discharged to:: Private residence Living Arrangements: Alone   Type of Home: House Home Access: Stairs to enter Entrance Stairs-Rails: Can reach both Entrance Stairs-Number of Steps: 2 Home Layout: One level Home Equipment: None Additional Comments: Daughter in room during PT evaluation.  Prior Function Level of Independence: Independent               Hand Dominance   Dominant Hand: Right    Extremity/Trunk Assessment   Upper Extremity Assessment: Overall WFL for tasks assessed           Lower Extremity Assessment: LLE deficits/detail   LLE Deficits / Details: pt reporting no phantom pain currently in left residual limb.  He was able to move it with trace contraction into hip flexion, abduction and extension assessment limited by pain, and his position in the bed.    Cervical / Trunk Assessment: Normal  Communication   Communication: No difficulties  Cognition Arousal/Alertness: Awake/alert Behavior During Therapy: WFL for tasks assessed/performed Overall Cognitive Status: Within Functional Limits for tasks assessed                      General Comments General comments (skin integrity, edema, etc.): Educated him about no pillows under his left residual limb to avoid hip flexion contractures.           Assessment/Plan    PT Assessment Patient needs continued PT services  PT Diagnosis Difficulty walking;Abnormality of gait;Generalized weakness;Acute pain   PT Problem List Decreased strength;Decreased range of motion;Decreased activity tolerance;Decreased balance;Decreased mobility;Decreased knowledge of use of DME;Cardiopulmonary status limiting activity;Obesity;Pain  PT Treatment Interventions DME instruction;Gait training;Stair training;Functional mobility training;Therapeutic activities;Therapeutic exercise;Balance training;Neuromuscular re-education;Cognitive remediation;Patient/family education;Wheelchair mobility training   PT Goals (Current goals can be found in the Care Plan section) Acute Rehab PT Goals Patient Stated Goal: pt live (he thinks he is dying) PT Goal Formulation: With patient Time For Goal Achievement: 01/15/16 Potential to Achieve Goals: Good    Frequency Min 3X/week   Barriers to discharge Inaccessible home environment;Decreased caregiver support         End of Session Equipment Utilized During Treatment: Oxygen Activity Tolerance: Patient limited by pain Patient left: in bed;with call bell/phone within reach;with family/visitor present;with nursing/sitter in room           Time: [id number] PT Time Calculation (min) (ACUTE ONLY): 31 min   Charges:   PT Evaluation $PT Eval Moderate Complexity: 1 Procedure PT Treatments $Therapeutic Activity: 8-22 mins    Shelley Pooley B. Wallace, Dawson, DPT  [phone]   01/08/2016, 6:07 PM

## 2016-01-08 NOTE — Progress Notes (Signed)
Pharmacy Antibiotic Note  Raymond Jacobson is a 75 y.o. male admitted on 01/01/2016 with pneumonia.  Pharmacy has been consulted for Vancomycin and Zosyn dosing.  8/9 CXR - increasing airspace opacities concerning for pneumonia S/p chest tube placement for spontaneous pneumothorax Currently afebrile and WBC wnl.   Plan: Vancomycin 1500mg  IV x1 then 1000mg  IV every 12 hours.  Goal trough 15-20 mcg/mL. Zosyn 3.375g IV q8h (4 hour infusion).  Monitor renal function, culture results, and clinical status.  Vancomycin trough at Css  Height: 5\' 8"  (172.7 cm) Weight: 194 lb (88 kg) IBW/kg (Calculated) : 68.4  Temp (24hrs), Avg:98.4 F (36.9 C), Min:97.7 F (36.5 C), Max:98.8 F (37.1 C)   Recent Labs Lab 01/02/16 0633  01/06/16 0210 01/07/16 0040 01/07/16 1852 01/08/16 0451 01/08/16 1209 01/08/16 2050  WBC 7.9  < > 6.7  --  11.5* 10.1 9.9 10.3  CREATININE 1.24  --  1.50* 1.12 1.06 1.08  --   --   < > = values in this interval not displayed.  Estimated Creatinine Clearance: 63.7 mL/min (by C-G formula based on SCr of 1.08 mg/dL).    Allergies  Allergen Reactions  . No Known Allergies     Antimicrobials this admission: Vanc 8/9 >> Zosyn 8/9 >>  Dose adjustments this admission: n/a  Microbiology results:  BCx:  8/9 UCx: sent  Sputum:    MRSA PCR:   Thank you for allowing pharmacy to be a part of this patient's care.  Sloan Leiter, PharmD, BCPS Clinical Pharmacist [phone] 01/08/2016 9:12 PM

## 2016-01-08 NOTE — Progress Notes (Addendum)
01/08/16 0800  Charting Type  Charting Type Shift assessment  Orders Chart Check (once per shift) Completed  Neurological  Neuro (WDL) WDL  Level of Consciousness Alert  Orientation Level Oriented X4  Cognition Appropriate at baseline;Appropriate safety awareness;Follows commands  Speech Clear  Pupil Assessment  No  Motor Function/Sensation Assessment Grip;Motor response;Sensation  R Hand Grip Present;Strong  L Hand Grip Present;Strong  RUE Motor Response Purposeful movement;Responds to commands  RUE Sensation Full sensation  LUE Motor Response Purposeful movement;Responds to commands  LUE Sensation Full sensation  RLE Motor Response Purposeful movement;Responds to commands  RLE Sensation Full sensation  LLE Motor Response Purposeful movement;Responds to commands  LLE Sensation Numbness;Pain  Neuro Symptoms None  Glasgow Coma Scale  Eye Opening 3  Best Verbal Response (NON-intubated) 5  Best Motor Response 6  Glasgow Coma Scale Score 14  Richmond Agitation Sedation Scale  Richmond Agitation Sedation Scale (RASS) 0  RASS Goal 0  CAM-ICU Feature 1:  Acute onset of fluctuating coarse  Is the patient different from his/her baseline mental status? No  Has the patient had any fluctuation in mental status in the past 24 hours? No  CAM-ICU Feature 4:  Disorganized thinking  CAM-ICU Negative? Yes  HEENT  HEENT (WDL) WDL  Vision Check No  Respiratory  Respiratory (WDL) WDL  Cough None  Respiratory Pattern Regular;Labored;Symmetrical  Chest Assessment Chest expansion symmetrical  Bilateral Breath Sounds Diminished  R Upper  Breath Sounds Diminished  L Upper Breath Sounds Diminished  R Lower Breath Sounds Diminished  L Lower Breath Sounds Diminished  Cardiac  Cardiac (WDL) WDL  Cardiac Rhythm NSR  Antiarrhythmic device No  Vascular  Vascular (WDL) X  Cyanosis None  Capillary Refill Less than 3 seconds  Pulses R radial;L radial;L popliteal;L posterior tibial;L dorsalis  pedis  Neurovascular Focused Assessment Yes  First Vascular Site Assessment  #1 - Location of Site Assessment Left femoral  #1 - Vascular Site Assessment Scale Level 0  RUE Neurovascular Assessment  R Radial Pulse +2  LUE Neurovascular Assessment  L Radial Pulse +2  RLE Neurovascular Assessment  R Dorsalis Pedis Pulse +2  LLE Neurovascular Assessment  LLE Capillary Refill  Less than/equal to 3 seconds  LLE Color  Appropriate for ethnicity  LLE Temperature/Moisture  Warm;Dry  L Femoral Pulse +2  Integumentary  Integumentary (WDL) X  Skin Color Appropriate for ethnicity  Skin Condition Dry  Skin Integrity Surgical Incision (see LDA)  Skin Turgor Non-tenting  Braden Interventions  Braden Scale Interventions Reposition q2h;Careplan;Pillow;Heels;SPH;Skin care  Musculoskeletal  Musculoskeletal (WDL) X  Generalized Weakness Yes  Weight Bearing Restrictions No  Musculoskeletal Details  LLE AKA  Gastrointestinal  Gastrointestinal (WDL) X  Abdomen Inspection Distended;Taut (Firm)  Bowel Sounds Assessment Active;High-pitched  Tenderness Nontender  Passing Flatus Yes  Gastrointestinal Additional Assessments None  Stool Characteristics  Bowel Incontinence No  GU Assessment  Genitourinary (WDL) X  Genitourinary Symptoms Urinary Catheter  Genitalia  Male Genitalia Intact  Urine Characteristics  Urinary Incontinence No  Urethral Catheter Eloy End, Rn Latex;Straight-tip 16 Fr.  Placement Date/Time: 01/06/16 1430   Perineal care performed prior to insertion?: Yes  Person Inserting Catheter: Eloy End, Rn  Person Assisting with Catheter Insertion: T. Vernona Rieger, Rn  Patient Location at Time of Insertion: OR 12  Catheter Type: L...  Indication for Insertion or Continuance of Catheter Unstable critical patients (first 24-48 hours)  Site Assessment Intact  Catheter Maintenance Bag below level of bladder;Insertion date on drainage bag;Catheter secured;No dependent loops;Drainage  bag/tubing not touching floor;Seal intact;Bag emptied prior to Engineer, agricultural drainage bag  Securement Method Securing device (Describe)  Urinary Catheter Interventions Unclamped  Psychosocial  Psychosocial (WDL) WDL  Wound/Incision (LDAs)  Type of Wound/Incision (LDA) Incision  Incision (Closed) 01/06/16 Groin Left  Date First Assessed/Time First Assessed: 01/06/16 1624   Location: Groin  Location Orientation: Left  Dressing Type Liquid skin adhesive  Dressing Clean;Intact  Site / Wound Assessment Clean;Dry;Pink  Margins Attached edges (approximated)  Closure Skin glue  Drainage Amount Scant  Drainage Description Serous  Incision (Closed) 01/07/16 Thigh Left  Date First Assessed/Time First Assessed: 01/07/16 1344   Location: Thigh  Location Orientation: Left  Dressing Type Compression wrap  Dressing Clean;Dry;Intact  Site / Wound Assessment Dressing in place / Unable to assess  Drainage Amount None  Incision (Closed) 01/07/16 Leg Left  Date First Assessed/Time First Assessed: 01/07/16 1821   Location: (c) Leg  Location Orientation: Left  Dressing Type Compression wrap  Dressing Clean;Dry;Intact  Site / Wound Assessment Dressing in place / Unable to assess  Drainage Amount None  At 0800 Pt with c/o belching, abd taunt, genitals swollen, night RN stated change from night shift, PA updated at [facility], pt then c/o nausea- zofran given, PKUB obtained, Radiologist called this RN stated unable to communicated with PA in OR,radiologist stated  pt needed to be seen by Trauma or CCM MD emergent(see pKUB report), MD Wyatt rounding on unit, This RN updated MD/Wyatt on communication, MD/Wyatt consulted at [facility] emergent, orders placed, R CT placed, NGT placed  1830-Pt stated to family he is not going to make it out here alive

## 2016-01-08 NOTE — Progress Notes (Signed)
79mL of Dilaudid from PCA pump wasted in sharps. Witnessed by Di Kindle, Therapist, sports.

## 2016-01-08 NOTE — Procedures (Signed)
Chest Tube Insertion Procedure Note  Indications:  Clinically significant Pneumothorax  Pre-operative Diagnosis: Pneumothorax  Post-operative Diagnosis: Pneumothorax and under tension  Procedure Details  Informed consent was obtained for the procedure, including sedation.  Risks of lung perforation, hemorrhage, arrhythmia, and adverse drug reaction were discussed.   After sterile skin prep, using standard technique, a 32 French tube was placed in the right anterolateral 6th rib space.  Findings: 25 ml of serous fluid obtained and air under tension  Estimated Blood Loss:  Minimal         Specimens:  None              Complications:  None; patient tolerated the procedure well.         Disposition: Done at the bedside in 3S         Condition: stable  Attending Attestation: I performed the procedure.

## 2016-01-09 ENCOUNTER — Encounter (HOSPITAL_COMMUNITY): Payer: Self-pay | Admitting: Radiology

## 2016-01-09 ENCOUNTER — Inpatient Hospital Stay (HOSPITAL_COMMUNITY): Payer: No Typology Code available for payment source

## 2016-01-09 DIAGNOSIS — Z419 Encounter for procedure for purposes other than remedying health state, unspecified: Secondary | ICD-10-CM

## 2016-01-09 DIAGNOSIS — I2699 Other pulmonary embolism without acute cor pulmonale: Secondary | ICD-10-CM

## 2016-01-09 DIAGNOSIS — S299XXA Unspecified injury of thorax, initial encounter: Secondary | ICD-10-CM

## 2016-01-09 DIAGNOSIS — S298XXA Other specified injuries of thorax, initial encounter: Secondary | ICD-10-CM

## 2016-01-09 DIAGNOSIS — R1010 Upper abdominal pain, unspecified: Secondary | ICD-10-CM

## 2016-01-09 DIAGNOSIS — I739 Peripheral vascular disease, unspecified: Secondary | ICD-10-CM

## 2016-01-09 DIAGNOSIS — J9600 Acute respiratory failure, unspecified whether with hypoxia or hypercapnia: Secondary | ICD-10-CM

## 2016-01-09 DIAGNOSIS — J939 Pneumothorax, unspecified: Secondary | ICD-10-CM

## 2016-01-09 LAB — GLUCOSE, CAPILLARY
GLUCOSE-CAPILLARY: 174 mg/dL — AB (ref 65–99)
GLUCOSE-CAPILLARY: 197 mg/dL — AB (ref 65–99)
GLUCOSE-CAPILLARY: 204 mg/dL — AB (ref 65–99)
Glucose-Capillary: 187 mg/dL — ABNORMAL HIGH (ref 65–99)

## 2016-01-09 LAB — BASIC METABOLIC PANEL
ANION GAP: 10 (ref 5–15)
ANION GAP: 11 (ref 5–15)
BUN: 15 mg/dL (ref 6–20)
BUN: 16 mg/dL (ref 6–20)
CHLORIDE: 98 mmol/L — AB (ref 101–111)
CHLORIDE: 96 mmol/L — AB (ref 101–111)
CO2: 28 mmol/L (ref 22–32)
CO2: 28 mmol/L (ref 22–32)
Calcium: 8.1 mg/dL — ABNORMAL LOW (ref 8.9–10.3)
Calcium: 8.2 mg/dL — ABNORMAL LOW (ref 8.9–10.3)
Creatinine, Ser: 1.14 mg/dL (ref 0.61–1.24)
Creatinine, Ser: 1.01 mg/dL (ref 0.61–1.24)
GFR calc non Af Amer: >60 mL/min (ref >60)
GFR calc non Af Amer: >60 mL/min (ref >60)
Glucose, Bld: 178 mg/dL — ABNORMAL HIGH (ref 65–99)
Glucose, Bld: 203 mg/dL — ABNORMAL HIGH (ref 65–99)
POTASSIUM: 3.1 mmol/L — AB (ref 3.5–5.1)
POTASSIUM: 3.1 mmol/L — AB (ref 3.5–5.1)
Sodium: 136 mmol/L (ref 135–145)
Sodium: 135 mmol/L (ref 135–145)

## 2016-01-09 LAB — CBC
HEMATOCRIT: 32 % — AB (ref 39.0–52.0)
HEMOGLOBIN: 10.6 g/dL — AB (ref 13.0–17.0)
MCH: 29 pg (ref 26.0–34.0)
MCHC: 33.1 g/dL (ref 30.0–36.0)
MCV: 87.7 fL (ref 78.0–100.0)
Platelets: 160 10*3/uL (ref 150–400)
RBC: 3.65 MIL/uL — AB (ref 4.22–5.81)
RDW: 14.6 % (ref 11.5–15.5)
WBC: 10.6 10*3/uL — AB (ref 4.0–10.5)

## 2016-01-09 LAB — MAGNESIUM: Magnesium: 1.6 mg/dL — ABNORMAL LOW (ref 1.7–2.4)

## 2016-01-09 LAB — URINE CULTURE: CULTURE: NO GROWTH

## 2016-01-09 LAB — HEPARIN LEVEL (UNFRACTIONATED): Heparin Unfractionated: 0.46 IU/mL (ref 0.30–0.70)

## 2016-01-09 LAB — PHOSPHORUS: Phosphorus: 2 mg/dL — ABNORMAL LOW (ref 2.5–4.6)

## 2016-01-09 LAB — TROPONIN I
TROPONIN I: 0.03 ng/mL — AB (ref <0.03)
TROPONIN I: 0.03 ng/mL — AB (ref <0.03)

## 2016-01-09 IMAGING — CR DG CHEST 1V PORT
1 series · 1 of 1 positions shown · non-contrast
Comparison: [DATE].

CLINICAL DATA: Respiratory failure.

EXAM:
PORTABLE CHEST 1 VIEW

[AP]
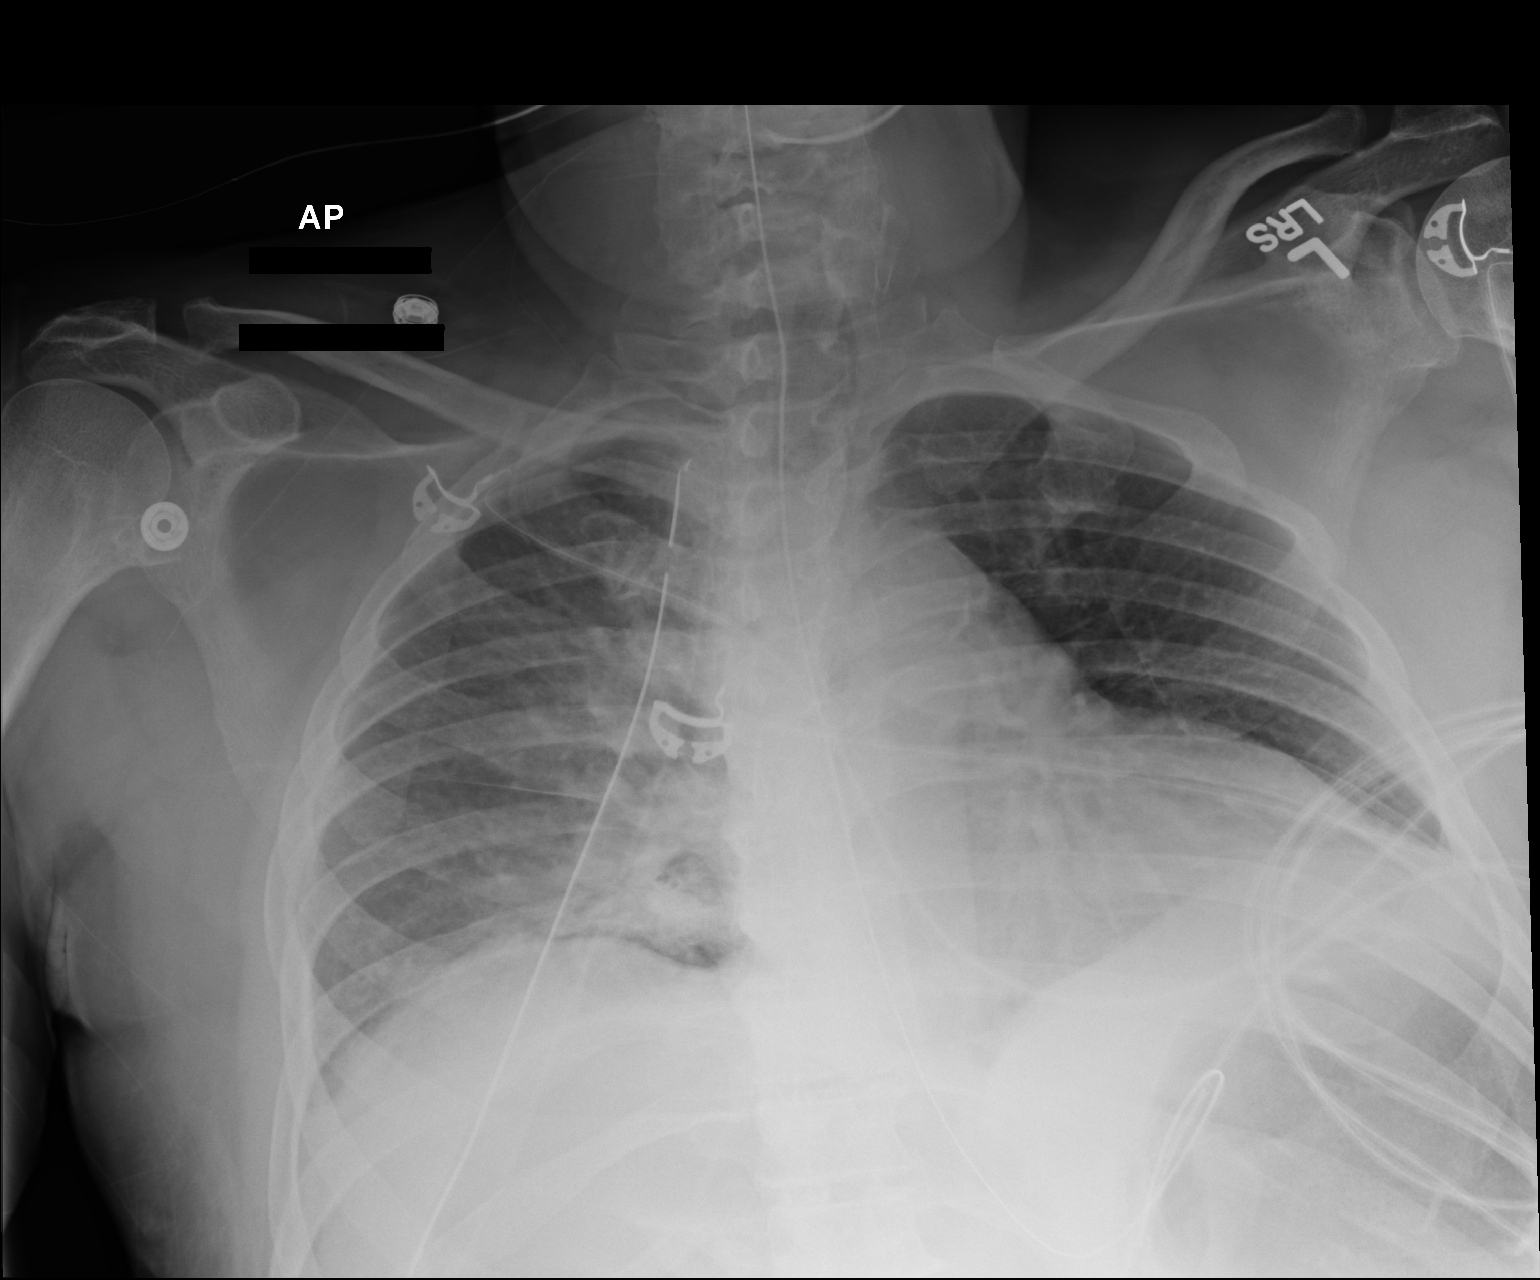

[1 of 1 positions shown; findings below may reference images not displayed]

FINDINGS: NG tube and right chest tube in stable position. Stable
cardiomegaly. Progressive right perihilar right base infiltrates.
Low lung volumes. No pleural effusion or pneumothorax .
IMPRESSION: 1. NG tube and right chest tube in stable position. No pneumothorax.
2. Progressive right perihilar and right base infiltrate. Low lung
volumes.
3. Stable cardiomegaly.

## 2016-01-09 MED ORDER — FUROSEMIDE 10 MG/ML IJ SOLN
40.0000 mg | Freq: Two times a day (BID) | INTRAMUSCULAR | Status: DC
  Administered 2016-01-09 – 2016-01-12 (×7): 40 mg via INTRAVENOUS
  Filled 2016-01-09 (×7): qty 4

## 2016-01-09 MED ORDER — POTASSIUM CHLORIDE IN NACL 20-0.45 MEQ/L-% IV SOLN
INTRAVENOUS | Status: DC
  Administered 2016-01-09: 09:00:00 via INTRAVENOUS
  Filled 2016-01-09: qty 1000

## 2016-01-09 MED ORDER — POTASSIUM CHLORIDE 10 MEQ/100ML IV SOLN
10.0000 meq | INTRAVENOUS | Status: AC
  Administered 2016-01-09 (×6): 10 meq via INTRAVENOUS
  Filled 2016-01-09 (×6): qty 100

## 2016-01-09 MED ORDER — PANTOPRAZOLE SODIUM 40 MG IV SOLR
40.0000 mg | Freq: Every day | INTRAVENOUS | Status: DC
  Administered 2016-01-09 – 2016-01-12 (×4): 40 mg via INTRAVENOUS
  Filled 2016-01-09 (×4): qty 40

## 2016-01-09 MED ORDER — POTASSIUM CHLORIDE 10 MEQ/100ML IV SOLN
10.0000 meq | INTRAVENOUS | Status: AC
  Administered 2016-01-09 (×2): 10 meq via INTRAVENOUS
  Filled 2016-01-09 (×2): qty 100

## 2016-01-09 MED ORDER — IOPAMIDOL (ISOVUE-300) INJECTION 61%
INTRAVENOUS | Status: AC
  Administered 2016-01-09: 75 mL
  Filled 2016-01-09: qty 75

## 2016-01-09 MED ORDER — CHLORHEXIDINE GLUCONATE 0.12 % MT SOLN
15.0000 mL | Freq: Two times a day (BID) | OROMUCOSAL | Status: DC
Start: 2016-01-09 — End: 2016-01-17
  Administered 2016-01-09 – 2016-01-17 (×13): 15 mL via OROMUCOSAL
  Filled 2016-01-09 (×7): qty 15

## 2016-01-09 MED ORDER — SODIUM CHLORIDE 0.9 % IV SOLN
INTRAVENOUS | Status: DC
  Administered 2016-01-09 – 2016-01-13 (×2): via INTRAVENOUS
  Administered 2016-01-14: 250 mL via INTRAVENOUS
  Administered 2016-01-15: 04:00:00 via INTRAVENOUS

## 2016-01-09 MED ORDER — INSULIN ASPART 100 UNIT/ML ~~LOC~~ SOLN
2.0000 [IU] | SUBCUTANEOUS | Status: DC
  Administered 2016-01-09: 6 [IU] via SUBCUTANEOUS
  Administered 2016-01-09 (×2): 4 [IU] via SUBCUTANEOUS
  Administered 2016-01-10 (×2): 2 [IU] via SUBCUTANEOUS
  Administered 2016-01-10: 6 [IU] via SUBCUTANEOUS
  Administered 2016-01-10: 2 [IU] via SUBCUTANEOUS
  Administered 2016-01-10: 6 [IU] via SUBCUTANEOUS
  Administered 2016-01-11: 2 [IU] via SUBCUTANEOUS
  Administered 2016-01-11: 5 [IU] via SUBCUTANEOUS
  Administered 2016-01-11: 2 [IU] via SUBCUTANEOUS
  Administered 2016-01-11 – 2016-01-12 (×3): 4 [IU] via SUBCUTANEOUS
  Administered 2016-01-12 (×2): 2 [IU] via SUBCUTANEOUS
  Administered 2016-01-12: 4 [IU] via SUBCUTANEOUS
  Administered 2016-01-13: 6 [IU] via SUBCUTANEOUS
  Administered 2016-01-13 (×4): 4 [IU] via SUBCUTANEOUS
  Administered 2016-01-14: 6 [IU] via SUBCUTANEOUS
  Administered 2016-01-14 (×3): 4 [IU] via SUBCUTANEOUS
  Administered 2016-01-14 (×2): 6 [IU] via SUBCUTANEOUS
  Administered 2016-01-15 (×2): 4 [IU] via SUBCUTANEOUS
  Administered 2016-01-15: 2 [IU] via SUBCUTANEOUS
  Administered 2016-01-15 (×2): 4 [IU] via SUBCUTANEOUS
  Administered 2016-01-16: 2 [IU] via SUBCUTANEOUS
  Administered 2016-01-16: 4 [IU] via SUBCUTANEOUS
  Administered 2016-01-16: 2 [IU] via SUBCUTANEOUS
  Administered 2016-01-17 (×2): 4 [IU] via SUBCUTANEOUS

## 2016-01-09 MED ORDER — POTASSIUM PHOSPHATES 15 MMOLE/5ML IV SOLN
30.0000 mmol | Freq: Once | INTRAVENOUS | Status: AC
  Administered 2016-01-09: 30 mmol via INTRAVENOUS
  Filled 2016-01-09: qty 10

## 2016-01-09 MED ORDER — CETYLPYRIDINIUM CHLORIDE 0.05 % MT LIQD
7.0000 mL | Freq: Two times a day (BID) | OROMUCOSAL | Status: DC
  Administered 2016-01-11 – 2016-01-17 (×9): 7 mL via OROMUCOSAL

## 2016-01-09 MED ORDER — MAGNESIUM SULFATE 2 GM/50ML IV SOLN
2.0000 g | Freq: Once | INTRAVENOUS | Status: AC
  Administered 2016-01-09: 2 g via INTRAVENOUS
  Filled 2016-01-09: qty 50

## 2016-01-09 MED ORDER — POTASSIUM CHLORIDE 10 MEQ/100ML IV SOLN
10.0000 meq | INTRAVENOUS | Status: AC
Start: 2016-01-09 — End: 2016-01-10
  Administered 2016-01-09 – 2016-01-10 (×2): 10 meq via INTRAVENOUS
  Filled 2016-01-09 (×2): qty 100

## 2016-01-09 NOTE — Progress Notes (Signed)
RN and NP [place] attempted to switch patient to venturi mask as patient's oxygen levels ranging 90-94%.  Patient placed on venturi mask at 50% oxygen and oxygen levels dropped to 80% and did not recover.  Patient placed back on nonrebreather.  NP [place] advised oxygen level on nonrebreather should not be less than 12 L O2 for the nonrebreather to work properly.  Patient placed on nonrebreather 15 L O2 and oxygen levels ranging 90-93%

## 2016-01-09 NOTE — Progress Notes (Signed)
[facility] [place]  Subjective  - Mouth dry, chest tube hurts   Objective (!) 142/76 (!) 106 98.2 F (36.8 C) (Oral) (!) 21 92%  Intake/Output Summary (Last 24 hours) at 01/09/16 0753 Last data filed at 01/09/16 0600  Gross per 24 hour  Intake           957.38 ml  Output             3585 ml  Net         -2627.62 ml   Left groin and left AKA healing No air leak right chest tube NG minimal, + flatus abdomen less distended  Assessment/Planning: 1. Pneumothorax resolved chest tube per Surgery hopefully out today or tomorrow 2.  Ileus clinically resolving but will leave NG at least until tomorrow, probably multifactorial continue to replete potassium 3. PAD currently incisions healing ACE kerlix left AKA, dry gauze left groin 4. Needs to get out of bed at least to chair today 5. Acute blood loss anemia trend 6. Respiratory distress improved wean O2 7 Continue heparin anticoagulation will restart NOAC when more stable  Ruta Hinds 01/09/2016 7:53 AM --  Laboratory Lab Results:  Recent Labs  01/08/16 2050 01/09/16 0202  WBC 10.3 10.6*  HGB 10.3* 10.6*  HCT 31.3* 32.0*  PLT 184 160   BMET  Recent Labs  01/08/16 2050 01/09/16 0202  NA 136 136  K 3.2* 3.1*  CL 100* 98*  CO2 25 28  GLUCOSE 166* 178*  BUN 15 15  CREATININE 1.14 1.14  CALCIUM 8.4* 8.1*    COAG Lab Results  Component Value Date   INR 1.00 01/01/2016   INR 1.72 (H) 12/24/2015   INR 1.06 10/01/2015   No results found for: PTT

## 2016-01-09 NOTE — Care Management Note (Addendum)
Case Management Note  Patient Details  Name: Raymond Jacobson MRN: [id number] Date of Birth: [birth year]  Subjective/Objective:   Redo Fem pop prior to 8/7 ,on 8/7 had 3rd redo,8/8 went for Left AKA.  8/9 am abd hard and distended. KUB shows R tension ptx, sats down to 88, put on 5 liters, has ng tube to suction, and right chest tube to suction.  Patient is preoperatively set up with [facility] for HHRN/HHPT.  He follows up with [facility]  [phone].  Fax [phone].  Will fax dc summary at time of dc.  NCM cont to follow for dc needs.                  Action/Plan:   Expected Discharge Date:                  Expected Discharge Plan:  [place]  In-House Referral:  NA  Discharge planning Services  CM Consult  Post Acute Care Choice:    Choice offered to:     DME Arranged:    DME Agency:     HH Arranged:  PT, RN HH Agency:  [place]  Status of Service:  Completed, signed off  If discussed at [place] of Stay Meetings, dates discussed:    Additional Comments:  Zenon Mayo, RN 01/09/2016, 12:26 AM

## 2016-01-09 NOTE — Progress Notes (Signed)
VASCULAR LAB PRELIMINARY  PRELIMINARY  PRELIMINARY  PRELIMINARY  Bilateral lower extremity venous duplex completed.    Preliminary report:  There is no obvious evidence of DVT or SVT noted in the visualized veins of the bilateral lower extremities.   Marti Mclane, RVT 01/09/2016, 6:28 PM

## 2016-01-09 NOTE — Progress Notes (Signed)
PCCM INTERVAL PROGRESS NOTE  CT angio reviewed, positive for saddle PE and L sided PE with large clot burden. RV/LV ration on CT 1.2, which does meet inclusion criteria for EKOS. Patient has been on heparin for Atrial fib. Discussed case with Dr. Oneida Alar who feels as though any bleeding complication for Mr. Nygard given his recent surgeries would pose a serious complication. I feel as though with heparin alone his oxygenation should recover, however, it will likely take much longer. Even taking into consideration the potential complications of pulmonary hypertension and diastolic CHF, risk of even 12mg  directed TPA felt to be greater.   Plan: -Continue heparin, modify per pharmacy to acute PE dosing. Would prefer to keep him at the higher end of the therapeutic range.  -Doppler legs to assess mobile DVT and evaluate for filter. -Formal echocardiogram  Georgann Housekeeper, AGACNP-BC [facility] Pager [phone] or [phone]  01/09/2016 3:18 PM   He has normal BP, HR, remains with some high flow O2 needs, No distress now, ration on CT 1.2, clot burden essentially unilateral, ratio on CT can be inaccurate, get ehco for RV fxn, heparin now, risk of bleeding complication discussed with vascular and concerning, will hold off ekos given risk / benefit ration unless clinically declines Dopplers sent  Lavon Paganini. Titus Mould, MD, [place] Pgr: [facility]

## 2016-01-09 NOTE — Progress Notes (Signed)
Name: Raymond Jacobson MRN: [id number] DOB: Oct 03, 1940    ADMISSION DATE:  01/01/2016 CONSULTATION DATE:  01/08/16  REFERRING MD :  Oneida Alar  CHIEF COMPLAINT:  SOB   HISTORY OF PRESENT ILLNESS:  Raymond Jacobson is a 75 y.o. male with a PMH as outlined below including significant PVD and claudication of the LLE.  He was admitted 8/2 for left femoral endarterectomy and left femoral to above knee popliteal bypass.  He unfortunately had problems with ischemia post op and was taken back to OR that afternoon for thrombectomy.  He then had recurrent ischemia again and on 8/7 was taken back to OR for redo.  On 8/8, after further ischemia, decision was made to proceed with AKA. On 8/9, pt had KUB to evaluate for ileus and had incidental finding of large right PTX with tension component.  A right sided chest tube was placed by general surgery with complete re-expansion of the right lung. Later that evening, pt had increase in dyspnea along with increase in O2 needs.  He was placed on NRB and was transferred to the ICU for further evaluation and management.   SUBJECTIVE:  Still dyspnea, and pain at CT site. High O2 demands.  VITAL SIGNS: Temp:  [97.7 F (36.5 C)-98.8 F (37.1 C)] 98.3 F (36.8 C) (08/10 0800) Pulse Rate:  [106-114] 108 (08/10 1100) Resp:  [18-30] 25 (08/10 1100) BP: (114-162)/(60-88) 114/60 (08/10 1100) SpO2:  [90 %-98 %] 93 % (08/10 1100)  PHYSICAL EXAMINATION: General: Caucasian male, resting in bed, in NAD. Neuro: A&O x 3, non-focal. HEENT: [place]/AT. PERRL, sclerae anicteric. NRB in place. Cardiovascular: ST 105, no M/R/G. Lungs: Respirations even and unlabored.  Faint bibasilar crackles.  R chest tube in place, no air leak. Abdomen: BS x 4, soft, NT/ND. Musculoskeletal: No gross deformities, no edema. Skin: Intact, warm, no rashes.   Recent Labs Lab 01/08/16 0451 01/08/16 2050 01/09/16 0202  NA 139 136 136  K 3.1* 3.2* 3.1*  CL 98* 100* 98*  CO2 29 25 28   BUN 14 15 15    CREATININE 1.08 1.14 1.14  GLUCOSE 124* 166* 178*    Recent Labs Lab 01/08/16 1209 01/08/16 2050 01/09/16 0202  HGB 10.0* 10.3* 10.6*  HCT 29.5* 31.3* 32.0*  WBC 9.9 10.3 10.6*  PLT 164 184 160   Dg Abd 1 View  Result Date: 01/08/2016 CLINICAL DATA:  Abdominal pain EXAM: ABDOMEN - 1 VIEW COMPARISON:  None. FINDINGS: Limited visualization of the lower thorax demonstrates a a suspected at least moderate size right-sided pneumothorax, incompletely imaged. There is marked gas distention of the stomach as well as mild gaseous distention of multiple loops of large and small bowel with index loop of small bowel measuring approximately 3.4 cm in diameter. No definitive supine evidence of pneumoperitoneum. No definite pneumatosis or portal venous gas. No acute osseus abnormalities. IMPRESSION: 1. Suspected at least moderate sized right-sided pneumothorax, incompletely imaged. Further evaluation dedicated chest radiograph is recommended. 2. Gaseous distention of the stomach, small bowel and colon, nonspecific though suggestive of ileus. Critical Value/emergent results were called by telephone at the time of interpretation on 01/08/2016 at 9:11 am to [facility], [place], as well the patient's nurse, Everlena Cooper, RN, who verbally acknowledged these results. Electronically Signed   By: Sandi Mariscal M.D.   On: 01/08/2016 09:39   Dg Chest Port 1 View  Result Date: 01/09/2016 CLINICAL DATA:  Respiratory failure. EXAM: PORTABLE CHEST 1 VIEW COMPARISON:  01/08/2016. FINDINGS: NG tube and right chest tube in stable  position. Stable cardiomegaly. Progressive right perihilar right base infiltrates. Low lung volumes. No pleural effusion or pneumothorax . IMPRESSION: 1. NG tube and right chest tube in stable position. No pneumothorax. 2. Progressive right perihilar and right base infiltrate. Low lung volumes. 3. Stable cardiomegaly. Electronically Signed   By: Marcello Moores  Register   On: 01/09/2016 07:10   Dg Chest Port 1  View  Result Date: 01/08/2016 CLINICAL DATA:  Abnormal respirations.  Pneumothorax. EXAM: PORTABLE CHEST 1 VIEW COMPARISON:  One-view chest x-ray from earlier the same day at 11:02 a.m. FINDINGS: The right-sided chest tube remains in place. There is no residual recurrent pneumothorax. Increasing airspace opacities are noted in the medial right lower lobe or right middle lobe. A small right pleural effusion is suspected. Left lung is clear. The lung volumes remain low. NG tube is in place. IMPRESSION: 1. Right chest to remains in place without pneumothorax. 2. Increasing airspace opacities at the right lower and right middle lobe are concerning for aspiration pneumonitis or pneumonia. 3. Low lung volumes. Electronically Signed   By: San Morelle M.D.   On: 01/08/2016 20:30   Dg Chest Port 1 View  Result Date: 01/08/2016 CLINICAL DATA:  Status post right chest tube insertion for tension pneumothorax and nasogastric tube insertion for ileus. EXAM: PORTABLE CHEST 1 VIEW COMPARISON:  Film earlier today at 0958 hours FINDINGS: Large bore single right chest tube has been placed extending up towards the apex. There is complete re-expansion of the right lung with no further visualized pneumothorax. Nasogastric tube extends into the fundus of the stomach and the stomach is now decompressed with resolution of gastric distention. Visualized upper colon shows persistent evidence of ileus. Mediastinum is now in a normal position with resolution of tension mass-effect. No pulmonary edema or pleural fluid identified. IMPRESSION: 1. Complete re-expansion of the right lung after chest tube placement with no further pneumothorax visualized and resolution of tension on the mediastinum. 2. Decompression of stomach after nasogastric tube placement. Colonic ileus remains visible. Electronically Signed   By: Aletta Edouard M.D.   On: 01/08/2016 11:33   Dg Chest Port 1 View  Result Date: 01/08/2016 CLINICAL DATA:  Right  pneumothorax and shortness of breath. EXAM: PORTABLE CHEST 1 VIEW COMPARISON:  Abdominal film earlier today. FINDINGS: Large right pneumothorax present with essential collapse of nearly the entire right lung. There likely is a tension component with visible shift of the mediastinum to the left. Left lung shows no significant abnormalities. There is significant gaseous distention of the stomach. IMPRESSION: Large right pneumothorax with tension component. Gaseous distention of the stomach, as seen on the abdominal film. These results will called by telephone to Dr. Judeth Horn . Electronically Signed   By: Aletta Edouard M.D.   On: 01/08/2016 10:36    STUDIES:  CXR 8/9 > large right PTX with tension component. CXR 8/9 > increasing opacities at the RLL and RML.  SIGNIFICANT EVENTS  8/2 > left femoral endarterectomy, left femoral to above knee popliteal bypass. 8/2 > back to OR for thrombectomy of left femoropopliteal bypass, intraoperative arteriogram. 8/7 > back to OR for redo of left femoral to above knee popliteal bypass. 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  Later that evening had increase in dyspnea and higher O2 requirements so transferred to ICU.   ASSESSMENT / PLAN:  Acute hypoxic respiratory failure. Mild pulmonary edema. Aspiration PNA Concern  PE Plan: Continue supplemental O2 as needed to maintain SpO2 > 92%. 40mg  Lasix q12 hours K 20 meq q 12 hours CTA chest Pulmonary hygiene CXR in AM Continue vanc zosyn  Large right PTX with tension component - s/p chest tube placement 8/9 by general surgery. Plan: Continue chest tube management per [facility].  >>>small leak per CCS 8/10 remain to suction.  PVD - s/p left AKA. Plan: Post op care per vascular.  Ileus. Plan: Continue NGT to LIS. NPO KUB   Georgann Housekeeper, AGACNP-BC [facility] Pulmonology/Critical Care Pager [phone] or [phone]  01/09/2016 11:55 AM   STAFF NOTE: Linwood Dibbles, MD FACP have personally reviewed patient's available data, including medical history, events of note, physical examination and test results as part of my evaluation. I have discussed with resident/NP and other care providers such as pharmacist, RN and RRT. In addition, I personally evaluated patient and elicited key findings of: mild distress, coarse bs, Chest tube in place, pcxr with small lung volumes, hilar prominence, his O2 needs are out of proportion to pcxr, I think his abdo is distended and contribute to lung volumes being low, concern remains for asp PNa on right, given O 2 needs would CT angio, add lasix to neg balance , abg assessment, he may need ETT and mechanical support, but not at this time, maintain zosyn for nosocomial exsposure, get kub repeat and assess ileus obstruction, no BM for days, I updated wife and daughter in room The patient is critically ill with multiple organ systems failure and requires high complexity decision making for assessment and support, frequent evaluation and titration of therapies, application of advanced monitoring technologies and extensive interpretation of multiple databases.   Critical Care Time devoted to patient care services described in this note is 30 Minutes. This time reflects time of care of this signee: Merrie Roof, MD FACP. This critical care time does not reflect procedure time, or teaching time or supervisory time of PA/NP/Med student/Med Resident etc but could involve care discussion time. Rest per NP/medical resident whose note is outlined above and that I agree with   Lavon Paganini. Titus Mould, MD, [place] Pgr: [facility] 01/09/2016 1:48 PM

## 2016-01-09 NOTE — Progress Notes (Signed)
Magnesium and phosphorous lab results called to NP Hoffman.

## 2016-01-09 NOTE — Progress Notes (Signed)
PT Cancellation Note  Patient Details Name: Raymond Jacobson MRN: [id number] DOB: [birth year]   Cancelled Treatment:    Reason Eval/Treat Not Completed: Medical issues which prohibited therapy (Pt currently on 10L NRB  ).  Will hold PT until pt more medically appropriate for exertional activity.   Collie Siad PT, DPT  Pager: [phone] Phone: [phone] 01/09/2016, 9:15 AM

## 2016-01-09 NOTE — Progress Notes (Signed)
PCCM INTERVAL PROGRESS NOTE  K remains low despite repeated supplementation  Called by RN with lab results.  K 3.1 Mag 1.6 Phos 2  Ca 8.2  Plan  Will supp 45mmol Kphos,  2G mag sulfate, and 63meq KCl Assess ionized calcium in AM  Repeat labs in AM    Georgann Housekeeper, [facility] Pulmonology/Critical Care Pager [phone] or [phone]  01/09/2016 6:14 PM

## 2016-01-09 NOTE — Progress Notes (Signed)
Attempted to see patient on 01/08/2016, was experiencing respiratory distress, now is not receiving any physical therapy due to respiratory issues, pneumothorax, has chest tube as well as O2 at 10 L nonrebreathing mask. Please reconsult once patient is able to tolerate therapy. Thank you

## 2016-01-09 NOTE — Progress Notes (Signed)
ANTICOAGULATION CONSULT NOTE  Pharmacy Consult for Heparin Indication: atrial fibrillation + occluded L leg bypass  s/p AKA 8/8  Patient Measurements: Height: 5\' 8"  (172.7 cm) Weight: 194 lb (88 kg) IBW/kg (Calculated) : 68.4 Heparin Dosing Weight: 86.5 kg  Vital Signs: Temp: 98.3 F (36.8 C) (08/10 0800) Temp Source: Oral (08/10 0800) BP: 114/60 (08/10 1100) Pulse Rate: 108 (08/10 1100)  Labs:  Recent Labs  01/07/16 0040 01/07/16 0451  01/08/16 0451 01/08/16 1209 01/08/16 2050 01/09/16 0202 01/09/16 0604  HGB  --   --   < > 9.9* 10.0* 10.3* 10.6*  --   HCT  --   --   < > 29.5* 29.5* 31.3* 32.0*  --   PLT  --   --   < > 196 164 184 160  --   APTT 87*  --   --   --   --   --   --   --   HEPARINUNFRC  --  <0.10*  --   --   --  0.42  --  0.46  CREATININE 1.12  --   < > 1.08  --  1.14 1.14  --   CKTOTAL  --   --   --   --   --  341  --   --   CKMB  --   --   --   --   --  5.1*  --   --   TROPONINI  --   --   --   --   --  <0.03 0.03* 0.03*  < > = values in this interval not displayed.  Estimated Creatinine Clearance: 60.3 mL/min (by C-G formula based on SCr of 1.14 mg/dL).   Assessment: 75 yo male with h/o AFib, Pradaxa PTA on hold.  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  Pharmacy consulted to resume heparin for PMH of afib.    Heparin level is therapeutic 0.46 on 1450 units/hr.  H/H stable, platelets 160. No overt bleeding noted. S/P chest tube placed for spontaneous pneumothorax.   Goal of Therapy:  Heparin level 0.3-0.7 units/ml Monitor platelets by anticoagulation protocol: Yes   Plan:  Continue heparin at 1450 units/hr.  Follow-up daily HL and CBC in AM.   Melburn Popper, PharmD Clinical Pharmacy Resident Pager: [phone] 01/09/16 11:59 AM

## 2016-01-09 NOTE — Progress Notes (Addendum)
Called by radiology.  CT chest shows pulmonary embolus.  Will obtain duplex of legs to rule out DVT.  Otherwise continue heparin. HIT profile has been negative on 2 occasions.  Ruta Hinds, MD [facility] Office: [phone] Pager: [phone]

## 2016-01-09 NOTE — Progress Notes (Signed)
Patient ID: Raymond Jacobson, male   DOB: 08-15-1940, 75 y.o.   MRN: [id number] 2 Days Post-Op  Subjective: No SOB, minimal flatus  Objective: Vital signs in last 24 hours: Temp:  [97.7 F (36.5 C)-98.8 F (37.1 C)] 98.2 F (36.8 C) (08/10 0400) Pulse Rate:  [97-114] 106 (08/10 0620) Resp:  [14-30] 21 (08/10 0620) BP: (126-162)/(71-88) 142/76 (08/10 0620) SpO2:  [92 %-98 %] 92 % (08/10 0620) Last BM Date: 01/06/16  Intake/Output from previous day: 08/09 0701 - 08/10 0700 In: 957.4 [I.V.:257.4; IV Piggyback:700] Out: [id number] [Urine:3275; Emesis/NG output:550; Chest Tube:10] Intake/Output this shift: No intake/output data recorded.  General appearance: cooperative Resp: clear to auscultation bilaterally GI: soft, distended, quiet, NT  Small air leak from CT  Lab Results: CBC   Recent Labs  01/08/16 2050 01/09/16 0202  WBC 10.3 10.6*  HGB 10.3* 10.6*  HCT 31.3* 32.0*  PLT 184 160   BMET  Recent Labs  01/08/16 2050 01/09/16 0202  NA 136 136  K 3.2* 3.1*  CL 100* 98*  CO2 25 28  GLUCOSE 166* 178*  BUN 15 15  CREATININE 1.14 1.14  CALCIUM 8.4* 8.1*   PT/INR No results for input(s): LABPROT, INR in the last 72 hours. ABG  Recent Labs  01/08/16 1950  PHART 7.461*  HCO3 26.1*    Studies/Results: Dg Abd 1 View  Result Date: 01/08/2016 CLINICAL DATA:  Abdominal pain EXAM: ABDOMEN - 1 VIEW COMPARISON:  None. FINDINGS: Limited visualization of the lower thorax demonstrates a a suspected at least moderate size right-sided pneumothorax, incompletely imaged. There is marked gas distention of the stomach as well as mild gaseous distention of multiple loops of large and small bowel with index loop of small bowel measuring approximately 3.4 cm in diameter. No definitive supine evidence of pneumoperitoneum. No definite pneumatosis or portal venous gas. No acute osseus abnormalities. IMPRESSION: 1. Suspected at least moderate sized right-sided pneumothorax, incompletely imaged.  Further evaluation dedicated chest radiograph is recommended. 2. Gaseous distention of the stomach, small bowel and colon, nonspecific though suggestive of ileus. Critical Value/emergent results were called by telephone at the time of interpretation on 01/08/2016 at 9:11 am to [facility], [place], as well the patient's nurse, Everlena Cooper, RN, who verbally acknowledged these results. Electronically Signed   By: Sandi Mariscal M.D.   On: 01/08/2016 09:39   Dg Chest Port 1 View  Result Date: 01/09/2016 CLINICAL DATA:  Respiratory failure. EXAM: PORTABLE CHEST 1 VIEW COMPARISON:  01/08/2016. FINDINGS: NG tube and right chest tube in stable position. Stable cardiomegaly. Progressive right perihilar right base infiltrates. Low lung volumes. No pleural effusion or pneumothorax . IMPRESSION: 1. NG tube and right chest tube in stable position. No pneumothorax. 2. Progressive right perihilar and right base infiltrate. Low lung volumes. 3. Stable cardiomegaly. Electronically Signed   By: Marcello Moores  Register   On: 01/09/2016 07:10   Dg Chest Port 1 View  Result Date: 01/08/2016 CLINICAL DATA:  Abnormal respirations.  Pneumothorax. EXAM: PORTABLE CHEST 1 VIEW COMPARISON:  One-view chest x-ray from earlier the same day at 11:02 a.m. FINDINGS: The right-sided chest tube remains in place. There is no residual recurrent pneumothorax. Increasing airspace opacities are noted in the medial right lower lobe or right middle lobe. A small right pleural effusion is suspected. Left lung is clear. The lung volumes remain low. NG tube is in place. IMPRESSION: 1. Right chest to remains in place without pneumothorax. 2. Increasing airspace opacities at the right lower and right middle  lobe are concerning for aspiration pneumonitis or pneumonia. 3. Low lung volumes. Electronically Signed   By: San Morelle M.D.   On: 01/08/2016 20:30   Dg Chest Port 1 View  Result Date: 01/08/2016 CLINICAL DATA:  Status post right chest tube insertion for  tension pneumothorax and nasogastric tube insertion for ileus. EXAM: PORTABLE CHEST 1 VIEW COMPARISON:  Film earlier today at 0958 hours FINDINGS: Large bore single right chest tube has been placed extending up towards the apex. There is complete re-expansion of the right lung with no further visualized pneumothorax. Nasogastric tube extends into the fundus of the stomach and the stomach is now decompressed with resolution of gastric distention. Visualized upper colon shows persistent evidence of ileus. Mediastinum is now in a normal position with resolution of tension mass-effect. No pulmonary edema or pleural fluid identified. IMPRESSION: 1. Complete re-expansion of the right lung after chest tube placement with no further pneumothorax visualized and resolution of tension on the mediastinum. 2. Decompression of stomach after nasogastric tube placement. Colonic ileus remains visible. Electronically Signed   By: Aletta Edouard M.D.   On: 01/08/2016 11:33   Dg Chest Port 1 View  Result Date: 01/08/2016 CLINICAL DATA:  Right pneumothorax and shortness of breath. EXAM: PORTABLE CHEST 1 VIEW COMPARISON:  Abdominal film earlier today. FINDINGS: Large right pneumothorax present with essential collapse of nearly the entire right lung. There likely is a tension component with visible shift of the mediastinum to the left. Left lung shows no significant abnormalities. There is significant gaseous distention of the stomach. IMPRESSION: Large right pneumothorax with tension component. Gaseous distention of the stomach, as seen on the abdominal film. These results will called by telephone to Dr. Judeth Horn . Electronically Signed   By: Aletta Edouard M.D.   On: 01/08/2016 10:36    Anti-infectives: Anti-infectives    Start     Dose/Rate Route Frequency Ordered Stop   01/09/16 0930  vancomycin (VANCOCIN) IVPB 1000 mg/200 mL premix     1,000 mg 200 mL/hr over 60 Minutes Intravenous Every 12 hours 01/08/16 2121      01/08/16 2200  piperacillin-tazobactam (ZOSYN) IVPB 3.375 g     3.375 g 12.5 mL/hr over 240 Minutes Intravenous Every 8 hours 01/08/16 2121     01/08/16 2130  vancomycin (VANCOCIN) 1,500 mg in sodium chloride 0.9 % 500 mL IVPB     1,500 mg 250 mL/hr over 120 Minutes Intravenous  Once 01/08/16 2121 01/09/16 0030   01/07/16 1800  cefUROXime (ZINACEF) 1.5 g in dextrose 5 % 50 mL IVPB  Status:  Discontinued     1.5 g 100 mL/hr over 30 Minutes Intravenous Every 12 hours 01/07/16 1743 01/08/16 1124   01/07/16 1430  cefUROXime (ZINACEF) 1.5 g in dextrose 5 % 50 mL IVPB     1.5 g 100 mL/hr over 30 Minutes Intravenous  Once 01/07/16 1425 01/07/16 1409   01/07/16 1350  dextrose 5 % with cefUROXime (ZINACEF) ADS Med    Comments:  Kerrie Pleasure   : cabinet override      01/07/16 1350 01/08/16 0159   01/06/16 2151  dextrose 5 % with cefUROXime (ZINACEF) ADS Med    Comments:  Haynes Bast   : cabinet override      01/06/16 2151 01/06/16 2200   01/06/16 1824  dextrose 5 % with cefUROXime (ZINACEF) ADS Med    Comments:  Trixie Deis   : cabinet override      01/06/16 1824 01/07/16 [id number]  01/06/16 0600  cefUROXime (ZINACEF) 1.5 g in dextrose 5 % 50 mL IVPB     1.5 g 100 mL/hr over 30 Minutes Intravenous On call to O.R. 01/05/16 1035 01/06/16 1430   01/01/16 2200  cefUROXime (ZINACEF) 1.5 g in dextrose 5 % 50 mL IVPB     1.5 g 100 mL/hr over 30 Minutes Intravenous Every 12 hours 01/01/16 2154 01/02/16 1030   01/01/16 1736  dextrose 5 % with cefUROXime (ZINACEF) ADS Med    Comments:  Gershon Crane   : cabinet override      01/01/16 1736 01/02/16 0544   01/01/16 0552  cefUROXime (ZINACEF) 1.5 g in dextrose 5 % 50 mL IVPB     1.5 g 100 mL/hr over 30 Minutes Intravenous 30 min pre-op 01/01/16 0552 01/01/16 0902      Assessment/Plan: R PTX - continue CT to suction today as small air leak, CXR - no PTX Ileus - mostly colonic, continue NGT  LOS: 8 days    Georganna Skeans, MD, MPH, FACS Trauma:  [phone] General Surgery: [phone]  01/09/2016

## 2016-01-09 NOTE — Progress Notes (Signed)
CRITICAL VALUE ALERT  Critical value received:  Troponin 0.03  Date of notification:  01/09/16  Time of notification:  0300  Critical value read back:yes  Nurse who received alert:  Alben Deeds  MD notified (1st page):  [place]  Time of first page: 0302  Also, K 3.1 reported. Orders to be placed.

## 2016-01-09 NOTE — Progress Notes (Signed)
OT Cancellation Note  Patient Details Name: Raymond Jacobson MRN: [id number] DOB: 30-Jan-1941   Cancelled Treatment:    Reason Eval/Treat Not Completed: Medical issues which prohibited therapy.  Pt on 100% non rebreather with sats ranging 87-91% and RR low 30s at rest.   Will reattempt.   [place], OTR/L [id number]   Lucille Passy M 01/09/2016, 12:50 PM

## 2016-01-10 ENCOUNTER — Inpatient Hospital Stay (HOSPITAL_COMMUNITY): Payer: No Typology Code available for payment source

## 2016-01-10 DIAGNOSIS — I2699 Other pulmonary embolism without acute cor pulmonale: Secondary | ICD-10-CM

## 2016-01-10 LAB — BASIC METABOLIC PANEL
ANION GAP: 13 (ref 5–15)
ANION GAP: 9 (ref 5–15)
BUN: 11 mg/dL (ref 6–20)
BUN: 15 mg/dL (ref 6–20)
CALCIUM: 8.3 mg/dL — AB (ref 8.9–10.3)
CHLORIDE: 94 mmol/L — AB (ref 101–111)
CO2: 33 mmol/L — AB (ref 22–32)
CO2: 32 mmol/L (ref 22–32)
CREATININE: 0.93 mg/dL (ref 0.61–1.24)
CREATININE: 1.09 mg/dL (ref 0.61–1.24)
Calcium: 8.2 mg/dL — ABNORMAL LOW (ref 8.9–10.3)
Chloride: 91 mmol/L — ABNORMAL LOW (ref 101–111)
GFR calc non Af Amer: >60 mL/min (ref >60)
Glucose, Bld: 152 mg/dL — ABNORMAL HIGH (ref 65–99)
Glucose, Bld: 122 mg/dL — ABNORMAL HIGH (ref 65–99)
POTASSIUM: 3.1 mmol/L — AB (ref 3.5–5.1)
Potassium: 2.8 mmol/L — ABNORMAL LOW (ref 3.5–5.1)
SODIUM: 135 mmol/L (ref 135–145)
Sodium: 137 mmol/L (ref 135–145)

## 2016-01-10 LAB — BLOOD GAS, ARTERIAL
Acid-Base Excess: 4.6 mmol/L — ABNORMAL HIGH (ref 0.0–2.0)
Bicarbonate: 28.2 mEq/L — ABNORMAL HIGH (ref 20.0–24.0)
DRAWN BY: 313061
FIO2: 1
O2 Saturation: 97.1 %
PCO2 ART: 38.8 mmHg (ref 35.0–45.0)
PH ART: 7.474 — AB (ref 7.350–7.450)
PO2 ART: 86.8 mmHg (ref 80.0–100.0)
Patient temperature: 98.6
TCO2: 29.3 mmol/L (ref 0–100)

## 2016-01-10 LAB — GLUCOSE, CAPILLARY
GLUCOSE-CAPILLARY: 202 mg/dL — AB (ref 65–99)
GLUCOSE-CAPILLARY: 124 mg/dL — AB (ref 65–99)
Glucose-Capillary: 140 mg/dL — ABNORMAL HIGH (ref 65–99)
Glucose-Capillary: 233 mg/dL — ABNORMAL HIGH (ref 65–99)
Glucose-Capillary: 152 mg/dL — ABNORMAL HIGH (ref 65–99)

## 2016-01-10 LAB — CBC
HCT: 32.7 % — ABNORMAL LOW (ref 39.0–52.0)
HEMOGLOBIN: 10.8 g/dL — AB (ref 13.0–17.0)
MCH: 28.7 pg (ref 26.0–34.0)
MCHC: 33 g/dL (ref 30.0–36.0)
MCV: 87 fL (ref 78.0–100.0)
Platelets: 133 10*3/uL — ABNORMAL LOW (ref 150–400)
RBC: 3.76 MIL/uL — AB (ref 4.22–5.81)
RDW: 14.4 % (ref 11.5–15.5)
WBC: 9 10*3/uL (ref 4.0–10.5)

## 2016-01-10 LAB — ECHOCARDIOGRAM COMPLETE
Height: 68 in
WEIGHTICAEL: 3104 [oz_av]

## 2016-01-10 LAB — HEPARIN LEVEL (UNFRACTIONATED): HEPARIN UNFRACTIONATED: 0.37 [IU]/mL (ref 0.30–0.70)

## 2016-01-10 LAB — PHOSPHORUS: PHOSPHORUS: 2.5 mg/dL (ref 2.5–4.6)

## 2016-01-10 LAB — MAGNESIUM: Magnesium: 1.9 mg/dL (ref 1.7–2.4)

## 2016-01-10 MED ORDER — POTASSIUM CHLORIDE 10 MEQ/100ML IV SOLN
10.0000 meq | INTRAVENOUS | Status: AC
  Administered 2016-01-10 (×6): 10 meq via INTRAVENOUS
  Filled 2016-01-10 (×5): qty 100

## 2016-01-10 MED ORDER — POTASSIUM CHLORIDE 20 MEQ/15ML (10%) PO SOLN
40.0000 meq | ORAL | Status: AC
  Administered 2016-01-10 (×2): 40 meq
  Filled 2016-01-10 (×2): qty 30

## 2016-01-10 MED ORDER — PERFLUTREN LIPID MICROSPHERE
1.0000 mL | INTRAVENOUS | Status: AC | PRN
  Administered 2016-01-10: 10 mL via INTRAVENOUS
  Filled 2016-01-10: qty 10

## 2016-01-10 MED ORDER — PERFLUTREN LIPID MICROSPHERE
INTRAVENOUS | Status: AC
  Filled 2016-01-10: qty 10

## 2016-01-10 NOTE — Evaluation (Signed)
Occupational Therapy Evaluation Patient Details Name: Raymond Jacobson MRN: [id number] DOB: 22-Dec-1940 Today's Date: 01/10/2016    History of Present Illness 75 y.o. male admitted to [facility] on 01/01/16 for L leg claudication symptoms.  He underwent L femoral endarterectomy and left femoral to above knee popliteal bypass on 01/01/16.  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  NGT placed due to post op ileus and incidental finding of R PTX, so R CT placed on 01/08/16.  Pt with significant PMHx of SOB, PVD, MI, HTN, DVT, and CAD.   Clinical Impression   Pt admitted with above. He demonstrates the below listed deficits and will benefit from continued OT to maximize safety and independence with BADLs.  He demonstrates impaired activity tolerance, decreased activity tolerance, impaired balance.  He currently requires max A for ADLs.  Recommend CIR to allow him to maximize  Independence to return home with s/o       Follow Up Recommendations  CIR;Supervision/Assistance - 24 hour    Equipment Recommendations  3 in 1 bedside comode;Tub/shower bench;Wheelchair (measurements OT)    Recommendations for Other Services Rehab consult     Precautions / Restrictions Precautions Precautions: Fall Precaution Comments: chest tube, NG tube      Mobility Bed Mobility               General bed mobility comments: Pt refused EOB activity due to fatigue   Transfers                      Balance                                            ADL Overall ADL's : Needs assistance/impaired Eating/Feeding: NPO   Grooming: Wash/dry hands;Oral care;Wash/dry face;Minimal assistance;Bed level   Upper Body Bathing: Moderate assistance;Bed level   Lower Body Bathing: Total assistance;Bed level   Upper Body Dressing : Maximal assistance;Bed level   Lower Body Dressing: Total assistance;Sit to/from stand               Functional mobility during ADLs: +2 for physical assistance;Moderate assistance       Vision     Perception     Praxis      Pertinent Vitals/Pain Pain Assessment: Faces Faces Pain Scale: Hurts little more Pain Location: Rt CT site with UE exercises  Pain Descriptors / Indicators: Grimacing;Guarding Pain Intervention(s): Repositioned;Limited activity within patient's tolerance     Hand Dominance Right   Extremity/Trunk Assessment Upper Extremity Assessment Upper Extremity Assessment: Generalized weakness   Lower Extremity Assessment Lower Extremity Assessment: Defer to PT evaluation   Cervical / Trunk Assessment Cervical / Trunk Assessment: Normal   Communication Communication Communication: No difficulties   Cognition Arousal/Alertness: Awake/alert Behavior During Therapy: WFL for tasks assessed/performed Overall Cognitive Status: Within Functional Limits for tasks assessed                     General Comments       Exercises Exercises: Other exercises Other Exercises Other Exercises: Pt performed UE exercises - 15 reps minimally resisted shoulder flex/ext, abd/add, elbow flex/ext    Shoulder Instructions      Home Living Family/patient expects to be discharged to:: Private residence Living Arrangements: Alone Available Help at Discharge: Friend(s) Type of  Home: House Home Access: Stairs to enter [employer] of Steps: 2 Entrance Stairs-Rails: Can reach both Home Layout: One level     Bathroom Shower/Tub: Tub/shower unit Shower/tub characteristics: Architectural technologist: Standard Bathroom Accessibility: No   Home Equipment: None   Additional Comments: Daughter in room during PT evaluation.       Prior Functioning/Environment Level of Independence: Independent             OT Diagnosis: Generalized weakness;Acute pain   OT Problem List: Decreased range of motion;Decreased activity tolerance;Impaired balance (sitting  and/or standing);Decreased safety awareness;Decreased knowledge of use of DME or AE;Cardiopulmonary status limiting activity;Pain   OT Treatment/Interventions: Self-care/ADL training;Therapeutic exercise;DME and/or AE instruction;Energy conservation;Therapeutic activities;Patient/family education;Other (comment)    OT Goals(Current goals can be found in the care plan section) Acute Rehab OT Goals Patient Stated Goal: to get better  OT Goal Formulation: With patient/family Time For Goal Achievement: 01/24/16 Potential to Achieve Goals: Good ADL Goals Pt Will Perform Grooming: with set-up;sitting Pt Will Perform Upper Body Bathing: with set-up;sitting Pt Will Perform Lower Body Bathing: with mod assist;sit to/from stand Pt Will Perform Upper Body Dressing: with set-up;sitting Pt Will Perform Lower Body Dressing: with mod assist;sitting/lateral leans Pt Will Transfer to Toilet: with mod assist;stand pivot transfer;bedside commode Pt Will Perform Toileting - Clothing Manipulation and hygiene: with mod assist;sit to/from stand Pt/caregiver will Perform Home Exercise Program: Increased strength;Right Upper extremity;Left upper extremity;With theraband;Independently  OT Frequency: Min 2X/week   Barriers to D/C: Decreased caregiver support          Co-evaluation              End of Session Equipment Utilized During Treatment: Oxygen Nurse Communication: Mobility status  Activity Tolerance: Patient tolerated treatment well Patient left: in bed;with call bell/phone within reach;with bed alarm set;with family/visitor present   Time: [id number] OT Time Calculation (min): 16 min Charges:  OT General Charges $OT Visit: 1 Procedure OT Evaluation $OT Eval Moderate Complexity: 1 Procedure G-Codes:    Lucille Passy M 01-18-16, 7:56 PM

## 2016-01-10 NOTE — Progress Notes (Signed)
PT Cancellation Note  Patient Details Name: Raymond Jacobson MRN: [id number] DOB: 07/15/1940   Cancelled Treatment:    Reason Eval/Treat Not Completed: Patient at procedure or test/unavailable Pt getting test done. Will follow up as time allows.   Marguarite Arbour A Marceline Napierala 01/10/2016, 8:29 AM Wray Kearns, [place], DPT [phone]

## 2016-01-10 NOTE — Progress Notes (Signed)
RT called to pts bedside for low sats. Pts sats stable on 6L [place]. RT will continue to follow.

## 2016-01-10 NOTE — Progress Notes (Signed)
[facility]  Subjective  - feels better, had BM, passing flatus   Objective 107/85 87 98 F (36.7 C) (Oral) (!) 25 100%  Intake/Output Summary (Last 24 hours) at 01/10/16 0842 Last data filed at 01/10/16 0700  Gross per 24 hour  Intake          2473.33 ml  Output             4705 ml  Net         -2231.67 ml   Abdomen distended but less so, no pain Chest tube: minimal output, no air leak Left AKA still edematous no drainage from incisions  Assessment/Planning: Overall improved 1. PE continue heparin, probably restart his pradaxa early next week if continues to improve and can take PO, wean O2 as per CCM 2. Ileus resolving continue to replete potassium, hopefully NG out soon per General surgery 3. Pneumothorax chest tube to water seal hopefully out tomorrow 4. AKA/fem pop Continue ACE wrap AKA dry gauze left groin, hopefully start considering rehab early next week   Ruta Hinds 01/10/2016 8:42 AM --  Laboratory Lab Results:  Recent Labs  01/09/16 0202 01/10/16 0309  WBC 10.6* 9.0  HGB 10.6* 10.8*  HCT 32.0* 32.7*  PLT 160 133*   BMET  Recent Labs  01/09/16 1528 01/10/16 0309  NA 135 137  K 3.1* 2.8*  CL 96* 91*  CO2 28 33*  GLUCOSE 203* 152*  BUN 16 11  CREATININE 1.01 0.93  CALCIUM 8.2* 8.3*    COAG Lab Results  Component Value Date   INR 1.00 01/01/2016   INR 1.72 (H) 12/24/2015   INR 1.06 10/01/2015   No results found for: PTT

## 2016-01-10 NOTE — Progress Notes (Signed)
  Echocardiogram 2D Echocardiogram has been performed with definity.  Raymond Jacobson 01/10/2016, 8:34 AM

## 2016-01-10 NOTE — Progress Notes (Addendum)
Name: Raymond Jacobson MRN: [id number] DOB: 10-23-1940    ADMISSION DATE:  01/01/2016 CONSULTATION DATE:  01/08/16  REFERRING MD :  Oneida Alar  CHIEF COMPLAINT:  SOB   HISTORY OF PRESENT ILLNESS:  Raymond Jacobson is a 75 y.o. male with a PMH as outlined below including significant PVD and claudication of the LLE.  He was admitted 8/2 for left femoral endarterectomy and left femoral to above knee popliteal bypass.  He unfortunately had problems with ischemia post op and was taken back to OR that afternoon for thrombectomy.  He then had recurrent ischemia again and on 8/7 was taken back to OR for redo.  On 8/8, after further ischemia, decision was made to proceed with AKA. On 8/9, pt had KUB to evaluate for ileus and had incidental finding of large right PTX with tension component.  A right sided chest tube was placed by general surgery with complete re-expansion of the right lung. Later that evening, pt had increase in dyspnea along with increase in O2 needs.  He was placed on NRB and was transferred to the ICU for further evaluation and management.   SUBJECTIVE:  More comfortable, Pain chest and leg tolerable  VITAL SIGNS: Temp:  [97.9 F (36.6 C)-98.2 F (36.8 C)] 98 F (36.7 C) (08/11 0400) Pulse Rate:  [87-112] 87 (08/11 0700) Resp:  [11-25] 25 (08/11 0700) BP: (107-140)/(60-85) 107/85 (08/11 0700) SpO2:  [92 %-100 %] 100 % (08/11 0700)  PHYSICAL EXAMINATION: General: Caucasian male, resting in bed, in NAD. CO pain rt chest and L leg Neuro: A&O x 3, non-focal. HEENT: [place]/AT. PERRL, sclerae anicteric. NRB in place.NRB to be dc 8/11 Cardiovascular: ST 94, no M/R/G. Lungs: Respirations even and unlabored. Decreased bs rt base.  R chest tube in place, no air leak., water seal Abdomen:distended, tender, NGT to suction, faint bs Musculoskeletal: No gross deformities, no edema. Skin: Intact, warm, no rashes.   Recent Labs Lab 01/09/16 0202 01/09/16 1528 01/10/16 0309  NA 136 135 137  K 3.1*  3.1* 2.8*  CL 98* 96* 91*  CO2 28 28 33*  BUN 15 16 11   CREATININE 1.14 1.01 0.93  GLUCOSE 178* 203* 152*    Recent Labs Lab 01/08/16 2050 01/09/16 0202 01/10/16 0309  HGB 10.3* 10.6* 10.8*  HCT 31.3* 32.0* 32.7*  WBC 10.3 10.6* 9.0  PLT 184 160 133*   Dg Abd 1 View  Result Date: 01/08/2016 CLINICAL DATA:  Abdominal pain EXAM: ABDOMEN - 1 VIEW COMPARISON:  None. FINDINGS: Limited visualization of the lower thorax demonstrates a a suspected at least moderate size right-sided pneumothorax, incompletely imaged. There is marked gas distention of the stomach as well as mild gaseous distention of multiple loops of large and small bowel with index loop of small bowel measuring approximately 3.4 cm in diameter. No definitive supine evidence of pneumoperitoneum. No definite pneumatosis or portal venous gas. No acute osseus abnormalities. IMPRESSION: 1. Suspected at least moderate sized right-sided pneumothorax, incompletely imaged. Further evaluation dedicated chest radiograph is recommended. 2. Gaseous distention of the stomach, small bowel and colon, nonspecific though suggestive of ileus. Critical Value/emergent results were called by telephone at the time of interpretation on 01/08/2016 at 9:11 am to [facility] [place], as well the patient's nurse, Everlena Cooper, RN, who verbally acknowledged these results. Electronically Signed   By: Sandi Mariscal M.D.   On: 01/08/2016 09:39   Ct Chest W Contrast  Result Date: 01/09/2016 CLINICAL DATA:  Right pneumothorax 01/08/2016 with subsequent chest tube placement. Increasing  dyspnea and acute hypoxic respiratory failure. EXAM: CT CHEST WITH CONTRAST TECHNIQUE: Multidetector CT imaging of the chest was performed during intravenous contrast administration. CONTRAST:  75 ml ISOVUE-300 IOPAMIDOL (ISOVUE-300) INJECTION 61% COMPARISON:  Multiple prior plain films of the chest, last earlier today. FINDINGS: Cardiovascular: Embolus is seen at the bifurcation of the main  pulmonary arteries with occlusive thrombus in the descending left interlobar pulmonary artery. No definite embolus is seen on the right. The RV to LV ratio is 1.2 compatible with right heart strain. Calcific coronary artery disease is identified. Mediastinum/Nodes: No lymphadenopathy. Lungs/Pleura: Small right and tiny left pleural effusions are identified. A small focus of airspace disease is seen along the patient's right chest tube likely reflecting some hemorrhage. Patchy airspace disease is also seen in the posterior right upper lobe lobe. Mild compressive atelectasis is identified in the right base. Minimal atelectasis is seen on the left. No residual pneumothorax. Upper Abdomen: Marked gaseous distention of colon is identified. There appears to be some sludge or small stones in the gallbladder versus vicarious excretion of contrast. Trace amount of upper abdominal ascites is identified. Musculoskeletal: No fracture or worrisome marrow lesion. IMPRESSION: Positive for acute PE with CT evidence of right heart strain (RV/LV Ratio = 1.2) consistent with at least submassive (intermediate risk) PE. The presence of right heart strain has been associated with an increased risk of morbidity and mortality. Please activate Code PE by paging [phone]. Negative for pneumothorax with a right chest tube in place. Small right and trace left pleural effusions. Patchy airspace opacity in the posterior left upper lobe could be due to pneumonia, aspiration or atelectasis. Partial visualization gaseous distention of bowel in the upper abdomen likely due date ileus. Calcific coronary artery disease. Critical Value/emergent results were called by telephone at the time of interpretation on 01/09/2016 at 2:43 pm to Dr. Ruta Hinds, who verbally acknowledged these results. Electronically Signed   By: Inge Rise M.D.   On: 01/09/2016 14:44   Dg Chest Port 1 View  Result Date: 01/10/2016 CLINICAL DATA:  Pneumothorax.   Chest tube. EXAM: PORTABLE CHEST 1 VIEW COMPARISON:  CT 01/09/2016.  Chest x-ray 03/2016. FINDINGS: Right chest tube in stable position. No pneumothorax. NG tube in stable position. Stable cardiomegaly. Persistent right base atelectasis and or infiltrate. No pleural effusion. IMPRESSION: 1. Right chest tube and NG tube in stable position. No pneumothorax. 2. Persistent right base atelectasis and or infiltrate. No interim change. 3. Stable cardiomegaly.  No pulmonary venous congestion. Electronically Signed   By: Marcello Moores  Register   On: 01/10/2016 07:03   Dg Chest Port 1 View  Result Date: 01/09/2016 CLINICAL DATA:  Respiratory failure. EXAM: PORTABLE CHEST 1 VIEW COMPARISON:  01/08/2016. FINDINGS: NG tube and right chest tube in stable position. Stable cardiomegaly. Progressive right perihilar right base infiltrates. Low lung volumes. No pleural effusion or pneumothorax . IMPRESSION: 1. NG tube and right chest tube in stable position. No pneumothorax. 2. Progressive right perihilar and right base infiltrate. Low lung volumes. 3. Stable cardiomegaly. Electronically Signed   By: Marcello Moores  Register   On: 01/09/2016 07:10   Dg Chest Port 1 View  Result Date: 01/08/2016 CLINICAL DATA:  Abnormal respirations.  Pneumothorax. EXAM: PORTABLE CHEST 1 VIEW COMPARISON:  One-view chest x-ray from earlier the same day at 11:02 a.m. FINDINGS: The right-sided chest tube remains in place. There is no residual recurrent pneumothorax. Increasing airspace opacities are noted in the medial right lower lobe or right middle lobe. A small  right pleural effusion is suspected. Left lung is clear. The lung volumes remain low. NG tube is in place. IMPRESSION: 1. Right chest to remains in place without pneumothorax. 2. Increasing airspace opacities at the right lower and right middle lobe are concerning for aspiration pneumonitis or pneumonia. 3. Low lung volumes. Electronically Signed   By: San Morelle M.D.   On: 01/08/2016 20:30    Dg Chest Port 1 View  Result Date: 01/08/2016 CLINICAL DATA:  Status post right chest tube insertion for tension pneumothorax and nasogastric tube insertion for ileus. EXAM: PORTABLE CHEST 1 VIEW COMPARISON:  Film earlier today at 0958 hours FINDINGS: Large bore single right chest tube has been placed extending up towards the apex. There is complete re-expansion of the right lung with no further visualized pneumothorax. Nasogastric tube extends into the fundus of the stomach and the stomach is now decompressed with resolution of gastric distention. Visualized upper colon shows persistent evidence of ileus. Mediastinum is now in a normal position with resolution of tension mass-effect. No pulmonary edema or pleural fluid identified. IMPRESSION: 1. Complete re-expansion of the right lung after chest tube placement with no further pneumothorax visualized and resolution of tension on the mediastinum. 2. Decompression of stomach after nasogastric tube placement. Colonic ileus remains visible. Electronically Signed   By: Aletta Edouard M.D.   On: 01/08/2016 11:33   Dg Chest Port 1 View  Result Date: 01/08/2016 CLINICAL DATA:  Right pneumothorax and shortness of breath. EXAM: PORTABLE CHEST 1 VIEW COMPARISON:  Abdominal film earlier today. FINDINGS: Large right pneumothorax present with essential collapse of nearly the entire right lung. There likely is a tension component with visible shift of the mediastinum to the left. Left lung shows no significant abnormalities. There is significant gaseous distention of the stomach. IMPRESSION: Large right pneumothorax with tension component. Gaseous distention of the stomach, as seen on the abdominal film. These results will called by telephone to Dr. Judeth Horn . Electronically Signed   By: Aletta Edouard M.D.   On: 01/08/2016 10:36   Dg Abd Portable 1v  Result Date: 01/09/2016 CLINICAL DATA:  Abdominal distension.  Hypoxemia.  Recent left BKA. EXAM: PORTABLE  ABDOMEN - 1 VIEW COMPARISON:  01/08/2016 abdominal radiograph FINDINGS: Enteric tube terminates in the body of the stomach. Gaseous distention of the stomach is significantly decreased. There is mild gaseous distention of small bowel loops throughout the abdomen, slightly decreased. There is moderate gaseous distention of the entire colon, not appreciably changed. No evidence of pneumatosis or pneumoperitoneum. No pathologic soft tissue calcifications. Moderate thoracolumbar spondylosis. IMPRESSION: 1. Enteric tube terminates in the body of the stomach. Significantly decreased gaseous distention of the stomach . 2. Improving adynamic ileus with decreased mild diffuse small bowel dilatation and stable moderate diffuse colonic dilatation. Electronically Signed   By: Ilona Sorrel M.D.   On: 01/09/2016 13:36    STUDIES:  CXR 8/9 > large right PTX with tension component. CXR 8/9 > increasing opacities at the RLL and RML. 8/11  CTA with PE, small rt effusion, rt ct in good position, no pnx  SIGNIFICANT EVENTS  8/2 > left femoral endarterectomy, left femoral to above knee popliteal bypass. 8/2 > back to OR for thrombectomy of left femoropopliteal bypass, intraoperative arteriogram. 8/7 > back to OR for redo of left femoral to above knee popliteal bypass. 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  Later  that evening had increase in dyspnea and higher O2 requirements so transferred to ICU. 8/11  CTA with PE, small rt effusion, rt ct in good position, no pnx   ASSESSMENT / PLAN:  Acute hypoxic respiratory failure. Mild pulmonary edema. Aspiration PNA less likley  PE at bifurcation main pul art. Thrombus in left des.+ rt heart strain Plan: Continue supplemental O2 as needed to maintain SpO2 > 92%. 40mg  Lasix q12 hours K 20 meq q 12 hours Pulmonary hygiene CXR in AM Dc vanc, zosyn and observe Hep drip for PE, EKOS deferred  due to recent AKA DC 100% Fio2 and tirate O2 for sats >93 %  Large right PTX with tension component - s/p chest tube placement 8/9 by general surgery. Plan: Continue chest tube management per CCS.  >>>on water seal 8/11 with no air leak  PVD - s/p left AKA. Plan: Post op care per vascular.  Hyperkalemia  Recent Labs Lab 01/09/16 0202 01/09/16 1528 01/10/16 0309  K 3.1* 3.1* 2.8*   Lab Results  Component Value Date   CREATININE 0.93 01/10/2016   CREATININE 1.01 01/09/2016   CREATININE 1.14 01/09/2016     Plan: Replete K+ as needed Check bmet at 1800 on q12h lasix  Ileus. Plan: Continue NGT to LIS. NPO KUB   Richardson Landry Minor ACNP Maryanna Shape PCCM Pager [phone] till 3 pm If no answer page [phone] 01/10/2016, 9:00 AM   STAFF NOTE: I, Merrie Roof, MD FACP have personally reviewed patient's available data, including medical history, events of note, physical examination and test results as part of my evaluation. I have discussed with resident/NP and other care providers such as pharmacist, RN and RRT. In addition, I personally evaluated patient and elicited key findings of:  IMproved clinical status, no fevers, clinical picture is PE left with PTX, would dc abx and observe off, heaprin to continue, maintain lasix to same neg balance 2.2 liters goal, k supp, mag supp, would NOT add oral agent yet with bleeding risk and reversibility concerns on oral agents, chest tube to water seal, pcxr in am, if no ptx and no leak , hope can dc, he is improved on nasal cannula O2, consider to sdu, lasix / contrast recent - dc acei  Lavon Paganini. Titus Mould, MD, [place] Pgr: [facility] 01/10/2016 11:16 AM

## 2016-01-10 NOTE — Progress Notes (Signed)
ANTICOAGULATION CONSULT NOTE  Pharmacy Consult for Heparin Indication: atrial fibrillation + occluded L leg bypass s/p AKA 8/8 + PE  Patient Measurements: Height: 5\' 8"  (172.7 cm) Weight: 194 lb (88 kg) IBW/kg (Calculated) : 68.4 Heparin Dosing Weight: 86.5 kg  Vital Signs: Temp: 97.2 F (36.2 C) (08/11 1200) Temp Source: Axillary (08/11 1200) BP: 94/59 (08/11 1500) Pulse Rate: 92 (08/11 1500)  Labs:  Recent Labs  01/08/16 2050 01/09/16 0202 01/09/16 0604 01/09/16 1528 01/10/16 0309 01/10/16 1525  HGB 10.3* 10.6*  --   --  10.8*  --   HCT 31.3* 32.0*  --   --  32.7*  --   PLT 184 160  --   --  133*  --   HEPARINUNFRC 0.42  --  0.46  --  0.37 <0.10*  CREATININE 1.14 1.14  --  1.01 0.93  --   CKTOTAL 341  --   --   --   --   --   CKMB 5.1*  --   --   --   --   --   TROPONINI <0.03 0.03* 0.03*  --   --   --     Estimated Creatinine Clearance: 74 mL/min (by C-G formula based on SCr of 0.93 mg/dL).   Assessment: 75 yo male with h/o AFib, Pradaxa PTA on hold.  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  Per critical care note on 8/10 - would prefer to keep him at the higher end of therapeutic range. HL after slight rate increase went from 0.37 to < 0.1? Called and talked to RN who assured me that the heparin gtt has been running continuously at same rate all day. Looks like bag was changed out around 1420 and RN told me that there were no issues with switching out the bags and did not have the heparin on hold at all today. Hgb 10.8, platelets 133. No overt bleeding noted.   Goal of Therapy:  Heparin level 0.3-0.7 units/ml Monitor platelets by anticoagulation protocol: Yes   Plan:  Increase heparin gtt to 1,850 units/hr Check 8 hr HL Monitor daily HL, CBC, s/s of bleed  Elenor Quinones, PharmD, [facility] Clinical Pharmacist Pager [phone] 01/10/2016 4:54 PM

## 2016-01-10 NOTE — Progress Notes (Signed)
[facility] Electrolyte Replacement Protocol  Patient Name: Raymond Jacobson DOB: 1940/08/25 MRN: [id number]  Date of Service  01/10/2016   HPI/Events of Note    Recent Labs Lab 01/08/16 0451 01/08/16 2050 01/09/16 0202 01/09/16 1528 01/09/16 1654 01/10/16 0309  NA 139 136 136 135  --  137  K 3.1* 3.2* 3.1* 3.1*  --  2.8*  CL 98* 100* 98* 96*  --  91*  CO2 29 25 28 28   --  33*  GLUCOSE 124* 166* 178* 203*  --  152*  BUN 14 15 15 16   --  11  CREATININE 1.08 1.14 1.14 1.01  --  0.93  CALCIUM 8.6* 8.4* 8.1* 8.2*  --  8.3*  MG  --   --   --   --  1.6* 1.9  PHOS  --   --   --   --  2.0* 2.5    Estimated Creatinine Clearance: 74 mL/min (by C-G formula based on SCr of 0.93 mg/dL).  Intake/Output      08/10 0701 - 08/11 0700   I.V. (mL/kg) 484.3 (5.5)   IV Piggyback 1910   Total Intake(mL/kg) 2394.3 (27.2)   Urine (mL/kg/hr) 4355 (2.1)   Chest Tube 93 (0)   Total Output 4448   Net -2053.7        - I/O DETAILED x24h    Total I/O In: 1305 [I.V.:245; IV Piggyback:1060] Out: 1973 [id number]; Chest Tube:23] - I/O THIS SHIFT    ASSESSMENT   eICURN Interventions  K+ 2.8 Replaced using ICU replacement protocol   ASSESSMENT: [place], Saurav Crumble Nicole 01/10/2016, 5:44 AM

## 2016-01-10 NOTE — Progress Notes (Addendum)
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  Patient Measurements: Height: 5\' 8"  (172.7 cm) Weight: 194 lb (88 kg) IBW/kg (Calculated) : 68.4 Heparin Dosing Weight: 86.5 kg  Vital Signs: Temp: 98 F (36.7 C) (08/11 0400) Temp Source: Oral (08/11 0400) BP: 107/85 (08/11 0700) Pulse Rate: 87 (08/11 0700)  Labs:  Recent Labs  01/08/16 2050 01/09/16 0202 01/09/16 0604 01/09/16 1528 01/10/16 0309  HGB 10.3* 10.6*  --   --  10.8*  HCT 31.3* 32.0*  --   --  32.7*  PLT 184 160  --   --  133*  HEPARINUNFRC 0.42  --  0.46  --  0.37  CREATININE 1.14 1.14  --  1.01 0.93  CKTOTAL 341  --   --   --   --   CKMB 5.1*  --   --   --   --   TROPONINI <0.03 0.03* 0.03*  --   --     Estimated Creatinine Clearance: 74 mL/min (by C-G formula based on SCr of 0.93 mg/dL).   Assessment: 75 yo male with h/o AFib, Pradaxa PTA on hold.  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  Per critical care note on 8/10 - would prefer to keep him at the higher end of therapeutic range. Heparin level is therapeutic 0.37 on 1450 units/hr and will increase rate slightly today. Hgb 10.8, platelets 133. No overt bleeding noted. S/P chest tube placed for spontaneous pneumothorax.   Goal of Therapy:  Heparin level 0.3-0.7 units/ml Monitor platelets by anticoagulation protocol: Yes   Plan:  Increase heparin slightly to 1600 units/hr 6 hour HL Follow-up daily HL and CBC in AM  Melburn Popper, PharmD Clinical Pharmacy Resident Pager: [phone] 01/10/16 7:45 AM

## 2016-01-10 NOTE — Progress Notes (Signed)
Inpatient Diabetes Program Recommendations  AACE/ADA: New Consensus Statement on Inpatient Glycemic Control (2015)  Target Ranges:  Prepandial:   less than 140 mg/dL      Peak postprandial:   less than 180 mg/dL (1-2 hours)      Critically ill patients:  140 - 180 mg/dL   Results for Raymond Jacobson, Raymond Jacobson (MRN [id number]) as of 01/10/2016 09:29  Ref. Range 01/09/2016 17:04 01/09/2016 20:08 01/09/2016 23:46 01/10/2016 03:40 01/10/2016 08:45  Glucose-Capillary Latest Ref Range: 65 - 99 mg/dL 187 (H) 197 (H) 204 (H) 140 (H) 233 (H)    Inpatient Diabetes Program Recommendations:  HgbA1C: Last A1C in the chart 6.3% on 06/20/2013. Please consider ordering an A1C to evaluate glycemic control over the past 2-3 months.  Thank you, Nani Gasser. [name], RN, MSN, CDE Inpatient Glycemic Control Team Team Pager [phone] (8am-5pm) 01/10/2016 9:32 AM

## 2016-01-10 NOTE — Progress Notes (Signed)
Trauma Service Note  Subjective: Patient looks great today.  Being treated with heparin for a PE.    Objective: Vital signs in last 24 hours: Temp:  [97.9 F (36.6 C)-98.3 F (36.8 C)] 98 F (36.7 C) (08/11 0400) Pulse Rate:  [87-112] 87 (08/11 0700) Resp:  [11-26] 25 (08/11 0700) BP: (107-147)/(60-85) 107/85 (08/11 0700) SpO2:  [90 %-100 %] 100 % (08/11 0700) Last BM Date: 01/06/16  Intake/Output from previous day: 08/10 0701 - 08/11 0700 In: 2487.8 [I.V.:527.8; IV Piggyback:1960] Out: [id number] [Urine:4530; Emesis/NG output:50; Chest Tube:125] Intake/Output this shift: No intake/output data recorded.  General: No distress.  Lungs: Clear to auscultation.  No air leak.  CXR shows no pneumothorax  Abd: Distended, good breath sounds.  Patient passed flatus and had BM this AM  Extremities: No changes.  Left AKA  Neuro: Intact  Lab Results: CBC   Recent Labs  01/09/16 0202 01/10/16 0309  WBC 10.6* 9.0  HGB 10.6* 10.8*  HCT 32.0* 32.7*  PLT 160 133*   BMET  Recent Labs  01/09/16 1528 01/10/16 0309  NA 135 137  K 3.1* 2.8*  CL 96* 91*  CO2 28 33*  GLUCOSE 203* 152*  BUN 16 11  CREATININE 1.01 0.93  CALCIUM 8.2* 8.3*   PT/INR No results for input(s): LABPROT, INR in the last 72 hours. ABG  Recent Labs  01/08/16 1950 01/09/16 1410  PHART 7.461* 7.474*  HCO3 26.1* 28.2*    Studies/Results: Dg Abd 1 View  Result Date: 01/08/2016 CLINICAL DATA:  Abdominal pain EXAM: ABDOMEN - 1 VIEW COMPARISON:  None. FINDINGS: Limited visualization of the lower thorax demonstrates a a suspected at least moderate size right-sided pneumothorax, incompletely imaged. There is marked gas distention of the stomach as well as mild gaseous distention of multiple loops of large and small bowel with index loop of small bowel measuring approximately 3.4 cm in diameter. No definitive supine evidence of pneumoperitoneum. No definite pneumatosis or portal venous gas. No acute osseus  abnormalities. IMPRESSION: 1. Suspected at least moderate sized right-sided pneumothorax, incompletely imaged. Further evaluation dedicated chest radiograph is recommended. 2. Gaseous distention of the stomach, small bowel and colon, nonspecific though suggestive of ileus. Critical Value/emergent results were called by telephone at the time of interpretation on 01/08/2016 at 9:11 am to [facility], [place], as well the patient's nurse, Everlena Cooper, RN, who verbally acknowledged these results. Electronically Signed   By: Sandi Mariscal M.D.   On: 01/08/2016 09:39   Ct Chest W Contrast  Result Date: 01/09/2016 CLINICAL DATA:  Right pneumothorax 01/08/2016 with subsequent chest tube placement. Increasing dyspnea and acute hypoxic respiratory failure. EXAM: CT CHEST WITH CONTRAST TECHNIQUE: Multidetector CT imaging of the chest was performed during intravenous contrast administration. CONTRAST:  75 ml ISOVUE-300 IOPAMIDOL (ISOVUE-300) INJECTION 61% COMPARISON:  Multiple prior plain films of the chest, last earlier today. FINDINGS: Cardiovascular: Embolus is seen at the bifurcation of the main pulmonary arteries with occlusive thrombus in the descending left interlobar pulmonary artery. No definite embolus is seen on the right. The RV to LV ratio is 1.2 compatible with right heart strain. Calcific coronary artery disease is identified. Mediastinum/Nodes: No lymphadenopathy. Lungs/Pleura: Small right and tiny left pleural effusions are identified. A small focus of airspace disease is seen along the patient's right chest tube likely reflecting some hemorrhage. Patchy airspace disease is also seen in the posterior right upper lobe lobe. Mild compressive atelectasis is identified in the right base. Minimal atelectasis is seen on the left.  No residual pneumothorax. Upper Abdomen: Marked gaseous distention of colon is identified. There appears to be some sludge or small stones in the gallbladder versus vicarious excretion of contrast.  Trace amount of upper abdominal ascites is identified. Musculoskeletal: No fracture or worrisome marrow lesion. IMPRESSION: Positive for acute PE with CT evidence of right heart strain (RV/LV Ratio = 1.2) consistent with at least submassive (intermediate risk) PE. The presence of right heart strain has been associated with an increased risk of morbidity and mortality. Please activate Code PE by paging [phone]. Negative for pneumothorax with a right chest tube in place. Small right and trace left pleural effusions. Patchy airspace opacity in the posterior left upper lobe could be due to pneumonia, aspiration or atelectasis. Partial visualization gaseous distention of bowel in the upper abdomen likely due date ileus. Calcific coronary artery disease. Critical Value/emergent results were called by telephone at the time of interpretation on 01/09/2016 at 2:43 pm to Dr. Ruta Hinds, who verbally acknowledged these results. Electronically Signed   By: Inge Rise M.D.   On: 01/09/2016 14:44   Dg Chest Port 1 View  Result Date: 01/10/2016 CLINICAL DATA:  Pneumothorax.  Chest tube. EXAM: PORTABLE CHEST 1 VIEW COMPARISON:  CT 01/09/2016.  Chest x-ray 03/2016. FINDINGS: Right chest tube in stable position. No pneumothorax. NG tube in stable position. Stable cardiomegaly. Persistent right base atelectasis and or infiltrate. No pleural effusion. IMPRESSION: 1. Right chest tube and NG tube in stable position. No pneumothorax. 2. Persistent right base atelectasis and or infiltrate. No interim change. 3. Stable cardiomegaly.  No pulmonary venous congestion. Electronically Signed   By: Marcello Moores  Register   On: 01/10/2016 07:03   Dg Chest Port 1 View  Result Date: 01/09/2016 CLINICAL DATA:  Respiratory failure. EXAM: PORTABLE CHEST 1 VIEW COMPARISON:  01/08/2016. FINDINGS: NG tube and right chest tube in stable position. Stable cardiomegaly. Progressive right perihilar right base infiltrates. Low lung volumes. No  pleural effusion or pneumothorax . IMPRESSION: 1. NG tube and right chest tube in stable position. No pneumothorax. 2. Progressive right perihilar and right base infiltrate. Low lung volumes. 3. Stable cardiomegaly. Electronically Signed   By: Marcello Moores  Register   On: 01/09/2016 07:10   Dg Chest Port 1 View  Result Date: 01/08/2016 CLINICAL DATA:  Abnormal respirations.  Pneumothorax. EXAM: PORTABLE CHEST 1 VIEW COMPARISON:  One-view chest x-ray from earlier the same day at 11:02 a.m. FINDINGS: The right-sided chest tube remains in place. There is no residual recurrent pneumothorax. Increasing airspace opacities are noted in the medial right lower lobe or right middle lobe. A small right pleural effusion is suspected. Left lung is clear. The lung volumes remain low. NG tube is in place. IMPRESSION: 1. Right chest to remains in place without pneumothorax. 2. Increasing airspace opacities at the right lower and right middle lobe are concerning for aspiration pneumonitis or pneumonia. 3. Low lung volumes. Electronically Signed   By: San Morelle M.D.   On: 01/08/2016 20:30   Dg Chest Port 1 View  Result Date: 01/08/2016 CLINICAL DATA:  Status post right chest tube insertion for tension pneumothorax and nasogastric tube insertion for ileus. EXAM: PORTABLE CHEST 1 VIEW COMPARISON:  Film earlier today at 0958 hours FINDINGS: Large bore single right chest tube has been placed extending up towards the apex. There is complete re-expansion of the right lung with no further visualized pneumothorax. Nasogastric tube extends into the fundus of the stomach and the stomach is now decompressed with resolution of  gastric distention. Visualized upper colon shows persistent evidence of ileus. Mediastinum is now in a normal position with resolution of tension mass-effect. No pulmonary edema or pleural fluid identified. IMPRESSION: 1. Complete re-expansion of the right lung after chest tube placement with no further  pneumothorax visualized and resolution of tension on the mediastinum. 2. Decompression of stomach after nasogastric tube placement. Colonic ileus remains visible. Electronically Signed   By: Aletta Edouard M.D.   On: 01/08/2016 11:33   Dg Chest Port 1 View  Result Date: 01/08/2016 CLINICAL DATA:  Right pneumothorax and shortness of breath. EXAM: PORTABLE CHEST 1 VIEW COMPARISON:  Abdominal film earlier today. FINDINGS: Large right pneumothorax present with essential collapse of nearly the entire right lung. There likely is a tension component with visible shift of the mediastinum to the left. Left lung shows no significant abnormalities. There is significant gaseous distention of the stomach. IMPRESSION: Large right pneumothorax with tension component. Gaseous distention of the stomach, as seen on the abdominal film. These results will called by telephone to Dr. Judeth Horn . Electronically Signed   By: Aletta Edouard M.D.   On: 01/08/2016 10:36   Dg Abd Portable 1v  Result Date: 01/09/2016 CLINICAL DATA:  Abdominal distension.  Hypoxemia.  Recent left BKA. EXAM: PORTABLE ABDOMEN - 1 VIEW COMPARISON:  01/08/2016 abdominal radiograph FINDINGS: Enteric tube terminates in the body of the stomach. Gaseous distention of the stomach is significantly decreased. There is mild gaseous distention of small bowel loops throughout the abdomen, slightly decreased. There is moderate gaseous distention of the entire colon, not appreciably changed. No evidence of pneumatosis or pneumoperitoneum. No pathologic soft tissue calcifications. Moderate thoracolumbar spondylosis. IMPRESSION: 1. Enteric tube terminates in the body of the stomach. Significantly decreased gaseous distention of the stomach . 2. Improving adynamic ileus with decreased mild diffuse small bowel dilatation and stable moderate diffuse colonic dilatation. Electronically Signed   By: Ilona Sorrel M.D.   On: 01/09/2016 13:36     Anti-infectives: Anti-infectives    Start     Dose/Rate Route Frequency Ordered Stop   01/09/16 0930  vancomycin (VANCOCIN) IVPB 1000 mg/200 mL premix     1,000 mg 200 mL/hr over 60 Minutes Intravenous Every 12 hours 01/08/16 2121     01/08/16 2200  piperacillin-tazobactam (ZOSYN) IVPB 3.375 g     3.375 g 12.5 mL/hr over 240 Minutes Intravenous Every 8 hours 01/08/16 2121     01/08/16 2130  vancomycin (VANCOCIN) 1,500 mg in sodium chloride 0.9 % 500 mL IVPB     1,500 mg 250 mL/hr over 120 Minutes Intravenous  Once 01/08/16 2121 01/09/16 0030   01/07/16 1800  cefUROXime (ZINACEF) 1.5 g in dextrose 5 % 50 mL IVPB  Status:  Discontinued     1.5 g 100 mL/hr over 30 Minutes Intravenous Every 12 hours 01/07/16 1743 01/08/16 1124   01/07/16 1430  cefUROXime (ZINACEF) 1.5 g in dextrose 5 % 50 mL IVPB     1.5 g 100 mL/hr over 30 Minutes Intravenous  Once 01/07/16 1425 01/07/16 1409   01/07/16 1350  dextrose 5 % with cefUROXime (ZINACEF) ADS Med    Comments:  Kerrie Pleasure   : cabinet override      01/07/16 1350 01/08/16 0159   01/06/16 2151  dextrose 5 % with cefUROXime (ZINACEF) ADS Med    Comments:  Haynes Bast   : cabinet override      01/06/16 2151 01/06/16 2200   01/06/16 1824  dextrose 5 %  with cefUROXime (ZINACEF) ADS Med    Comments:  Trixie Deis   : cabinet override      01/06/16 1824 01/07/16 0629   01/06/16 0600  cefUROXime (ZINACEF) 1.5 g in dextrose 5 % 50 mL IVPB     1.5 g 100 mL/hr over 30 Minutes Intravenous On call to O.R. 01/05/16 1035 01/06/16 1430   01/01/16 2200  cefUROXime (ZINACEF) 1.5 g in dextrose 5 % 50 mL IVPB     1.5 g 100 mL/hr over 30 Minutes Intravenous Every 12 hours 01/01/16 2154 01/02/16 1030   01/01/16 1736  dextrose 5 % with cefUROXime (ZINACEF) ADS Med    Comments:  Gershon Crane   : cabinet override      01/01/16 1736 01/02/16 0544   01/01/16 0552  cefUROXime (ZINACEF) 1.5 g in dextrose 5 % 50 mL IVPB     1.5 g 100 mL/hr over 30 Minutes  Intravenous 30 min pre-op 01/01/16 0552 01/01/16 0902      Assessment/Plan: s/p Procedure(s): AMPUTATION LEFT LEG  ABOVE KNEE Advance diet Chest tube to waterseal.  Clamp NGT Clear liquids.  LOS: 9 days   Kathryne Eriksson. Dahlia Bailiff, MD, FACS [phone] Trauma Surgeon 01/10/2016

## 2016-01-10 NOTE — Progress Notes (Signed)
Physical Therapy Treatment Patient Details Name: Raymond Jacobson MRN: [id number] DOB: 1940-11-18 Today's Date: 01/10/2016    History of Present Illness 75 y.o. male admitted to [facility] on 01/01/16 for L leg claudication symptoms.  He underwent L femoral endarterectomy and left femoral to above knee popliteal bypass on 01/01/16.  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  NGT placed due to post op ileus and incidental finding of R PTX, so R CT placed on 01/08/16.  Pt with significant PMHx of SOB, PVD, MI, HTN, DVT, and CAD.    PT Comments    Patient progressing well towards PT goals. Tolerated standing and SPT to [facility] and chair today with assist of 2 for lines/safety. Increased time to perform all mobility secondary to dyspnea on exertion and pain. Very motivated to mobilize. Reviewed exercises. Will attempt gait training next session as tolerated as pt planning to get some lines out over the weekend. Will follow.   Follow Up Recommendations  CIR     Equipment Recommendations  Rolling walker with 5" wheels;3in1 (PT);Wheelchair (measurements PT);Wheelchair cushion (measurements PT);Other (comment)    Recommendations for Other Services       Precautions / Restrictions Precautions Precautions: Fall Precaution Comments: chest tube, NG tube Restrictions Weight Bearing Restrictions: No    Mobility  Bed Mobility Overal bed mobility: Needs Assistance Bed Mobility: Supine to Sit     Supine to sit: Mod assist;+2 for physical assistance;+2 for safety/equipment;HOB elevated     General bed mobility comments: Increased time to perform, requires assist with scooting bottom and to move left hip/residual limb towards EOB. Able to pull up on rail but requires assist with trunk.   Transfers Overall transfer level: Needs assistance Equipment used: Rolling walker (2 wheeled) Transfers: Sit to/from [facility] Sit to Stand: Mod  assist;+2 physical assistance;+2 safety/equipment Stand pivot transfers: Min assist;+2 safety/equipment       General transfer comment: Assist of 2 to stand from EOB with cues for hand placement and technique. Stood from [facility] as well. SPT bed to [facility] and BSC to chair Min A for balance. Sp02 dropped to mid 80s on RA. Donned 02 and able to maintain in90s on 6L/min 02.  Ambulation/Gait                 Stairs            Wheelchair Mobility    Modified Rankin (Stroke Patients Only)       Balance Overall balance assessment: Needs assistance Sitting-balance support: Feet supported;Single extremity supported Sitting balance-Leahy Scale: Fair Sitting balance - Comments: Able to sit EOB with 1 UE support.    Standing balance support: During functional activity Standing balance-Leahy Scale: Poor Standing balance comment: Reliant on BUEs for support in standing as well as Min A for balance.                     Cognition Arousal/Alertness: Awake/alert Behavior During Therapy: WFL for tasks assessed/performed Overall Cognitive Status: Within Functional Limits for tasks assessed                      Exercises      General Comments General comments (skin integrity, edema, etc.): Reviewed no pilow under left residual limb to avoide hip flexion contractures.       Pertinent Vitals/Pain Pain Assessment: Faces Faces Pain Scale: Hurts little more Pain Location: right  CT site, left residual limb Pain Descriptors / Indicators: Grimacing;Guarding Pain Intervention(s): Monitored during session;Repositioned    Home Living                      Prior Function            PT Goals (current goals can now be found in the care plan section) Progress towards PT goals: Progressing toward goals    Frequency  Min 3X/week    PT Plan Current plan remains appropriate    Co-evaluation             End of Session Equipment Utilized During Treatment:  Gait belt;Oxygen Activity Tolerance: Patient limited by pain;Patient tolerated treatment well Patient left: in chair;with call bell/phone within reach;with family/visitor present     Time: 1053 (15 mins on BSC)-1147 PT Time Calculation (min) (ACUTE ONLY): 54 min  Charges:  $Therapeutic Activity: 38-52 mins                    G Codes:      Amare Bail A Kila Godina 01/10/2016, 12:07 PM Wray Kearns, [place], DPT [phone]

## 2016-01-11 ENCOUNTER — Inpatient Hospital Stay (HOSPITAL_COMMUNITY): Payer: No Typology Code available for payment source

## 2016-01-11 LAB — BASIC METABOLIC PANEL
Anion gap: 10 (ref 5–15)
BUN: 15 mg/dL (ref 6–20)
CHLORIDE: 92 mmol/L — AB (ref 101–111)
CO2: 33 mmol/L — ABNORMAL HIGH (ref 22–32)
Calcium: 8.3 mg/dL — ABNORMAL LOW (ref 8.9–10.3)
Creatinine, Ser: 0.89 mg/dL (ref 0.61–1.24)
GFR calc Af Amer: >60 mL/min (ref >60)
GFR calc non Af Amer: >60 mL/min (ref >60)
GLUCOSE: 126 mg/dL — AB (ref 65–99)
POTASSIUM: 3.2 mmol/L — AB (ref 3.5–5.1)
Sodium: 135 mmol/L (ref 135–145)

## 2016-01-11 LAB — GLUCOSE, CAPILLARY
GLUCOSE-CAPILLARY: 134 mg/dL — AB (ref 65–99)
GLUCOSE-CAPILLARY: 156 mg/dL — AB (ref 65–99)
GLUCOSE-CAPILLARY: 163 mg/dL — AB (ref 65–99)
GLUCOSE-CAPILLARY: 161 mg/dL — AB (ref 65–99)
Glucose-Capillary: 131 mg/dL — ABNORMAL HIGH (ref 65–99)
Glucose-Capillary: 127 mg/dL — ABNORMAL HIGH (ref 65–99)

## 2016-01-11 LAB — CBC
HEMATOCRIT: 30.4 % — AB (ref 39.0–52.0)
HEMOGLOBIN: 10 g/dL — AB (ref 13.0–17.0)
MCH: 28.8 pg (ref 26.0–34.0)
MCHC: 32.9 g/dL (ref 30.0–36.0)
MCV: 87.6 fL (ref 78.0–100.0)
Platelets: 130 10*3/uL — ABNORMAL LOW (ref 150–400)
RBC: 3.47 MIL/uL — ABNORMAL LOW (ref 4.22–5.81)
RDW: 14.6 % (ref 11.5–15.5)
WBC: 8.8 10*3/uL (ref 4.0–10.5)

## 2016-01-11 LAB — HEPARIN LEVEL (UNFRACTIONATED)
HEPARIN UNFRACTIONATED: 0.12 [IU]/mL — AB (ref 0.30–0.70)
Heparin Unfractionated: <0.1 IU/mL — ABNORMAL LOW (ref 0.30–0.70)
Heparin Unfractionated: <0.1 IU/mL — ABNORMAL LOW (ref 0.30–0.70)

## 2016-01-11 LAB — CALCIUM, IONIZED: CALCIUM, IONIZED, SERUM: 4.6 mg/dL (ref 4.5–5.6)

## 2016-01-11 MED ORDER — HEPARIN BOLUS VIA INFUSION
2000.0000 [IU] | Freq: Once | INTRAVENOUS | Status: DC
  Filled 2016-01-11: qty 2000

## 2016-01-11 MED ORDER — POTASSIUM CHLORIDE 10 MEQ/100ML IV SOLN
10.0000 meq | INTRAVENOUS | Status: AC
  Administered 2016-01-11 (×4): 10 meq via INTRAVENOUS
  Filled 2016-01-11 (×4): qty 100

## 2016-01-11 MED ORDER — POTASSIUM CHLORIDE 20 MEQ/15ML (10%) PO SOLN
40.0000 meq | Freq: Once | ORAL | Status: AC
  Administered 2016-01-11: 40 meq via ORAL
  Filled 2016-01-11: qty 30

## 2016-01-11 MED ORDER — GUAIFENESIN-CODEINE 100-10 MG/5ML PO SOLN
5.0000 mL | Freq: Four times a day (QID) | ORAL | Status: DC | PRN
  Administered 2016-01-11: 5 mL via ORAL
  Filled 2016-01-11: qty 5

## 2016-01-11 MED ORDER — POTASSIUM CHLORIDE 10 MEQ/100ML IV SOLN
10.0000 meq | INTRAVENOUS | Status: AC
  Administered 2016-01-11 (×3): 10 meq via INTRAVENOUS
  Filled 2016-01-11 (×3): qty 100

## 2016-01-11 MED ORDER — HEPARIN BOLUS VIA INFUSION
2500.0000 [IU] | Freq: Once | INTRAVENOUS | Status: AC
  Administered 2016-01-11: 2500 [IU] via INTRAVENOUS
  Filled 2016-01-11: qty 2500

## 2016-01-11 NOTE — Progress Notes (Signed)
4 Days Post-Op  Subjective: Pt had BM no abdominal pain  Breathing ok   Objective: Vital signs in last 24 hours: Temp:  [97.2 F (36.2 C)-98.7 F (37.1 C)] 98.5 F (36.9 C) (08/12 0743) Pulse Rate:  [79-102] 79 (08/12 0027) Resp:  [12-25] 15 (08/12 0743) BP: (94-132)/(58-78) 125/73 (08/12 0600) SpO2:  [92 %-100 %] 95 % (08/12 0743) Last BM Date: 01/10/16  Intake/Output from previous day: 08/11 0701 - 08/12 0700 In: 1499.2 [I.V.:669.2; NG/GT:30; IV Piggyback:800] Out: 2055 [Urine:1945; Chest Tube:110] Intake/Output this shift: No intake/output data recorded.  Resp: clear to auscultation bilaterally GI: distended but softt NT no rebound     Lab Results:   Recent Labs  01/10/16 0309 01/11/16 0526  WBC 9.0 8.8  HGB 10.8* 10.0*  HCT 32.7* 30.4*  PLT 133* 130*   BMET  Recent Labs  01/10/16 2041 01/11/16 0526  NA 135 135  K 3.1* 3.2*  CL 94* 92*  CO2 32 33*  GLUCOSE 122* 126*  BUN 15 15  CREATININE 1.09 0.89  CALCIUM 8.2* 8.3*   PT/INR No results for input(s): LABPROT, INR in the last 72 hours. ABG  Recent Labs  01/08/16 1950 01/09/16 1410  PHART 7.461* 7.474*  HCO3 26.1* 28.2*    Studies/Results: Ct Chest W Contrast  Result Date: 01/09/2016 CLINICAL DATA:  Right pneumothorax 01/08/2016 with subsequent chest tube placement. Increasing dyspnea and acute hypoxic respiratory failure. EXAM: CT CHEST WITH CONTRAST TECHNIQUE: Multidetector CT imaging of the chest was performed during intravenous contrast administration. CONTRAST:  75 ml ISOVUE-300 IOPAMIDOL (ISOVUE-300) INJECTION 61% COMPARISON:  Multiple prior plain films of the chest, last earlier today. FINDINGS: Cardiovascular: Embolus is seen at the bifurcation of the main pulmonary arteries with occlusive thrombus in the descending left interlobar pulmonary artery. No definite embolus is seen on the right. The RV to LV ratio is 1.2 compatible with right heart strain. Calcific coronary artery disease is  identified. Mediastinum/Nodes: No lymphadenopathy. Lungs/Pleura: Small right and tiny left pleural effusions are identified. A small focus of airspace disease is seen along the patient's right chest tube likely reflecting some hemorrhage. Patchy airspace disease is also seen in the posterior right upper lobe lobe. Mild compressive atelectasis is identified in the right base. Minimal atelectasis is seen on the left. No residual pneumothorax. Upper Abdomen: Marked gaseous distention of colon is identified. There appears to be some sludge or small stones in the gallbladder versus vicarious excretion of contrast. Trace amount of upper abdominal ascites is identified. Musculoskeletal: No fracture or worrisome marrow lesion. IMPRESSION: Positive for acute PE with CT evidence of right heart strain (RV/LV Ratio = 1.2) consistent with at least submassive (intermediate risk) PE. The presence of right heart strain has been associated with an increased risk of morbidity and mortality. Please activate Code PE by paging [phone]. Negative for pneumothorax with a right chest tube in place. Small right and trace left pleural effusions. Patchy airspace opacity in the posterior left upper lobe could be due to pneumonia, aspiration or atelectasis. Partial visualization gaseous distention of bowel in the upper abdomen likely due date ileus. Calcific coronary artery disease. Critical Value/emergent results were called by telephone at the time of interpretation on 01/09/2016 at 2:43 pm to Dr. Ruta Hinds, who verbally acknowledged these results. Electronically Signed   By: Inge Rise M.D.   On: 01/09/2016 14:44   Dg Chest Port 1 View  Result Date: 01/10/2016 CLINICAL DATA:  Pneumothorax.  Chest tube. EXAM: PORTABLE CHEST 1  VIEW COMPARISON:  CT 01/09/2016.  Chest x-ray 03/2016. FINDINGS: Right chest tube in stable position. No pneumothorax. NG tube in stable position. Stable cardiomegaly. Persistent right base atelectasis  and or infiltrate. No pleural effusion. IMPRESSION: 1. Right chest tube and NG tube in stable position. No pneumothorax. 2. Persistent right base atelectasis and or infiltrate. No interim change. 3. Stable cardiomegaly.  No pulmonary venous congestion. Electronically Signed   By: Marcello Moores  Register   On: 01/10/2016 07:03   Dg Abd 2 Views  Result Date: 01/10/2016 CLINICAL DATA:  75 year old male with recent pneumothorax treated with chest tube, and pulmonary embolus diagnosed yesterday by CTA. Recent abdominal pain. Initial encounter. EXAM: ABDOMEN - 2 VIEW COMPARISON:  Abdominal radiographs 01/09/2016 and earlier FINDINGS: Supine and left-side-down lateral decubitus views of the abdomen. Diffuse gas distended large bowel loops, with redundant colon. Air-fluid levels in the large bowel. Gas-filled but nondilated small bowel in the central abdomen. Gas distention of small bowel loops has regressed since 01/08/2016. Distension of large intestine is stable since yesterday. Bowel gas is present to the rectum. Enteric tube in the left upper quadrant appears to be looped in the stomach. Right lower chest tube partially visible. No pneumoperitoneum identified. The right lower lung appears inflated. No acute osseous abnormality identified. IMPRESSION: 1. Diffuse gas distended large bowel stable since yesterday, favor ileus regression of more mildly gas distended small bowel loops since 01/08/2016. 2. No free air identified. 3. Enteric tube looped in the region of the stomach. Partially visible right chest tube. Electronically Signed   By: Genevie Ann M.D.   On: 01/10/2016 21:33   Dg Abd Portable 1v  Result Date: 01/09/2016 CLINICAL DATA:  Abdominal distension.  Hypoxemia.  Recent left BKA. EXAM: PORTABLE ABDOMEN - 1 VIEW COMPARISON:  01/08/2016 abdominal radiograph FINDINGS: Enteric tube terminates in the body of the stomach. Gaseous distention of the stomach is significantly decreased. There is mild gaseous distention of  small bowel loops throughout the abdomen, slightly decreased. There is moderate gaseous distention of the entire colon, not appreciably changed. No evidence of pneumatosis or pneumoperitoneum. No pathologic soft tissue calcifications. Moderate thoracolumbar spondylosis. IMPRESSION: 1. Enteric tube terminates in the body of the stomach. Significantly decreased gaseous distention of the stomach . 2. Improving adynamic ileus with decreased mild diffuse small bowel dilatation and stable moderate diffuse colonic dilatation. Electronically Signed   By: Ilona Sorrel M.D.   On: 01/09/2016 13:36    Anti-infectives: Anti-infectives    Start     Dose/Rate Route Frequency Ordered Stop   01/09/16 0930  vancomycin (VANCOCIN) IVPB 1000 mg/200 mL premix  Status:  Discontinued     1,000 mg 200 mL/hr over 60 Minutes Intravenous Every 12 hours 01/08/16 2121 01/10/16 1119   01/08/16 2200  piperacillin-tazobactam (ZOSYN) IVPB 3.375 g  Status:  Discontinued     3.375 g 12.5 mL/hr over 240 Minutes Intravenous Every 8 hours 01/08/16 2121 01/10/16 1119   01/08/16 2130  vancomycin (VANCOCIN) 1,500 mg in sodium chloride 0.9 % 500 mL IVPB     1,500 mg 250 mL/hr over 120 Minutes Intravenous  Once 01/08/16 2121 01/09/16 0030   01/07/16 1800  cefUROXime (ZINACEF) 1.5 g in dextrose 5 % 50 mL IVPB  Status:  Discontinued     1.5 g 100 mL/hr over 30 Minutes Intravenous Every 12 hours 01/07/16 1743 01/08/16 1124   01/07/16 1430  cefUROXime (ZINACEF) 1.5 g in dextrose 5 % 50 mL IVPB     1.5 g  100 mL/hr over 30 Minutes Intravenous  Once 01/07/16 1425 01/07/16 1409   01/07/16 1350  dextrose 5 % with cefUROXime (ZINACEF) ADS Med    Comments:  Kerrie Pleasure   : cabinet override      01/07/16 1350 01/08/16 0159   01/06/16 2151  dextrose 5 % with cefUROXime (ZINACEF) ADS Med    Comments:  Haynes Bast   : cabinet override      01/06/16 2151 01/06/16 2200   01/06/16 1824  dextrose 5 % with cefUROXime (ZINACEF) ADS Med     Comments:  Trixie Deis   : cabinet override      01/06/16 1824 01/07/16 0629   01/06/16 0600  cefUROXime (ZINACEF) 1.5 g in dextrose 5 % 50 mL IVPB     1.5 g 100 mL/hr over 30 Minutes Intravenous On call to O.R. 01/05/16 1035 01/06/16 1430   01/01/16 2200  cefUROXime (ZINACEF) 1.5 g in dextrose 5 % 50 mL IVPB     1.5 g 100 mL/hr over 30 Minutes Intravenous Every 12 hours 01/01/16 2154 01/02/16 1030   01/01/16 1736  dextrose 5 % with cefUROXime (ZINACEF) ADS Med    Comments:  Gershon Crane   : cabinet override      01/01/16 1736 01/02/16 0544   01/01/16 0552  cefUROXime (ZINACEF) 1.5 g in dextrose 5 % 50 mL IVPB     1.5 g 100 mL/hr over 30 Minutes Intravenous 30 min pre-op 01/01/16 0552 01/01/16 0902      Assessment/Plan: Ileus improved   Had BM.  remove NGT and advance diet   Right PTX  No air leak and lung expanded   Had 110 cc out of tube  On water seal  Remove Sunday if drainage is less   LOS: 10 days    Raymond Jacobson A. 01/11/2016

## 2016-01-11 NOTE — Progress Notes (Signed)
  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  CBC    Component Value Date/Time   WBC 8.8 01/11/2016 0526   RBC 3.47 (L) 01/11/2016 0526   HGB 10.0 (L) 01/11/2016 0526   HCT 30.4 (L) 01/11/2016 0526   PLT 130 (L) 01/11/2016 0526   MCV 87.6 01/11/2016 0526   MCH 28.8 01/11/2016 0526   MCHC 32.9 01/11/2016 0526   RDW 14.6 01/11/2016 0526   LYMPHSABS 1.2 10/01/2015 0901   MONOABS 0.4 10/01/2015 0901   EOSABS 0.1 10/01/2015 0901   BASOSABS 0.1 10/01/2015 0901    BMET    Component Value Date/Time   NA 135 01/11/2016 0526   K 3.2 (L) 01/11/2016 0526   CL 92 (L) 01/11/2016 0526   CO2 33 (H) 01/11/2016 0526   GLUCOSE 126 (H) 01/11/2016 0526   BUN 15 01/11/2016 0526   CREATININE 0.89 01/11/2016 0526   CALCIUM 8.3 (L) 01/11/2016 0526   GFRNONAA >60 01/11/2016 0526   GFRAA >60 01/11/2016 0526    INR    Component Value Date/Time   INR 1.00 01/01/2016 0705     Intake/Output Summary (Last 24 hours) at 01/11/16 0948 Last data filed at 01/11/16 0600  Gross per 24 hour  Intake          1338.75 ml  Output             1945 ml  Net          -606.25 ml     Assessment:  75 y.o. male s/p Left aka following failed bypass, now with resolving ileus and chest tube managed by general surgery for tension pneumothorax and PE previously on pradaxa for afib  Plan: -continue hep gtt, will transition back to pradaxa following chest tube removal possible Sunday -ng out per surgery today and will advance diet -electrolyte replacement ongoing -aka wound site progressing well -DVT prophylaxis:  On therapeutic heparin   Maison Agrusa C. Donzetta Matters, MD [facility] Office:  [phone] Pager: [phone]  01/11/2016 9:48 AM

## 2016-01-11 NOTE — Progress Notes (Signed)
eLink Physician-Brief Progress Note Patient Name: Juliun Brandel DOB: 25-Jun-1940 MRN: [id number]   Date of Service  01/11/2016  HPI/Events of Note    eICU Interventions  Hypokalemia -repleted Codeine cough syrup Dc NTG patch - can take PO     Intervention Category Minor Interventions: Electrolytes abnormality - evaluation and management  Diasha Castleman V. 01/11/2016, 12:15 AM

## 2016-01-11 NOTE — Progress Notes (Signed)
ANTICOAGULATION CONSULT NOTE   Pharmacy Consult for Heparin Indication: atrial fibrillation + occluded L leg bypass s/p AKA 8/8 + PE  Patient Measurements: Height: 5\' 8"  (172.7 cm) Weight: 194 lb (88 kg) IBW/kg (Calculated) : 68.4 Heparin Dosing Weight: 86.5 kg  Vital Signs: Temp: 98 F (36.7 C) (08/12 0027) Temp Source: Axillary (08/12 0027) BP: 119/63 (08/12 0027) Pulse Rate: 79 (08/12 0027)  Labs:  Recent Labs  01/08/16 2050 01/09/16 0202 01/09/16 0604 01/09/16 1528 01/10/16 0309 01/10/16 1525 01/10/16 2041 01/11/16 0133  HGB 10.3* 10.6*  --   --  10.8*  --   --   --   HCT 31.3* 32.0*  --   --  32.7*  --   --   --   PLT 184 160  --   --  133*  --   --   --   HEPARINUNFRC 0.42  --  0.46  --  0.37 <0.10*  --  <0.10*  CREATININE 1.14 1.14  --  1.01 0.93  --  1.09  --   CKTOTAL 341  --   --   --   --   --   --   --   CKMB 5.1*  --   --   --   --   --   --   --   TROPONINI <0.03 0.03* 0.03*  --   --   --   --   --     Estimated Creatinine Clearance: 63.1 mL/min (by C-G formula based on SCr of 1.09 mg/dL).   Assessment: 75 yo male with h/o AFib, Pradaxa PTA on hold.  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  Per critical care note on 8/10 - would prefer to keep him at the higher end of therapeutic range. Heparin level remains undetectable on 1850 units/hr. Last 2 levels undetectable after being detectable for >24 hrs prior. No issues with line or bleeding reported per RN.  Goal of Therapy:  Heparin level 0.3-0.7 units/ml Monitor platelets by anticoagulation protocol: Yes   Plan:  Increase heparin gtt to 2200 units/hr Check 8 hr HL  Sherlon Handing, PharmD, BCPS Clinical pharmacist, pager [phone] 01/11/2016 3:01 AM

## 2016-01-11 NOTE — Progress Notes (Signed)
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 + new PE  Patient Measurements: Height: 5\' 8"  (172.7 cm) Weight: 194 lb (88 kg) IBW/kg (Calculated) : 68.4 Heparin Dosing Weight: 86.5 kg  Vital Signs: Temp: 98.4 F (36.9 C) (08/12 2000) Temp Source: Oral (08/12 2000) BP: 145/78 (08/12 1655) Pulse Rate: 111 (08/12 1204)  Labs:  Recent Labs  01/09/16 0202  01/09/16 0604  01/10/16 0309  01/10/16 2041 01/11/16 0133 01/11/16 0526 01/11/16 1026 01/11/16 2045  HGB 10.6*  --   --   --  10.8*  --   --   --  10.0*  --   --   HCT 32.0*  --   --   --  32.7*  --   --   --  30.4*  --   --   PLT 160  --   --   --  133*  --   --   --  130*  --   --   HEPARINUNFRC  --   < > 0.46  --  0.37  < >  --  <0.10*  --  <0.10* 0.12*  CREATININE 1.14  --   --   < > 0.93  --  1.09  --  0.89  --   --   TROPONINI 0.03*  --  0.03*  --   --   --   --   --   --   --   --   < > = values in this interval not displayed.  Estimated Creatinine Clearance: 77.3 mL/min (by C-G formula based on SCr of 0.89 mg/dL).   Assessment: 75 yo male with h/o AFib, Pradaxa PTA on hold.  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  Per critical care note on 8/10 - would prefer to keep him at the higher end of therapeutic range. - 6h Heparin level = 0.12, has increased but remains SUBtherapeutic after heparin 2500 units bolus and heparin rate was increased to 2400 units/hr earlier today. This patient was therapeutic on lower doses last week.  Could be large clot burden needing more heparin. Left CBC low/stable.  S/p left AKA on 01/07/16 -RN reports small amount blood oozing at left groin site.    Goal of Therapy:  Heparin level 0.3-0.7 units/ml Monitor platelets by anticoagulation protocol: Yes   Plan:  Heparin bolus 2000 uts iv x1 Increase heparin gtt to 2600 units/hr Check 6 hr HL  (Per critical care note on 8/10 - would prefer to keep him at the higher end of therapeutic range.    Nicole Cella, RPh Clinical Pharmacist Pager: [phone] 01/11/2016 9:16 PM

## 2016-01-11 NOTE — Progress Notes (Signed)
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 + new PE  Patient Measurements: Height: 5\' 8"  (172.7 cm) Weight: 194 lb (88 kg) IBW/kg (Calculated) : 68.4 Heparin Dosing Weight: 86.5 kg  Vital Signs: Temp: 97.5 F (36.4 C) (08/12 1204) Temp Source: Oral (08/12 1204) BP: 141/89 (08/12 1400) Pulse Rate: 111 (08/12 1204)  Labs:  Recent Labs  01/08/16 2050 01/09/16 0202 01/09/16 0604  01/10/16 0309 01/10/16 1525 01/10/16 2041 01/11/16 0133 01/11/16 0526 01/11/16 1026  HGB 10.3* 10.6*  --   --  10.8*  --   --   --  10.0*  --   HCT 31.3* 32.0*  --   --  32.7*  --   --   --  30.4*  --   PLT 184 160  --   --  133*  --   --   --  130*  --   HEPARINUNFRC 0.42  --  0.46  --  0.37 <0.10*  --  <0.10*  --  <0.10*  CREATININE 1.14 1.14  --   < > 0.93  --  1.09  --  0.89  --   CKTOTAL 341  --   --   --   --   --   --   --   --   --   CKMB 5.1*  --   --   --   --   --   --   --   --   --   TROPONINI <0.03 0.03* 0.03*  --   --   --   --   --   --   --   < > = values in this interval not displayed.  Estimated Creatinine Clearance: 77.3 mL/min (by C-G formula based on SCr of 0.89 mg/dL).   Assessment: 75 yo male with h/o AFib, Pradaxa PTA on hold.  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  Per critical care note on 8/10 - would prefer to keep him at the higher end of therapeutic range. Heparin level remains undetectable on 2200 units/hr. Last 2 levels undetectable after being detectable for >24 hrs prior however he was therapeutic on lower doses last week.  I checked line with RN infusing ok.  Could be lasrge clot burden needing more heparin.  No bleeding noted.  CBC stable.  Per patient SOB improved  Goal of Therapy:  Heparin level 0.3-0.7 units/ml Monitor platelets by anticoagulation protocol: Yes   Plan:  Heparin bolus 2500 uts iv  x1 Increase heparin gtt to 2400 units/hr Check 6 hr HL  Bonnita Nasuti Pharm.D. CPP, BCPS Clinical Pharmacist [phone] 01/11/2016 2:57 PM

## 2016-01-11 NOTE — Progress Notes (Signed)
eLink Physician-Brief Progress Note Patient Name: Raymond Jacobson DOB: 04-07-1941 MRN: [id number]   Date of Service  01/11/2016  HPI/Events of Note    eICU Interventions  Hypokalemia -repleted      Intervention Category Intermediate Interventions: Electrolyte abnormality - evaluation and management  Zebulon Gantt V. 01/11/2016, 6:54 AM

## 2016-01-12 ENCOUNTER — Inpatient Hospital Stay (HOSPITAL_COMMUNITY): Payer: No Typology Code available for payment source

## 2016-01-12 DIAGNOSIS — J9601 Acute respiratory failure with hypoxia: Secondary | ICD-10-CM

## 2016-01-12 DIAGNOSIS — I2609 Other pulmonary embolism with acute cor pulmonale: Secondary | ICD-10-CM

## 2016-01-12 LAB — CBC
HEMATOCRIT: 31.5 % — AB (ref 39.0–52.0)
Hemoglobin: 10.4 g/dL — ABNORMAL LOW (ref 13.0–17.0)
MCH: 28.9 pg (ref 26.0–34.0)
MCHC: 33 g/dL (ref 30.0–36.0)
MCV: 87.5 fL (ref 78.0–100.0)
PLATELETS: 144 10*3/uL — AB (ref 150–400)
RBC: 3.6 MIL/uL — AB (ref 4.22–5.81)
RDW: 14.4 % (ref 11.5–15.5)
WBC: 13.2 10*3/uL — AB (ref 4.0–10.5)

## 2016-01-12 LAB — BASIC METABOLIC PANEL
Anion gap: 15 (ref 5–15)
BUN: 23 mg/dL — AB (ref 6–20)
CO2: 29 mmol/L (ref 22–32)
CREATININE: 1.2 mg/dL (ref 0.61–1.24)
Calcium: 8.4 mg/dL — ABNORMAL LOW (ref 8.9–10.3)
Chloride: 91 mmol/L — ABNORMAL LOW (ref 101–111)
GFR, EST NON AFRICAN AMERICAN: 57 mL/min — AB (ref >60)
Glucose, Bld: 151 mg/dL — ABNORMAL HIGH (ref 65–99)
POTASSIUM: 3.1 mmol/L — AB (ref 3.5–5.1)
Sodium: 135 mmol/L (ref 135–145)

## 2016-01-12 LAB — GLUCOSE, CAPILLARY
GLUCOSE-CAPILLARY: 124 mg/dL — AB (ref 65–99)
GLUCOSE-CAPILLARY: 137 mg/dL — AB (ref 65–99)
GLUCOSE-CAPILLARY: 137 mg/dL — AB (ref 65–99)
GLUCOSE-CAPILLARY: 177 mg/dL — AB (ref 65–99)
Glucose-Capillary: 139 mg/dL — ABNORMAL HIGH (ref 65–99)
Glucose-Capillary: 168 mg/dL — ABNORMAL HIGH (ref 65–99)
Glucose-Capillary: 189 mg/dL — ABNORMAL HIGH (ref 65–99)

## 2016-01-12 LAB — HEPARIN LEVEL (UNFRACTIONATED)
Heparin Unfractionated: 0.13 IU/mL — ABNORMAL LOW (ref 0.30–0.70)
Heparin Unfractionated: 0.38 IU/mL (ref 0.30–0.70)

## 2016-01-12 MED ORDER — POTASSIUM CHLORIDE 20 MEQ PO PACK
40.0000 meq | PACK | Freq: Once | ORAL | Status: DC
  Filled 2016-01-12: qty 2

## 2016-01-12 MED ORDER — POTASSIUM CHLORIDE CRYS ER 20 MEQ PO TBCR
40.0000 meq | EXTENDED_RELEASE_TABLET | Freq: Once | ORAL | Status: AC
  Administered 2016-01-12: 40 meq via ORAL
  Filled 2016-01-12: qty 2

## 2016-01-12 MED ORDER — PANTOPRAZOLE SODIUM 40 MG PO TBEC
40.0000 mg | DELAYED_RELEASE_TABLET | Freq: Every day | ORAL | Status: DC
  Administered 2016-01-13 – 2016-01-17 (×5): 40 mg via ORAL
  Filled 2016-01-12 (×5): qty 1

## 2016-01-12 NOTE — Progress Notes (Signed)
5 Days Post-Op  Subjective: Having bms, passing flatus, no complaints  Objective: Vital signs in last 24 hours: Temp:  [97.5 F (36.4 C)-98.5 F (36.9 C)] 98.4 F (36.9 C) (08/12 2000) Pulse Rate:  [111] 111 (08/12 1204) Resp:  [15-25] 25 (08/13 0600) BP: (98-145)/(72-89) 124/72 (08/13 0600) SpO2:  [90 %-100 %] 100 % (08/13 0600) Last BM Date: 01/10/16  Intake/Output from previous day: 08/12 0701 - 08/13 0700 In: 1109.1 [I.V.:709.1; IV Piggyback:400] Out: 781 [Urine:775; Stool:6] Intake/Output this shift: No intake/output data recorded.  Resp: clear to auscultation bilaterally Cardio: regular rate and rhythm GI: soft bs present nt  Lab Results:   Recent Labs  01/11/16 0526 01/12/16 0621  WBC 8.8 13.2*  HGB 10.0* 10.4*  HCT 30.4* 31.5*  PLT 130* 144*   BMET  Recent Labs  01/10/16 2041 01/11/16 0526  NA 135 135  K 3.1* 3.2*  CL 94* 92*  CO2 32 33*  GLUCOSE 122* 126*  BUN 15 15  CREATININE 1.09 0.89  CALCIUM 8.2* 8.3*   PT/INR No results for input(s): LABPROT, INR in the last 72 hours. ABG  Recent Labs  01/09/16 1410  PHART 7.474*  HCO3 28.2*    Studies/Results: Dg Chest Port 1 View  Result Date: 01/12/2016 CLINICAL DATA:  Chest tube EXAM: PORTABLE CHEST 1 VIEW COMPARISON:  01/11/2016 FINDINGS: Right chest tube unchanged in position. No pneumothorax on today's study. Mild right lower lobe atelectasis has improved. Negative for heart failure or effusion. NG tube removed. IMPRESSION: Negative for pneumothorax Improvement in right lower lobe atelectasis. Electronically Signed   By: Franchot Gallo M.D.   On: 01/12/2016 07:33   Dg Chest Port 1 View  Result Date: 01/11/2016 CLINICAL DATA:  Patient with cough.  Respiratory failure. EXAM: PORTABLE CHEST 1 VIEW COMPARISON:  Chest radiograph 01/10/2016. FINDINGS: Enteric tube courses inferior to the diaphragm. Right chest tube stable in position. Monitoring leads overlie the patient. Low lung volumes. Stable  cardiac and mediastinal contours. Grossly unchanged heterogeneous opacities right mid and lower lung. Possible tiny right apical pneumothorax. IMPRESSION: Findings suggestive of tiny right apical pneumothorax. Right chest tube in place. Persistent consolidation within the right lower lung. Critical Value/emergent results were called by telephone at the time of interpretation on 01/11/2016 at 8:18 am to Nurse Council Mechanic , who verbally acknowledged these results. Electronically Signed   By: Lovey Newcomer M.D.   On: 01/11/2016 08:20   Dg Abd 2 Views  Result Date: 01/10/2016 CLINICAL DATA:  75 year old male with recent pneumothorax treated with chest tube, and pulmonary embolus diagnosed yesterday by CTA. Recent abdominal pain. Initial encounter. EXAM: ABDOMEN - 2 VIEW COMPARISON:  Abdominal radiographs 01/09/2016 and earlier FINDINGS: Supine and left-side-down lateral decubitus views of the abdomen. Diffuse gas distended large bowel loops, with redundant colon. Air-fluid levels in the large bowel. Gas-filled but nondilated small bowel in the central abdomen. Gas distention of small bowel loops has regressed since 01/08/2016. Distension of large intestine is stable since yesterday. Bowel gas is present to the rectum. Enteric tube in the left upper quadrant appears to be looped in the stomach. Right lower chest tube partially visible. No pneumoperitoneum identified. The right lower lung appears inflated. No acute osseous abnormality identified. IMPRESSION: 1. Diffuse gas distended large bowel stable since yesterday, favor ileus regression of more mildly gas distended small bowel loops since 01/08/2016. 2. No free air identified. 3. Enteric tube looped in the region of the stomach. Partially visible right chest tube. Electronically Signed  By: Genevie Ann M.D.   On: 01/10/2016 21:33    Anti-infectives: Anti-infectives    Start     Dose/Rate Route Frequency Ordered Stop   01/09/16 0930  vancomycin (VANCOCIN) IVPB 1000  mg/200 mL premix  Status:  Discontinued     1,000 mg 200 mL/hr over 60 Minutes Intravenous Every 12 hours 01/08/16 2121 01/10/16 1119   01/08/16 2200  piperacillin-tazobactam (ZOSYN) IVPB 3.375 g  Status:  Discontinued     3.375 g 12.5 mL/hr over 240 Minutes Intravenous Every 8 hours 01/08/16 2121 01/10/16 1119   01/08/16 2130  vancomycin (VANCOCIN) 1,500 mg in sodium chloride 0.9 % 500 mL IVPB     1,500 mg 250 mL/hr over 120 Minutes Intravenous  Once 01/08/16 2121 01/09/16 0030   01/07/16 1800  cefUROXime (ZINACEF) 1.5 g in dextrose 5 % 50 mL IVPB  Status:  Discontinued     1.5 g 100 mL/hr over 30 Minutes Intravenous Every 12 hours 01/07/16 1743 01/08/16 1124   01/07/16 1430  cefUROXime (ZINACEF) 1.5 g in dextrose 5 % 50 mL IVPB     1.5 g 100 mL/hr over 30 Minutes Intravenous  Once 01/07/16 1425 01/07/16 1409   01/07/16 1350  dextrose 5 % with cefUROXime (ZINACEF) ADS Med    Comments:  Kerrie Pleasure   : cabinet override      01/07/16 1350 01/08/16 0159   01/06/16 2151  dextrose 5 % with cefUROXime (ZINACEF) ADS Med    Comments:  Haynes Bast   : cabinet override      01/06/16 2151 01/06/16 2200   01/06/16 1824  dextrose 5 % with cefUROXime (ZINACEF) ADS Med    Comments:  Trixie Deis   : cabinet override      01/06/16 1824 01/07/16 0629   01/06/16 0600  cefUROXime (ZINACEF) 1.5 g in dextrose 5 % 50 mL IVPB     1.5 g 100 mL/hr over 30 Minutes Intravenous On call to O.R. 01/05/16 1035 01/06/16 1430   01/01/16 2200  cefUROXime (ZINACEF) 1.5 g in dextrose 5 % 50 mL IVPB     1.5 g 100 mL/hr over 30 Minutes Intravenous Every 12 hours 01/01/16 2154 01/02/16 1030   01/01/16 1736  dextrose 5 % with cefUROXime (ZINACEF) ADS Med    Comments:  Gershon Crane   : cabinet override      01/01/16 1736 01/02/16 0544   01/01/16 0552  cefUROXime (ZINACEF) 1.5 g in dextrose 5 % 50 mL IVPB     1.5 g 100 mL/hr over 30 Minutes Intravenous 30 min pre-op 01/01/16 0552 01/01/16 0902       Assessment/Plan: 1. Ileus resolved, ng out, having bowel function adat 2. Right ptx resolved, no air leak, cxr looks good, doesn't appear much drainage, remove today Please call back if needed  [facility] 01/12/2016

## 2016-01-12 NOTE — Progress Notes (Signed)
PULMONARY / CRITICAL CARE MEDICINE   Name: Raymond Jacobson MRN: [id number] DOB: 1941-02-19    ADMISSION DATE:  01/01/2016 CONSULTATION DATE:  01/08/2016  REFERRING MD:  Dr. Oneida Alar, [facility]  CHIEF COMPLAINT:  Dyspnea  SUBJECTIVE:  Sore at chest tube site.  Having bowel movements.  Leg pain better.  Wife concerned about urine color.  VITAL SIGNS: BP 111/72   Pulse (!) 111   Temp 98.3 F (36.8 C)   Resp 17   Ht 5\' 8"  (1.727 m)   Wt 194 lb (88 kg)   SpO2 100%   BMI 29.50 kg/m   INTAKE / OUTPUT: I/O last 3 completed shifts: In: 2013.8 [I.V.:1313.8; IV Piggyback:700] Out: 2936 [Urine:2820; Stool:6; Chest Tube:110]  PHYSICAL EXAMINATION: General: pleasant Neuro:  Alert, normal strength HEENT:  No stridor Cardiovascular:  Regular, no murmur Lungs:  No wheeze, decreased BS Rt base Abdomen:  Soft, + bowel sounds Musculoskeletal:  Lt AKA stump site clean Skin:  No rashes  LABS:  BMET  Recent Labs Lab 01/10/16 2041 01/11/16 0526 01/12/16 0621  NA 135 135 135  K 3.1* 3.2* 3.1*  CL 94* 92* 91*  CO2 32 33* 29  BUN 15 15 23*  CREATININE 1.09 0.89 1.20  GLUCOSE 122* 126* 151*    Electrolytes  Recent Labs Lab 01/09/16 1654 01/10/16 0309 01/10/16 2041 01/11/16 0526 01/12/16 0621  CALCIUM  --  8.3* 8.2* 8.3* 8.4*  MG 1.6* 1.9  --   --   --   PHOS 2.0* 2.5  --   --   --     CBC  Recent Labs Lab 01/10/16 0309 01/11/16 0526 01/12/16 0621  WBC 9.0 8.8 13.2*  HGB 10.8* 10.0* 10.4*  HCT 32.7* 30.4* 31.5*  PLT 133* 130* 144*    Coag's  Recent Labs Lab 01/07/16 0040  APTT 87*    Sepsis Markers No results for input(s): LATICACIDVEN, PROCALCITON, O2SATVEN in the last 168 hours.  ABG  Recent Labs Lab 01/08/16 1950 01/09/16 1410  PHART 7.461* 7.474*  PCO2ART 37.1 38.8  PO2ART 88.0 86.8    Liver Enzymes No results for input(s): AST, ALT, ALKPHOS, BILITOT, ALBUMIN in the last 168 hours.  Cardiac Enzymes  Recent Labs Lab 01/08/16 2050  01/09/16 0202 01/09/16 0604  TROPONINI <0.03 0.03* 0.03*    Glucose  Recent Labs Lab 01/11/16 1203 01/11/16 1653 01/11/16 2050 01/12/16 0021 01/12/16 0442 01/12/16 0748  GLUCAP 163* 161* 127* 177* 137* 139*    Imaging Dg Chest Port 1 View  Result Date: 01/12/2016 CLINICAL DATA:  Chest tube EXAM: PORTABLE CHEST 1 VIEW COMPARISON:  01/11/2016 FINDINGS: Right chest tube unchanged in position. No pneumothorax on today's study. Mild right lower lobe atelectasis has improved. Negative for heart failure or effusion. NG tube removed. IMPRESSION: Negative for pneumothorax Improvement in right lower lobe atelectasis. Electronically Signed   By: Franchot Gallo M.D.   On: 01/12/2016 07:33     STUDIES:  8/10 CT chest >> PE at bifurcation of main PA, RV:LV ratio 1.2, coronary calcification 8/10 Doppler legs b/l >> no DVT 8/11 Echo >> EF 55 to [id number], grade 1 diastolic CHF, PAS 42 mmHg  CULTURES: 8/09 Urine >> negative  ANTIBIOTICS:  SIGNIFICANT EVENTS: 8/02 Admit, Lt femoral to above knee popliteal bypass, and Lt femoral endarterectomy; thrombosis of bypass >> back to OR for thrombectomy 8/07 Occluded fem-pop vein >> back to OR 8/08 Lt AKA 8/09 Rt PTX >> Chest tube by CCS  LINES/TUBES: 8/09 Rt chest tube >>  DISCUSSION: 75 yo male with claudication and had Lt femoral to above knee popliteal bypass, and Lt femoral endarterectomy 8/02.  Had thrombosis of bypass with persistent leg pain, and required Lt AKA 8/08.  Developed Rt PTX, dyspnea, and hypoxia.  CT chest showed PE.  ASSESSMENT / PLAN:  Acute hypoxic respiratory failure 2nd to acute PE, Rt PTX, Rt pleural effusion. - can likely transition to oral anticoagulation >> defer timing to VVS - chest tube management per CCS >> plan to d/c 8/13 - oxygen to keep SpO2 > 92% - negative fluid balance as tolerated >> continue lasix  Claudication s/p Lt AKA. - post-op care per VVS  Hx of CAD, HTN, HLD. - per VVS  Hyperglycemia. -  SSI  Ileus >> resolved. - advance diet per VVS and CCS  Updated wife at bedside.  PCCM will sign off.  Please call if additional help needed.   Chesley Mires, MD [facility] Pulmonary/Critical Care 01/12/2016, 9:11 AM Pager:  [phone] After 3pm call: [phone]

## 2016-01-12 NOTE — Progress Notes (Signed)
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 + new PE  Patient Measurements: Height: 5\' 8"  (172.7 cm) Weight: 194 lb (88 kg) IBW/kg (Calculated) : 68.4 Heparin Dosing Weight: 86.5 kg  Vital Signs: Temp: 98.3 F (36.8 C) (08/13 0700) BP: 111/72 (08/13 0700)  Labs:  Recent Labs  01/10/16 0309  01/10/16 2041  01/11/16 0526 01/11/16 1026 01/11/16 2045 01/12/16 0621  HGB 10.8*  --   --   --  10.0*  --   --  10.4*  HCT 32.7*  --   --   --  30.4*  --   --  31.5*  PLT 133*  --   --   --  130*  --   --  144*  HEPARINUNFRC 0.37  < >  --   < >  --  <0.10* 0.12* 0.13*  CREATININE 0.93  --  1.09  --  0.89  --   --  1.20  < > = values in this interval not displayed.  Estimated Creatinine Clearance: 57.3 mL/min (by C-G formula based on SCr of 1.2 mg/dL).   Assessment: 75 yo male with h/o AFib, Pradaxa PTA on hold.  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  Per critical care note on 8/10 - would prefer to keep him at the higher end of therapeutic range but struggle getting him therapeutic over weekend - requiring much more heparin than last week and HL still low - Also having new bleeding in last 24hr Both hematuria and oozing from groin surgical site - CBC stable Heparin level remains low 0.13 on 2600 units/hr.  I checked line with RN infusing ok.  Could be lasrge clot burden needing more heparin. Wil not bolus with new bleeding but will increase drip rate.  Goal of Therapy:  Heparin level 0.3-0.7 units/ml Monitor platelets by anticoagulation protocol: Yes   Plan:  Increase heparin gtt to 2800 units/hr Check 6 hr HL  Bonnita Nasuti Pharm.D. CPP, BCPS Clinical Pharmacist [phone] 01/12/2016 11:18 AM

## 2016-01-12 NOTE — Progress Notes (Signed)
  Progress Note    01/12/2016 1:09 PM 5 Days Post-Op  Subjective:  Doing well this a.m., only pain at chest tube site, had multiple liquid bms  Vitals:   01/12/16 1000 01/12/16 1142  BP: 112/64   Pulse:    Resp: 16 20  Temp:  98.3 F (36.8 C)    Physical Exam: Abdomen remains mildly distended Chest tube: no air leak Left AKA in tact with staples  CBC    Component Value Date/Time   WBC 13.2 (H) 01/12/2016 0621   RBC 3.60 (L) 01/12/2016 0621   HGB 10.4 (L) 01/12/2016 0621   HCT 31.5 (L) 01/12/2016 0621   PLT 144 (L) 01/12/2016 0621   MCV 87.5 01/12/2016 0621   MCH 28.9 01/12/2016 0621   MCHC 33.0 01/12/2016 0621   RDW 14.4 01/12/2016 0621   LYMPHSABS 1.2 10/01/2015 0901   MONOABS 0.4 10/01/2015 0901   EOSABS 0.1 10/01/2015 0901   BASOSABS 0.1 10/01/2015 0901    BMET    Component Value Date/Time   NA 135 01/12/2016 0621   K 3.1 (L) 01/12/2016 0621   CL 91 (L) 01/12/2016 0621   CO2 29 01/12/2016 0621   GLUCOSE 151 (H) 01/12/2016 0621   BUN 23 (H) 01/12/2016 0621   CREATININE 1.20 01/12/2016 0621   CALCIUM 8.4 (L) 01/12/2016 0621   GFRNONAA 57 (L) 01/12/2016 0621   GFRAA >60 01/12/2016 0621    INR    Component Value Date/Time   INR 1.00 01/01/2016 0705     Intake/Output Summary (Last 24 hours) at 01/12/16 1309 Last data filed at 01/12/16 1000  Gross per 24 hour  Intake          1303.06 ml  Output             1012 ml  Net           291.06 ml     Assessment:  75 y.o. male s/p Left aka following failed bypass, now with resolving ileus and chest tube managed by general surgery for tension pneumothorax and PE previously on pradaxa for afib  Plan: -continue hep gtt, will transition back to pradaxa tomorrow with chest tube out today -having bowel function and diet advancing per general surgery -electrolyte replacement ongoing -continue step down status given 6L nasal cannula-wean O2 today -aka wound site progressing well, will place ACE wrap to  protect -DVT prophylaxis:  On therapeutic heparin   Maryama Kuriakose C. Donzetta Matters, MD [facility] Office: [phone] Pager: [phone]  01/12/2016 1:09 PM

## 2016-01-12 NOTE — Plan of Care (Signed)
Spoke to Vascular concerning questionable need to hold Heparin prior to chest tube removal.  Dr. Lenon Ahmadi no need to hold Heparin prior to removal of chest tube.  Discussed concern for upper inner thigh being hard, non painful to patient along with constant oozing from mid thigh incision.  No further orders at this time.  Plan to pull foley as patient is complaining of burning around foley cathether.  Will continue to monitor closely.

## 2016-01-12 NOTE — Progress Notes (Signed)
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 + new PE  Patient Measurements: Height: 5\' 8"  (172.7 cm) Weight: 194 lb (88 kg) IBW/kg (Calculated) : 68.4 Heparin Dosing Weight: 86.5 kg  Vital Signs: Temp: 98.5 F (36.9 C) (08/13 1700) Temp Source: Oral (08/13 1700) BP: 119/67 (08/13 1800)  Labs:  Recent Labs  01/10/16 0309  01/10/16 2041  01/11/16 0526  01/11/16 2045 01/12/16 0621 01/12/16 1759  HGB 10.8*  --   --   --  10.0*  --   --  10.4*  --   HCT 32.7*  --   --   --  30.4*  --   --  31.5*  --   PLT 133*  --   --   --  130*  --   --  144*  --   HEPARINUNFRC 0.37  < >  --   < >  --   < > 0.12* 0.13* 0.38  CREATININE 0.93  --  1.09  --  0.89  --   --  1.20  --   < > = values in this interval not displayed.  Estimated Creatinine Clearance: 57.3 mL/min (by C-G formula based on SCr of 1.2 mg/dL).   Assessment: 75 yo male with h/o AFib, Pradaxa PTA on hold.  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  Per critical care note on 8/10 - would prefer to keep him at the higher end of therapeutic range but struggle getting him therapeutic over weekend - requiring much more heparin than last week and HL still low - Also having new bleeding in last 24hr Both hematuria and oozing from groin surgical site - CBC stable Heparin level remains low 0.13 on 2600 units/hr.  I checked line with RN infusing ok.  Could be lasrge clot burden needing more heparin. Wil not bolus with new bleeding but will increase drip rate.  Repeat HL is now therapeutic at 0.38 on heparin 2800 units/hr. No issues with infusion or bleeding noted.  Goal of Therapy:  Heparin level 0.3-0.7 units/ml Monitor platelets by anticoagulation protocol: Yes   Plan:  Continue heparin 2800 units/hr Daily HL/CBC Monitor s/sx of bleeding  Andrey Cota. Diona Foley, PharmD, [place] Clinical  Pharmacist Pager [phone] 01/12/2016 7:56 PM

## 2016-01-13 ENCOUNTER — Inpatient Hospital Stay (HOSPITAL_COMMUNITY): Payer: No Typology Code available for payment source

## 2016-01-13 DIAGNOSIS — I82509 Chronic embolism and thrombosis of unspecified deep veins of unspecified lower extremity: Secondary | ICD-10-CM

## 2016-01-13 DIAGNOSIS — D62 Acute posthemorrhagic anemia: Secondary | ICD-10-CM

## 2016-01-13 DIAGNOSIS — I5189 Other ill-defined heart diseases: Secondary | ICD-10-CM

## 2016-01-13 DIAGNOSIS — R140 Abdominal distension (gaseous): Secondary | ICD-10-CM

## 2016-01-13 DIAGNOSIS — E876 Hypokalemia: Secondary | ICD-10-CM

## 2016-01-13 DIAGNOSIS — S298XXS Other specified injuries of thorax, sequela: Secondary | ICD-10-CM

## 2016-01-13 DIAGNOSIS — D72829 Elevated white blood cell count, unspecified: Secondary | ICD-10-CM

## 2016-01-13 DIAGNOSIS — J189 Pneumonia, unspecified organism: Secondary | ICD-10-CM

## 2016-01-13 DIAGNOSIS — R000 Tachycardia, unspecified: Secondary | ICD-10-CM

## 2016-01-13 DIAGNOSIS — R0682 Tachypnea, not elsewhere classified: Secondary | ICD-10-CM

## 2016-01-13 DIAGNOSIS — Z89612 Acquired absence of left leg above knee: Secondary | ICD-10-CM

## 2016-01-13 DIAGNOSIS — E785 Hyperlipidemia, unspecified: Secondary | ICD-10-CM

## 2016-01-13 DIAGNOSIS — J181 Lobar pneumonia, unspecified organism: Secondary | ICD-10-CM

## 2016-01-13 DIAGNOSIS — I519 Heart disease, unspecified: Secondary | ICD-10-CM

## 2016-01-13 DIAGNOSIS — I2510 Atherosclerotic heart disease of native coronary artery without angina pectoris: Secondary | ICD-10-CM

## 2016-01-13 DIAGNOSIS — D696 Thrombocytopenia, unspecified: Secondary | ICD-10-CM

## 2016-01-13 DIAGNOSIS — G8918 Other acute postprocedural pain: Secondary | ICD-10-CM

## 2016-01-13 DIAGNOSIS — S78119A Complete traumatic amputation at level between unspecified hip and knee, initial encounter: Secondary | ICD-10-CM

## 2016-01-13 LAB — BASIC METABOLIC PANEL
Anion gap: 13 (ref 5–15)
BUN: 23 mg/dL — AB (ref 6–20)
CALCIUM: 8.3 mg/dL — AB (ref 8.9–10.3)
CHLORIDE: 94 mmol/L — AB (ref 101–111)
CO2: 29 mmol/L (ref 22–32)
CREATININE: 1.24 mg/dL (ref 0.61–1.24)
GFR calc non Af Amer: 55 mL/min — ABNORMAL LOW (ref >60)
Glucose, Bld: 205 mg/dL — ABNORMAL HIGH (ref 65–99)
Potassium: 3 mmol/L — ABNORMAL LOW (ref 3.5–5.1)
SODIUM: 136 mmol/L (ref 135–145)

## 2016-01-13 LAB — HEPARIN LEVEL (UNFRACTIONATED)

## 2016-01-13 LAB — CBC
HCT: 33.4 % — ABNORMAL LOW (ref 39.0–52.0)
Hemoglobin: 10.8 g/dL — ABNORMAL LOW (ref 13.0–17.0)
MCH: 28.3 pg (ref 26.0–34.0)
MCHC: 32.3 g/dL (ref 30.0–36.0)
MCV: 87.4 fL (ref 78.0–100.0)
PLATELETS: 147 10*3/uL — AB (ref 150–400)
RBC: 3.82 MIL/uL — AB (ref 4.22–5.81)
RDW: 14.4 % (ref 11.5–15.5)
WBC: 13.8 10*3/uL — ABNORMAL HIGH (ref 4.0–10.5)

## 2016-01-13 LAB — GLUCOSE, CAPILLARY
GLUCOSE-CAPILLARY: 197 mg/dL — AB (ref 65–99)
GLUCOSE-CAPILLARY: 193 mg/dL — AB (ref 65–99)
Glucose-Capillary: 172 mg/dL — ABNORMAL HIGH (ref 65–99)
Glucose-Capillary: 176 mg/dL — ABNORMAL HIGH (ref 65–99)
Glucose-Capillary: 210 mg/dL — ABNORMAL HIGH (ref 65–99)

## 2016-01-13 LAB — APTT: aPTT: >200 seconds (ref 24–36)

## 2016-01-13 MED ORDER — NIFEDIPINE ER OSMOTIC RELEASE 30 MG PO TB24
90.0000 mg | ORAL_TABLET | Freq: Every evening | ORAL | Status: DC
  Administered 2016-01-13: 90 mg via ORAL
  Filled 2016-01-13 (×2): qty 1

## 2016-01-13 MED ORDER — NIACIN ER 500 MG PO CPCR
750.0000 mg | ORAL_CAPSULE | Freq: Every day | ORAL | Status: DC
  Filled 2016-01-13: qty 1

## 2016-01-13 MED ORDER — ATORVASTATIN CALCIUM 40 MG PO TABS
40.0000 mg | ORAL_TABLET | Freq: Every evening | ORAL | Status: DC
  Administered 2016-01-13 – 2016-01-16 (×4): 40 mg via ORAL
  Filled 2016-01-13 (×4): qty 1

## 2016-01-13 MED ORDER — ENALAPRIL MALEATE 10 MG PO TABS
20.0000 mg | ORAL_TABLET | Freq: Two times a day (BID) | ORAL | Status: DC
  Administered 2016-01-13 (×2): 20 mg via ORAL
  Filled 2016-01-13 (×3): qty 1

## 2016-01-13 MED ORDER — HEPARIN (PORCINE) IN NACL 100-0.45 UNIT/ML-% IJ SOLN
2400.0000 [IU]/h | INTRAMUSCULAR | Status: DC
  Administered 2016-01-13 (×2): 2400 [IU]/h via INTRAVENOUS
  Filled 2016-01-13 (×2): qty 250

## 2016-01-13 MED ORDER — METOPROLOL SUCCINATE ER 100 MG PO TB24
100.0000 mg | ORAL_TABLET | Freq: Every day | ORAL | Status: DC
  Administered 2016-01-13: 100 mg via ORAL
  Filled 2016-01-13: qty 1

## 2016-01-13 MED ORDER — POTASSIUM CHLORIDE CRYS ER 20 MEQ PO TBCR
30.0000 meq | EXTENDED_RELEASE_TABLET | Freq: Two times a day (BID) | ORAL | Status: DC
  Administered 2016-01-13 – 2016-01-15 (×5): 30 meq via ORAL
  Filled 2016-01-13 (×5): qty 1

## 2016-01-13 MED ORDER — NITROGLYCERIN 0.4 MG SL SUBL: 0.4000 mg | SUBLINGUAL_TABLET | SUBLINGUAL | Status: DC | PRN

## 2016-01-13 MED ORDER — POTASSIUM CHLORIDE CRYS ER 20 MEQ PO TBCR
40.0000 meq | EXTENDED_RELEASE_TABLET | Freq: Once | ORAL | Status: AC
  Administered 2016-01-13: 40 meq via ORAL
  Filled 2016-01-13: qty 2

## 2016-01-13 MED ORDER — ENSURE ENLIVE PO LIQD
237.0000 mL | Freq: Three times a day (TID) | ORAL | Status: DC
  Administered 2016-01-13 – 2016-01-17 (×9): 237 mL via ORAL

## 2016-01-13 MED ORDER — CHLORTHALIDONE 25 MG PO TABS
12.5000 mg | ORAL_TABLET | Freq: Every day | ORAL | Status: DC
  Administered 2016-01-13 – 2016-01-15 (×2): 12.5 mg via ORAL
  Filled 2016-01-13 (×3): qty 0.5

## 2016-01-13 MED ORDER — DIPHENOXYLATE-ATROPINE 2.5-0.025 MG PO TABS
1.0000 | ORAL_TABLET | Freq: Every day | ORAL | Status: DC | PRN
  Administered 2016-01-13: 1 via ORAL
  Filled 2016-01-13: qty 1

## 2016-01-13 MED ORDER — NIACIN 500 MG PO TABS
750.0000 mg | ORAL_TABLET | Freq: Every day | ORAL | Status: DC
  Administered 2016-01-13: 750 mg via ORAL
  Filled 2016-01-13 (×3): qty 1

## 2016-01-13 NOTE — Progress Notes (Addendum)
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 + new PE  Patient Measurements: Height: 5\' 8"  (172.7 cm) Weight: 194 lb (88 kg) IBW/kg (Calculated) : 68.4 Heparin Dosing Weight: 86.5 kg  Vital Signs: Temp: 97.4 F (36.3 C) (08/14 2025) Temp Source: Oral (08/14 2025) BP: 110/66 (08/14 1942) Pulse Rate: 113 (08/14 1600)  Labs:  Recent Labs  01/11/16 0526  01/12/16 0621  01/13/16 0348 01/13/16 1552 01/13/16 1858 01/13/16 2108 01/13/16 2142  HGB 10.0*  --  10.4*  --  10.8*  --   --   --   --   HCT 30.4*  --  31.5*  --  33.4*  --   --   --   --   PLT 130*  --  144*  --  147*  --   --   --   --   APTT  --   --   --   --   --   --   --  >200*  --   HEPARINUNFRC  --   < > 0.13*  < > >2.20* >2.20* >2.20*  --  >2.20*  CREATININE 0.89  --  1.20  --  1.24  --   --   --   --   < > = values in this interval not displayed.  Estimated Creatinine Clearance: 55.5 mL/min (by C-G formula based on SCr of 1.24 mg/dL).   Assessment: 75 yo male with h/o AFib, Pradaxa PTA on hold.  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  Per critical care note on 8/10 - would prefer to keep him at the higher end of therapeutic range but struggle getting him therapeutic over weekend - requiring much more heparin than last week and HL still low. Some hematuria/oozing from groin surgical site 8/13 -appears resolved.   RN requested that line where heparin infusing be changed by IV team as she wasn't sure it was infusing correctly. It was infusing in the L arm but changed 8/13 p.m. to the R hand. Heparin level now >2.2 (supratherapeutic) on 2800 units/hr. This level was drawn from L arm and heparin had been infusing in R hand for >8 hours so will assume it is accurate.  PM f/u > heparin level remains > 2.2 on heparin at reduced rate of 2400 units/hr.  No bleeding or  complications noted.  Visualized heparin gtt with RN, confirmed infusing at intended rate, and per patient, level was drawn from opposite arm.  2100 follow up: Heparin level remains > 2.2 despite heparin being off x 1 hr.  Suspect previous IV site not functioning properly, since heparin levels now high with new IV site.  Spoke with lab, they ran a PTT on 2 most recent vials of blood, both were  > 200.   2330 follow up:  Heparin level remains > 2.2 despite heparin being off x 3.5 hr when level drawn at 2145.  Suspect previous IV site not functioning properly, since heparin levels now high with new IV site.  Goal of Therapy:  Heparin level 0.3-0.7 units/ml Monitor platelets by anticoagulation protocol: Yes   Plan:  Continue holding IV heparin for now Recheck heparin level at 0200 Will resume heparin when level back in goal range Noted may restart pradaxa tomorrow per VVS note  Sherlon Handing, PharmD, BCPS Clinical  pharmacist, pager [phone]  01/13/2016 11:31 PM      ADDENDUM: Heparin level at 0215 results as 1.22; unclear how quickly pt is clearing.  Will check another level prior to resuming heparin.  Prior to heparin levels being undetectable pt was therapeutic at 1450 units/hr. Wynona Neat, PharmD, BCPS 01/14/2016 3:58 AM

## 2016-01-13 NOTE — Significant Event (Signed)
Notified by central telemetry of a 12 beat run of V-Tach.  After review appears to be a 10 beat run of V-tach, patient was asleep at time of event.  BP remains stable no complaints.  Dr. Donnetta Hutching notified, will continue to monitor patient closely.

## 2016-01-13 NOTE — Progress Notes (Signed)
Daughter requests patient's Vitamin D & B & iron levels checked.

## 2016-01-13 NOTE — Progress Notes (Signed)
[place] for Heparin Indication: Afib + occluded L leg bypass s/p AKA 8/8 + new PE  Patient Measurements: Height: 5\' 8"  (172.7 cm) Weight: 194 lb (88 kg) IBW/kg (Calculated) : 68.4 Heparin Dosing Weight: 86.5 kg  Vital Signs: Temp: 97.8 F (36.6 C) (08/13 2325) Temp Source: Oral (08/13 2325)  Labs:  Recent Labs  01/11/16 0526  01/12/16 0621 01/12/16 1759 01/13/16 0348  HGB 10.0*  --  10.4*  --  10.8*  HCT 30.4*  --  31.5*  --  33.4*  PLT 130*  --  144*  --  147*  HEPARINUNFRC  --   < > 0.13* 0.38 >2.20*  CREATININE 0.89  --  1.20  --  1.24  < > = values in this interval not displayed.  Estimated Creatinine Clearance: 55.5 mL/min (by C-G formula based on SCr of 1.24 mg/dL).   Assessment: 75 yo male with h/o AFib, Pradaxa PTA on hold.  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  Per critical care note on 8/10 - would prefer to keep him at the higher end of therapeutic range but struggle getting him therapeutic over weekend - requiring much more heparin than last week and HL still low. Some hematuria/oozing from groin surgical site 8/13 -appears resolved.   RN requested that line where heparin infusing be changed by IV team as she wasn't sure it was infusing correctly. It was infusing in the L arm but changed 8/13 p.m. to the R hand. Heparin level now >2.2 (supratherapeutic) on 2800 units/hr. This level was drawn from L arm and heparin had been infusing in R hand for >8 hours so will assume it is accurate.  Goal of Therapy:  Heparin level 0.3-0.7 units/ml Monitor platelets by anticoagulation protocol: Yes   Plan:  Hold heparin x 1 hour Decrease heparin to 2400 units/hr Will f/u 8 hr heparin level  Sherlon Handing, PharmD, BCPS Clinical pharmacist, pager [phone] 01/13/2016 6:00 AM

## 2016-01-13 NOTE — Progress Notes (Signed)
I met with pt and his daughter at bedside to discuss an inpt rehab admission when pt medically ready pending increased tolerance to participate due to ongoing medical issues. Pt and daughter prefer an inpt rehab admission rather than SNF. Pt plans to d/c home with his girlfriend at d/c to a one level home with level entry. I will follow his progress. [phone]

## 2016-01-13 NOTE — Progress Notes (Signed)
Patient ID: Raymond Jacobson, male   DOB: 05/20/1941, 75 y.o.   MRN: [id number] 6 Days Post-Op  Subjective: Feels better with CT out, no SOB  Objective: Vital signs in last 24 hours: Temp:  [97.8 F (36.6 C)-98.5 F (36.9 C)] 97.8 F (36.6 C) (08/13 2325) Resp:  [16-23] 19 (08/13 1800) BP: (104-120)/(64-88) 119/67 (08/13 1800) SpO2:  [94 %-97 %] 96 % (08/13 1800) Last BM Date: 01/12/16  Intake/Output from previous day: 08/13 0701 - 08/14 0700 In: 674.1 [P.O.:320; I.V.:354.1] Out: 388 [Urine:376; [place]; Chest Tube:10] Intake/Output this shift: No intake/output data recorded.  General appearance: cooperative Resp: clear to auscultation bilaterally Cardio: irregularly irregular rhythm GI: soft, NT, ND  Lab Results: CBC   Recent Labs  01/12/16 0621 01/13/16 0348  WBC 13.2* 13.8*  HGB 10.4* 10.8*  HCT 31.5* 33.4*  PLT 144* 147*   BMET  Recent Labs  01/12/16 0621 01/13/16 0348  NA 135 136  K 3.1* 3.0*  CL 91* 94*  CO2 29 29  GLUCOSE 151* 205*  BUN 23* 23*  CREATININE 1.20 1.24  CALCIUM 8.4* 8.3*   PT/INR No results for input(s): LABPROT, INR in the last 72 hours. ABG No results for input(s): PHART, HCO3 in the last 72 hours.  Invalid input(s): PCO2, PO2  Studies/Results: Dg Chest Port 1 View  Result Date: 01/12/2016 CLINICAL DATA:  Chest tube EXAM: PORTABLE CHEST 1 VIEW COMPARISON:  01/11/2016 FINDINGS: Right chest tube unchanged in position. No pneumothorax on today's study. Mild right lower lobe atelectasis has improved. Negative for heart failure or effusion. NG tube removed. IMPRESSION: Negative for pneumothorax Improvement in right lower lobe atelectasis. Electronically Signed   By: Franchot Gallo M.D.   On: 01/12/2016 07:33    Anti-infectives: Anti-infectives    Start     Dose/Rate Route Frequency Ordered Stop   01/09/16 0930  vancomycin (VANCOCIN) IVPB 1000 mg/200 mL premix  Status:  Discontinued     1,000 mg 200 mL/hr over 60 Minutes Intravenous  Every 12 hours 01/08/16 2121 01/10/16 1119   01/08/16 2200  piperacillin-tazobactam (ZOSYN) IVPB 3.375 g  Status:  Discontinued     3.375 g 12.5 mL/hr over 240 Minutes Intravenous Every 8 hours 01/08/16 2121 01/10/16 1119   01/08/16 2130  vancomycin (VANCOCIN) 1,500 mg in sodium chloride 0.9 % 500 mL IVPB     1,500 mg 250 mL/hr over 120 Minutes Intravenous  Once 01/08/16 2121 01/09/16 0030   01/07/16 1800  cefUROXime (ZINACEF) 1.5 g in dextrose 5 % 50 mL IVPB  Status:  Discontinued     1.5 g 100 mL/hr over 30 Minutes Intravenous Every 12 hours 01/07/16 1743 01/08/16 1124   01/07/16 1430  cefUROXime (ZINACEF) 1.5 g in dextrose 5 % 50 mL IVPB     1.5 g 100 mL/hr over 30 Minutes Intravenous  Once 01/07/16 1425 01/07/16 1409   01/07/16 1350  dextrose 5 % with cefUROXime (ZINACEF) ADS Med    Comments:  Kerrie Pleasure   : cabinet override      01/07/16 1350 01/08/16 0159   01/06/16 2151  dextrose 5 % with cefUROXime (ZINACEF) ADS Med    Comments:  Haynes Bast   : cabinet override      01/06/16 2151 01/06/16 2200   01/06/16 1824  dextrose 5 % with cefUROXime (ZINACEF) ADS Med    Comments:  Trixie Deis   : cabinet override      01/06/16 1824 01/07/16 0629   01/06/16 0600  cefUROXime (  ZINACEF) 1.5 g in dextrose 5 % 50 mL IVPB     1.5 g 100 mL/hr over 30 Minutes Intravenous On call to O.R. 01/05/16 1035 01/06/16 1430   01/01/16 2200  cefUROXime (ZINACEF) 1.5 g in dextrose 5 % 50 mL IVPB     1.5 g 100 mL/hr over 30 Minutes Intravenous Every 12 hours 01/01/16 2154 01/02/16 1030   01/01/16 1736  dextrose 5 % with cefUROXime (ZINACEF) ADS Med    Comments:  Gershon Crane   : cabinet override      01/01/16 1736 01/02/16 0544   01/01/16 0552  cefUROXime (ZINACEF) 1.5 g in dextrose 5 % 50 mL IVPB     1.5 g 100 mL/hr over 30 Minutes Intravenous 30 min pre-op 01/01/16 0552 01/01/16 0902      Assessment/Plan: Ileus - resolved R PTX - CT out yesterday. Check CXR now.  LOS: 12 days    Georganna Skeans, MD, MPH, FACS Trauma: [phone] General Surgery: [phone]  01/13/2016

## 2016-01-13 NOTE — Consult Note (Signed)
Physical Medicine and Rehabilitation Consult Reason for Consult: Left AKA Referring Physician: Dr. Oneida Alar   HPI: Raymond Jacobson is a 75 y.o. right handed male with history of CAD with stenting, DVT maintained on Pradaxa, hypertension, hyperlipidemia and peripheral vascular disease with revascularization procedures in the past and recurrent occlusion of left femoral-popliteal bypass with recent thrombectomy. Per chart review patient lives alone and was independent and active prior to admission. He plans to stay with male companion and assistance as needed. One level home with 2 steps to entry. Presented 01/02/2016 with ischemic changes left lower extremity findings of occlusion of left femoral-popliteal bypass with thrombectomy. No change with attempted revascularization procedure. 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 Oneida Alar. Hospital course pain management. Acute blood loss anemia 8.9-9.9 and monitored. Subcutaneous heparin for DVT prophylaxis. On 01/08/2016 complaints of abdominal pain/shortness of breath with chest x-ray completed showing moderate size right-sided pneumothorax as well as increasing air opacities at the right lower lobe and right middle lobe concerning for aspiration. Maintained on broad-spectrum antibiotics. A chest tube was placed and patient was transferred to ICU. CT of the chest positive for acute pulmonary emboli with CT evidence of right heart strain. Echocardiogram with ejection fraction of 60% and grade 1 diastolic dysfunction. Intravenous heparin was initiated.. Chest tube removed 01/12/2016. Physical therapy evaluation completed. M.D. has requested physical medicine rehabilitation consult   ROS Constitutional: Negative for chills and fever.  HENT: Negative for hearing loss.   Eyes: Negative for blurred vision and double vision.  Respiratory: Positive for cough.        Occasional shortness of breath with exertion  Cardiovascular: Positive for leg swelling. Negative  for chest pain.  Gastrointestinal:       GERD  Genitourinary: Positive for urgency. Negative for dysuria and hematuria.  Musculoskeletal: Positive for joint pain and myalgias.  Skin: Negative for rash.  Neurological: Positive for weakness. Negative for seizures and headaches.  All other systems reviewed and are negative    Past Medical History:  Diagnosis Date  . Angina    last time >yr  . Arthritis    "knees, ankles" (10/15/2015)  . Basal cell carcinoma of scalp    "froze off"  . CAD (coronary artery disease)    Previous stents last 6 years ago [place] CT  . Complication of anesthesia   . Cough    recent bronchitis- dry cough  . Dry cough   . DVT (deep venous thrombosis) (HCC) ~ 1969   LLE  . Elevated lipids   . GERD (gastroesophageal reflux disease)    occ -tums  . High cholesterol   . History of hiatal hernia    hx  . Hypertension   . Myocardial infarction [facility]) ~ 2006   "blood work"  . Peripheral vascular disease ([place])   . PONV (postoperative nausea and vomiting)   . Shortness of breath 04/13/11   "mostly w/exertion"/ now with bronchitis   Past Surgical History:  Procedure Laterality Date  . AMPUTATION Left 01/07/2016   Procedure: AMPUTATION LEFT LEG  ABOVE KNEE;  Surgeon: Elam Dutch, MD;  Location: [place];  Service: Vascular;  Laterality: Left;  . CARPAL TUNNEL RELEASE Right 1990s  . CORONARY ANGIOPLASTY WITH STENT PLACEMENT  ~ 2004; ~ 2006; 04/13/11   "# of stents 1 (i2004);+2 (2006)+ 1 (04/13/11)= 4 total"  . ENDARTERECTOMY FEMORAL Left 01/01/2016   Procedure: LEFT FEMORAL ARTERY  ENDARTERECTOMY ;  Surgeon: Elam Dutch, MD;  Location: [place];  Service: Vascular;  Laterality: Left;  . FEMORAL ARTERY STENT Left 10/15/2015  . FEMORAL-POPLITEAL BYPASS GRAFT Left 01/01/2016   Procedure: Thrombectomy of Left Leg Femoral-Popliteal Bypass Graft;  Surgeon: Elam Dutch, MD;  Location: [place];  Service: Vascular;  Laterality: Left;  . FEMORAL-POPLITEAL BYPASS  GRAFT Left 01/01/2016   Procedure: LEFT  FEMORAL-POPLITEAL ARTERY  BYPASS GRAFT ;  Surgeon: Elam Dutch, MD;  Location: [place];  Service: Vascular;  Laterality: Left;  . FEMORAL-POPLITEAL BYPASS GRAFT Left 01/06/2016   Procedure: THROMBECTOMY WITH REVISION oF dISTAL Anastomosis BYPASS GRAFT FEMORAL-POPLITEAL ARTERY.;  Surgeon: Rosetta Posner, MD;  Location: [place];  Service: Vascular;  Laterality: Left;  . FEMORAL-POPLITEAL BYPASS GRAFT Left 01/06/2016   Procedure: REDO FEMORAL to BELOW THE KNEE POPLITEAL ARTERY BYPASS USING 77mm PROPATEN GRAFT;  Surgeon: Elam Dutch, MD;  Location: [place];  Service: Vascular;  Laterality: Left;  . FINGER AMPUTATION  1967   "partial; right pointer"  . INTRAOPERATIVE ARTERIOGRAM Left 01/01/2016   Procedure: INTRA OPERATIVE ARTERIOGRAM;  Surgeon: Elam Dutch, MD;  Location: [place];  Service: Vascular;  Laterality: Left;  . LEFT HEART CATHETERIZATION WITH CORONARY ANGIOGRAM N/A 04/13/2011   Procedure: LEFT HEART CATHETERIZATION WITH CORONARY ANGIOGRAM;  Surgeon: Josue Hector, MD;  Location: [facility] CATH LAB;  Service: Cardiovascular;  Laterality: N/A;  . LEFT HEART CATHETERIZATION WITH CORONARY ANGIOGRAM N/A 06/20/2013   Procedure: LEFT HEART CATHETERIZATION WITH CORONARY ANGIOGRAM;  Surgeon: Leonie Man, MD;  Location: [facility] CATH LAB;  Service: Cardiovascular;  Laterality: N/A;  . PERCUTANEOUS CORONARY STENT INTERVENTION (PCI-S) Right 04/13/2011   Procedure: PERCUTANEOUS CORONARY STENT INTERVENTION (PCI-S);  Surgeon: Burnell Blanks, MD;  Location: [facility] CATH LAB;  Service: Cardiovascular;  Laterality: Right;  . PERIPHERAL VASCULAR CATHETERIZATION N/A 10/15/2015   Procedure: Abdominal Aortogram w/Lower Extremity;  Surgeon: Serafina Mitchell, MD;  Location: [place] CV LAB;  Service: Cardiovascular;  Laterality: N/A;  . TONSILLECTOMY     "when I was a young one"  . Tricep Surgery  ~ 2007   "right arm; tore all my muscles @ my elbow"  . VEIN HARVEST Left 01/01/2016     Procedure: USING NON-REVERSE LEFT GREATER SAPHENOUS VEIN HARVEST;  Surgeon: Elam Dutch, MD;  Location: [facility] OR;  Service: Vascular;  Laterality: Left;   Family History  Problem Relation Age of Onset  . Cancer Father   . Heart attack Paternal Grandfather    Social History:  reports that he quit smoking about 48 years ago. His smoking use included Cigarettes. He has a 12.00 pack-year smoking history. He has never used smokeless tobacco. He reports that he drinks alcohol. He reports that he does not use drugs. Allergies:  Allergies  Allergen Reactions  . No Known Allergies    Medications Prior to Admission  Medication Sig Dispense Refill  . atorvastatin (LIPITOR) 80 MG tablet Take 1 tablet (80 mg total) by mouth every evening. (Patient taking differently: Take 40 mg by mouth every evening. ) 30 tablet 11  . chlorthalidone (HYGROTON) 25 MG tablet Take 12.5 mg by mouth daily.    . dabigatran (PRADAXA) 150 MG CAPS capsule Take 150 mg by mouth 2 (two) times daily.    . diphenoxylate-atropine (LOMOTIL) 2.5-0.025 MG tablet Take 1 tablet by mouth daily as needed for diarrhea or loose stools.    . enalapril (VASOTEC) 20 MG tablet Take 1 tablet (20 mg total) by mouth 2 (two) times daily. 60 tablet 11  . isosorbide  mononitrate (IMDUR) 30 MG 24 hr tablet Take 30 mg by mouth 2 (two) times daily.    . metoprolol (TOPROL-XL) 200 MG 24 hr tablet Take 100 mg by mouth daily.    . niacin (NIASPAN) 750 MG CR tablet Take 750 mg by mouth at bedtime.      Marland Kitchen NIFEdipine (ADALAT CC) 90 MG 24 hr tablet Take 90 mg by mouth every evening.    Marland Kitchen OVER THE COUNTER MEDICATION Take 1 capsule by mouth daily. "Super Beta Prostate" supplement    . nitroGLYCERIN (NITROSTAT) 0.4 MG SL tablet Place 1 tablet (0.4 mg total) under the tongue every 5 (five) minutes as needed for chest pain. 25 tablet 12    Home: Home Living Family/patient expects to be discharged to:: Private residence Living Arrangements: Alone Available  Help at Discharge: Friend(s) Type of Home: House Home Access: Stairs to enter [employer] of Steps: 2 Entrance Stairs-Rails: Can reach both Home Layout: One level Bathroom Shower/Tub: Chiropodist: Standard Bathroom Accessibility: No Home Equipment: None Additional Comments: Daughter in room during PT evaluation.   Functional History: Prior Function Level of Independence: Independent Functional Status:  Mobility: Bed Mobility Overal bed mobility: Needs Assistance Bed Mobility: Supine to Sit Supine to sit: Mod assist, +2 for physical assistance, +2 for safety/equipment, HOB elevated Sit to supine: +2 for physical assistance, Mod assist General bed mobility comments: Pt refused EOB activity due to fatigue  Transfers Overall transfer level: Needs assistance Equipment used: Rolling walker (2 wheeled) Transfers: Sit to/from Stand, [employer] Transfers Sit to Stand: Mod assist, +2 physical assistance, +2 safety/equipment Stand pivot transfers: Min assist, +2 safety/equipment General transfer comment: Assist of 2 to stand from EOB with cues for hand placement and technique. Stood from [facility] as well. SPT bed to [facility] and BSC to chair Min A for balance. Sp02 dropped to mid 80s on RA. Donned 02 and able to maintain in90s on 6L/min 02. Ambulation/Gait Ambulation/Gait assistance: Modified independent (Device/Increase time) Ambulation Distance (Feet): 200 Feet Assistive device: None (RW for for approx 38ft) Gait Pattern/deviations: Step-through pattern, Antalgic General Gait Details: steady with ambulation, no physical assist required, did not need RW, ambulated without device Gait velocity: decreased Gait velocity interpretation: Below normal speed for age/gender Stairs: Yes Stairs assistance: Supervision Stair Management: One rail Right, Step to pattern Number of Stairs: 4    ADL: ADL Overall ADL's : Needs assistance/impaired Eating/Feeding:  NPO Grooming: Wash/dry hands, Oral care, Wash/dry face, Minimal assistance, Bed level Upper Body Bathing: Moderate assistance, Bed level Lower Body Bathing: Total assistance, Bed level Upper Body Dressing : Maximal assistance, Bed level Lower Body Dressing: Total assistance, Sit to/from stand Functional mobility during ADLs: +2 for physical assistance, Moderate assistance  Cognition: Cognition Overall Cognitive Status: Within Functional Limits for tasks assessed Orientation Level: Oriented X4 Cognition Arousal/Alertness: Awake/alert Behavior During Therapy: WFL for tasks assessed/performed Overall Cognitive Status: Within Functional Limits for tasks assessed  Blood pressure 119/67, pulse (!) 111, temperature 97.8 F (36.6 C), temperature source Oral, resp. rate 19, height 5\' 8"  (1.727 m), weight 88 kg (194 lb), SpO2 96 %. Physical Exam  Vitals reviewed. Constitutional: He is oriented to person, place, and time. He appears well-developed and well-nourished.  HENT:  Head: Normocephalic and atraumatic.  Eyes: Conjunctivae and EOM are normal.  Neck: Normal range of motion. Neck supple. No thyromegaly present.  Cardiovascular: Normal rate and regular rhythm.   Respiratory: Breath sounds normal.  Chest tube site is dressed Increased WOB  GI:  Abdomen is mildly distended. Positive bowel sounds nontender  Musculoskeletal: He exhibits edema and tenderness.  Left AKA  Neurological: He is alert and oriented to person, place, and time.  Motor: B/l UE 5/5, RLE 5/5 proximal to distal LLE: Hip flexion 5/5  Skin: Skin is warm and dry.  AKA site is dressed appropriately tender  Psychiatric: He has a normal mood and affect. His behavior is normal. Thought content normal.    Results for orders placed or performed during the hospital encounter of 01/01/16 (from the past 24 hour(s))  Glucose, capillary     Status: Abnormal   Collection Time: 01/12/16 11:45 AM  Result Value Ref Range    Glucose-Capillary 168 (H) 65 - 99 mg/dL   Comment 1 Capillary Specimen   Glucose, capillary     Status: Abnormal   Collection Time: 01/12/16  5:29 PM  Result Value Ref Range   Glucose-Capillary 189 (H) 65 - 99 mg/dL  Heparin level (unfractionated)     Status: None   Collection Time: 01/12/16  5:59 PM  Result Value Ref Range   Heparin Unfractionated 0.38 0.30 - 0.70 IU/mL  Glucose, capillary     Status: Abnormal   Collection Time: 01/12/16  8:48 PM  Result Value Ref Range   Glucose-Capillary 137 (H) 65 - 99 mg/dL   Comment 1 Capillary Specimen   Glucose, capillary     Status: Abnormal   Collection Time: 01/12/16 11:19 PM  Result Value Ref Range   Glucose-Capillary 124 (H) 65 - 99 mg/dL  Glucose, capillary     Status: Abnormal   Collection Time: 01/13/16  3:11 AM  Result Value Ref Range   Glucose-Capillary 197 (H) 65 - 99 mg/dL   Comment 1 Capillary Specimen   Basic metabolic panel     Status: Abnormal   Collection Time: 01/13/16  3:48 AM  Result Value Ref Range   Sodium 136 135 - 145 mmol/L   Potassium 3.0 (L) 3.5 - 5.1 mmol/L   Chloride 94 (L) 101 - 111 mmol/L   CO2 29 22 - 32 mmol/L   Glucose, Bld 205 (H) 65 - 99 mg/dL   BUN 23 (H) 6 - 20 mg/dL   Creatinine, Ser 1.24 0.61 - 1.24 mg/dL   Calcium 8.3 (L) 8.9 - 10.3 mg/dL   GFR calc non Af Amer 55 (L) >60 mL/min   GFR calc Af Amer >60 >60 mL/min   Anion gap 13 5 - 15  CBC     Status: Abnormal   Collection Time: 01/13/16  3:48 AM  Result Value Ref Range   WBC 13.8 (H) 4.0 - 10.5 K/uL   RBC 3.82 (L) 4.22 - 5.81 MIL/uL   Hemoglobin 10.8 (L) 13.0 - 17.0 g/dL   HCT 33.4 (L) 39.0 - 52.0 %   MCV 87.4 78.0 - 100.0 fL   MCH 28.3 26.0 - 34.0 pg   MCHC 32.3 30.0 - 36.0 g/dL   RDW 14.4 11.5 - 15.5 %   Platelets 147 (L) 150 - 400 K/uL  Heparin level (unfractionated)     Status: Abnormal   Collection Time: 01/13/16  3:48 AM  Result Value Ref Range   Heparin Unfractionated >2.20 (H) 0.30 - 0.70 IU/mL   Dg Chest Port 1  View  Result Date: 01/12/2016 CLINICAL DATA:  Chest tube EXAM: PORTABLE CHEST 1 VIEW COMPARISON:  01/11/2016 FINDINGS: Right chest tube unchanged in position. No pneumothorax on today's study. Mild right lower lobe atelectasis has improved.  Negative for heart failure or effusion. NG tube removed. IMPRESSION: Negative for pneumothorax Improvement in right lower lobe atelectasis. Electronically Signed   By: Franchot Gallo M.D.   On: 01/12/2016 07:33    Assessment/Plan: Diagnosis: Left AKA Labs and images independently reviewed.  Records reviewed and summated above. Clean amputation daily with soap and water Monitor incision site for signs of infection or impending skin breakdown. Staples to remain in place for 3-4 weeks Stump shrinker, for edema control  Scar mobilization massaging to prevent soft tissue adherence Stump protector during therapies Prevent flexion contractures by implementing the following:   Encourage prone lying for 20-30 mins per day BID to avoid hip flexion  Contractures if medically appropriate;  Avoid pillow under knees when patient is lying in bed in order to prevent hip flexion contractures;  Avoid prolonged sitting Post surgical pain control with oral medication Phantom limb pain control with physical modalities including desensitization techniques (gentle self massage to the residual stump,hot packs if sensation iintact, [place]) and mirror therapy, TENS. If ineffective, consider pharmacological treatment for neuropathic pain (e.g gabapentin, pregabalin, amytriptalyine, duloxetine).  Avoid injury to contralateral side   1. Does the need for close, 24 hr/day medical supervision in concert with the patient's rehab needs make it unreasonable for this patient to be served in a less intensive setting? Yes 2. Co-Morbidities requiring supervision/potential complications: CAD with stenting (cont meds, Monitor in accordance with increased physical activity and avoid UE resistance  excercises), DVT (cont meds), HTN (monitor and provide prns in accordance with increased physical exertion and pain), hyperlipidemia (cont meds), peripheral vascular disease with revascularization procedures (cont meds), post-op pain management (Biofeedback training with therapies to help reduce reliance on opiate pain medications, monitor pain control during therapies, and sedation at rest and titrate to maximum efficacy to ensure participation and gains in therapies), Acute blood loss anemia (transfuse if necessary to ensure appropriate perfusion for increased activity tolerance), right lower lobe PNA (cont abx), pneumothorax (CT removed, cont monitor), grade 1 diastolic dysfunction (monitor weights), tachypnea (monitor RR and O2 Sats with increased physical exertion), Tachycardia (monitor in accordance with pain and increasing activity), hypokalemia (continue to monitor and replete as necessary, consider Mag check), leukocytosis (cont to monitor for signs and symptoms of infection, further workup if indicated), Thrombocytopenia (< 60,000/mm3 no resistive exercise) 3. Due to bladder management, skin/wound care, disease management, pain management and patient education, does the patient require 24 hr/day rehab nursing? Yes 4. Does the patient require coordinated care of a physician, rehab nurse, PT (1-2 hrs/day, 5 days/week) and OT (1-2 hrs/day, 5 days/week) to address physical and functional deficits in the context of the above medical diagnosis(es)? Yes Addressing deficits in the following areas: balance, endurance, locomotion, strength, transferring, bowel/bladder control, bathing, toileting and psychosocial support 5. Can the patient actively participate in an intensive therapy program of at least 3 hrs of therapy per day at least 5 days per week? Does not appear to be able to tolerate at present 6. The potential for patient to make measurable gains while on inpatient rehab is excellent 7. Anticipated  functional outcomes upon discharge from inpatient rehab are supervision and min assist  with PT, supervision and min assist with OT, n/a with SLP. 8. Estimated rehab length of stay to reach the above functional goals is: 12-16 days. 9. Does the patient have adequate social supports and living environment to accommodate these discharge functional goals? Yes 10. Anticipated D/C setting: Home 11. Anticipated post D/C treatments: HH therapy  and Home excercise program 12. Overall Rehab/Functional Prognosis: good  RECOMMENDATIONS: This patient's condition is appropriate for continued rehabilitative care in the following setting: CIR once medically stable and able to tolerate 3 hours therapy/day. Patient has agreed to participate in recommended program. Yes Note that insurance prior authorization may be required for reimbursement for recommended care.  Comment: Rehab Admissions Coordinator to follow up.  Delice Lesch, MD 01/13/2016

## 2016-01-13 NOTE — Progress Notes (Signed)
Patient ID: Raymond Jacobson, male   DOB: Jan 14, 1941, 75 y.o.   MRN: [id number] CXR with no PTX. Please re-call us if needed.  Georganna Skeans, MD, MPH, FACS Trauma: [phone] General Surgery: [phone]

## 2016-01-13 NOTE — Progress Notes (Signed)
Physical Therapy Treatment Patient Details Name: Raymond Jacobson MRN: [id number] DOB: 1940/06/09 Today's Date: 01/13/2016    History of Present Illness 75 y.o. male admitted to [facility] on 01/01/16 for L leg claudication symptoms.  He underwent L femoral endarterectomy and left femoral to above knee popliteal bypass on 01/01/16.  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  NGT placed due to post op ileus and incidental finding of R PTX, so R CT placed on 01/08/16 -01/12/16.  Pt with significant PMHx of SOB, PVD, MI, HTN, DVT, and CAD.    PT Comments    Patient primarily limited by tachycardia, dizziness, and anxiety. Patient has difficulty slowing his breathing with SaO2 88-92% on 3L and >92% on 4L. HR 114 on arrival and as high as 156 bpm will sitting EOB. BP appropriately increased while in supine with exercises, however dropped with supine to sit. Per wife, pt was very active prior to recent surgeries.   Follow Up Recommendations  CIR     Equipment Recommendations  Rolling walker with 5" wheels;3in1 (PT);Wheelchair (measurements PT);Wheelchair cushion (measurements PT);Other (comment)    Recommendations for Other Services Rehab consult     Precautions / Restrictions Precautions Precautions: Fall    Mobility  Bed Mobility Overal bed mobility: Needs Assistance Bed Mobility: Rolling;Sidelying to Sit Rolling: Mod assist Sidelying to sit: Mod assist;+2 for safety/equipment;HOB elevated       General bed mobility comments: Incr time due to incr HR and dyspnea; heavy use of rail  Transfers Overall transfer level: Needs assistance Equipment used: Rolling walker (2 wheeled) Transfers: Sit to/from [employer] Sit to Stand: Min assist;+2 physical assistance;+2 safety/equipment Stand pivot transfers: Min guard;+2 physical assistance;+2 safety/equipment       General transfer comment: Pivot to chair on pt's Rt with pt  controlling/sequencing with RW himself.  Ambulation/Gait             General Gait Details: deferred due to elevated HR 122-156 while seated EOB   Stairs            Wheelchair Mobility    Modified Rankin (Stroke Patients Only)       Balance   Sitting-balance support: Single extremity supported;Feet supported Sitting balance-Leahy Scale: Poor Sitting balance - Comments: Able to sit EOB with 1 UE support.    Standing balance support: Bilateral upper extremity supported Standing balance-Leahy Scale: Poor                      Cognition Arousal/Alertness: Awake/alert Behavior During Therapy: Anxious Overall Cognitive Status: Within Functional Limits for tasks assessed                      Exercises General Exercises - Lower Extremity Ankle Circles/Pumps: AROM;Right;10 reps Heel Slides: AROM;Strengthening;Right;5 reps Amputee Exercises Quad Sets: AROM;Right;5 reps Hip Flexion/Marching: AROM;Left;Other reps (comment) (3)    General Comments General comments (skin integrity, edema, etc.): Initiated session with bed exercises with appropriate incr in BP (100s/70s to 120/83), however BP decr to 106/72 with sitting EOB. BP steady with transfer to chair      Pertinent Vitals/Pain Pain Assessment: 0-10 Pain Score: 5  Pain Location: LLE Pain Descriptors / Indicators: Constant;Sharp Pain Intervention(s): Limited activity within patient's tolerance;Monitored during session;Repositioned;Relaxation    Home Living  Prior Function            PT Goals (current goals can now be found in the care plan section) Acute Rehab PT Goals Patient Stated Goal: to get better  Time For Goal Achievement: 01/15/16 Progress towards PT goals: Progressing toward goals    Frequency  Min 3X/week    PT Plan Current plan remains appropriate    Co-evaluation             End of Session Equipment Utilized During Treatment: Gait  belt;Oxygen Activity Tolerance: Patient limited by fatigue;Treatment limited secondary to medical complications (Comment) (tachycardia with multiple PVC's ? short run vtach ) Patient left: in chair;with call bell/phone within reach;with chair alarm set;with family/visitor present     Time: [id number] PT Time Calculation (min) (ACUTE ONLY): 30 min  Charges:  $Therapeutic Exercise: 8-22 mins $Therapeutic Activity: 8-22 mins                    G Codes:      Hilary Pundt 2016/01/14, 12:09 PM Pager [phone]

## 2016-01-13 NOTE — Progress Notes (Signed)
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 + new PE  Patient Measurements: Height: 5\' 8"  (172.7 cm) Weight: 194 lb (88 kg) IBW/kg (Calculated) : 68.4 Heparin Dosing Weight: 86.5 kg  Vital Signs: Temp: 98.4 F (36.9 C) (08/14 1600) Temp Source: Oral (08/14 1600) BP: 117/71 (08/14 1600) Pulse Rate: 113 (08/14 1600)  Labs:  Recent Labs  01/11/16 0526  01/12/16 0621 01/12/16 1759 01/13/16 0348 01/13/16 1552  HGB 10.0*  --  10.4*  --  10.8*  --   HCT 30.4*  --  31.5*  --  33.4*  --   PLT 130*  --  144*  --  147*  --   HEPARINUNFRC  --   < > 0.13* 0.38 >2.20* >2.20*  CREATININE 0.89  --  1.20  --  1.24  --   < > = values in this interval not displayed.  Estimated Creatinine Clearance: 55.5 mL/min (by C-G formula based on SCr of 1.24 mg/dL).   Assessment: 75 yo male with h/o AFib, Pradaxa PTA on hold.  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  Per critical care note on 8/10 - would prefer to keep him at the higher end of therapeutic range but struggle getting him therapeutic over weekend - requiring much more heparin than last week and HL still low. Some hematuria/oozing from groin surgical site 8/13 -appears resolved.   RN requested that line where heparin infusing be changed by IV team as she wasn't sure it was infusing correctly. It was infusing in the L arm but changed 8/13 p.m. to the R hand. Heparin level now >2.2 (supratherapeutic) on 2800 units/hr. This level was drawn from L arm and heparin had been infusing in R hand for >8 hours so will assume it is accurate.  PM f/u > heparin level remains > 2.2 on heparin at reduced rate of 2400 units/hr.  No bleeding or complications noted.  Visualized heparin gtt with RN, confirmed infusing at intended rate, and per patient, level was drawn from opposite arm.  Goal of Therapy:  Heparin  level 0.3-0.7 units/ml Monitor platelets by anticoagulation protocol: Yes   Plan:  Hold heparin x 1 hour Recheck heparin level after infusion off x 1 hr to ensure trending down.  Uvaldo Rising, BCPS  Clinical Pharmacist Pager [phone]  01/13/2016 6:18 PM

## 2016-01-13 NOTE — Progress Notes (Signed)
I await updated therapy assessments to assist in determining rehab venue options. [id number]

## 2016-01-13 NOTE — Progress Notes (Addendum)
[facility] Progress Note  Subjective  - POD #6  No complaints this am. Denies SOB, CP or nausea. BMs are now more formed.   Objective Vitals:   01/12/16 2200 01/12/16 2325  BP:    Pulse:    Resp:    Temp: 97.9 F (36.6 C) 97.8 F (36.6 C)   02 94% 4L  Intake/Output Summary (Last 24 hours) at 01/13/16 0742 Last data filed at 01/12/16 2300  Gross per 24 hour  Intake           674.14 ml  Output              388 ml  Net           286.14 ml   Sinus tach (100s-110s) Unlabored respiratory effort Abdomen soft and non tender, positive bowel sounds Left groin and leg incisions clean and intact. Left AKA healing.   Assessment/Planning: 75 y.o. male is s/p: left AKA following failed bypass, now with resolving ileus and chest tube managed by general surgery for tension pneumothorax and PE previously on pradaxa for afib  6 Days Post-Op   -Chest tube out yesterday. CXR this am showing resolved pneumothorax.  -Ileus resolving. Tolerating soft diet. Advance as tolerated. BMs previously liquid, now formed.  -Hypokalemia: replete -Respiratory: Currently on 4L [place]. Wean as tolerated. Was previously on 6L yesterday.  -Trend creatinine.  -PE/Afib: Continue heparin. Will ask Dr. Oneida Alar about restarting Pradaxa today.  -Stable for transfer out of SDU.   Alvia [place] 01/13/2016 7:42 AM -- Some bloody drainage from left mid thigh incision.  Otherwise left AKA healing ok.  Groin without drainage Still hypokalemic continue to replete Wean O2 as tolerated for sat 95%, incentive spirometer Restart pradaxa tomorrow if wounds look ok Restart all other home  meds Leukocytosis trending up follow for now.  May need to drain thigh incision if continues to climb Rehab consult  Needs to start getting out of bed/mobilize Transfer to 2w tomorrow Push nutrition for wound healing  Ruta Hinds, MD [facility] [place]: [phone] Pager:  [phone]  Laboratory CBC    Component Value Date/Time   WBC 13.8 (H) 01/13/2016 0348   HGB 10.8 (L) 01/13/2016 0348   HCT 33.4 (L) 01/13/2016 0348   PLT 147 (L) 01/13/2016 0348    BMET    Component Value Date/Time   NA 136 01/13/2016 0348   K 3.0 (L) 01/13/2016 0348   CL 94 (L) 01/13/2016 0348   CO2 29 01/13/2016 0348   GLUCOSE 205 (H) 01/13/2016 0348   BUN 23 (H) 01/13/2016 0348   CREATININE 1.24 01/13/2016 0348   CALCIUM 8.3 (L) 01/13/2016 0348   GFRNONAA 55 (L) 01/13/2016 0348   GFRAA >60 01/13/2016 0348    COAG Lab Results  Component Value Date   INR 1.00 01/01/2016   INR 1.72 (H) 12/24/2015   INR 1.06 10/01/2015   No results found for: PTT  Antibiotics Anti-infectives    Start     Dose/Rate Route Frequency Ordered Stop   01/09/16 0930  vancomycin (VANCOCIN) IVPB 1000 mg/200 mL premix  Status:  Discontinued     1,000 mg 200 mL/hr over 60 Minutes Intravenous Every 12 hours 01/08/16 2121 01/10/16 1119   01/08/16 2200  piperacillin-tazobactam (ZOSYN) IVPB 3.375 g  Status:  Discontinued     3.375 g 12.5 mL/hr over 240 Minutes Intravenous Every 8 hours 01/08/16 2121 01/10/16 1119   01/08/16 2130  vancomycin (VANCOCIN)  1,500 mg in sodium chloride 0.9 % 500 mL IVPB     1,500 mg 250 mL/hr over 120 Minutes Intravenous  Once 01/08/16 2121 01/09/16 0030   01/07/16 1800  cefUROXime (ZINACEF) 1.5 g in dextrose 5 % 50 mL IVPB  Status:  Discontinued     1.5 g 100 mL/hr over 30 Minutes Intravenous Every 12 hours 01/07/16 1743 01/08/16 1124   01/07/16 1430  cefUROXime (ZINACEF) 1.5 g in dextrose 5 % 50 mL IVPB     1.5 g 100 mL/hr over 30 Minutes Intravenous  Once 01/07/16 1425 01/07/16 1409   01/07/16 1350  dextrose 5 % with cefUROXime (ZINACEF) ADS Med    Comments:  Kerrie Pleasure   : cabinet override      01/07/16 1350 01/08/16 0159   01/06/16 2151  dextrose 5 % with cefUROXime (ZINACEF) ADS Med    Comments:  Haynes Bast   : cabinet override      01/06/16 2151  01/06/16 2200   01/06/16 1824  dextrose 5 % with cefUROXime (ZINACEF) ADS Med    Comments:  Trixie Deis   : cabinet override      01/06/16 1824 01/07/16 0629   01/06/16 0600  cefUROXime (ZINACEF) 1.5 g in dextrose 5 % 50 mL IVPB     1.5 g 100 mL/hr over 30 Minutes Intravenous On call to O.R. 01/05/16 1035 01/06/16 1430   01/01/16 2200  cefUROXime (ZINACEF) 1.5 g in dextrose 5 % 50 mL IVPB     1.5 g 100 mL/hr over 30 Minutes Intravenous Every 12 hours 01/01/16 2154 01/02/16 1030   01/01/16 1736  dextrose 5 % with cefUROXime (ZINACEF) ADS Med    Comments:  Gershon Crane   : cabinet override      01/01/16 1736 01/02/16 0544   01/01/16 0552  cefUROXime (ZINACEF) 1.5 g in dextrose 5 % 50 mL IVPB     1.5 g 100 mL/hr over 30 Minutes Intravenous 30 min pre-op 01/01/16 0552 01/01/16 0902       Virgina Jock, PA-C [facility] Office: [phone] Pager: [phone] 01/13/2016 7:42 AM

## 2016-01-13 NOTE — Progress Notes (Addendum)
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 + new PE  Patient Measurements: Height: 5\' 8"  (172.7 cm) Weight: 194 lb (88 kg) IBW/kg (Calculated) : 68.4 Heparin Dosing Weight: 86.5 kg  Vital Signs: Temp: 97.4 F (36.3 C) (08/14 2025) Temp Source: Oral (08/14 2025) BP: 110/66 (08/14 1942) Pulse Rate: 113 (08/14 1600)  Labs:  Recent Labs  01/11/16 0526  01/12/16 0621  01/13/16 0348 01/13/16 1552 01/13/16 1858 01/13/16 2108  HGB 10.0*  --  10.4*  --  10.8*  --   --   --   HCT 30.4*  --  31.5*  --  33.4*  --   --   --   PLT 130*  --  144*  --  147*  --   --   --   APTT  --   --   --   --   --   --   --  >200*  HEPARINUNFRC  --   < > 0.13*  < > >2.20* >2.20* >2.20*  --   CREATININE 0.89  --  1.20  --  1.24  --   --   --   < > = values in this interval not displayed.  Estimated Creatinine Clearance: 55.5 mL/min (by C-G formula based on SCr of 1.24 mg/dL).   Assessment: 75 yo male with h/o AFib, Pradaxa PTA on hold.  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  Per critical care note on 8/10 - would prefer to keep him at the higher end of therapeutic range but struggle getting him therapeutic over weekend - requiring much more heparin than last week and HL still low. Some hematuria/oozing from groin surgical site 8/13 -appears resolved.   RN requested that line where heparin infusing be changed by IV team as she wasn't sure it was infusing correctly. It was infusing in the L arm but changed 8/13 p.m. to the R hand. Heparin level now >2.2 (supratherapeutic) on 2800 units/hr. This level was drawn from L arm and heparin had been infusing in R hand for >8 hours so will assume it is accurate.  PM f/u > heparin level remains > 2.2 on heparin at reduced rate of 2400 units/hr.  No bleeding or complications noted.  Visualized heparin gtt with RN,  confirmed infusing at intended rate, and per patient, level was drawn from opposite arm.  2100 follow up: Heparin level remains > 2.2 despite heparin being off x 1 hr.  Suspect previous IV site not functioning properly, since heparin levels now high with new IV site.  Spoke with lab, they ran a PTT on 2 most recent vials of blood, both were  > 200.   Goal of Therapy:  Heparin level 0.3-0.7 units/ml Monitor platelets by anticoagulation protocol: Yes   Plan:  Continue holding IV heparin for now. Recheck heparin level now (heparin off x 3 hrs now) Will resume heparin when level back in goal range.  Uvaldo Rising, BCPS  Clinical Pharmacist Pager [phone]  01/13/2016 9:52 PM

## 2016-01-14 LAB — BASIC METABOLIC PANEL
ANION GAP: 9 (ref 5–15)
BUN: 23 mg/dL — ABNORMAL HIGH (ref 6–20)
CHLORIDE: 99 mmol/L — AB (ref 101–111)
CO2: 27 mmol/L (ref 22–32)
Calcium: 7.9 mg/dL — ABNORMAL LOW (ref 8.9–10.3)
Creatinine, Ser: 1.2 mg/dL (ref 0.61–1.24)
GFR, EST NON AFRICAN AMERICAN: 57 mL/min — AB (ref >60)
Glucose, Bld: 181 mg/dL — ABNORMAL HIGH (ref 65–99)
POTASSIUM: 3.2 mmol/L — AB (ref 3.5–5.1)
SODIUM: 135 mmol/L (ref 135–145)

## 2016-01-14 LAB — GLUCOSE, CAPILLARY
GLUCOSE-CAPILLARY: 164 mg/dL — AB (ref 65–99)
GLUCOSE-CAPILLARY: 209 mg/dL — AB (ref 65–99)
GLUCOSE-CAPILLARY: 253 mg/dL — AB (ref 65–99)
Glucose-Capillary: 183 mg/dL — ABNORMAL HIGH (ref 65–99)
Glucose-Capillary: 238 mg/dL — ABNORMAL HIGH (ref 65–99)
Glucose-Capillary: 153 mg/dL — ABNORMAL HIGH (ref 65–99)

## 2016-01-14 LAB — CBC
HCT: 28.5 % — ABNORMAL LOW (ref 39.0–52.0)
Hemoglobin: 9.7 g/dL — ABNORMAL LOW (ref 13.0–17.0)
MCH: 29.6 pg (ref 26.0–34.0)
MCHC: 34 g/dL (ref 30.0–36.0)
MCV: 86.9 fL (ref 78.0–100.0)
PLATELETS: 103 10*3/uL — AB (ref 150–400)
RBC: 3.28 MIL/uL — ABNORMAL LOW (ref 4.22–5.81)
RDW: 14.6 % (ref 11.5–15.5)
WBC: 12.1 10*3/uL — AB (ref 4.0–10.5)

## 2016-01-14 LAB — HEPARIN LEVEL (UNFRACTIONATED)
HEPARIN UNFRACTIONATED: 1.22 [IU]/mL — AB (ref 0.30–0.70)
HEPARIN UNFRACTIONATED: 0.79 [IU]/mL — AB (ref 0.30–0.70)

## 2016-01-14 MED ORDER — KETOROLAC TROMETHAMINE 15 MG/ML IJ SOLN
15.0000 mg | Freq: Four times a day (QID) | INTRAMUSCULAR | Status: DC | PRN
  Administered 2016-01-14 – 2016-01-15 (×2): 15 mg via INTRAVENOUS
  Filled 2016-01-14 (×2): qty 1

## 2016-01-14 MED ORDER — HEPARIN (PORCINE) IN NACL 100-0.45 UNIT/ML-% IJ SOLN
1400.0000 [IU]/h | INTRAMUSCULAR | Status: DC
  Administered 2016-01-14: 1400 [IU]/h via INTRAVENOUS

## 2016-01-14 MED ORDER — DABIGATRAN ETEXILATE MESYLATE 150 MG PO CAPS
150.0000 mg | ORAL_CAPSULE | Freq: Two times a day (BID) | ORAL | Status: DC
  Administered 2016-01-14 – 2016-01-17 (×7): 150 mg via ORAL
  Filled 2016-01-14 (×8): qty 1

## 2016-01-14 MED ORDER — NIACIN 250 MG PO TABS
750.0000 mg | ORAL_TABLET | Freq: Every day | ORAL | Status: DC
  Administered 2016-01-14 – 2016-01-15 (×2): 750 mg via ORAL
  Filled 2016-01-14 (×5): qty 3

## 2016-01-14 NOTE — Progress Notes (Signed)
I await further progress with therapy to determine tolerance more receiving more intensive rehab with an inpt rehab admission. I will follow up tomorrow. [id number]

## 2016-01-14 NOTE — Progress Notes (Signed)
Manual bp- 90/53 . Cont. To monitor.

## 2016-01-14 NOTE — NC FL2 (Signed)
[place] LEVEL OF CARE SCREENING TOOL     IDENTIFICATION  Patient Name: Raymond Jacobson Birthdate: [birth year] Sex: male Admission Date (Current Location): 01/01/2016  [facility] and [place] Number:  [employer] and Address:  [facility] [place] [address], [place], [place] [address]      Provider Number: [id number]  Attending Physician Name and Address:  Elam Dutch, MD  Relative Name and Phone Number:  Andee Poles. daughter, [phone]    Current Level of Care: Hospital Recommended Level of Care: [place] Prior Approval Number:    Date Approved/Denied:   PASRR Number:    Discharge Plan: SNF    Current Diagnoses: Patient Active Problem List   Diagnosis Date Noted  . Abdominal distension   . 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 Coronary artery disease involving native coronary artery of native heart without angina pectoris   . 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 HLD (hyperlipidemia)   . Post-operative pain   . Acute blood loss anemia   . Right lower lobe pneumonia   . Diastolic dysfunction   . Tachypnea   . Tachycardia   . Hypokalemia   . Leukocytosis   . Thrombocytopenia ([place])   . Surgery, other elective   . Pain of upper abdomen   . Pneumothorax on right   . Chest trauma   . Acute respiratory failure ([place])   . Pre-operative cardiovascular examination 12/31/2015  . PAD (peripheral artery disease) ([place]) 10/15/2015  . ACS (acute coronary syndrome) ([place]) 06/20/2013  . HTN (hypertension) 04/13/2011  . Elevated lipids 04/13/2011  . CAD S/P multiple PCIs 04/13/2011    Orientation RESPIRATION BLADDER Height & Weight     Self, Time, Situation, Place  O2 (Nasal cannula 3L) Incontinent Weight: 88 kg (194 lb) Height:  5\' 8"  (172.7 cm)  BEHAVIORAL SYMPTOMS/MOOD NEUROLOGICAL BOWEL NUTRITION STATUS      Continent Diet (Please see DC Summary)  AMBULATORY STATUS COMMUNICATION OF NEEDS Skin    Extensive Assist Verbally Surgical wounds (Closed incision on groin, thigh, and leg)                       Personal Care Assistance Level of Assistance  Bathing, Feeding, Dressing Bathing Assistance: Maximum assistance Feeding assistance: Independent Dressing Assistance: Limited assistance     Functional Limitations Info             SPECIAL CARE FACTORS FREQUENCY  PT (By licensed PT)     PT Frequency: 5x/week              Contractures      Additional Factors Info  Code Status, Allergies, Insulin Sliding Scale Code Status Info: Full Allergies Info: NKA   Insulin Sliding Scale Info: insulin aspart (novoLOG) injection 2-6 Units       Current Medications (01/14/2016):  This is the current hospital active medication list Current Facility-Administered Medications  Medication Dose Route Frequency Provider Last Rate Last Dose  . 0.9 %  sodium chloride infusion   Intravenous Continuous Corey Harold, NP 10 mL/hr at 01/14/16 0700    . acetaminophen (TYLENOL) tablet 325-650 mg  325-650 mg Oral Q4H PRN Samantha J Rhyne, PA-C       Or  . acetaminophen (TYLENOL) suppository 325-650 mg  325-650 mg Rectal Q4H PRN Samantha J Rhyne, PA-C      . antiseptic oral rinse (CPC / CETYLPYRIDINIUM CHLORIDE 0.05%) solution 7  mL  7 mL Mouth Rinse q12n4p Elam Dutch, MD   7 mL at 01/13/16 1243  . atorvastatin (LIPITOR) tablet 40 mg  40 mg Oral QPM Elam Dutch, MD   40 mg at 01/13/16 1758  . bisacodyl (DULCOLAX) suppository 10 mg  10 mg Rectal Daily PRN Samantha J Rhyne, PA-C      . chlorhexidine (PERIDEX) 0.12 % solution 15 mL  15 mL Mouth Rinse BID Elam Dutch, MD   15 mL at 01/14/16 0942  . chlorthalidone (HYGROTON) tablet 12.5 mg  12.5 mg Oral Daily Elam Dutch, MD   12.5 mg at 01/13/16 1243  . diphenoxylate-atropine (LOMOTIL) 2.5-0.025 MG per tablet 1 tablet  1 tablet Oral Daily PRN Elam Dutch, MD   1 tablet at 01/13/16 1242  . docusate sodium (COLACE)  capsule 100 mg  100 mg Oral Daily Hulen Shouts Rhyne, PA-C   100 mg at 01/12/16 [id number]  . enalapril (VASOTEC) tablet 20 mg  20 mg Oral BID Elam Dutch, MD   20 mg at 01/13/16 2105  . feeding supplement (ENSURE ENLIVE) (ENSURE ENLIVE) liquid 237 mL  237 mL Oral TID BM Elam Dutch, MD   237 mL at 01/14/16 0943  . guaiFENesin-codeine 100-10 MG/5ML solution 5 mL  5 mL Oral Q6H PRN Rigoberto Noel, MD   5 mL at 01/11/16 0132  . heparin ADULT infusion 100 units/mL (25000 units/268mL sodium chloride 0.45%)  1,400 Units/hr Intravenous Continuous Lyndee Leo, RPH      . hydrALAZINE (APRESOLINE) injection 5 mg  5 mg Intravenous Q20 Min PRN Samantha J Rhyne, PA-C      . insulin aspart (novoLOG) injection 2-6 Units  2-6 Units Subcutaneous Q4H Corey Harold, NP   4 Units at 01/14/16 [phone]  . isosorbide mononitrate (IMDUR) 24 hr tablet 30 mg  30 mg Oral BID Hulen Shouts Rhyne, PA-C   30 mg at 01/13/16 2105  . labetalol (NORMODYNE,TRANDATE) injection 10 mg  10 mg Intravenous Q10 min PRN Samantha J Rhyne, PA-C      . magnesium sulfate IVPB 2 g 50 mL  2 g Intravenous Daily PRN Samantha J Rhyne, PA-C      . metoprolol (LOPRESSOR) injection 2-5 mg  2-5 mg Intravenous Q2H PRN Hulen Shouts Rhyne, PA-C   5 mg at 01/11/16 2053  . metoprolol succinate (TOPROL-XL) 24 hr tablet 100 mg  100 mg Oral Daily Elam Dutch, MD   100 mg at 01/13/16 1242  . morphine 2 MG/ML injection 2-4 mg  2-4 mg Intravenous Q1H PRN Rosetta Posner, MD   2 mg at 01/14/16 0649  . niacin tablet 750 mg  750 mg Oral QHS Elam Dutch, MD   750 mg at 01/13/16 2237  . NIFEdipine (PROCARDIA XL/ADALAT-CC) 24 hr tablet 90 mg  90 mg Oral QPM Elam Dutch, MD   90 mg at 01/13/16 1758  . nitroGLYCERIN (NITROSTAT) SL tablet 0.4 mg  0.4 mg Sublingual Q5 min PRN Elam Dutch, MD      . ondansetron [facility]) injection 4 mg  4 mg Intravenous Q6H PRN Hulen Shouts Rhyne, PA-C   4 mg at 01/11/16 0919  . oxyCODONE-acetaminophen (PERCOCET/ROXICET) 5-325 MG  per tablet 1-2 tablet  1-2 tablet Oral Q4H PRN Gabriel Earing, PA-C   1 tablet at 01/13/16 2237  . pantoprazole (PROTONIX) EC tablet 40 mg  40 mg Oral Daily Elam Dutch, MD   40 mg  at 01/14/16 0940  . phenol (CHLORASEPTIC) mouth spray 1 spray  1 spray Mouth/Throat PRN Samantha J Rhyne, PA-C      . potassium chloride (K-DUR,KLOR-CON) CR tablet 30 mEq  30 mEq Oral BID Elam Dutch, MD   30 mEq at 01/14/16 0941  . potassium chloride (KLOR-CON) packet 40 mEq  40 mEq Oral Once Elam Dutch, MD         Discharge Medications: Please see discharge summary for a list of discharge medications.  Relevant Imaging Results:  Relevant Lab Results:   Additional Information SSN: [id number] [place], [place]

## 2016-01-14 NOTE — Progress Notes (Signed)
Patient transferred to 2W. Handoff given to 2W CSW.  This CSW signing off.  Percell Locus Jerrie Schussler LCSWA [phone]

## 2016-01-14 NOTE — Progress Notes (Signed)
Patient oriented to room and equipment, telemetry applied, CCMD notified, vitals taken, PA aware of blood pressure, significant other at bedside and call light within reach.

## 2016-01-14 NOTE — Progress Notes (Signed)
PT Cancellation Note  Patient Details Name: Raymond Jacobson MRN: [id number] DOB: 1940/11/02   Cancelled Treatment:    Reason Eval/Treat Not Completed: Patient not medically ready   Patient with low BP 71/57, 66/50 with RN unable to give pain medicine due to hypotension. Patient insists he must have pain medicine before he can work with therapy. Will attempt to see later today.   [name] 01/14/2016, 9:32 AM  Pager [phone]

## 2016-01-14 NOTE — Clinical Social Work Note (Signed)
Clinical Social Work Assessment  Patient Details  Name: Raymond Jacobson MRN: [id number] Date of Birth: 1940-06-18  Date of referral:  01/14/16               Reason for consult:  Facility Placement                Permission sought to share information with:  Facility Sport and exercise psychologist, Family Supports Permission granted to share information::  Yes, Verbal Permission Granted  Name::     Chemical engineer::  SNFs  Relationship::  Daughter  Contact Information:  [phone]  Housing/Transportation Living arrangements for the past 2 months:  [place] of Information:  Patient Patient Interpreter Needed:  None Criminal Activity/Legal Involvement Pertinent to Current Situation/Hospitalization:  No - Comment as needed Significant Relationships:  Adult Children, Significant Other Lives with:  Significant Other Do you feel safe going back to the place where you live?  No Need for family participation in patient care:  Yes (Comment)  Care giving concerns:  CSW received referral for possible SNF placement at time of discharge if [facility] is unable to admit. CSW met with patient and his significant other at bedside regarding PT recommendation of SNF placement at time of discharge. Per patient and his family, they prefer patient to go to [facility]. Patient and patient's family expressed understanding of PT recommendation and are agreeable to SNF placement at time of discharge. CSW to continue to follow and assist with discharge planning needs.   Social Worker assessment / plan:  CSW spoke with patient and patient's significant other concerning possibility of rehab at [facility] before returning home.  Employment status:  Retired Forensic scientist:  Information systems manager, [employer] Benefit PT Recommendations:  Inpatient [place], [place] / Referral to community resources:  [place]  Patient/Family's Response to care:  Patient and patient's significant other  recognize need for rehab before returning home and are agreeable to a SNF in [place]. Patient reported preference for [facility]. CSW contacted [facility] and explained to family that they do not have SNF facilities in that area, and that patient can go to local SNF under Medicare part A.  Patient/Family's Understanding of and Emotional Response to Diagnosis, Current Treatment, and Prognosis:  Patient distracted from conversation by leg pain. Patient/family is realistic regarding therapy needs and expressed being hopeful for SNF placement. No questions/concerns about plan or treatment.    Emotional Assessment Appearance:  Appears stated age Attitude/Demeanor/Rapport:  Other (Appropriate) Affect (typically observed):  Accepting, Appropriate Orientation:  Oriented to Situation, Oriented to  Time, Oriented to Place, Oriented to Self Alcohol / Substance use:  Not Applicable Psych involvement (Current and /or in the community):  No (Comment)  Discharge Needs  Concerns to be addressed:  Care Coordination Readmission within the last 30 days:  No Current discharge risk:  None Barriers to Discharge:  Continued Medical Work up   [employer], [place] 01/14/2016, 9:48 AM

## 2016-01-14 NOTE — Progress Notes (Signed)
Addressed concerns of daughter regarding vitamin B and D labs for the morning along with iron. Case manager will be calling her tomorrow to discuss the plans for inpatient rehab.

## 2016-01-14 NOTE — Progress Notes (Signed)
Md made aware about the  Bp- [id number] systolic and meds for BP not given. Pt is asymptomatic. Still oozing on left stump incision site, pressure dressing applied, , kerlix and ace wrap done. Continue to monitor.

## 2016-01-14 NOTE — Progress Notes (Addendum)
Progress Note    01/14/2016 7:49 AM 7 Days Post-Op  Subjective:  Had pain in the front portion of his stump last night  Afebrile HR  90's-110's NSR/ST [id number] systolic 0000000 0000000  Vitals:   01/14/16 0400 01/14/16 0730  BP: (!) 81/58 (!) 82/53  Pulse: 96 94  Resp: 17   Temp:      Physical Exam: Cardiac:  Regular Lungs:  Non labored Incisions:  Bloody ooze from mid thigh incision; other incisions are clean and dry; stump incision clean with staples in tact.  Some ecchymosis on posterior leg. Extremities:  Able to move stump well.   CBC    Component Value Date/Time   WBC 12.1 (H) 01/14/2016 0215   RBC 3.28 (L) 01/14/2016 0215   HGB 9.7 (L) 01/14/2016 0215   HCT 28.5 (L) 01/14/2016 0215   PLT 103 (L) 01/14/2016 0215   MCV 86.9 01/14/2016 0215   MCH 29.6 01/14/2016 0215   MCHC 34.0 01/14/2016 0215   RDW 14.6 01/14/2016 0215   LYMPHSABS 1.2 10/01/2015 0901   MONOABS 0.4 10/01/2015 0901   EOSABS 0.1 10/01/2015 0901   BASOSABS 0.1 10/01/2015 0901    BMET    Component Value Date/Time   NA 135 01/14/2016 0215   K 3.2 (L) 01/14/2016 0215   CL 99 (L) 01/14/2016 0215   CO2 27 01/14/2016 0215   GLUCOSE 181 (H) 01/14/2016 0215   BUN 23 (H) 01/14/2016 0215   CREATININE 1.20 01/14/2016 0215   CALCIUM 7.9 (L) 01/14/2016 0215   GFRNONAA 57 (L) 01/14/2016 0215   GFRAA >60 01/14/2016 0215    INR    Component Value Date/Time   INR 1.00 01/01/2016 0705     Intake/Output Summary (Last 24 hours) at 01/14/16 0749 Last data filed at 01/14/16 0700  Gross per 24 hour  Intake           1256.4 ml  Output              800 ml  Net            456.4 ml     Assessment:  75 y.o. male is s/p:   Left femoral endarterectomy, Left femoral to above-knee popliteal bypass & Thrombectomy of left femoropopliteal bypass, intraoperative arteriogram 13 days post op And Left femoral to above-knee popliteal bypass with Propaten & Thrombectomy and revision of distal anastomosis of  left femoral to above-knee Gore-Tex bypass 9 days ago And Left above knee amputation 7 Days Post-Op And Insertion CT right 01/08/16   Plan: -pt's stump is viable -continues to have a bloody ooze from mid thigh incision.  Hgb down 1gm. -leukocytosis improved from 13.8 to 12.1 today -DVT prophylaxis:  Continue heparin for now in case he needs washout of left thigh incision. -blood pressure cuff this am reading 66 systolic-manual BP taken with systolic in the [id number].  -still hypokalemic, but slightly improved from yesterday-continue to supplement -continue to push nutrition -will order stump sock, but may want to wait to use it as he is having a bloody ooze from thigh incision. -dry gauze to left groin incision -labs in am   Leontine Locket, PA-C [facility] [phone] 01/14/2016 7:49 AM  Dressing with some bloody staining but none expressible left medial thigh. Leukocytosis trending down. Aka incision healing no erythema Hypokalemia continue to replete O2 at 4 liters no dyspnea Eating some protein calorie malnutrition continue to urge to eat Daily kerlix and ACE for AKA Restart pradaxa Transfer  2w Hopefully to Rehab in the next few days  Ruta Hinds, MD [facility] Office: [phone] Pager: [phone]

## 2016-01-14 NOTE — Progress Notes (Signed)
Transferred to [place] room 37 by bed, stable, report given to RN. Belongings taken by spouse.

## 2016-01-14 NOTE — Progress Notes (Addendum)
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 + new PE  Patient Measurements: Height: 5\' 8"  (172.7 cm) Weight: 194 lb (88 kg) IBW/kg (Calculated) : 68.4 Heparin Dosing Weight: 86.5 kg  Vital Signs: Temp: 98.4 F (36.9 C) (08/15 0349) Temp Source: Axillary (08/15 0349) BP: 81/58 (08/15 0400) Pulse Rate: 96 (08/15 0400)  Labs:  Recent Labs  01/12/16 0621  01/13/16 0348  01/13/16 2108 01/13/16 2142 01/14/16 0215 01/14/16 0627  HGB 10.4*  --  10.8*  --   --   --  9.7*  --   HCT 31.5*  --  33.4*  --   --   --  28.5*  --   PLT 144*  --  147*  --   --   --  103*  --   APTT  --   --   --   --  >200*  --   --   --   HEPARINUNFRC 0.13*  < > >2.20*  < >  --  >2.20* 1.22* 0.79*  CREATININE 1.20  --  1.24  --   --   --  1.20  --   < > = values in this interval not displayed.  Estimated Creatinine Clearance: 57.3 mL/min (by C-G formula based on SCr of 1.2 mg/dL).   Assessment: 75 yo male with h/o AFib, Pradaxa PTA on hold.  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  Per critical care note on 8/10 - would prefer to keep him at the higher end of therapeutic range but struggle getting him therapeutic over weekend - requiring much more heparin than last week and HL still low. Some hematuria/oozing from groin surgical site 8/13 appears resolved.   Heparin level now trending down but still above goal at 0.79. Heparin has been off since yesterday d/t high levels? Hgb down, bloody oozing noted from thigh wound. VVS plans to hold off on pradaxa for now in case washout is necessary.   Will continue to monitor closely and adjust rate. Will follow up VVS plans regarding pradaxa resumption.  Goal of Therapy:  Heparin level 0.3-0.7 units/ml Monitor platelets by anticoagulation protocol: Yes   Plan:  Restart heparin at 1400 units/hr (previous therapeutic  rate) delay start till 10am Check HL in 8 hours Follow up pradaxa restart  Erin Hearing PharmD., BCPS Clinical Pharmacist 01/14/2016 7:45 AM

## 2016-01-14 NOTE — Discharge Instructions (Signed)

## 2016-01-14 NOTE — PMR Pre-admission (Signed)
PMR Admission Coordinator Pre-Admission Assessment  Patient: Raymond Jacobson is an 75 y.o., male MRN: [id number] DOB: 1940-06-30 Height: 5\' 8"  (172.7 cm) Weight: 88 kg (194 lb)              Insurance Information HMO:     PPO:      PCP:      IPA:      80/20: yes     OTHER: no HMO PRIMARY: Medicare A only      Policy#: [id number]      Subscriber: pt Benefits:  Phone #: Passport one online     Name: 01/15/16 Eff. Date: 01/30/05     Deduct: $1316      Out of Pocket Max: none      Life Max: none CIR: 100%      SNF: 20 full days Outpatient: 80%     Co-Pay: 20% Home Health: 100%      Co-Pay: none DME: 80%     Co-Pay: 20% Providers: pt choice  SECONDARY: VA choice      Policy#: [id number]      Subscriber: pt  Medicaid Application Date:       Case Manager:  Disability Application Date:       Case Worker:   Emergency Contact Information Contact Information    Name Relation Home Work Mobile   [place] Daughter [phone]     Lynelle Smoke [phone]  [phone]   [place], [place] [phone]       Current Medical History  Patient Admitting Diagnosis: Left AKA  History of Present Illness:A 75 y.o.malewith history of CAD, GERD, A fib, cough, OA,  PVD with  claudication of the LLE affecting QOL. He was admitted on 01/01/16 for  left femoral endarterectomy and left femoral to above knee popliteal bypass by Dr. Oneida Alar. Post op developed ischemia due to occlusion of graft and was taken back to OR that afternoon and repeat procedure on 08/07 for thrombectomy. He continued to have severe progressive ischemia and AKA recommended due to lack of options. On 08/08 patient underwent L-AKA without complications. Post op had confusion as well as abdominal distension due to ileus and large R- tension PTX noted on 08/9. Chest tube placed at bedside and NGT placed for decompression of abdomen per Dr. Hulen Skains. He developed hypoxia with N/V and concerns of aspiration PNA with RML consolidation  and was started on IV antibiotics. PCCM consulted and he was treated with IV lasix for mild pulmonary edema and continued on  IV heparin. He had progressive dyspnea requiring NRB and was transferred to ICU on 8/10. CTA chest was positive for saddle PE, Left PE with large clot burden and right heart strain. EKOS held off due to risk of bleeding complication. BLE dopplers negative for DVT.  Vanc/Zosyn discontinued on 08/11 and chest tube removed on 08/13.  Abdominal function improving and NGT discontinued. Diet advanced to regular and patient continues to have loose stools. He developed hypotension with acute renal failure with rise in BUN/Cr-  34/1.61.  He was started on IVF and duretics/ACE discontinued 8/16 with improvement. Dr. Oneida Alar recommends holding meds till SBP> 160. Anxiety levels are improving but he continues to have dyspnea/hypoxia with activity. CIR recommended by MD and rehab team.    Past Medical History  Past Medical History:  Diagnosis Date  . Angina    last time >yr  . Arthritis    "knees, ankles" (10/15/2015)  . Basal cell carcinoma of scalp    "froze off"  .  CAD (coronary artery disease)    Previous stents last 6 years ago [place] CT  . Complication of anesthesia   . Cough    recent bronchitis- dry cough  . Dry cough   . DVT (deep venous thrombosis) (HCC) ~ 1969   LLE  . Elevated lipids   . GERD (gastroesophageal reflux disease)    occ -tums  . High cholesterol   . History of hiatal hernia    hx  . Hypertension   . Myocardial infarction [facility]) ~ 2006   "blood work"  . Peripheral vascular disease ([place])   . PONV (postoperative nausea and vomiting)   . Shortness of breath 04/13/11   "mostly w/exertion"/ now with bronchitis    Family History  family history includes Cancer in his father; Heart attack in his paternal grandfather.  Prior Rehab/Hospitalizations:  Has the patient had major surgery during 100 days prior to admission? Yes  Current Medications    Current Facility-Administered Medications:  .  0.9 %  sodium chloride infusion, , Intravenous, Continuous, Alvia Grove, PA-C, Last Rate: 50 mL/hr at 01/17/16 1004 .  acetaminophen (TYLENOL) tablet 325-650 mg, 325-650 mg, Oral, Q4H PRN, 650 mg at 01/16/16 0406 **OR** acetaminophen (TYLENOL) suppository 325-650 mg, 325-650 mg, Rectal, Q4H PRN, Samantha J Rhyne, PA-C .  antiseptic oral rinse (CPC / CETYLPYRIDINIUM CHLORIDE 0.05%) solution 7 mL, 7 mL, Mouth Rinse, q12n4p, Elam Dutch, MD, 7 mL at 01/17/16 1200 .  atorvastatin (LIPITOR) tablet 40 mg, 40 mg, Oral, QPM, Elam Dutch, MD, 40 mg at 01/16/16 1714 .  bisacodyl (DULCOLAX) suppository 10 mg, 10 mg, Rectal, Daily PRN, Samantha J Rhyne, PA-C .  chlorhexidine (PERIDEX) 0.12 % solution 15 mL, 15 mL, Mouth Rinse, BID, Elam Dutch, MD, 15 mL at 01/17/16 1013 .  dabigatran (PRADAXA) capsule 150 mg, 150 mg, Oral, BID, Elam Dutch, MD, 150 mg at 01/17/16 1013 .  diphenoxylate-atropine (LOMOTIL) 2.5-0.025 MG per tablet 1 tablet, 1 tablet, Oral, Daily PRN, Elam Dutch, MD, 1 tablet at 01/13/16 1242 .  feeding supplement (ENSURE ENLIVE) (ENSURE ENLIVE) liquid 237 mL, 237 mL, Oral, TID BM, Elam Dutch, MD, 237 mL at 01/17/16 1000 .  guaiFENesin-codeine 100-10 MG/5ML solution 5 mL, 5 mL, Oral, Q6H PRN, Rigoberto Noel, MD, 5 mL at 01/11/16 0132 .  hydrALAZINE (APRESOLINE) injection 5 mg, 5 mg, Intravenous, Q20 Min PRN, Samantha J Rhyne, PA-C .  insulin aspart (novoLOG) injection 2-6 Units, 2-6 Units, Subcutaneous, Q4H, Corey Harold, NP, 2 Units at 01/16/16 2048 .  labetalol (NORMODYNE,TRANDATE) injection 10 mg, 10 mg, Intravenous, Q10 min PRN, Samantha J Rhyne, PA-C .  magnesium sulfate IVPB 2 g 50 mL, 2 g, Intravenous, Daily PRN, Samantha J Rhyne, PA-C .  metoprolol (LOPRESSOR) injection 2-5 mg, 2-5 mg, Intravenous, Q2H PRN, Hulen Shouts Rhyne, PA-C, 5 mg at 01/11/16 2053 .  metoprolol succinate (TOPROL-XL) 24 hr tablet 100  mg, 100 mg, Oral, Daily, Janalyn Harder Trinh, PA-C, 100 mg at 01/17/16 1131 .  morphine 2 MG/ML injection 2-4 mg, 2-4 mg, Intravenous, Q1H PRN, Rosetta Posner, MD, 2 mg at 01/14/16 0649 .  niacin CR capsule 750 mg, 750 mg, Oral, QHS, Sheema M Hallaji, RPH, 750 mg at 01/16/16 2154 .  nitroGLYCERIN (NITROSTAT) SL tablet 0.4 mg, 0.4 mg, Sublingual, Q5 min PRN, Elam Dutch, MD .  ondansetron [facility]) injection 4 mg, 4 mg, Intravenous, Q6H PRN, Hulen Shouts Rhyne, PA-C, 4 mg at 01/11/16 0919 .  oxyCODONE-acetaminophen (PERCOCET/ROXICET) 5-325 MG per tablet 1-2 tablet, 1-2 tablet, Oral, Q4H PRN, Hulen Shouts Rhyne, PA-C, 1 tablet at 01/13/16 2237 .  pantoprazole (PROTONIX) EC tablet 40 mg, 40 mg, Oral, Daily, Elam Dutch, MD, 40 mg at 01/17/16 1011 .  phenol (CHLORASEPTIC) mouth spray 1 spray, 1 spray, Mouth/Throat, PRN, Samantha J Rhyne, PA-C .  potassium chloride SA (K-DUR,KLOR-CON) CR tablet 40 mEq, 40 mEq, Oral, BID, Elam Dutch, MD, 40 mEq at 01/17/16 1010  Patients Current Diet: Diet Carb Modified Fluid consistency: Thin; Room service appropriate? Yes  Precautions / Restrictions Precautions Precautions: Fall Precaution Comments: chest tube, NG tube Restrictions Weight Bearing Restrictions: Yes LLE Weight Bearing: Non weight bearing   Has the patient had 2 or more falls or a fall with injury in the past year?No  Prior Activity Level Community (5-7x/wk): very active, until 2 mos pta. bronchitis, fatigue and pain r LE limiting   pt very active gardening, bow master, pickle ball player until 2 months pta, limited by SOB and treated for bronchitis. Also limited by RLE pain  Home Assistive Devices / Equipment Home Assistive Devices/Equipment: Eyeglasses, Dentures (specify type) Home Equipment: None  Prior Device Use: Indicate devices/aids used by the patient prior to current illness, exacerbation or injury? None of the above  Prior Functional Level Prior Function Level of  Independence: Independent Comments: pickle ball player, bow marksman, gardining  Self Care: Did the patient need help bathing, dressing, using the toilet or eating?  Independent  Indoor Mobility: Did the patient need assistance with walking from room to room (with or without device)? Independent  Stairs: Did the patient need assistance with internal or external stairs (with or without device)? Independent  Functional Cognition: Did the patient need help planning regular tasks such as shopping or remembering to take medications? Independent  Current Functional Level Cognition  Overall Cognitive Status: Within Functional Limits for tasks assessed Orientation Level: Oriented X4    Extremity Assessment (includes Sensation/Coordination)  Upper Extremity Assessment: Generalized weakness  Lower Extremity Assessment: Defer to PT evaluation LLE Deficits / Details: pt reporting no phantom pain currently in left residual limb.  He was able to move it with trace contraction into hip flexion, abduction and extension assessment limited by pain, and his position in the bed.     ADLs  Overall ADL's : Needs assistance/impaired Eating/Feeding: NPO Grooming: Wash/dry hands, Oral care, Wash/dry face, Minimal assistance, Bed level Upper Body Bathing: Moderate assistance, Bed level Lower Body Bathing: Total assistance, Bed level Upper Body Dressing : Maximal assistance, Bed level Lower Body Dressing: Total assistance, Sit to/from stand Functional mobility during ADLs: +2 for physical assistance, Moderate assistance    Mobility  Overal bed mobility: Needs Assistance Bed Mobility: Supine to Sit Rolling: Supervision (heavy use of rail, used momentum to sit up.) Sidelying to sit: Mod assist, +2 for safety/equipment, HOB elevated Supine to sit: Supervision (heavy use of rail, used momentum to sit up) Sit to supine: Min assist, +2 for safety/equipment General bed mobility comments: Incr time due to  dyspnea; heavy use of rail; pt somewhat impulsive.    Transfers  Overall transfer level: Needs assistance Equipment used: Rolling walker (2 wheeled) Transfers: Sit to/from Stand Sit to Stand: Min guard, +2 safety/equipment Stand pivot transfers: Min guard, +2 physical assistance, +2 safety/equipment General transfer comment: Pt does not want PT to assist but explained to pt PT must make sure he is safe.  Pt impulsive needing cues and close guard assist for safety.  Ambulation / Gait / Stairs / Wheelchair Mobility  Ambulation/Gait Ambulation/Gait assistance: Mod assist, Min assist, +2 safety/equipment Ambulation Distance (Feet): 15 Feet Assistive device: Rolling walker (2 wheeled) Gait Pattern/deviations: Step-to pattern, Antalgic General Gait Details: Pt was able to walk around bed needing assist with walker sequencing.  Took multiple standing rest breaks due to dyspnea but less breaks than yesterday.   Pt moves impulsively and needs cues for safety awareness.   Gait velocity: decreased Gait velocity interpretation: Below normal speed for age/gender Stairs: Yes Stairs assistance: Supervision Stair Management: One rail Right, Step to pattern Number of Stairs: 4    Posture / Balance Dynamic Sitting Balance Sitting balance - Comments: Able to sit EOB with 1 UE support.  Balance Overall balance assessment: Needs assistance, History of Falls Sitting-balance support: No upper extremity supported, Feet supported Sitting balance-Leahy Scale: Fair Sitting balance - Comments: Able to sit EOB with 1 UE support.  Postural control: Posterior lean Standing balance support: Bilateral upper extremity supported, During functional activity Standing balance-Leahy Scale: Poor Standing balance comment: Needs RW for support High level balance activites: Turns, Direction changes High Level Balance Comments: Needed min assist and cues for safety with sequencing steps and RW.     Special needs/care  consideration BiPAP/CPAP no Continuous Drip IV no Oxygen did not use O2 pta, now on 2 liters Special Bed no Skin* Left BKA incision                               Bowel mgmt: Last BM 01/17/16 Bladder mgmt: Urinary catheter in place Diabetic mgmt pt states new dx this admission   Previous Home Environment Living Arrangements: Alone Available Help at Discharge: Other (Comment) (pt's "lady friend" local and dtr from [place] ) Type of Home: House Home Layout: One level Home Access: Stairs to enter Entrance Stairs-Rails: Can reach both Entrance Stairs-Number of Steps: 2 Bathroom Shower/Tub: Government social research officer Accessibility: No Home Care Services: No Additional Comments: Daughter in room during PT evaluation.   Discharge Living Setting Plans for Discharge Living Setting:  (to go stay with girlfriend, Raquel Sarna) Type of Home at Discharge: House Discharge Home Layout: One level Discharge Home Access: Level entry Discharge Bathroom Shower/Tub: Tub/shower unit, Walk-in shower (has both) Discharge Bathroom Toilet: Standard Discharge Bathroom Accessibility: Yes How Accessible: Accessible via walker Does the patient have any problems obtaining your medications?: No  Social/Family/Support Systems Patient Roles: Partner, Parent Contact Information: friend and dtr Anticipated Caregiver: friend and dtr Anticipated Caregiver's Contact Information: see above Ability/Limitations of Caregiver: dtr lives in [place]. visitng at intervals. Friend available Discharge Plan Discussed with Primary Caregiver: Yes Is Caregiver In Agreement with Plan?: Yes Does Caregiver/Family have Issues with Lodging/Transportation while Pt is in Rehab?: No  Goals/Additional Needs Patient/Family Goal for Rehab: supervision to min assist PT and OT Expected length of stay: ELOS 12-16 days Pt/Family Agrees to Admission and willing to participate: Yes Program Orientation Provided &  Reviewed with Pt/Caregiver Including Roles  & Responsibilities: Yes  Decrease burden of Care through IP rehab admission: n/a  Possible need for SNF placement upon discharge:not anticipated  Patient Condition: This patient's medical and functional status has changed since the consult dated: 01/13/2016 in which the Rehabilitation Physician determined and documented that the patient's condition is appropriate for intensive rehabilitative care in an inpatient rehabilitation facility. See "History of Present Illness" (above) for medical update. Functional changes are: Currently requiring min  to mod assist to ambulate 15 feet RW + 2 assist safety/equipment. Patient's medical and functional status update has been discussed with the Rehabilitation physician and patient remains appropriate for inpatient rehabilitation. Will admit to inpatient rehab today.  Preadmission Screen Completed By:  Retta Diones, 01/17/2016 1:36 PM ______________________________________________________________________   Discussed status with Dr. Naaman Plummer on 01/17/16 at 1336 and received telephone approval for admission today.  Admission Coordinator:  Retta Diones, time1336/Date08/18/17

## 2016-01-15 LAB — CBC
HCT: 28.8 % — ABNORMAL LOW (ref 39.0–52.0)
Hemoglobin: 9.2 g/dL — ABNORMAL LOW (ref 13.0–17.0)
MCH: 28.7 pg (ref 26.0–34.0)
MCHC: 31.9 g/dL (ref 30.0–36.0)
MCV: 89.7 fL (ref 78.0–100.0)
PLATELETS: 169 10*3/uL (ref 150–400)
RBC: 3.21 MIL/uL — AB (ref 4.22–5.81)
RDW: 14.9 % (ref 11.5–15.5)
WBC: 11 10*3/uL — ABNORMAL HIGH (ref 4.0–10.5)

## 2016-01-15 LAB — BASIC METABOLIC PANEL
Anion gap: 8 (ref 5–15)
BUN: 41 mg/dL — ABNORMAL HIGH (ref 6–20)
CHLORIDE: 96 mmol/L — AB (ref 101–111)
CO2: 26 mmol/L (ref 22–32)
CREATININE: 2.27 mg/dL — AB (ref 0.61–1.24)
Calcium: 8.1 mg/dL — ABNORMAL LOW (ref 8.9–10.3)
GFR calc non Af Amer: 27 mL/min — ABNORMAL LOW (ref >60)
GFR, EST AFRICAN AMERICAN: 31 mL/min — AB (ref >60)
Glucose, Bld: 207 mg/dL — ABNORMAL HIGH (ref 65–99)
Potassium: 3.6 mmol/L (ref 3.5–5.1)
Sodium: 130 mmol/L — ABNORMAL LOW (ref 135–145)

## 2016-01-15 LAB — GLUCOSE, CAPILLARY
GLUCOSE-CAPILLARY: 162 mg/dL — AB (ref 65–99)
Glucose-Capillary: 132 mg/dL — ABNORMAL HIGH (ref 65–99)
Glucose-Capillary: 160 mg/dL — ABNORMAL HIGH (ref 65–99)
Glucose-Capillary: 167 mg/dL — ABNORMAL HIGH (ref 65–99)
Glucose-Capillary: 170 mg/dL — ABNORMAL HIGH (ref 65–99)

## 2016-01-15 LAB — FERRITIN: Ferritin: 977 ng/mL — ABNORMAL HIGH (ref 24–336)

## 2016-01-15 LAB — VITAMIN B12: Vitamin B-12: 284 pg/mL (ref 180–914)

## 2016-01-15 MED ORDER — SODIUM CHLORIDE 0.9 % IV SOLN
INTRAVENOUS | Status: DC
  Administered 2016-01-15 – 2016-01-17 (×4): via INTRAVENOUS

## 2016-01-15 NOTE — Progress Notes (Signed)
[facility] [place]  Subjective  - maybe a little less pain today   Objective (!) 83/46 87 98.7 F (37.1 C) (Oral) (!) 22 97%  Intake/Output Summary (Last 24 hours) at 01/15/16 0745 Last data filed at 01/14/16 1600  Gross per 24 hour  Intake              360 ml  Output              450 ml  Net              -90 ml   Left AKA and groin continue to heal Still some intermittent bloody drainage from left mid thigh incision Still requiring 4L O2 subjectively no dyspnea  Assessment/Planning: Hold all BP meds until SBP crosses 0000000 systolic Follow up labwork currently pending OOB moblize hopefully rehab by the end of the week Protein calorie malnutrition appetite improving continue to supplement PE now on pradaxa that he was on at home for cardiac issues, saline lock IV, wean O2 for sat > 95%  Ruta Hinds 01/15/2016 7:45 AM --  Laboratory Lab Results:  Recent Labs  01/13/16 0348 01/14/16 0215  WBC 13.8* 12.1*  HGB 10.8* 9.7*  HCT 33.4* 28.5*  PLT 147* 103*   BMET  Recent Labs  01/13/16 0348 01/14/16 0215  NA 136 135  K 3.0* 3.2*  CL 94* 99*  CO2 29 27  GLUCOSE 205* 181*  BUN 23* 23*  CREATININE 1.24 1.20  CALCIUM 8.3* 7.9*    COAG Lab Results  Component Value Date   INR 1.00 01/01/2016   INR 1.72 (H) 12/24/2015   INR 1.06 10/01/2015   No results found for: PTT

## 2016-01-15 NOTE — Progress Notes (Signed)
I met with pt and his girlfriend at bedside. Discussed also with P.T. Who has worked with pt twice today and pt tolerated activiity around the bed well. I explained that pt is a candidate to admit to inpt rehab once medically ready this week. We will follow up tomorrow. Karene Fry will follow up in my absence Thursday and Friday and can be reached at [phone]. I have discussed with RN CM. [phone]

## 2016-01-15 NOTE — Progress Notes (Signed)
   01/15/16 1221  PT Visit Information  Last PT Received On 01/15/16  Assistance Needed +2 (lines/anxiety)  History of Present Illness 75 y.o. male admitted to [facility] on 01/01/16 for L leg claudication symptoms.  He underwent L femoral endarterectomy and left femoral to above knee popliteal bypass on 01/01/16.  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  NGT placed due to post op ileus and incidental finding of R PTX, so R CT placed on 01/08/16 -01/12/16.  Pt with significant PMHx of SOB, PVD, MI, HTN, DVT, and CAD.  Subjective Data  Subjective "My butt hurts."  Precautions  Precautions Fall  Restrictions  Weight Bearing Restrictions Yes  Pain Assessment  Pain Assessment Faces  Faces Pain Scale 6  Pain Location LLE and buttocks  Pain Descriptors / Indicators Constant;Aching  Pain Intervention(s) Limited activity within patient's tolerance;Monitored during session;Repositioned;Patient requesting pain meds-RN notified  Cognition  Arousal/Alertness Awake/alert  Behavior During Therapy Anxious  Overall Cognitive Status Within Functional Limits for tasks assessed  Bed Mobility  Overal bed mobility Needs Assistance  Bed Mobility Sit to Supine  Rolling Min assist  Sit to supine Min assist;+2 for safety/equipment  General bed mobility comments Incr time due to dyspnea; heavy use of rail and a little assist to move LES  Transfers  Overall transfer level Needs assistance  Equipment used Rolling walker (2 wheeled)  Transfers Sit to/from Stand  Sit to Stand Mod assist;+2 physical assistance  General transfer comment More assist needed from low recliner  Ambulation/Gait  Ambulation/Gait assistance Mod assist;+2 safety/equipment  Ambulation Distance (Feet) 15 Feet  Assistive device Rolling walker (2 wheeled)  Gait Pattern/deviations Step-to pattern  General Gait Details Pt was able to walk around bed needing assist with walker sequencing.   Took multiple standing rest breaks due to dyspnea. Pt desat to 85% on 3LO2 but came up to 90% within 2 min.    Gait velocity decreased  Gait velocity interpretation Below normal speed for age/gender  Balance  Overall balance assessment Needs assistance  Sitting-balance support No upper extremity supported;Feet supported  Sitting balance-Leahy Scale Fair  Standing balance support Bilateral upper extremity supported;During functional activity  Standing balance-Leahy Scale Poor  Standing balance comment Needs RW for stabililty   General Comments  General comments (skin integrity, edema, etc.) wife pushed chair behind pt for safety.   PT - End of Session  Equipment Utilized During Treatment Gait belt;Oxygen  Activity Tolerance Patient limited by fatigue  Patient left with family/visitor present;in bed;with bed alarm set;with call bell/phone within reach  Nurse Communication Mobility status  PT - Assessment/Plan  PT Plan Current plan remains appropriate  PT Frequency (ACUTE ONLY) Min 3X/week  Recommendations for Other Services Rehab consult  Follow Up Recommendations CIR  PT equipment Rolling walker with 5" wheels;3in1 (PT);Wheelchair (measurements PT);Wheelchair cushion (measurements PT);Other (comment)  PT Goal Progression  Progress towards PT goals Progressing toward goals  PT Time Calculation  PT Start Time (ACUTE ONLY) 1136  PT Stop Time (ACUTE ONLY) 1150  PT Time Calculation (min) (ACUTE ONLY) 14 min  PT General Charges  $$ ACUTE PT VISIT 1 Procedure  PT Treatments  $Gait Training 8-22 mins  [facility] Acute Rehabilitation [phone] (pager)

## 2016-01-15 NOTE — Progress Notes (Signed)
Physical Therapy Treatment Patient Details Name: Raymond Jacobson MRN: [id number] DOB: [birth year] Today's Date: 01/15/2016    History of Present Illness 75 y.o. male admitted to [facility] on 01/01/16 for L leg claudication symptoms.  He underwent L femoral endarterectomy and left femoral to above knee popliteal bypass on 01/01/16.  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  NGT placed due to post op ileus and incidental finding of R PTX, so R CT placed on 01/08/16 -01/12/16.  Pt with significant PMHx of SOB, PVD, MI, HTN, DVT, and CAD.    PT Comments    Pt admitted with above diagnosis. Pt currently with functional limitations due to balance and endurance deficits. Pt was able to take some steps today.  Incr dyspnea noted with sats decreasing.  Practice pursed lip breathing and incentive spirometer.  Pt progressing. Ready for Rehab.  Pt will benefit from skilled PT to increase their independence and safety with mobility to allow discharge to the venue listed below.    Follow Up Recommendations  CIR     Equipment Recommendations  Rolling walker with 5" wheels;3in1 (PT);Wheelchair (measurements PT);Wheelchair cushion (measurements PT);Other (comment)    Recommendations for Other Services Rehab consult     Precautions / Restrictions Precautions Precautions: Fall Restrictions Weight Bearing Restrictions: Yes    Mobility  Bed Mobility Overal bed mobility: Needs Assistance Bed Mobility: Rolling;Sidelying to Sit Rolling: Mod assist Sidelying to sit: Mod assist;+2 for safety/equipment;HOB elevated       General bed mobility comments: Incr time due to dyspnea; heavy use of rail and a little assist to move LES  Transfers Overall transfer level: Needs assistance Equipment used: Rolling walker (2 wheeled) Transfers: Sit to/from Stand Sit to Stand: Min assist;+2 safety/equipment         General transfer comment: Pt needed assist to  power up.  Ambulation/Gait Ambulation/Gait assistance: Mod assist;+2 safety/equipment Ambulation Distance (Feet): 8 Feet (5 steps forward and 3 back to chair) Assistive device: Rolling walker (2 wheeled) Gait Pattern/deviations: Step-to pattern   Gait velocity interpretation: Below normal speed for age/gender General Gait Details: Pt was able to take a few steps needing assist with walker sequencing.   Stairs            Wheelchair Mobility    Modified Rankin (Stroke Patients Only)       Balance Overall balance assessment: Needs assistance Sitting-balance support: No upper extremity supported;Feet supported Sitting balance-Leahy Scale: Fair     Standing balance support: Bilateral upper extremity supported;During functional activity Standing balance-Leahy Scale: Poor Standing balance comment: Needs RW for support                    Cognition Arousal/Alertness: Awake/alert Behavior During Therapy: Anxious Overall Cognitive Status: Within Functional Limits for tasks assessed                      Exercises General Exercises - Lower Extremity Ankle Circles/Pumps: AROM;Right;10 reps Heel Slides: AROM;Strengthening;Right;5 reps Hip Flexion/Marching: AROM;Right;5 reps;Seated Amputee Exercises Quad Sets: AROM;Right;5 reps Hip ABduction/ADduction: AROM;Left;5 reps;Supine Hip Flexion/Marching: AROM;Left;Other reps (comment)    General Comments        Pertinent Vitals/Pain Pain Assessment: Faces Faces Pain Scale: Hurts little more Pain Location: LLE Pain Descriptors / Indicators: Constant;Aching Pain Intervention(s): Limited activity within patient's tolerance;Monitored during session;Repositioned  O2 at rest on RA 83-84%.  Reapplied O2 at 3L and sats 92-93%.  With activity sats 78-84% with 3 min to recover to 88-92% on 3LO2 with pursed lip breathing.      Home Living                      Prior Function            PT Goals (current  goals can now be found in the care plan section) Progress towards PT goals: Progressing toward goals    Frequency  Min 3X/week    PT Plan Current plan remains appropriate    Co-evaluation             End of Session Equipment Utilized During Treatment: Gait belt;Oxygen Activity Tolerance: Patient limited by fatigue Patient left: in chair;with call bell/phone within reach;with chair alarm set;with family/visitor present     Time: [id number] PT Time Calculation (min) (ACUTE ONLY): 34 min  Charges:  $Gait Training: 8-22 mins $Therapeutic Exercise: 8-22 mins                    G Codes:      WhiteArrie Aran F 02-10-2016, 12:19 PM [place] Dasha Kawabata,PT Acute Rehabilitation [phone] (pager)

## 2016-01-15 NOTE — Progress Notes (Signed)
Pt noted to have rising creatinine from 1 to 2 over last 24 hours.  Have stopped diuretic and ACE as well as all other antihypertensives.  Will hydrate today with normal saline since also hyponatremic  Recheck bmet tomorrow. Hypokalemia improved will stop supplemental potassium in event creatinine continues to rise  Strict I and O to follow urine output  Stop Toradol  Ruta Hinds, MD [facility]: [phone] Pager: [phone]

## 2016-01-16 LAB — GLUCOSE, CAPILLARY
GLUCOSE-CAPILLARY: 119 mg/dL — AB (ref 65–99)
GLUCOSE-CAPILLARY: 98 mg/dL (ref 65–99)
Glucose-Capillary: 102 mg/dL — ABNORMAL HIGH (ref 65–99)
Glucose-Capillary: 119 mg/dL — ABNORMAL HIGH (ref 65–99)
Glucose-Capillary: 123 mg/dL — ABNORMAL HIGH (ref 65–99)
Glucose-Capillary: 129 mg/dL — ABNORMAL HIGH (ref 65–99)
Glucose-Capillary: 178 mg/dL — ABNORMAL HIGH (ref 65–99)

## 2016-01-16 LAB — CBC
HEMATOCRIT: 28.5 % — AB (ref 39.0–52.0)
Hemoglobin: 9.2 g/dL — ABNORMAL LOW (ref 13.0–17.0)
MCH: 28.7 pg (ref 26.0–34.0)
MCHC: 32.3 g/dL (ref 30.0–36.0)
MCV: 88.8 fL (ref 78.0–100.0)
Platelets: 186 10*3/uL (ref 150–400)
RBC: 3.21 MIL/uL — ABNORMAL LOW (ref 4.22–5.81)
RDW: 14.9 % (ref 11.5–15.5)
WBC: 9.4 10*3/uL (ref 4.0–10.5)

## 2016-01-16 LAB — BASIC METABOLIC PANEL
ANION GAP: 9 (ref 5–15)
BUN: 34 mg/dL — AB (ref 6–20)
CALCIUM: 8 mg/dL — AB (ref 8.9–10.3)
CO2: 25 mmol/L (ref 22–32)
Chloride: 98 mmol/L — ABNORMAL LOW (ref 101–111)
Creatinine, Ser: 1.61 mg/dL — ABNORMAL HIGH (ref 0.61–1.24)
GFR calc Af Amer: 47 mL/min — ABNORMAL LOW (ref >60)
GFR, EST NON AFRICAN AMERICAN: 40 mL/min — AB (ref >60)
GLUCOSE: 113 mg/dL — AB (ref 65–99)
POTASSIUM: 3.5 mmol/L (ref 3.5–5.1)
SODIUM: 132 mmol/L — AB (ref 135–145)

## 2016-01-16 MED ORDER — NIACIN ER 250 MG PO CPCR
750.0000 mg | ORAL_CAPSULE | Freq: Every day | ORAL | Status: DC
  Administered 2016-01-16: 750 mg via ORAL
  Filled 2016-01-16: qty 1

## 2016-01-16 NOTE — H&P (Signed)
Physical Medicine and Rehabilitation Admission H&P    CC: L-BKA complicated by ileus, PE and R-PTX   HPI:   Raymond Jacobson is a 75 y.o. male with history of CAD, GERD, A fib, cough, OA,  PVD with  claudication of the LLE affecting QOL. He was admitted on 01/01/16 for  left femoral endarterectomy and left femoral to above knee popliteal bypass by Dr. Oneida Alar. Post op developed ischemia due to occlusion of graft and was taken back to OR that afternoon and repeat procedure on 08/07 for thrombectomy.  He continued to have severe progressive ischemia and AKA recommended due to lack of options. On 08/08 patient underwent L-AKA without complications. Post op had confusion as well as abdominal distension due to ileus and large R- tension PTX noted on 08/9. Chest tube placed at bedside and NGT placed for decompression of abdomen per Dr. Hulen Skains. He developed hypoxia with N/V and concerns of aspiration PNA with RML consolidation and was started on IV antibiotics. PCCM consulted and he was treated with IV lasix for mild pulmonary edema and continued on  IV heparin. He had progressive dyspnea requiring NRB and was transferred to ICU on 8/10. CTA chest was positive for saddle PE, Left PE with large clot burden and right heart strain. EKOS held off due to risk of bleeding complication. BLE dopplers negative for DVT.  Vanc/Zosyn discontinued on 08/11 and chest tube removed on 08/13.  Abdominal function improving and NGT discontinued. Diet advanced to regular and patient continues to have loose stools. He developed hypotension with acute renal failure with rise in BUN/Cr-  34/1.61.  He was started on IVF and duretics/ACE discontinued 8/16 with improvement. Dr. Oneida Alar recommends holding meds till SBP> 160. Anxiety levels are improving but he continues to have dyspnea/hypoxia with activity. CIR recommended by MD and rehab team.      Review of Systems  Constitutional: Positive for malaise/fatigue (for 33month PTA DUE TO  BRONCHITIS).  HENT: Positive for hearing loss.   Eyes: Negative for blurred vision and double vision.  Respiratory: Positive for shortness of breath. Negative for cough and sputum production.   Cardiovascular: Negative for chest pain, palpitations and leg swelling.  Gastrointestinal: Positive for abdominal pain, diarrhea ( TWICE A DAY--WATER AND LOT OF GAS) and heartburn. Negative for nausea.  Genitourinary: Negative for dysuria and urgency.  Musculoskeletal: Positive for back pain (DUE TO BED). Negative for joint pain and neck pain.  Skin: Negative for itching and rash.  Neurological: Positive for dizziness (OCASSIONALLY). Negative for headaches.  Psychiatric/Behavioral: The patient has insomnia. The patient is not nervous/anxious.     Past Medical History:  Diagnosis Date  . Angina    last time >yr  . Arthritis    "knees, ankles" (10/15/2015)  . Basal cell carcinoma of scalp    "froze off"  . CAD (coronary artery disease)    [facility] [place] [facility] 6 [facility]  . Complication of anesthesia   . Cough    recent bronchitis- dry cough  . Dry cough   . DVT (deep venous thrombosis) (HCC) ~ 1969   LLE  . Elevated lipids   . GERD (gastroesophageal reflux disease)    occ -tums  . High cholesterol   . History of hiatal hernia    hx  . Hypertension   . Myocardial infarction ([facility] ~ 2006   "blood work"  . [place]   . PONV (postoperative nausea and vomiting)   . Shortness  of breath 04/13/11   "mostly w/exertion"/ now with bronchitis    Past Surgical History:  Procedure Laterality Date  . AMPUTATION Left 01/07/2016   Procedure: AMPUTATION LEFT LEG  ABOVE KNEE;  Surgeon: Elam Dutch, MD;  Location: [place];  Service: Vascular;  Laterality: Left;  . CARPAL TUNNEL RELEASE Right 1990s  . CORONARY ANGIOPLASTY WITH STENT PLACEMENT  ~ 2004; ~ 2006; 04/13/11   "# of stents 1 (i2004);+2 (2006)+ 1 (04/13/11)= 4 total"  . ENDARTERECTOMY FEMORAL Left  01/01/2016   Procedure: LEFT FEMORAL ARTERY  ENDARTERECTOMY ;  Surgeon: Elam Dutch, MD;  Location: [place];  Service: Vascular;  Laterality: Left;  . FEMORAL ARTERY STENT Left 10/15/2015  . FEMORAL-POPLITEAL BYPASS GRAFT Left 01/01/2016   Procedure: Thrombectomy of Left Leg Femoral-Popliteal Bypass Graft;  Surgeon: Elam Dutch, MD;  Location: [place];  Service: Vascular;  Laterality: Left;  . FEMORAL-POPLITEAL BYPASS GRAFT Left 01/01/2016   Procedure: LEFT  FEMORAL-POPLITEAL ARTERY  BYPASS GRAFT ;  Surgeon: Elam Dutch, MD;  Location: [place];  Service: Vascular;  Laterality: Left;  . FEMORAL-POPLITEAL BYPASS GRAFT Left 01/06/2016   Procedure: THROMBECTOMY WITH REVISION oF dISTAL Anastomosis BYPASS GRAFT FEMORAL-POPLITEAL ARTERY.;  Surgeon: Rosetta Posner, MD;  Location: [place];  Service: Vascular;  Laterality: Left;  . FEMORAL-POPLITEAL BYPASS GRAFT Left 01/06/2016   Procedure: REDO FEMORAL to BELOW THE KNEE POPLITEAL ARTERY BYPASS USING 79m PROPATEN GRAFT;  Surgeon: CElam Dutch MD;  [place]  Service: Vascular;  Laterality: Left;  . FINGER AMPUTATION  1967   "partial; right pointer"  . INTRAOPERATIVE ARTERIOGRAM Left 01/01/2016   Procedure: INTRA OPERATIVE ARTERIOGRAM;  Surgeon: CElam Dutch MD;  [place]  Service: Vascular;  Laterality: Left;  . LEFT HEART CATHETERIZATION WITH CORONARY ANGIOGRAM N/A 04/13/2011   Procedure: LEFT HEART CATHETERIZATION WITH CORONARY ANGIOGRAM;  Surgeon: PJosue Hector MD;  [facility];  Service: Cardiovascular;  Laterality: N/A;  . LEFT HEART CATHETERIZATION WITH CORONARY ANGIOGRAM N/A 06/20/2013   Procedure: LEFT HEART CATHETERIZATION WITH CORONARY ANGIOGRAM;  Surgeon: DLeonie Man MD;  [facility];  Service: Cardiovascular;  Laterality: N/A;  . PERCUTANEOUS CORONARY STENT INTERVENTION (PCI-S) Right 04/13/2011   Procedure: PERCUTANEOUS CORONARY STENT INTERVENTION (PCI-S);  Surgeon: CBurnell Blanks MD;   [facility];  Service: Cardiovascular;  Laterality: Right;  . PERIPHERAL VASCULAR CATHETERIZATION N/A 10/15/2015   Procedure: Abdominal Aortogram w/Lower Extremity;  Surgeon: VSerafina Mitchell MD;  [facility];  Service: Cardiovascular;  Laterality: N/A;  . TONSILLECTOMY     "when I was a young one"  . Tricep Surgery  ~ 2007   "right arm; tore all my muscles @ my elbow"  . VEIN HARVEST Left 01/01/2016   Procedure: USING NON-REVERSE LEFT GREATER SAPHENOUS VEIN HARVEST;  Surgeon: CElam Dutch MD;  [facility];  Service: Vascular;  Laterality: Left;    Family History  Problem Relation Age of Onset  . Cancer Father   . Heart attack Paternal Grandfather     Social History:  Widowed. Has a supportive girlfriend and children out of town. Retired mEditor, commissioning  He reports that he quit smoking about 48 years ago. His smoking use included Cigarettes. He has a 12.00 pack-year smoking history. He has never used smokeless tobacco. He reports that he drinks alcohol--on rare occasion. He reports that he does not use drugs.     Allergies  Allergen Reactions  .  No Known Allergies     Medications Prior to Admission  Medication Sig Dispense Refill  . atorvastatin (LIPITOR) 80 MG tablet Take 1 tablet (80 mg total) by mouth every evening. (Patient taking differently: Take 40 mg by mouth every evening. ) 30 tablet 11  . dabigatran (PRADAXA) 150 MG CAPS capsule Take 150 mg by mouth 2 (two) times daily.    . diphenoxylate-atropine (LOMOTIL) 2.5-0.025 MG tablet Take 1 tablet by mouth daily as needed for diarrhea or loose stools.    . isosorbide mononitrate (IMDUR) 30 MG 24 hr tablet Take 30 mg by mouth 2 (two) times daily.    . metoprolol (TOPROL-XL) 200 MG 24 hr tablet Take 100 mg by mouth daily.    . niacin (NIASPAN) 750 MG CR tablet Take 750 mg by mouth at bedtime.      Marland Kitchen NIFEdipine (ADALAT CC) 90 MG 24 hr tablet Take 90 mg by mouth every evening.    Marland Kitchen OVER THE  COUNTER MEDICATION Take 1 capsule by mouth daily. "Super Beta Prostate" supplement    . [DISCONTINUED] chlorthalidone (HYGROTON) 25 MG tablet Take 12.5 mg by mouth daily.    . [DISCONTINUED] enalapril (VASOTEC) 20 MG tablet Take 1 tablet (20 mg total) by mouth 2 (two) times daily. 60 tablet 11  . nitroGLYCERIN (NITROSTAT) 0.4 MG SL tablet Place 1 tablet (0.4 mg total) under the tongue every 5 (five) minutes as needed for chest pain. 25 tablet 12    Home: Home Living Family/patient expects to be discharged to:: Private residence Living Arrangements: Alone Available Help at Discharge: Other (Comment) (pt's "lady friend" local and dtr from [place] visitng ) Type of Home: House Home Access: Stairs to enter [employer] of Steps: 2 Entrance Stairs-Rails: Can reach both Home Layout: One level Bathroom Shower/Tub: Engineer, manufacturing systems: Standard Bathroom Accessibility: No Home Equipment: None Additional Comments: Daughter in room during PT evaluation.    Functional History: Prior Function Level of Independence: Independent Comments: pickle ball player, bow marksman, gardining  Functional Status:  Mobility: Bed Mobility Overal bed mobility: Needs Assistance Bed Mobility: Supine to Sit Rolling: Supervision (heavy use of rail, used momentum to sit up.) Sidelying to sit: Mod assist, +2 for safety/equipment, HOB elevated Supine to sit: Supervision (heavy use of rail, used momentum to sit up) Sit to supine: Min assist, +2 for safety/equipment General bed mobility comments: Incr time due to dyspnea; heavy use of rail; pt somewhat impulsive. Transfers Overall transfer level: Needs assistance Equipment used: Rolling walker (2 wheeled) Transfers: Sit to/from Stand Sit to Stand: Min guard, +2 safety/equipment Stand pivot transfers: Min guard, +2 physical assistance, +2 safety/equipment General transfer comment: Pt does not want PT to assist but explained to pt PT must  make sure he is safe.  Pt impulsive needing cues and close guard assist for safety.  Ambulation/Gait Ambulation/Gait assistance: Mod assist, Min assist, +2 safety/equipment Ambulation Distance (Feet): 15 Feet Assistive device: Rolling walker (2 wheeled) Gait Pattern/deviations: Step-to pattern, Antalgic General Gait Details: Pt was able to walk around bed needing assist with walker sequencing.  Took multiple standing rest breaks due to dyspnea but less breaks than yesterday.   Pt moves impulsively and needs cues for safety awareness.   Gait velocity: decreased Gait velocity interpretation: Below normal speed for age/gender Stairs: Yes Stairs assistance: Supervision Stair Management: One rail Right, Step to pattern Number of Stairs: 4    ADL: ADL Overall ADL's : Needs assistance/impaired Eating/Feeding: NPO Grooming: Wash/dry hands, Oral care,  Wash/dry face, Minimal assistance, Bed level Upper Body Bathing: Moderate assistance, Bed level Lower Body Bathing: Total assistance, Bed level Upper Body Dressing : Maximal assistance, Bed level Lower Body Dressing: Total assistance, Sit to/from stand Functional mobility during ADLs: +2 for physical assistance, Moderate assistance  Cognition: Cognition Overall Cognitive Status: Within Functional Limits for tasks assessed Orientation Level: Oriented X4 Cognition Arousal/Alertness: Awake/alert Behavior During Therapy: Anxious, Impulsive Overall Cognitive Status: Within Functional Limits for tasks assessed   Blood pressure (!) 148/68, pulse 91, temperature 98 F (36.7 C), temperature source Oral, resp. rate 18, height 5\' 8"  (1.727 m), weight 88 kg (194 lb), SpO2 98 %. Physical Exam  Nursing note and vitals reviewed. Constitutional: He is oriented to person, place, and time. He appears well-developed and well-nourished.  HENT:  Head: Normocephalic and atraumatic.  Mouth/Throat: Oropharyngeal exudate present.  Eyes: Conjunctivae are  normal. Pupils are equal, round, and reactive to light.  Neck: Normal range of motion. Neck supple.  Cardiovascular: Normal rate and regular rhythm.  Exam reveals no gallop and no friction rub.   No murmur heard. Respiratory: Effort normal and breath sounds normal. No respiratory distress. He has no wheezes. He has no rales.  GI: Normal appearance. He exhibits distension. Bowel sounds are decreased. There is no tenderness.  Musculoskeletal: He exhibits no edema or tenderness.  L-AKA with staples intact--clean, dry without erythema. Incisions clean and dry  Neurological: He is alert and oriented to person, place, and time. He displays normal reflexes. A cranial nerve deficit is present. He exhibits normal muscle tone.  UE motor 5/5. RLE 4-HF 4/5 KE and 5/5 ADF/PF. Can lift leg thigh agst gravity  Skin: Skin is warm and dry.  Left leg dressed/ wound well approximated  Psychiatric: He has a normal mood and affect. His behavior is normal.    Results for orders placed or performed during the hospital encounter of 01/01/16 (from the past 48 hour(s))  Glucose, capillary     Status: Abnormal   Collection Time: 01/15/16 11:26 AM  Result Value Ref Range   Glucose-Capillary 170 (H) 65 - 99 mg/dL   Comment 1 Notify RN    Comment 2 Document in Chart   Glucose, capillary     Status: Abnormal   Collection Time: 01/15/16  4:25 PM  Result Value Ref Range   Glucose-Capillary 160 (H) 65 - 99 mg/dL   Comment 1 Notify RN    Comment 2 Document in Chart   Glucose, capillary     Status: Abnormal   Collection Time: 01/15/16  8:37 PM  Result Value Ref Range   Glucose-Capillary 132 (H) 65 - 99 mg/dL   Comment 1 Notify RN   Glucose, capillary     Status: None   Collection Time: 01/16/16 12:18 AM  Result Value Ref Range   Glucose-Capillary 98 65 - 99 mg/dL   Comment 1 Notify RN   Basic metabolic panel     Status: Abnormal   Collection Time: 01/16/16  3:14 AM  Result Value Ref Range   Sodium 132 (L) 135 -  145 mmol/L   Potassium 3.5 3.5 - 5.1 mmol/L   Chloride 98 (L) 101 - 111 mmol/L   CO2 25 22 - 32 mmol/L   Glucose, Bld 113 (H) 65 - 99 mg/dL   BUN 34 (H) 6 - 20 mg/dL   Creatinine, Ser 01/18/16 (H) 0.61 - 1.24 mg/dL   Calcium 8.0 (L) 8.9 - 10.3 mg/dL   GFR calc non Af 8.65  40 (L) >60 mL/min   GFR calc Af Amer 47 (L) >60 mL/min    Comment: (NOTE) The eGFR has been calculated using the CKD EPI equation. This calculation has not been validated in all clinical situations. eGFR's persistently <60 mL/min signify possible Chronic Kidney Disease.    Anion gap 9 5 - 15  CBC     Status: Abnormal   Collection Time: 01/16/16  3:14 AM  Result Value Ref Range   WBC 9.4 4.0 - 10.5 K/uL   RBC 3.21 (L) 4.22 - 5.81 MIL/uL   Hemoglobin 9.2 (L) 13.0 - 17.0 g/dL   HCT 28.5 (L) 39.0 - 52.0 %   MCV 88.8 78.0 - 100.0 fL   MCH 28.7 26.0 - 34.0 pg   MCHC 32.3 30.0 - 36.0 g/dL   RDW 14.9 11.5 - 15.5 %   Platelets 186 150 - 400 K/uL  Glucose, capillary     Status: Abnormal   Collection Time: 01/16/16  4:44 AM  Result Value Ref Range   Glucose-Capillary 119 (H) 65 - 99 mg/dL   Comment 1 Notify RN   Glucose, capillary     Status: Abnormal   Collection Time: 01/16/16  7:36 AM  Result Value Ref Range   Glucose-Capillary 102 (H) 65 - 99 mg/dL  Glucose, capillary     Status: Abnormal   Collection Time: 01/16/16 11:30 AM  Result Value Ref Range   Glucose-Capillary 129 (H) 65 - 99 mg/dL  Glucose, capillary     Status: Abnormal   Collection Time: 01/16/16  4:22 PM  Result Value Ref Range   Glucose-Capillary 178 (H) 65 - 99 mg/dL  Glucose, capillary     Status: Abnormal   Collection Time: 01/16/16  8:37 PM  Result Value Ref Range   Glucose-Capillary 123 (H) 65 - 99 mg/dL  Glucose, capillary     Status: Abnormal   Collection Time: 01/16/16 11:59 PM  Result Value Ref Range   Glucose-Capillary 119 (H) 65 - 99 mg/dL  Basic metabolic panel     Status: Abnormal   Collection Time: 01/17/16  3:40 AM  Result  Value Ref Range   Sodium 134 (L) 135 - 145 mmol/L   Potassium 2.9 (L) 3.5 - 5.1 mmol/L    Comment: DELTA CHECK NOTED   Chloride 99 (L) 101 - 111 mmol/L   CO2 24 22 - 32 mmol/L   Glucose, Bld 114 (H) 65 - 99 mg/dL   BUN 17 6 - 20 mg/dL   Creatinine, Ser 1.01 0.61 - 1.24 mg/dL   Calcium 8.0 (L) 8.9 - 10.3 mg/dL   GFR calc non Af Amer >60 >60 mL/min   GFR calc Af Amer >60 >60 mL/min    Comment: (NOTE) The eGFR has been calculated using the CKD EPI equation. This calculation has not been validated in all clinical situations. eGFR's persistently <60 mL/min signify possible Chronic Kidney Disease.    Anion gap 11 5 - 15  CBC     Status: Abnormal   Collection Time: 01/17/16  3:40 AM  Result Value Ref Range   WBC 7.8 4.0 - 10.5 K/uL   RBC 3.20 (L) 4.22 - 5.81 MIL/uL   Hemoglobin 9.1 (L) 13.0 - 17.0 g/dL   HCT 28.4 (L) 39.0 - 52.0 %   MCV 88.8 78.0 - 100.0 fL   MCH 28.4 26.0 - 34.0 pg   MCHC 32.0 30.0 - 36.0 g/dL   RDW 14.9 11.5 - 15.5 %   Platelets 209  150 - 400 K/uL  Glucose, capillary     Status: Abnormal   Collection Time: 01/17/16  4:42 AM  Result Value Ref Range   Glucose-Capillary 117 (H) 65 - 99 mg/dL  Glucose, capillary     Status: Abnormal   Collection Time: 01/17/16  8:54 AM  Result Value Ref Range   Glucose-Capillary 187 (H) 65 - 99 mg/dL   No results found.     Medical Problem List and Plan: 1.  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  A Fib/Saddle PE/Anticoagulation: Pharmaceutical: Pradexa 3. Pain Management: Neuropathy resolving and pain controlled with prn medications.  4. Mood: LCSW to follow for evaluation and support.  5. Neuropsych: This patient is capable of making decisions on his own behalf. 6. Skin/Wound Care: Routine pressure relief measures. Monitor wound for healing.  7. Fluids/Electrolytes/Nutrition: Strict I/O. Will check lytes closely -recheck BMET this evening.  8. CAD: Off imdur and BB. Imdur discontinued    9.  Acute PE with acute on chronic SOB: 10. HTN:  Bp medications discontinue due to hypotension--will continue to monitor BP bid for stability and resume slowly as tolerated.  11. Acute renal failure: Resolved with hydration and medication changes--Likely due to hypoperfusion with BP 80-90s for 48 hours. Will monitor with serial checks.  12. Hyponatremia: Improving--continue to monitor for now.  13. Fluid overload: Check weight daily and watch for other signs of overload. 14. Reactive leucocytosis: Resolving. Monitor for signs of infection.  37. Post op ileus: Resolving but need to keep K+ >4.0 to prevent recurrence.   -abdomen still slightly distended  -pt reports that his appetite is returning  16.  Hypokalemia: continues to be an issues --Encourage adequate intake to help manage electrolyte abnormality. Will increase supplement  17. ABLA: Monitor H/H and for any signs of bleeding with pradexa on board. Add iron supplement.  18. Prediabetes: On Carb modified diet--will monitor BS ac/hs and use SSI for elevated BS.    Post Admission Physician Evaluation: 1. Functional deficits secondary  to left AKA. 2. Patient is admitted to receive collaborative, interdisciplinary care between the physiatrist, rehab nursing staff, and therapy team. 3. Patient's level of medical complexity and substantial therapy needs in context of that medical necessity cannot be provided at a lesser intensity of care such as a SNF. 4. Patient has experienced substantial functional loss from his/her baseline which was documented above under the "Functional History" and "Functional Status" headings.  Judging by the patient's diagnosis, physical exam, and functional history, the patient has potential for functional progress which will result in measurable gains while on inpatient rehab.  These gains will be of substantial and practical use upon discharge  in facilitating mobility and self-care at the household  level. 5. Physiatrist will provide 24 hour management of medical needs as well as oversight of the therapy plan/treatment and provide guidance as appropriate regarding the interaction of the two. 6. 24 hour rehab nursing will assist with bladder management, bowel management, safety, skin/wound care, disease management, medication administration, pain management and patient education  and help integrate therapy concepts, techniques,education, etc. 7. PT will assess and treat for/with: Lower extremity strength, range of motion, stamina, balance, functional mobility, safety, adaptive techniques and equipment, NMR, pre-prosthetic education, activity tolerance, wound care, pain mgt.   Goals are: mod I. 8. OT will assess and treat for/with: ADL's, functional mobility, safety, upper extremity strength, adaptive techniques and equipment, NMR, pain mgt, wound care, family education .  Goals are: mod I. Therapy may proceed with showering this patient if leg is covered. 9. SLP will assess and treat for/with: n/a.  Goals are: n/a. 10. Case Management and Social Worker will assess and treat for psychological issues and discharge planning. 11. Team conference will be held weekly to assess progress toward goals and to determine barriers to discharge. 12. Patient will receive at least 3 hours of therapy per day at least 5 days per week. 13. ELOS: 8-12 days       14. Prognosis:  excellent     Meredith Staggers, MD, [facility] 01/17/2016  01/17/2016

## 2016-01-16 NOTE — Progress Notes (Signed)
Physical Therapy Treatment Patient Details Name: Raymond Jacobson MRN: [id number] DOB: 12/21/1940 Today's Date: 01/16/2016    History of Present Illness 75 y.o. male admitted to [facility] on 01/01/16 for L leg claudication symptoms.  He underwent L femoral endarterectomy and left femoral to above knee popliteal bypass on 01/01/16.  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  NGT placed due to post op ileus and incidental finding of R PTX, so R CT placed on 01/08/16 -01/12/16.  Pt with significant PMHx of SOB, PVD, MI, HTN, DVT, and CAD.    PT Comments    Pt admitted with above diagnosis. Pt currently with functional limitations due to balance and endurance deficits. Pt was able to ambulate around bed again with cues for safety as he is somewhat impulsive.  Pt refused to sit in chair as it hurt his buttocks yesterday.  Pt back in bed and reviewed exercise program and gave pt handout. Pt agreed to try different chair tomorrow.   Pt will benefit from skilled PT to increase their independence and safety with mobility to allow discharge to the venue listed below.    Follow Up Recommendations  CIR     Equipment Recommendations  Rolling walker with 5" wheels;3in1 (PT);Wheelchair (measurements PT);Wheelchair cushion (measurements PT);Other (comment)    Recommendations for Other Services       Precautions / Restrictions Precautions Precautions: Fall Restrictions Weight Bearing Restrictions: No    Mobility  Bed Mobility Overal bed mobility: Needs Assistance Bed Mobility: Supine to Sit Rolling: Supervision (heavy use of rail, used momentum to sit up.)   Supine to sit: Supervision (heavy use of rail, used momentum to sit up)     General bed mobility comments: Incr time due to dyspnea; heavy use of rail; pt somewhat impulsive.  Transfers Overall transfer level: Needs assistance Equipment used: Rolling walker (2 wheeled) Transfers: Sit to/from  Stand Sit to Stand: Min guard;+2 safety/equipment         General transfer comment: Pt does not want PT to assist but explained to pt PT must make sure he is safe.  Pt impulsive needing cues and close guard assist for safety.   Ambulation/Gait Ambulation/Gait assistance: Mod assist;Min assist;+2 safety/equipment Ambulation Distance (Feet): 15 Feet Assistive device: Rolling walker (2 wheeled) Gait Pattern/deviations: Step-to pattern;Antalgic   Gait velocity interpretation: Below normal speed for age/gender General Gait Details: Pt was able to walk around bed needing assist with walker sequencing.  Took multiple standing rest breaks due to dyspnea but less breaks than yesterday.   Pt moves impulsively and needs cues for safety awareness.     Stairs            Wheelchair Mobility    Modified Rankin (Stroke Patients Only)       Balance Overall balance assessment: Needs assistance;History of Falls Sitting-balance support: No upper extremity supported;Feet supported Sitting balance-Leahy Scale: Fair Sitting balance - Comments: Able to sit EOB with 1 UE support.    Standing balance support: Bilateral upper extremity supported;During functional activity Standing balance-Leahy Scale: Poor Standing balance comment: Needs RW for support             High level balance activites: Turns;Direction changes High Level Balance Comments: Needed min assist and cues for safety with sequencing steps and RW.     Cognition Arousal/Alertness: Awake/alert Behavior During Therapy: Anxious;Impulsive Overall Cognitive Status: Within Functional Limits for tasks assessed  Exercises Amputee Exercises Quad Sets: AROM;Right;5 reps Gluteal Sets: AROM;Both;10 reps;Supine Towel Squeeze: AROM;Both;10 reps;Supine Hip ABduction/ADduction: AROM;Both;10 reps;Supine Hip Flexion/Marching: AROM;Both;10 reps;Supine Other Exercises Other Exercises: Gave pt handout for  amputee exercises and for sensation after amputation    General Comments General comments (skin integrity, edema, etc.): Tech pushed chair behind pt but he didn't need it today..      Pertinent Vitals/Pain Pain Assessment: Faces Faces Pain Scale: Hurts even more Pain Location: LLE and buttocks Pain Descriptors / Indicators: Constant;Aching Pain Intervention(s): Limited activity within patient's tolerance;Monitored during session;Premedicated before session;Repositioned  VSS    Home Living                      Prior Function            PT Goals (current goals can now be found in the care plan section) Progress towards PT goals: Progressing toward goals    Frequency  Min 3X/week    PT Plan Current plan remains appropriate    Co-evaluation             End of Session Equipment Utilized During Treatment: Gait belt;Oxygen Activity Tolerance: Patient limited by fatigue Patient left: in bed;with call bell/phone within reach;with bed alarm set     Time: [id number] PT Time Calculation (min) (ACUTE ONLY): 24 min  Charges:  $Gait Training: 8-22 mins $Therapeutic Exercise: 8-22 mins                    G Codes:      Serah Nicoletti, Arrie Aran F January 22, 2016, 2:04 PM [employer] [facility] [phone] (pager)

## 2016-01-16 NOTE — Discharge Summary (Signed)
[facility] Discharge Summary  Raymond Jacobson 1940-09-05 75 y.o. male  [id number]  Admission Date: 01/01/2016  Discharge Date:   Physician: Elam Dutch, MD  Admission Diagnosis: Peripheral vascular disease with left lower extremity claudication I70.212 clotted left femoral popliteal bypass Occluded left femoral to popliteal bypass graft T82.392D ischemic left leg LEFT ISCHEMIA LEG  HPI:   This is a 75 y.o. male who had a 4-5 month history of pain that radiates from his left foot up to his left hip after walking about a quarter mile. He underwent left superficial femoral artery stenting by Dr. Trula Slade on 10/15/15. His symptoms have not improved. After resting for several minutes this dissipates. He denies rest pain. No history of nonhealing wounds. He denies history of diabetes. He does occasionally get some angina. He does not get angina at rest. Other medical problems include coronary artery disease, hyperlipidemia, hypertension which are currently stable. He is a former smoker but quit almost 40 years ago. He states he is on Pradaxa for his coronary stents. Patient is very frustrated that he can no longer perform the activities that he enjoys such as artery competitions and pickle ball. He states that he believes his quality of life is diminished secondary to this.  Patient had bilateral ABIs performed at [facility] on 10/01/2015. ABI on the right was 0.97 left was 0.74. His 1 month follow-up ABIs today shows an ABI on the left of 0.67 duplex shows the left stent is occluded.  I reviewed the patient's recent arteriogram today. This shows a short segment left SFA occlusion which was then stented. The patient has essentially one-vessel runoff via the peroneal. The anterior tibial artery is open proximally but occludes in the distal leg. The peroneal artery fills the dorsalis pedis artery.  The patient was offered conservative management with a walking  program and maximal medical management. However, he opted for surgery. A left femoral popliteal bypass was scheduled for 01/01/2016.  The patient was seen by cardiology for preoperative assessment. He was found to be an intermediate but acceptable risk for surgery.  Hospital Course:  The patient was admitted to the hospital and taken to the operating room on 01/01/2016 and underwent left femoral endarterectomy and left femoral-to-popliteal above-knee popliteal bypass with right greater saphenous vein. At the conclusion of the case, the patient had monophasic to biphasic Doppler flow in the dorsalis pedis area of the foot.  The patient tolerated the procedure well and was transported to the PACU in  stable condition.   Later that evening, the patient was noted to have lost Doppler signals in the left foot on arrival to the stepdown unit from the recovery room. He was taken emergently to the OR for left lower extremity thrombectomy. Operative findings: Thrombosed left femoropopliteal bypass suspicious for retained venous valve, intraoperative arteriogram showing widely patent distal anastomosis with runoff via the peroneal and anterior tibial arteries.   The patient was started on a low dose heparin drip in the recovery room. He had triphasic peroneal, biphasic dorsalis pedis and faint monophasic posterior tibial Doppler signals.  POD 1: The patient had a palpable dorsalis pedis pulse. He had brisk biphasic DP signal, monophasic PT and triphasic peroneal signals. His home blood thinner (Pradaxa) was restarted. He was transferred to the floor.   RIGHT   LEFT    PRESSURE WAVEFORM  PRESSURE WAVEFORM  BRACHIAL 112 T BRACHIAL 115 T  DP   DP    AT 105 0.91 AT  32 DM  PT 101 0.88 PT 36 DM  PER   PER 43 DM  GREAT TOE  NA GREAT TOE  NA    RIGHT LEFT  ABI 0.91 0.37    POD 2: A left lower extremity arterial duplex was performed. This identified possible occlusion of the left  femoral-popliteal bypass graft. Although his graft was occluded, his foot was not threatened. His Pradaxa was discontinued and heparin continued. Plans were made to redo bypass on 01/06/2016 with propaten. It was felt that there was likely a bad spot in the vein that caused reocclusion of his bypass graft.  His left foot was stable on postop day 3. The patient began having pain and numbness in his heel on postop day 4.  Taken to the operating room on 01/06/2016 and underwent left femoral to above-knee popliteal bypass with propaten. Operative findings included an occluded vein femoropopliteal bypass and replaced graft with 6 mm Propaten vein cuff proximally and distally. The patient tolerated the procedure well and was transferred to the recovery room in stable condition.  Approximately one hour after arriving to the recovery room, the nursing staff noticed a loss of Doppler signals and mottled appearance to the left foot. The patient had a left palpable popliteal pulse. His motor and sensory function were intact. He did have a peroneal Doppler signal above his ankle. There were no plans for surgical intervention at that time.  Later that evening, the patient had recurrent ischemia of his left foot. His previously palpable popliteal pulse was now absent. His foot was markedly cool compared to his right and he had no sensory function. He also had diminished motor function. The patient stated that he was ready for an amputation. However, Dr. Donnetta Hutching recommended attempted thrombectomy and possible revision to his bypass. He was taken emergently to the operating room that night for thrombectomy and revision of the distal anastomosis of his left femoral to above-knee propaten bypass.  The following day on 01/07/2016, the patient continued to have severe progressive ischemia to his left leg. He had no Doppler flow to his left foot. He also had a mottled appearance of his left foot to mid calf. Given multiple  attempts at revascularization, it was felt that he had no further options. He was put on the schedule for left above-knee amputation that day.  The patient was taken to the operating room on 01/07/2016 and underwent left above-knee amputation. The patient tolerated the procedure well and was taken to the recovery room in stable condition.  Postop day 1 status post left AKA, the patient complained of abdominal pain and swelling of his penis. His abdomen was firm with diffuse tenderness to palpation. He had positive bowel sounds. His last bowel movement was 1-2 days prior. His oxygen saturation rates were 90% on 3 L of oxygen via nasal cannula. His left AKA bandage was clean and dry. No abdominal x-ray was ordered. Patient was incindentally noted to have a large right pneumothorax on abdominal x-ray. There was no tracheal deviation or hemodynamic instability. He then required a non-rebreather. Dr. Hulen Skains from trauma was consult head and placed a chest tube at the bedside. He also had an NG tube placed or ileus. Given some confusion overnight, his Dilaudid PCA was discontinued.  Later that evening on 01/08/2016 after chest tube placement, the patient was found to have acute onset of shortness of breath and increased O2 needs. The patient was transferred to 2 heart. Critical care was consult head. His chest  x-ray was concerning for possible aspiration pneumonia given a near vomiting episode earlier. He was treated for HCAP with vanc and Zosyn.  The patient's pneumothorax was resolved on chest x-ray. His chest tube was continued as there was a small air leak. His ileus was clinically resolving but his NG tube was kept in. His incisions were healing well. His respiratory status was improved. His Heparin.  A CT angiogram gram was ordered and was positive for saddle PE and left-sided PE with large clot burden. He was continued on heparin. Critical care discussed catheter assisted thrombolysis with Dr. Oneida Alar. Given  multiple recent surgeries, it was felt that this would pose a serious complication.  A venous duplex was negative for DVT in the bilateral lower extremities.  On 01/10/2016, the patient was started on clear liquids. His chest tube was to water seal. He was feeling better and had a bowel movement and was passing flatus.  His NG tube was discontinued on 01/11/2016. His left AKA wound site was healing well. His chest tube was removed on 01/12/2016. His right pneumothorax was resolved. There was no air leak.  The patient had some bloody drainage from his left mid thigh incision. Otherwise, his left AKA was healing well. The patient was requiring 6 L of O2 that was gradually weaned. His leukocytosis was trending up. His chest x-ray on 01/13/2016 revealed no pneumothorax.  Pradaxa was restarted on 01/14/2016. He was on 4 L of oxygen without dyspnea. His leukocytosis was trending down. There was some bloody drainage to his left medial thigh but nothing was expressible from the left medial thigh. He was transferred out of stepdown.  The patient has some soft blood pressures. His blood pressure medications were held.  By 01/16/2016, his renal dysfunction had improved with hydration. His blood pressure was stable and his antihypertensive meds were continued to be held. He had decreased O2 requirements. His left mid thigh still had occasional minimal serous sanguinous drainage. His left AKA continue to heal without drainage.  01/17/16: The patient was feeling better. His renal dysfunction had resolved with IVF and BP meds held. His blood pressure was stable. His beta blocker was restarted but his diuretic and ACEI were continued to be held.  He was continuing to wean off of oxygen. He was tolerating a regular diet. His left groin and proximal thigh incisions had mild separation but no evidence of infection. His left medial thigh incision drainage had nearly resolved. His left AKA was healing. He continued to  have hypokalemia and this was repleted. His leukocytosis had resolved. He was discharged to CIR in good condition.     CBC    Component Value Date/Time   WBC 7.8 01/17/2016 0340   RBC 3.20 (L) 01/17/2016 0340   HGB 9.1 (L) 01/17/2016 0340   HCT 28.4 (L) 01/17/2016 0340   PLT 209 01/17/2016 0340   MCV 88.8 01/17/2016 0340   MCH 28.4 01/17/2016 0340   MCHC 32.0 01/17/2016 0340   RDW 14.9 01/17/2016 0340   LYMPHSABS 1.2 10/01/2015 0901   MONOABS 0.4 10/01/2015 0901   EOSABS 0.1 10/01/2015 0901   BASOSABS 0.1 10/01/2015 0901    BMET    Component Value Date/Time   NA 134 (L) 01/17/2016 0340   K 2.9 (L) 01/17/2016 0340   CL 99 (L) 01/17/2016 0340   CO2 24 01/17/2016 0340   GLUCOSE 114 (H) 01/17/2016 0340   BUN 17 01/17/2016 0340   CREATININE 1.01 01/17/2016 0340   CALCIUM 8.0 (  L) 01/17/2016 0340   GFRNONAA >60 01/17/2016 0340   GFRAA >60 01/17/2016 0340     Discharge Instructions:   The patient is discharged to CIR with extensive instructions on wound care and progressive ambulation.  They are instructed not to drive or perform any heavy lifting until returning to see the physician in his office.  Discharge Instructions    Call MD for:  redness, tenderness, or signs of infection (pain, swelling, bleeding, redness, odor or green/yellow discharge around incision site)    Complete by:  As directed   Call MD for:  severe or increased pain, loss or decreased feeling  in affected limb(s)    Complete by:  As directed   Call MD for:  temperature >100.5    Complete by:  As directed   Discharge wound care:    Complete by:  As directed   Dry gauze to left groin and proximal thigh incisions bid.   Gauze to left medial thigh wound. Wrap left AKA with kerlix and ACE daily.   Increase activity slowly    Complete by:  As directed   Walk with assistance use walker or cane as needed   Resume previous diet    Complete by:  As directed      Discharge Diagnosis:  Peripheral vascular  disease with left lower extremity claudication I70.212 clotted left femoral popliteal bypass Occluded left femoral to popliteal bypass graft T82.392D ischemic left leg LEFT ISCHEMIA LEG  Secondary Diagnosis: Patient Active Problem List   Diagnosis Date Noted  . Abdominal distension   . 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 Coronary artery disease involving native coronary artery of native heart without angina pectoris   . 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 HLD (hyperlipidemia)   . Post-operative pain   . Acute blood loss anemia   . Right lower lobe pneumonia   . Diastolic dysfunction   . Tachypnea   . Tachycardia   . Hypokalemia   . Leukocytosis   . Thrombocytopenia ([place])   . Surgery, other elective   . Pain of upper abdomen   . Pneumothorax on right   . Chest trauma   . Acute respiratory failure ([place])   . Pre-operative cardiovascular examination 12/31/2015  . PAD (peripheral artery disease) ([place]) 10/15/2015  . ACS (acute coronary syndrome) ([place]) 06/20/2013  . HTN (hypertension) 04/13/2011  . Elevated lipids 04/13/2011  . CAD S/P multiple PCIs 04/13/2011   Past Medical History:  Diagnosis Date  . Angina    last time >yr  . Arthritis    "knees, ankles" (10/15/2015)  . Basal cell carcinoma of scalp    "froze off"  . CAD (coronary artery disease)    Previous stents last 6 years ago [place] CT  . Complication of anesthesia   . Cough    recent bronchitis- dry cough  . Dry cough   . DVT (deep venous thrombosis) (HCC) ~ 1969   LLE  . Elevated lipids   . GERD (gastroesophageal reflux disease)    occ -tums  . High cholesterol   . History of hiatal hernia    hx  . Hypertension   . Myocardial infarction [facility]) ~ 2006   "blood work"  . Peripheral vascular disease ([place])   . PONV (postoperative nausea and vomiting)   . Shortness of breath 04/13/11   "mostly w/exertion"/ now with bronchitis       Medication List    TAKE these medications  atorvastatin 80 MG tablet Commonly known as:  LIPITOR Take 1 tablet (80 mg total) by mouth every evening. What changed:  how much to take   chlorthalidone 25 MG tablet Commonly known as:  HYGROTON Take 12.5 mg by mouth daily.   dabigatran 150 MG Caps capsule Commonly known as:  PRADAXA Take 150 mg by mouth 2 (two) times daily.   diphenoxylate-atropine 2.5-0.025 MG tablet Commonly known as:  LOMOTIL Take 1 tablet by mouth daily as needed for diarrhea or loose stools.   enalapril 20 MG tablet Commonly known as:  VASOTEC Take 1 tablet (20 mg total) by mouth 2 (two) times daily.   isosorbide mononitrate 30 MG 24 hr tablet Commonly known as:  IMDUR Take 30 mg by mouth 2 (two) times daily.   metoprolol 200 MG 24 hr tablet Commonly known as:  TOPROL-XL Take 100 mg by mouth daily.   niacin 750 MG CR tablet Commonly known as:  NIASPAN Take 750 mg by mouth at bedtime.   NIFEdipine 90 MG 24 hr tablet Commonly known as:  ADALAT CC Take 90 mg by mouth every evening.   nitroGLYCERIN 0.4 MG SL tablet Commonly known as:  NITROSTAT Place 1 tablet (0.4 mg total) under the tongue every 5 (five) minutes as needed for chest pain.   OVER THE COUNTER MEDICATION Take 1 capsule by mouth daily. "Super Beta Prostate" supplement       Disposition: CIR  Patient's condition: is Good  Follow up: 1. Dr. Oneida Alar in 2 weeks   Virgina Jock, PA-C [facility] [phone] 01/17/2016  10:08 AM  - For VQI Registry use --- Instructions: Press F2 to tab through selections.  Delete question if not applicable.   Post-op:  Wound infection: No  Graft infection: No  Transfusion: No   New Arrhythmia: No Ipsilateral amputation: Yes, [ ]  Minor, [x ] BKA, [ ]  AKA D/C Ambulatory Status: Ambulatory with Assistance  Complications: MI: No, [ ]  Troponin only, [ ]  EKG or Clinical CHF: No Resp failure:No, [ ]  Pneumonia, [ ]  Ventilator Chg in renal function: Yes, [x ] Inc. Cr >  0.5, [ ]  Temp. Dialysis, [ ]  Permanent dialysis Stroke: No, [ ]  Minor, [ ]  Major Return to OR: Yes  Reason for return to OR: [ ]  Bleeding, [ ]  Infection, [x ] Thrombosis, [x ] Revision  Discharge medications: Statin use:  yes ASA use:  no Plavix use:  no Beta blocker use: yes Coumadin use: no, on Pradaxa   '

## 2016-01-16 NOTE — Progress Notes (Signed)
[facility] [place]  Subjective  - feels a little better   Objective 118/62 (!) 108 97.6 F (36.4 C) (Oral) 20 100%  Intake/Output Summary (Last 24 hours) at 01/16/16 0753 Last data filed at 01/16/16 0737  Gross per 24 hour  Intake          1183.33 ml  Output              825 ml  Net           358.33 ml   3L O2 Left AKA continues to heal no groin drainaged Mid thigh still occasional spot of serosanguinous drainage  Assessment/Planning: Renal dysfunction/ATN improved with hydration continue to hold BP meds diuretic PE decreased O2 requirements AKA hopefully rehab tomorrow  Raymond Jacobson 01/16/2016 7:53 AM --  Laboratory Lab Results:  Recent Labs  01/15/16 0930 01/16/16 0314  WBC 11.0* 9.4  HGB 9.2* 9.2*  HCT 28.8* 28.5*  PLT 169 186   BMET  Recent Labs  01/15/16 0930 01/16/16 0314  NA 130* 132*  K 3.6 3.5  CL 96* 98*  CO2 26 25  GLUCOSE 207* 113*  BUN 41* 34*  CREATININE 2.27* 1.61*  CALCIUM 8.1* 8.0*    COAG Lab Results  Component Value Date   INR 1.00 01/01/2016   INR 1.72 (H) 12/24/2015   INR 1.06 10/01/2015   No results found for: PTT

## 2016-01-17 ENCOUNTER — Inpatient Hospital Stay (HOSPITAL_COMMUNITY): Payer: No Typology Code available for payment source

## 2016-01-17 ENCOUNTER — Inpatient Hospital Stay (HOSPITAL_COMMUNITY)
Admission: RE | Admit: 2016-01-17 | Discharge: 2016-01-25 | DRG: 559 | Disposition: A | Discharge status: Home Health | Payer: No Typology Code available for payment source | Source: Intra-hospital | Attending: Physical Medicine & Rehabilitation | Admitting: Physical Medicine & Rehabilitation

## 2016-01-17 DIAGNOSIS — Z87891 Personal history of nicotine dependence: Secondary | ICD-10-CM

## 2016-01-17 DIAGNOSIS — Z89612 Acquired absence of left leg above knee: Secondary | ICD-10-CM

## 2016-01-17 DIAGNOSIS — Z7902 Long term (current) use of antithrombotics/antiplatelets: Secondary | ICD-10-CM | POA: Diagnosis not present

## 2016-01-17 DIAGNOSIS — G629 Polyneuropathy, unspecified: Secondary | ICD-10-CM

## 2016-01-17 DIAGNOSIS — R140 Abdominal distension (gaseous): Secondary | ICD-10-CM | POA: Diagnosis present

## 2016-01-17 DIAGNOSIS — I252 Old myocardial infarction: Secondary | ICD-10-CM

## 2016-01-17 DIAGNOSIS — I4891 Unspecified atrial fibrillation: Secondary | ICD-10-CM | POA: Diagnosis not present

## 2016-01-17 DIAGNOSIS — I959 Hypotension, unspecified: Secondary | ICD-10-CM | POA: Diagnosis not present

## 2016-01-17 DIAGNOSIS — I519 Heart disease, unspecified: Secondary | ICD-10-CM | POA: Diagnosis not present

## 2016-01-17 DIAGNOSIS — K219 Gastro-esophageal reflux disease without esophagitis: Secondary | ICD-10-CM | POA: Diagnosis present

## 2016-01-17 DIAGNOSIS — D62 Acute posthemorrhagic anemia: Secondary | ICD-10-CM

## 2016-01-17 DIAGNOSIS — Z79899 Other long term (current) drug therapy: Secondary | ICD-10-CM | POA: Diagnosis not present

## 2016-01-17 DIAGNOSIS — I739 Peripheral vascular disease, unspecified: Secondary | ICD-10-CM | POA: Diagnosis not present

## 2016-01-17 DIAGNOSIS — E871 Hypo-osmolality and hyponatremia: Secondary | ICD-10-CM

## 2016-01-17 DIAGNOSIS — K567 Ileus, unspecified: Secondary | ICD-10-CM

## 2016-01-17 DIAGNOSIS — I10 Essential (primary) hypertension: Secondary | ICD-10-CM

## 2016-01-17 DIAGNOSIS — R7303 Prediabetes: Secondary | ICD-10-CM | POA: Diagnosis not present

## 2016-01-17 DIAGNOSIS — E876 Hypokalemia: Secondary | ICD-10-CM

## 2016-01-17 DIAGNOSIS — E8770 Fluid overload, unspecified: Secondary | ICD-10-CM | POA: Diagnosis not present

## 2016-01-17 DIAGNOSIS — Z89021 Acquired absence of right finger(s): Secondary | ICD-10-CM | POA: Diagnosis not present

## 2016-01-17 DIAGNOSIS — Z85828 Personal history of other malignant neoplasm of skin: Secondary | ICD-10-CM | POA: Diagnosis not present

## 2016-01-17 DIAGNOSIS — Z4781 Encounter for orthopedic aftercare following surgical amputation: Principal | ICD-10-CM

## 2016-01-17 DIAGNOSIS — E8809 Other disorders of plasma-protein metabolism, not elsewhere classified: Secondary | ICD-10-CM | POA: Diagnosis not present

## 2016-01-17 DIAGNOSIS — I2510 Atherosclerotic heart disease of native coronary artery without angina pectoris: Secondary | ICD-10-CM

## 2016-01-17 DIAGNOSIS — Z86718 Personal history of other venous thrombosis and embolism: Secondary | ICD-10-CM | POA: Diagnosis not present

## 2016-01-17 DIAGNOSIS — D72828 Other elevated white blood cell count: Secondary | ICD-10-CM

## 2016-01-17 DIAGNOSIS — Z955 Presence of coronary angioplasty implant and graft: Secondary | ICD-10-CM

## 2016-01-17 DIAGNOSIS — I2692 Saddle embolus of pulmonary artery without acute cor pulmonale: Secondary | ICD-10-CM

## 2016-01-17 DIAGNOSIS — R109 Unspecified abdominal pain: Secondary | ICD-10-CM

## 2016-01-17 DIAGNOSIS — T402X5A Adverse effect of other opioids, initial encounter: Secondary | ICD-10-CM

## 2016-01-17 DIAGNOSIS — I5189 Other ill-defined heart diseases: Secondary | ICD-10-CM | POA: Diagnosis present

## 2016-01-17 DIAGNOSIS — Z09 Encounter for follow-up examination after completed treatment for conditions other than malignant neoplasm: Secondary | ICD-10-CM

## 2016-01-17 LAB — CBC
HEMATOCRIT: 28.4 % — AB (ref 39.0–52.0)
HEMOGLOBIN: 9.1 g/dL — AB (ref 13.0–17.0)
MCH: 28.4 pg (ref 26.0–34.0)
MCHC: 32 g/dL (ref 30.0–36.0)
MCV: 88.8 fL (ref 78.0–100.0)
Platelets: 209 10*3/uL (ref 150–400)
RBC: 3.2 MIL/uL — AB (ref 4.22–5.81)
RDW: 14.9 % (ref 11.5–15.5)
WBC: 7.8 10*3/uL (ref 4.0–10.5)

## 2016-01-17 LAB — BASIC METABOLIC PANEL
ANION GAP: 7 (ref 5–15)
Anion gap: 11 (ref 5–15)
BUN: 17 mg/dL (ref 6–20)
BUN: 12 mg/dL (ref 6–20)
CALCIUM: 8 mg/dL — AB (ref 8.9–10.3)
CHLORIDE: 99 mmol/L — AB (ref 101–111)
CHLORIDE: 100 mmol/L — AB (ref 101–111)
CO2: 24 mmol/L (ref 22–32)
CO2: 24 mmol/L (ref 22–32)
CREATININE: 0.93 mg/dL (ref 0.61–1.24)
Calcium: 7.7 mg/dL — ABNORMAL LOW (ref 8.9–10.3)
Creatinine, Ser: 1.01 mg/dL (ref 0.61–1.24)
GFR calc Af Amer: >60 mL/min (ref >60)
GFR calc non Af Amer: >60 mL/min (ref >60)
Glucose, Bld: 114 mg/dL — ABNORMAL HIGH (ref 65–99)
Glucose, Bld: 228 mg/dL — ABNORMAL HIGH (ref 65–99)
POTASSIUM: 2.9 mmol/L — AB (ref 3.5–5.1)
Potassium: 2.9 mmol/L — ABNORMAL LOW (ref 3.5–5.1)
SODIUM: 131 mmol/L — AB (ref 135–145)
Sodium: 134 mmol/L — ABNORMAL LOW (ref 135–145)

## 2016-01-17 LAB — GLUCOSE, CAPILLARY
GLUCOSE-CAPILLARY: 225 mg/dL — AB (ref 65–99)
GLUCOSE-CAPILLARY: 148 mg/dL — AB (ref 65–99)
Glucose-Capillary: 117 mg/dL — ABNORMAL HIGH (ref 65–99)
Glucose-Capillary: 187 mg/dL — ABNORMAL HIGH (ref 65–99)
Glucose-Capillary: 153 mg/dL — ABNORMAL HIGH (ref 65–99)

## 2016-01-17 MED ORDER — DABIGATRAN ETEXILATE MESYLATE 150 MG PO CAPS
150.0000 mg | ORAL_CAPSULE | Freq: Two times a day (BID) | ORAL | Status: DC
  Administered 2016-01-17 – 2016-01-25 (×16): 150 mg via ORAL
  Filled 2016-01-17 (×17): qty 1

## 2016-01-17 MED ORDER — PROCHLORPERAZINE 25 MG RE SUPP: 12.5000 mg | Freq: Four times a day (QID) | RECTAL | Status: DC | PRN

## 2016-01-17 MED ORDER — CHLORTHALIDONE 25 MG PO TABS: 12.5000 mg | ORAL_TABLET | Freq: Every day | ORAL | 11 refills | Status: DC

## 2016-01-17 MED ORDER — GUAIFENESIN-DM 100-10 MG/5ML PO SYRP: 5.0000 mL | ORAL_SOLUTION | Freq: Four times a day (QID) | ORAL | Status: DC | PRN

## 2016-01-17 MED ORDER — PHENOL 1.4 % MT LIQD
1.0000 | OROMUCOSAL | Status: DC | PRN
Start: 2016-01-17 — End: 2016-01-25

## 2016-01-17 MED ORDER — NITROGLYCERIN 0.4 MG SL SUBL: 0.4000 mg | SUBLINGUAL_TABLET | SUBLINGUAL | Status: DC | PRN

## 2016-01-17 MED ORDER — ACETAMINOPHEN 325 MG PO TABS
325.0000 mg | ORAL_TABLET | ORAL | Status: DC | PRN
  Administered 2016-01-24 – 2016-01-25 (×2): 650 mg via ORAL
  Filled 2016-01-17 (×2): qty 2

## 2016-01-17 MED ORDER — ATORVASTATIN CALCIUM 40 MG PO TABS
40.0000 mg | ORAL_TABLET | Freq: Every evening | ORAL | Status: DC
  Administered 2016-01-17 – 2016-01-24 (×8): 40 mg via ORAL
  Filled 2016-01-17 (×8): qty 1

## 2016-01-17 MED ORDER — PANTOPRAZOLE SODIUM 40 MG PO TBEC
40.0000 mg | DELAYED_RELEASE_TABLET | Freq: Every day | ORAL | Status: DC
  Administered 2016-01-18 – 2016-01-25 (×8): 40 mg via ORAL
  Filled 2016-01-17 (×8): qty 1

## 2016-01-17 MED ORDER — ENALAPRIL MALEATE 20 MG PO TABS: 20.0000 mg | ORAL_TABLET | Freq: Two times a day (BID) | ORAL | 11 refills | Status: DC

## 2016-01-17 MED ORDER — PROCHLORPERAZINE EDISYLATE 5 MG/ML IJ SOLN: 5.0000 mg | Freq: Four times a day (QID) | INTRAMUSCULAR | Status: DC | PRN

## 2016-01-17 MED ORDER — TRAZODONE HCL 50 MG PO TABS: 25.0000 mg | ORAL_TABLET | Freq: Every evening | ORAL | Status: DC | PRN

## 2016-01-17 MED ORDER — ENSURE ENLIVE PO LIQD: 237.0000 mL | Freq: Three times a day (TID) | ORAL | Status: DC

## 2016-01-17 MED ORDER — POTASSIUM CHLORIDE CRYS ER 20 MEQ PO TBCR
40.0000 meq | EXTENDED_RELEASE_TABLET | Freq: Three times a day (TID) | ORAL | Status: DC
  Administered 2016-01-17 – 2016-01-19 (×5): 40 meq via ORAL
  Filled 2016-01-17 (×5): qty 2

## 2016-01-17 MED ORDER — BISACODYL 10 MG RE SUPP: 10.0000 mg | Freq: Every day | RECTAL | Status: DC | PRN

## 2016-01-17 MED ORDER — METOPROLOL SUCCINATE ER 50 MG PO TB24
100.0000 mg | ORAL_TABLET | Freq: Every day | ORAL | Status: DC
  Administered 2016-01-18 – 2016-01-25 (×8): 100 mg via ORAL
  Filled 2016-01-17 (×8): qty 2

## 2016-01-17 MED ORDER — METHOCARBAMOL 500 MG PO TABS: 500.0000 mg | ORAL_TABLET | Freq: Four times a day (QID) | ORAL | Status: DC | PRN

## 2016-01-17 MED ORDER — INSULIN ASPART 100 UNIT/ML ~~LOC~~ SOLN
2.0000 [IU] | Freq: Three times a day (TID) | SUBCUTANEOUS | Status: DC
  Administered 2016-01-17: 2 [IU] via SUBCUTANEOUS
  Administered 2016-01-17 – 2016-01-18 (×2): 6 [IU] via SUBCUTANEOUS
  Administered 2016-01-18 – 2016-01-19 (×3): 4 [IU] via SUBCUTANEOUS
  Administered 2016-01-19: 6 [IU] via SUBCUTANEOUS
  Administered 2016-01-19 – 2016-01-21 (×5): 2 [IU] via SUBCUTANEOUS
  Administered 2016-01-22: 4 [IU] via SUBCUTANEOUS
  Administered 2016-01-22 (×2): 2 [IU] via SUBCUTANEOUS
  Administered 2016-01-23: 4 [IU] via SUBCUTANEOUS
  Administered 2016-01-23 – 2016-01-24 (×3): 2 [IU] via SUBCUTANEOUS
  Administered 2016-01-24: 4 [IU] via SUBCUTANEOUS

## 2016-01-17 MED ORDER — POTASSIUM CHLORIDE CRYS ER 20 MEQ PO TBCR
40.0000 meq | EXTENDED_RELEASE_TABLET | Freq: Two times a day (BID) | ORAL | Status: DC
  Administered 2016-01-17: 40 meq via ORAL
  Filled 2016-01-17: qty 2

## 2016-01-17 MED ORDER — FLEET ENEMA 7-19 GM/118ML RE ENEM: 1.0000 | ENEMA | Freq: Once | RECTAL | Status: DC | PRN

## 2016-01-17 MED ORDER — METOPROLOL SUCCINATE ER 100 MG PO TB24
100.0000 mg | ORAL_TABLET | Freq: Every day | ORAL | Status: DC
  Administered 2016-01-17: 100 mg via ORAL
  Filled 2016-01-17: qty 1

## 2016-01-17 MED ORDER — CETYLPYRIDINIUM CHLORIDE 0.05 % MT LIQD
7.0000 mL | Freq: Two times a day (BID) | OROMUCOSAL | Status: DC
  Administered 2016-01-17 – 2016-01-24 (×9): 7 mL via OROMUCOSAL

## 2016-01-17 MED ORDER — GUAIFENESIN-CODEINE 100-10 MG/5ML PO SOLN: 5.0000 mL | Freq: Four times a day (QID) | ORAL | Status: DC | PRN

## 2016-01-17 MED ORDER — DIPHENHYDRAMINE HCL 12.5 MG/5ML PO ELIX: 12.5000 mg | ORAL_SOLUTION | Freq: Four times a day (QID) | ORAL | Status: DC | PRN

## 2016-01-17 MED ORDER — OXYCODONE-ACETAMINOPHEN 5-325 MG PO TABS
1.0000 | ORAL_TABLET | ORAL | Status: DC | PRN
  Administered 2016-01-18 – 2016-01-19 (×2): 2 via ORAL
  Filled 2016-01-17 (×2): qty 2

## 2016-01-17 MED ORDER — NIACIN ER 500 MG PO CPCR
750.0000 mg | ORAL_CAPSULE | Freq: Every day | ORAL | Status: DC
  Administered 2016-01-17 – 2016-01-24 (×8): 750 mg via ORAL
  Filled 2016-01-17 (×8): qty 1

## 2016-01-17 MED ORDER — POLYSACCHARIDE IRON COMPLEX 150 MG PO CAPS
150.0000 mg | ORAL_CAPSULE | Freq: Two times a day (BID) | ORAL | Status: DC
  Administered 2016-01-17 – 2016-01-24 (×15): 150 mg via ORAL
  Filled 2016-01-17 (×15): qty 1

## 2016-01-17 MED ORDER — ALUM & MAG HYDROXIDE-SIMETH 200-200-20 MG/5ML PO SUSP
30.0000 mL | ORAL | Status: DC | PRN
  Administered 2016-01-19 (×3): 30 mL via ORAL
  Filled 2016-01-17 (×3): qty 30

## 2016-01-17 MED ORDER — PROCHLORPERAZINE MALEATE 5 MG PO TABS: 5.0000 mg | ORAL_TABLET | Freq: Four times a day (QID) | ORAL | Status: DC | PRN

## 2016-01-17 NOTE — Progress Notes (Signed)
Gave report to  [facility]mw nurse and transfered pt.

## 2016-01-17 NOTE — Progress Notes (Signed)
PT Cancellation Note  Patient Details Name: Raymond Jacobson MRN: [id number] DOB: Aug 17, 1940   Cancelled Treatment:    Reason Eval/Treat Not Completed: Patient declined, no reason specified.  Pt declined PT and mobility at this time.  Will f/u as appropriate.     Myracle Febres, Thornton Papas 01/17/2016, 9:15 AM

## 2016-01-17 NOTE — Progress Notes (Signed)
Received pt. As a transfer from 2 west.Pt. And his friend were oriented to rehab routine and protocol.Safety plan was explained.Fall prevention plan was explained and signed by pt. And RN.Welcome video was played.

## 2016-01-17 NOTE — Progress Notes (Addendum)
[facility] Progress Note  Subjective   Feels better today. Denies SOB.   Objective Vitals:   01/16/16 2039 01/17/16 0444  BP: (!) 144/68 (!) 148/68  Pulse: 94 91  Resp: 18 18  Temp: 98.2 F (36.8 C) 98 F (36.7 C)    Intake/Output Summary (Last 24 hours) at 01/17/16 0953 Last data filed at 01/17/16 0939  Gross per 24 hour  Intake              360 ml  Output              326 ml  Net               34 ml   Turned 02 down to 2L. Breathing comfortably.  NAD Abdomen soft and non tender.  Left groin and left proximal thigh incision with small area of separation. Left proximal thigh incision with slight maceration. No erythema. No drainage. Left medial thigh wound with mild serosanguinous drainage on dressing. No active drainage seen. Left AKA incision clean. No drainage.   Assessment/Planning: 75 y.o. male is s/p: left AKA following failed bypass, now with resolving ileus and chest tube managed by general surgery for tension pneumothorax and PE previously on pradaxa for afib 10 Days Post-Op   Renal dysfunction/ATN creatinine normal this am. Decrease IVF.  Continue to hold BP meds/diuretics. Only use if SBP >160.  Keep dry gauze to left groin and proximal thigh incisions to wick moisture. Some early wound separation and maceration present. Does not look infected.  Left medial thigh drainage minimal. Left AKA healing.  Continue to replete hypokalemia. No leukocytosis this am. Continue to wean 02. Turned down to 2L this am.  ABLA anemia stable.  [place] for d/c to CIR today.   Alvia Grove 01/17/2016 9:53 AM -- Replete hypokalemia All wounds healing at this point To Rehab today Will need to slowly restart BP meds as BP rises, start with beta blocker today Other meds can be restarted in Rehab or outpt as clinical condition warrants  Ruta Hinds, MD [facility] [place]: [phone] Pager:  [phone]  Laboratory CBC    Component Value Date/Time   WBC 7.8 01/17/2016 0340   HGB 9.1 (L) 01/17/2016 0340   HCT 28.4 (L) 01/17/2016 0340   PLT 209 01/17/2016 0340    BMET    Component Value Date/Time   NA 134 (L) 01/17/2016 0340   K 2.9 (L) 01/17/2016 0340   CL 99 (L) 01/17/2016 0340   CO2 24 01/17/2016 0340   GLUCOSE 114 (H) 01/17/2016 0340   BUN 17 01/17/2016 0340   CREATININE 1.01 01/17/2016 0340   CALCIUM 8.0 (L) 01/17/2016 0340   GFRNONAA >60 01/17/2016 0340   GFRAA >60 01/17/2016 0340    COAG Lab Results  Component Value Date   INR 1.00 01/01/2016   INR 1.72 (H) 12/24/2015   INR 1.06 10/01/2015   No results found for: PTT  Antibiotics Anti-infectives    Start     Dose/Rate Route Frequency Ordered Stop   01/09/16 0930  vancomycin (VANCOCIN) IVPB 1000 mg/200 mL premix  Status:  Discontinued     1,000 mg 200 mL/hr over 60 Minutes Intravenous Every 12 hours 01/08/16 2121 01/10/16 1119   01/08/16 2200  piperacillin-tazobactam (ZOSYN) IVPB 3.375 g  Status:  Discontinued     3.375 g 12.5 mL/hr over 240 Minutes Intravenous Every 8 hours 01/08/16 2121 01/10/16 1119   01/08/16 2130  vancomycin (VANCOCIN) 1,500 mg in sodium chloride 0.9 % 500 mL IVPB     1,500 mg 250 mL/hr over 120 Minutes Intravenous  Once 01/08/16 2121 01/09/16 0030   01/07/16 1800  cefUROXime (ZINACEF) 1.5 g in dextrose 5 % 50 mL IVPB  Status:  Discontinued     1.5 g 100 mL/hr over 30 Minutes Intravenous Every 12 hours 01/07/16 1743 01/08/16 1124   01/07/16 1430  cefUROXime (ZINACEF) 1.5 g in dextrose 5 % 50 mL IVPB     1.5 g 100 mL/hr over 30 Minutes Intravenous  Once 01/07/16 1425 01/07/16 1409   01/07/16 1350  dextrose 5 % with cefUROXime (ZINACEF) ADS Med    Comments:  Kerrie Pleasure   : cabinet override      01/07/16 1350 01/08/16 0159   01/06/16 2151  dextrose 5 % with cefUROXime (ZINACEF) ADS Med    Comments:  Haynes Bast   : cabinet override      01/06/16 2151 01/06/16 2200    01/06/16 1824  dextrose 5 % with cefUROXime (ZINACEF) ADS Med    Comments:  Trixie Deis   : cabinet override      01/06/16 1824 01/07/16 0629   01/06/16 0600  cefUROXime (ZINACEF) 1.5 g in dextrose 5 % 50 mL IVPB     1.5 g 100 mL/hr over 30 Minutes Intravenous On call to O.R. 01/05/16 1035 01/06/16 1430   01/01/16 2200  cefUROXime (ZINACEF) 1.5 g in dextrose 5 % 50 mL IVPB     1.5 g 100 mL/hr over 30 Minutes Intravenous Every 12 hours 01/01/16 2154 01/02/16 1030   01/01/16 1736  dextrose 5 % with cefUROXime (ZINACEF) ADS Med    Comments:  Gershon Crane   : cabinet override      01/01/16 1736 01/02/16 0544   01/01/16 0552  cefUROXime (ZINACEF) 1.5 g in dextrose 5 % 50 mL IVPB     1.5 g 100 mL/hr over 30 Minutes Intravenous 30 min pre-op 01/01/16 0552 01/01/16 0902       Virgina Jock, PA-C [facility] Office: [phone] Pager: [phone] 01/17/2016 9:53 AM

## 2016-01-17 NOTE — H&P (View-Only) (Signed)
Physical Medicine and Rehabilitation Admission H&P    CC: L-BKA complicated by ileus, PE and R-PTX   HPI:   Raymond Jacobson is a 75 y.o. male with history of CAD, GERD, A fib, cough, OA,  PVD with  claudication of the LLE affecting QOL. He was admitted on 01/01/16 for  left femoral endarterectomy and left femoral to above knee popliteal bypass by Dr. Oneida Alar. Post op developed ischemia due to occlusion of graft and was taken back to OR that afternoon and repeat procedure on 08/07 for thrombectomy.  He continued to have severe progressive ischemia and AKA recommended due to lack of options. On 08/08 patient underwent L-AKA without complications. Post op had confusion as well as abdominal distension due to ileus and large R- tension PTX noted on 08/9. Chest tube placed at bedside and NGT placed for decompression of abdomen per Dr. Hulen Skains. He developed hypoxia with N/V and concerns of aspiration PNA with RML consolidation and was started on IV antibiotics. PCCM consulted and he was treated with IV lasix for mild pulmonary edema and continued on  IV heparin. He had progressive dyspnea requiring NRB and was transferred to ICU on 8/10. CTA chest was positive for saddle PE, Left PE with large clot burden and right heart strain. EKOS held off due to risk of bleeding complication. BLE dopplers negative for DVT.  Vanc/Zosyn discontinued on 08/11 and chest tube removed on 08/13.  Abdominal function improving and NGT discontinued. Diet advanced to regular and patient continues to have loose stools. He developed hypotension with acute renal failure with rise in BUN/Cr-  34/1.61.  He was started on IVF and duretics/ACE discontinued 8/16 with improvement. Dr. Oneida Alar recommends holding meds till SBP> 160. Anxiety levels are improving but he continues to have dyspnea/hypoxia with activity. CIR recommended by MD and rehab team.      Review of Systems  Constitutional: Positive for malaise/fatigue (for 33month PTA DUE TO  BRONCHITIS).  HENT: Positive for hearing loss.   Eyes: Negative for blurred vision and double vision.  Respiratory: Positive for shortness of breath. Negative for cough and sputum production.   Cardiovascular: Negative for chest pain, palpitations and leg swelling.  Gastrointestinal: Positive for abdominal pain, diarrhea ( TWICE A DAY--WATER AND LOT OF GAS) and heartburn. Negative for nausea.  Genitourinary: Negative for dysuria and urgency.  Musculoskeletal: Positive for back pain (DUE TO BED). Negative for joint pain and neck pain.  Skin: Negative for itching and rash.  Neurological: Positive for dizziness (OCASSIONALLY). Negative for headaches.  Psychiatric/Behavioral: The patient has insomnia. The patient is not nervous/anxious.     Past Medical History:  Diagnosis Date  . Angina    last time >yr  . Arthritis    "knees, ankles" (10/15/2015)  . Basal cell carcinoma of scalp    "froze off"  . CAD (coronary artery disease)    [facility] [place] [facility] 6 [facility]  . Complication of anesthesia   . Cough    recent bronchitis- dry cough  . Dry cough   . DVT (deep venous thrombosis) (HCC) ~ 1969   LLE  . Elevated lipids   . GERD (gastroesophageal reflux disease)    occ -tums  . High cholesterol   . History of hiatal hernia    hx  . Hypertension   . Myocardial infarction ([facility] ~ 2006   "blood work"  . [place]   . PONV (postoperative nausea and vomiting)   . Shortness  of breath 04/13/11   "mostly w/exertion"/ now with bronchitis    Past Surgical History:  Procedure Laterality Date  . AMPUTATION Left 01/07/2016   Procedure: AMPUTATION LEFT LEG  ABOVE KNEE;  Surgeon: Elam Dutch, MD;  Location: [place];  Service: Vascular;  Laterality: Left;  . CARPAL TUNNEL RELEASE Right 1990s  . CORONARY ANGIOPLASTY WITH STENT PLACEMENT  ~ 2004; ~ 2006; 04/13/11   "# of stents 1 (i2004);+2 (2006)+ 1 (04/13/11)= 4 total"  . ENDARTERECTOMY FEMORAL Left  01/01/2016   Procedure: LEFT FEMORAL ARTERY  ENDARTERECTOMY ;  Surgeon: Elam Dutch, MD;  Location: [place];  Service: Vascular;  Laterality: Left;  . FEMORAL ARTERY STENT Left 10/15/2015  . FEMORAL-POPLITEAL BYPASS GRAFT Left 01/01/2016   Procedure: Thrombectomy of Left Leg Femoral-Popliteal Bypass Graft;  Surgeon: Elam Dutch, MD;  Location: [place];  Service: Vascular;  Laterality: Left;  . FEMORAL-POPLITEAL BYPASS GRAFT Left 01/01/2016   Procedure: LEFT  FEMORAL-POPLITEAL ARTERY  BYPASS GRAFT ;  Surgeon: Elam Dutch, MD;  Location: [place];  Service: Vascular;  Laterality: Left;  . FEMORAL-POPLITEAL BYPASS GRAFT Left 01/06/2016   Procedure: THROMBECTOMY WITH REVISION oF dISTAL Anastomosis BYPASS GRAFT FEMORAL-POPLITEAL ARTERY.;  Surgeon: Rosetta Posner, MD;  Location: [place];  Service: Vascular;  Laterality: Left;  . FEMORAL-POPLITEAL BYPASS GRAFT Left 01/06/2016   Procedure: REDO FEMORAL to BELOW THE KNEE POPLITEAL ARTERY BYPASS USING 79m PROPATEN GRAFT;  Surgeon: CElam Dutch MD;  [place]  Service: Vascular;  Laterality: Left;  . FINGER AMPUTATION  1967   "partial; right pointer"  . INTRAOPERATIVE ARTERIOGRAM Left 01/01/2016   Procedure: INTRA OPERATIVE ARTERIOGRAM;  Surgeon: CElam Dutch MD;  [place]  Service: Vascular;  Laterality: Left;  . LEFT HEART CATHETERIZATION WITH CORONARY ANGIOGRAM N/A 04/13/2011   Procedure: LEFT HEART CATHETERIZATION WITH CORONARY ANGIOGRAM;  Surgeon: PJosue Hector MD;  [facility];  Service: Cardiovascular;  Laterality: N/A;  . LEFT HEART CATHETERIZATION WITH CORONARY ANGIOGRAM N/A 06/20/2013   Procedure: LEFT HEART CATHETERIZATION WITH CORONARY ANGIOGRAM;  Surgeon: DLeonie Man MD;  [facility];  Service: Cardiovascular;  Laterality: N/A;  . PERCUTANEOUS CORONARY STENT INTERVENTION (PCI-S) Right 04/13/2011   Procedure: PERCUTANEOUS CORONARY STENT INTERVENTION (PCI-S);  Surgeon: CBurnell Blanks MD;   [facility];  Service: Cardiovascular;  Laterality: Right;  . PERIPHERAL VASCULAR CATHETERIZATION N/A 10/15/2015   Procedure: Abdominal Aortogram w/Lower Extremity;  Surgeon: VSerafina Mitchell MD;  [facility];  Service: Cardiovascular;  Laterality: N/A;  . TONSILLECTOMY     "when I was a young one"  . Tricep Surgery  ~ 2007   "right arm; tore all my muscles @ my elbow"  . VEIN HARVEST Left 01/01/2016   Procedure: USING NON-REVERSE LEFT GREATER SAPHENOUS VEIN HARVEST;  Surgeon: CElam Dutch MD;  [facility];  Service: Vascular;  Laterality: Left;    Family History  Problem Relation Age of Onset  . Cancer Father   . Heart attack Paternal Grandfather     Social History:  Widowed. Has a supportive girlfriend and children out of town. Retired mEditor, commissioning  He reports that he quit smoking about 48 years ago. His smoking use included Cigarettes. He has a 12.00 pack-year smoking history. He has never used smokeless tobacco. He reports that he drinks alcohol--on rare occasion. He reports that he does not use drugs.     Allergies  Allergen Reactions  .  No Known Allergies     Medications Prior to Admission  Medication Sig Dispense Refill  . atorvastatin (LIPITOR) 80 MG tablet Take 1 tablet (80 mg total) by mouth every evening. (Patient taking differently: Take 40 mg by mouth every evening. ) 30 tablet 11  . dabigatran (PRADAXA) 150 MG CAPS capsule Take 150 mg by mouth 2 (two) times daily.    . diphenoxylate-atropine (LOMOTIL) 2.5-0.025 MG tablet Take 1 tablet by mouth daily as needed for diarrhea or loose stools.    . isosorbide mononitrate (IMDUR) 30 MG 24 hr tablet Take 30 mg by mouth 2 (two) times daily.    . metoprolol (TOPROL-XL) 200 MG 24 hr tablet Take 100 mg by mouth daily.    . niacin (NIASPAN) 750 MG CR tablet Take 750 mg by mouth at bedtime.      Marland Kitchen NIFEdipine (ADALAT CC) 90 MG 24 hr tablet Take 90 mg by mouth every evening.    Marland Kitchen OVER THE  COUNTER MEDICATION Take 1 capsule by mouth daily. "Super Beta Prostate" supplement    . [DISCONTINUED] chlorthalidone (HYGROTON) 25 MG tablet Take 12.5 mg by mouth daily.    . [DISCONTINUED] enalapril (VASOTEC) 20 MG tablet Take 1 tablet (20 mg total) by mouth 2 (two) times daily. 60 tablet 11  . nitroGLYCERIN (NITROSTAT) 0.4 MG SL tablet Place 1 tablet (0.4 mg total) under the tongue every 5 (five) minutes as needed for chest pain. 25 tablet 12    Home: Home Living Family/patient expects to be discharged to:: Private residence Living Arrangements: Alone Available Help at Discharge: Other (Comment) (pt's "lady friend" local and dtr from [place] visitng ) Type of Home: House Home Access: Stairs to enter [employer] of Steps: 2 Entrance Stairs-Rails: Can reach both Home Layout: One level Bathroom Shower/Tub: Engineer, manufacturing systems: Standard Bathroom Accessibility: No Home Equipment: None Additional Comments: Daughter in room during PT evaluation.    Functional History: Prior Function Level of Independence: Independent Comments: pickle ball player, bow marksman, gardining  Functional Status:  Mobility: Bed Mobility Overal bed mobility: Needs Assistance Bed Mobility: Supine to Sit Rolling: Supervision (heavy use of rail, used momentum to sit up.) Sidelying to sit: Mod assist, +2 for safety/equipment, HOB elevated Supine to sit: Supervision (heavy use of rail, used momentum to sit up) Sit to supine: Min assist, +2 for safety/equipment General bed mobility comments: Incr time due to dyspnea; heavy use of rail; pt somewhat impulsive. Transfers Overall transfer level: Needs assistance Equipment used: Rolling walker (2 wheeled) Transfers: Sit to/from Stand Sit to Stand: Min guard, +2 safety/equipment Stand pivot transfers: Min guard, +2 physical assistance, +2 safety/equipment General transfer comment: Pt does not want PT to assist but explained to pt PT must  make sure he is safe.  Pt impulsive needing cues and close guard assist for safety.  Ambulation/Gait Ambulation/Gait assistance: Mod assist, Min assist, +2 safety/equipment Ambulation Distance (Feet): 15 Feet Assistive device: Rolling walker (2 wheeled) Gait Pattern/deviations: Step-to pattern, Antalgic General Gait Details: Pt was able to walk around bed needing assist with walker sequencing.  Took multiple standing rest breaks due to dyspnea but less breaks than yesterday.   Pt moves impulsively and needs cues for safety awareness.   Gait velocity: decreased Gait velocity interpretation: Below normal speed for age/gender Stairs: Yes Stairs assistance: Supervision Stair Management: One rail Right, Step to pattern Number of Stairs: 4    ADL: ADL Overall ADL's : Needs assistance/impaired Eating/Feeding: NPO Grooming: Wash/dry hands, Oral care,  Wash/dry face, Minimal assistance, Bed level Upper Body Bathing: Moderate assistance, Bed level Lower Body Bathing: Total assistance, Bed level Upper Body Dressing : Maximal assistance, Bed level Lower Body Dressing: Total assistance, Sit to/from stand Functional mobility during ADLs: +2 for physical assistance, Moderate assistance  Cognition: Cognition Overall Cognitive Status: Within Functional Limits for tasks assessed Orientation Level: Oriented X4 Cognition Arousal/Alertness: Awake/alert Behavior During Therapy: Anxious, Impulsive Overall Cognitive Status: Within Functional Limits for tasks assessed   Blood pressure (!) 148/68, pulse 91, temperature 98 F (36.7 C), temperature source Oral, resp. rate 18, height '5\' 8"'$  (1.727 m), weight 88 kg (194 lb), SpO2 98 %. Physical Exam  Nursing note and vitals reviewed. Constitutional: He is oriented to person, place, and time. He appears well-developed and well-nourished.  HENT:  Head: Normocephalic and atraumatic.  Mouth/Throat: Oropharyngeal exudate present.  Eyes: Conjunctivae are  normal. Pupils are equal, round, and reactive to light.  Neck: Normal range of motion. Neck supple.  Cardiovascular: Normal rate and regular rhythm.  Exam reveals no gallop and no friction rub.   No murmur heard. Respiratory: Effort normal and breath sounds normal. No respiratory distress. He has no wheezes. He has no rales.  GI: Normal appearance. He exhibits distension. Bowel sounds are decreased. There is no tenderness.  Musculoskeletal: He exhibits no edema or tenderness.  L-AKA with staples intact--clean, dry without erythema. Incisions clean and dry  Neurological: He is alert and oriented to person, place, and time. He displays normal reflexes. A cranial nerve deficit is present. He exhibits normal muscle tone.  UE motor 5/5. RLE 4-HF 4/5 KE and 5/5 ADF/PF. Can lift leg thigh agst gravity  Skin: Skin is warm and dry.  Left leg dressed/ wound well approximated  Psychiatric: He has a normal mood and affect. His behavior is normal.    Results for orders placed or performed during the hospital encounter of 01/01/16 (from the past 48 hour(s))  Glucose, capillary     Status: Abnormal   Collection Time: 01/15/16 11:26 AM  Result Value Ref Range   Glucose-Capillary 170 (H) 65 - 99 mg/dL   Comment 1 Notify RN    Comment 2 Document in Chart   Glucose, capillary     Status: Abnormal   Collection Time: 01/15/16  4:25 PM  Result Value Ref Range   Glucose-Capillary 160 (H) 65 - 99 mg/dL   Comment 1 Notify RN    Comment 2 Document in Chart   Glucose, capillary     Status: Abnormal   Collection Time: 01/15/16  8:37 PM  Result Value Ref Range   Glucose-Capillary 132 (H) 65 - 99 mg/dL   Comment 1 Notify RN   Glucose, capillary     Status: None   Collection Time: 01/16/16 12:18 AM  Result Value Ref Range   Glucose-Capillary 98 65 - 99 mg/dL   Comment 1 Notify RN   Basic metabolic panel     Status: Abnormal   Collection Time: 01/16/16  3:14 AM  Result Value Ref Range   Sodium 132 (L) 135 -  145 mmol/L   Potassium 3.5 3.5 - 5.1 mmol/L   Chloride 98 (L) 101 - 111 mmol/L   CO2 25 22 - 32 mmol/L   Glucose, Bld 113 (H) 65 - 99 mg/dL   BUN 34 (H) 6 - 20 mg/dL   Creatinine, Ser 1.61 (H) 0.61 - 1.24 mg/dL   Calcium 8.0 (L) 8.9 - 10.3 mg/dL   GFR calc non Af Wyvonnia Lora  40 (L) >60 mL/min   GFR calc Af Amer 47 (L) >60 mL/min    Comment: (NOTE) The eGFR has been calculated using the CKD EPI equation. This calculation has not been validated in all clinical situations. eGFR's persistently <60 mL/min signify possible Chronic Kidney Disease.    Anion gap 9 5 - 15  CBC     Status: Abnormal   Collection Time: 01/16/16  3:14 AM  Result Value Ref Range   WBC 9.4 4.0 - 10.5 K/uL   RBC 3.21 (L) 4.22 - 5.81 MIL/uL   Hemoglobin 9.2 (L) 13.0 - 17.0 g/dL   HCT 28.5 (L) 39.0 - 52.0 %   MCV 88.8 78.0 - 100.0 fL   MCH 28.7 26.0 - 34.0 pg   MCHC 32.3 30.0 - 36.0 g/dL   RDW 14.9 11.5 - 15.5 %   Platelets 186 150 - 400 K/uL  Glucose, capillary     Status: Abnormal   Collection Time: 01/16/16  4:44 AM  Result Value Ref Range   Glucose-Capillary 119 (H) 65 - 99 mg/dL   Comment 1 Notify RN   Glucose, capillary     Status: Abnormal   Collection Time: 01/16/16  7:36 AM  Result Value Ref Range   Glucose-Capillary 102 (H) 65 - 99 mg/dL  Glucose, capillary     Status: Abnormal   Collection Time: 01/16/16 11:30 AM  Result Value Ref Range   Glucose-Capillary 129 (H) 65 - 99 mg/dL  Glucose, capillary     Status: Abnormal   Collection Time: 01/16/16  4:22 PM  Result Value Ref Range   Glucose-Capillary 178 (H) 65 - 99 mg/dL  Glucose, capillary     Status: Abnormal   Collection Time: 01/16/16  8:37 PM  Result Value Ref Range   Glucose-Capillary 123 (H) 65 - 99 mg/dL  Glucose, capillary     Status: Abnormal   Collection Time: 01/16/16 11:59 PM  Result Value Ref Range   Glucose-Capillary 119 (H) 65 - 99 mg/dL  Basic metabolic panel     Status: Abnormal   Collection Time: 01/17/16  3:40 AM  Result  Value Ref Range   Sodium 134 (L) 135 - 145 mmol/L   Potassium 2.9 (L) 3.5 - 5.1 mmol/L    Comment: DELTA CHECK NOTED   Chloride 99 (L) 101 - 111 mmol/L   CO2 24 22 - 32 mmol/L   Glucose, Bld 114 (H) 65 - 99 mg/dL   BUN 17 6 - 20 mg/dL   Creatinine, Ser 1.01 0.61 - 1.24 mg/dL   Calcium 8.0 (L) 8.9 - 10.3 mg/dL   GFR calc non Af Amer >60 >60 mL/min   GFR calc Af Amer >60 >60 mL/min    Comment: (NOTE) The eGFR has been calculated using the CKD EPI equation. This calculation has not been validated in all clinical situations. eGFR's persistently <60 mL/min signify possible Chronic Kidney Disease.    Anion gap 11 5 - 15  CBC     Status: Abnormal   Collection Time: 01/17/16  3:40 AM  Result Value Ref Range   WBC 7.8 4.0 - 10.5 K/uL   RBC 3.20 (L) 4.22 - 5.81 MIL/uL   Hemoglobin 9.1 (L) 13.0 - 17.0 g/dL   HCT 28.4 (L) 39.0 - 52.0 %   MCV 88.8 78.0 - 100.0 fL   MCH 28.4 26.0 - 34.0 pg   MCHC 32.0 30.0 - 36.0 g/dL   RDW 14.9 11.5 - 15.5 %   Platelets 209  150 - 400 K/uL  Glucose, capillary     Status: Abnormal   Collection Time: 01/17/16  4:42 AM  Result Value Ref Range   Glucose-Capillary 117 (H) 65 - 99 mg/dL  Glucose, capillary     Status: Abnormal   Collection Time: 01/17/16  8:54 AM  Result Value Ref Range   Glucose-Capillary 187 (H) 65 - 99 mg/dL   No results found.     Medical Problem List and Plan: 1.  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  A Fib/Saddle PE/Anticoagulation: Pharmaceutical: Pradexa 3. Pain Management: Neuropathy resolving and pain controlled with prn medications.  4. Mood: LCSW to follow for evaluation and support.  5. Neuropsych: This patient is capable of making decisions on his own behalf. 6. Skin/Wound Care: Routine pressure relief measures. Monitor wound for healing.  7. Fluids/Electrolytes/Nutrition: Strict I/O. Will check lytes closely -recheck BMET this evening.  8. CAD: Off imdur and BB. Imdur discontinued    9.  Acute PE with acute on chronic SOB: 10. HTN:  Bp medications discontinue due to hypotension--will continue to monitor BP bid for stability and resume slowly as tolerated.  11. Acute renal failure: Resolved with hydration and medication changes--Likely due to hypoperfusion with BP 80-90s for 48 hours. Will monitor with serial checks.  12. Hyponatremia: Improving--continue to monitor for now.  13. Fluid overload: Check weight daily and watch for other signs of overload. 14. Reactive leucocytosis: Resolving. Monitor for signs of infection.  37. Post op ileus: Resolving but need to keep K+ >4.0 to prevent recurrence.   -abdomen still slightly distended  -pt reports that his appetite is returning  16.  Hypokalemia: continues to be an issues --Encourage adequate intake to help manage electrolyte abnormality. Will increase supplement  17. ABLA: Monitor H/H and for any signs of bleeding with pradexa on board. Add iron supplement.  18. Prediabetes: On Carb modified diet--will monitor BS ac/hs and use SSI for elevated BS.    Post Admission Physician Evaluation: 1. Functional deficits secondary  to left AKA. 2. Patient is admitted to receive collaborative, interdisciplinary care between the physiatrist, rehab nursing staff, and therapy team. 3. Patient's level of medical complexity and substantial therapy needs in context of that medical necessity cannot be provided at a lesser intensity of care such as a SNF. 4. Patient has experienced substantial functional loss from his/her baseline which was documented above under the "Functional History" and "Functional Status" headings.  Judging by the patient's diagnosis, physical exam, and functional history, the patient has potential for functional progress which will result in measurable gains while on inpatient rehab.  These gains will be of substantial and practical use upon discharge  in facilitating mobility and self-care at the household  level. 5. Physiatrist will provide 24 hour management of medical needs as well as oversight of the therapy plan/treatment and provide guidance as appropriate regarding the interaction of the two. 6. 24 hour rehab nursing will assist with bladder management, bowel management, safety, skin/wound care, disease management, medication administration, pain management and patient education  and help integrate therapy concepts, techniques,education, etc. 7. PT will assess and treat for/with: Lower extremity strength, range of motion, stamina, balance, functional mobility, safety, adaptive techniques and equipment, NMR, pre-prosthetic education, activity tolerance, wound care, pain mgt.   Goals are: mod I. 8. OT will assess and treat for/with: ADL's, functional mobility, safety, upper extremity strength, adaptive techniques and equipment, NMR, pain mgt, wound care, family education .  Goals are: mod I. Therapy may proceed with showering this patient if leg is covered. 9. SLP will assess and treat for/with: n/a.  Goals are: n/a. 10. Case Management and Social Worker will assess and treat for psychological issues and discharge planning. 11. Team conference will be held weekly to assess progress toward goals and to determine barriers to discharge. 12. Patient will receive at least 3 hours of therapy per day at least 5 days per week. 13. ELOS: 8-12 days       14. Prognosis:  excellent     Meredith Staggers, MD, [facility] 01/17/2016  01/17/2016

## 2016-01-17 NOTE — Progress Notes (Signed)
Retta Diones, RN Rehab Admission Coordinator Signed Physical Medicine and Rehabilitation  PMR Pre-admission Date of Service: 01/14/2016 9:21 PM  Related encounter: Admission (Discharged) from 01/01/2016 in [facility]       [] Hide copied text PMR Admission Coordinator Pre-Admission Assessment  Patient: Raymond Jacobson is an 75 y.o., male MRN: [id number] DOB: 1940/08/12 Height: 5\' 8"  (172.7 cm) Weight: 88 kg (194 lb)                                                                                                                                                                                                                                                                          Insurance Information HMO:     PPO:      PCP:      IPA:      80/20: yes     OTHER: no HMO PRIMARY: Medicare A only      Policy#: [id number]      Subscriber: pt Benefits:  Phone #: Passport one online     Name: 01/15/16 Eff. Date: 01/30/05     Deduct: $1316      Out of Pocket Max: none      Life Max: none CIR: 100%      SNF: 20 full days Outpatient: 80%     Co-Pay: 20% Home Health: 100%      Co-Pay: none DME: 80%     Co-Pay: 20% Providers: pt choice  SECONDARY: [facility] choice      Policy#: [id number]      Subscriber: pt  Medicaid Application Date:       Case Manager:  Disability Application Date:       Case Worker:   Emergency Contact Information        Contact Information    Name Relation Home Work Mobile   [place] Daughter [phone]     Lynelle Smoke [phone]  [phone]   [place], Montanez [phone]       Current Medical History  Patient Admitting Diagnosis: Left AKA  History of Present Illness:A 75 y.o.malewith history of CAD, GERD, A fib, cough, OA, PVD with claudication of the LLE affecting QOL. He was admitted on 01/01/16 for left femoral endarterectomy  and left femoral to above knee popliteal bypass by Dr. Oneida Alar. Post op  developed ischemia due to occlusion of graft and was taken back to OR that afternoon and repeat procedure on 08/07 for thrombectomy. He continued to have severe progressive ischemia and AKA recommended due to lack of options. On 08/08 patient underwent L-AKA without complications. Post op had confusion as well as abdominal distension due to ileus and large R- tension PTX noted on 08/9. Chest tube placed at bedside and NGT placed for decompression of abdomen per Dr. Hulen Skains. He developed hypoxia with N/V and concerns of aspiration PNA with RML consolidation and was started on IV antibiotics. PCCM consulted and he was treated with IV lasix for mild pulmonary edema and continued on IV heparin. He had progressive dyspnea requiring NRB and was transferred to ICU on 8/10. CTA chest was positive for saddle PE, Left PE with large clot burden and right heart strain. EKOS held off due to risk of bleeding complication. BLE dopplers negative for DVT.  Vanc/Zosyn discontinued on 08/11 and chest tube removed on 08/13. Abdominal function improving and NGT discontinued. Diet advanced to regular and patient continues to have loose stools. He developed hypotension with acute renal failure with rise in BUN/Cr- 34/1.61. He was started on IVF and duretics/ACE discontinued 8/16 with improvement. Dr. Oneida Alar recommends holding meds till SBP> 160. Anxiety levels are improving but he continues to have dyspnea/hypoxia with activity. CIR recommended by MD and rehab team.   Past Medical History      Past Medical History:  Diagnosis Date  . Angina    last time >yr  . Arthritis    "knees, ankles" (10/15/2015)  . Basal cell carcinoma of scalp    "froze off"  . CAD (coronary artery disease)    Previous stents last 6 years ago [place] [facility]  . Complication of anesthesia   . Cough    recent bronchitis- dry cough  . Dry cough   . DVT (deep venous thrombosis) (HCC) ~ 1969   LLE  . Elevated lipids   . GERD  (gastroesophageal reflux disease)    occ -tums  . High cholesterol   . History of hiatal hernia    hx  . Hypertension   . Myocardial infarction [facility]) ~ 2006   "blood work"  . Peripheral vascular disease ([place])   . PONV (postoperative nausea and vomiting)   . Shortness of breath 04/13/11   "mostly w/exertion"/ now with bronchitis    Family History  family history includes Cancer in his father; Heart attack in his paternal grandfather.  Prior Rehab/Hospitalizations:  Has the patient had major surgery during 100 days prior to admission? Yes  Current Medications   Current Facility-Administered Medications:  .  0.9 %  sodium chloride infusion, , Intravenous, Continuous, Alvia Grove, PA-C, Last Rate: 50 mL/hr at 01/17/16 1004 .  acetaminophen (TYLENOL) tablet 325-650 mg, 325-650 mg, Oral, Q4H PRN, 650 mg at 01/16/16 0406 **OR** acetaminophen (TYLENOL) suppository 325-650 mg, 325-650 mg, Rectal, Q4H PRN, Samantha J Rhyne, PA-C .  antiseptic oral rinse (CPC / CETYLPYRIDINIUM CHLORIDE 0.05%) solution 7 mL, 7 mL, Mouth Rinse, q12n4p, Elam Dutch, MD, 7 mL at 01/17/16 1200 .  atorvastatin (LIPITOR) tablet 40 mg, 40 mg, Oral, QPM, Elam Dutch, MD, 40 mg at 01/16/16 1714 .  bisacodyl (DULCOLAX) suppository 10 mg, 10 mg, Rectal, Daily PRN, Samantha J Rhyne, PA-C .  chlorhexidine (PERIDEX) 0.12 % solution 15 mL, 15 mL, Mouth Rinse, BID, Juanda Crumble  Antony Blackbird, MD, 15 mL at 01/17/16 1013 .  dabigatran (PRADAXA) capsule 150 mg, 150 mg, Oral, BID, Elam Dutch, MD, 150 mg at 01/17/16 1013 .  diphenoxylate-atropine (LOMOTIL) 2.5-0.025 MG per tablet 1 tablet, 1 tablet, Oral, Daily PRN, Elam Dutch, MD, 1 tablet at 01/13/16 1242 .  feeding supplement (ENSURE ENLIVE) (ENSURE ENLIVE) liquid 237 mL, 237 mL, Oral, TID BM, Elam Dutch, MD, 237 mL at 01/17/16 1000 .  guaiFENesin-codeine 100-10 MG/5ML solution 5 mL, 5 mL, Oral, Q6H PRN, Rigoberto Noel, MD, 5 mL at 01/11/16  0132 .  hydrALAZINE (APRESOLINE) injection 5 mg, 5 mg, Intravenous, Q20 Min PRN, Samantha J Rhyne, PA-C .  insulin aspart (novoLOG) injection 2-6 Units, 2-6 Units, Subcutaneous, Q4H, Corey Harold, NP, 2 Units at 01/16/16 2048 .  labetalol (NORMODYNE,TRANDATE) injection 10 mg, 10 mg, Intravenous, Q10 min PRN, Samantha J Rhyne, PA-C .  magnesium sulfate IVPB 2 g 50 mL, 2 g, Intravenous, Daily PRN, Samantha J Rhyne, PA-C .  metoprolol (LOPRESSOR) injection 2-5 mg, 2-5 mg, Intravenous, Q2H PRN, Hulen Shouts Rhyne, PA-C, 5 mg at 01/11/16 2053 .  metoprolol succinate (TOPROL-XL) 24 hr tablet 100 mg, 100 mg, Oral, Daily, Janalyn Harder Trinh, PA-C, 100 mg at 01/17/16 1131 .  morphine 2 MG/ML injection 2-4 mg, 2-4 mg, Intravenous, Q1H PRN, Rosetta Posner, MD, 2 mg at 01/14/16 0649 .  niacin CR capsule 750 mg, 750 mg, Oral, QHS, Sheema M Hallaji, RPH, 750 mg at 01/16/16 2154 .  nitroGLYCERIN (NITROSTAT) SL tablet 0.4 mg, 0.4 mg, Sublingual, Q5 min PRN, Elam Dutch, MD .  ondansetron [facility]) injection 4 mg, 4 mg, Intravenous, Q6H PRN, Hulen Shouts Rhyne, PA-C, 4 mg at 01/11/16 0919 .  oxyCODONE-acetaminophen (PERCOCET/ROXICET) 5-325 MG per tablet 1-2 tablet, 1-2 tablet, Oral, Q4H PRN, Hulen Shouts Rhyne, PA-C, 1 tablet at 01/13/16 2237 .  pantoprazole (PROTONIX) EC tablet 40 mg, 40 mg, Oral, Daily, Elam Dutch, MD, 40 mg at 01/17/16 1011 .  phenol (CHLORASEPTIC) mouth spray 1 spray, 1 spray, Mouth/Throat, PRN, Samantha J Rhyne, PA-C .  potassium chloride SA (K-DUR,KLOR-CON) CR tablet 40 mEq, 40 mEq, Oral, BID, Elam Dutch, MD, 40 mEq at 01/17/16 1010  Patients Current Diet: Diet Carb Modified Fluid consistency: Thin; Room service appropriate? Yes  Precautions / Restrictions Precautions Precautions: Fall Precaution Comments: chest tube, NG tube Restrictions Weight Bearing Restrictions: Yes LLE Weight Bearing: Non weight bearing   Has the patient had 2 or more falls or a fall with injury in  the past year?No  Prior Activity Level Community (5-7x/wk): very active, until 2 mos pta. bronchitis, fatigue and pain r LE limiting   pt very active gardening, bow master, pickle ball player until 2 months pta, limited by SOB and treated for bronchitis. Also limited by RLE pain  Home Assistive Devices / Equipment Home Assistive Devices/Equipment: Eyeglasses, Dentures (specify type) Home Equipment: None  Prior Device Use: Indicate devices/aids used by the patient prior to current illness, exacerbation or injury? None of the above  Prior Functional Level Prior Function Level of Independence: Independent Comments: pickle ball player, bow marksman, gardining  Self Care: Did the patient need help bathing, dressing, using the toilet or eating?  Independent  Indoor Mobility: Did the patient need assistance with walking from room to room (with or without device)? Independent  Stairs: Did the patient need assistance with internal or external stairs (with or without device)? Independent  Functional Cognition: Did the patient need  help planning regular tasks such as shopping or remembering to take medications? Independent  Current Functional Level Cognition Overall Cognitive Status: Within Functional Limits for tasks assessed Orientation Level: Oriented X4    Extremity Assessment (includes Sensation/Coordination) Upper Extremity Assessment: Generalized weakness  Lower Extremity Assessment: Defer to PT evaluation LLE Deficits / Details: pt reporting no phantom pain currently in left residual limb.  He was able to move it with trace contraction into hip flexion, abduction and extension assessment limited by pain, and his position in the bed.    ADLs Overall ADL's : Needs assistance/impaired Eating/Feeding: NPO Grooming: Wash/dry hands, Oral care, Wash/dry face, Minimal assistance, Bed level Upper Body Bathing: Moderate assistance, Bed level Lower Body Bathing: Total assistance, Bed  level Upper Body Dressing : Maximal assistance, Bed level Lower Body Dressing: Total assistance, Sit to/from stand Functional mobility during ADLs: +2 for physical assistance, Moderate assistance   Mobility Overal bed mobility: Needs Assistance Bed Mobility: Supine to Sit Rolling: Supervision (heavy use of rail, used momentum to sit up.) Sidelying to sit: Mod assist, +2 for safety/equipment, HOB elevated Supine to sit: Supervision (heavy use of rail, used momentum to sit up) Sit to supine: Min assist, +2 for safety/equipment General bed mobility comments: Incr time due to dyspnea; heavy use of rail; pt somewhat impulsive.   Transfers Overall transfer level: Needs assistance Equipment used: Rolling walker (2 wheeled) Transfers: Sit to/from Stand Sit to Stand: Min guard, +2 safety/equipment Stand pivot transfers: Min guard, +2 physical assistance, +2 safety/equipment General transfer comment: Pt does not want PT to assist but explained to pt PT must make sure he is safe.  Pt impulsive needing cues and close guard assist for safety.    Ambulation / Gait / Stairs / Wheelchair Mobility Ambulation/Gait Ambulation/Gait assistance: Mod assist, Min assist, +2 safety/equipment Ambulation Distance (Feet): 15 Feet Assistive device: Rolling walker (2 wheeled) Gait Pattern/deviations: Step-to pattern, Antalgic General Gait Details: Pt was able to walk around bed needing assist with walker sequencing.  Took multiple standing rest breaks due to dyspnea but less breaks than yesterday.   Pt moves impulsively and needs cues for safety awareness.   Gait velocity: decreased Gait velocity interpretation: Below normal speed for age/gender Stairs: Yes Stairs assistance: Supervision Stair Management: One rail Right, Step to pattern Number of Stairs: 4   Posture / Balance Dynamic Sitting Balance Sitting balance - Comments: Able to sit EOB with 1 UE support.  Balance Overall balance assessment: Needs  assistance, History of Falls Sitting-balance support: No upper extremity supported, Feet supported Sitting balance-Leahy Scale: Fair Sitting balance - Comments: Able to sit EOB with 1 UE support.  Postural control: Posterior lean Standing balance support: Bilateral upper extremity supported, During functional activity Standing balance-Leahy Scale: Poor Standing balance comment: Needs RW for support High level balance activites: Turns, Direction changes High Level Balance Comments: Needed min assist and cues for safety with sequencing steps and RW.    Special needs/care consideration BiPAP/CPAP no Continuous Drip IV no Oxygen did not use O2 pta, now on 2 liters Special Bed no Skin* Left BKA incision                               Bowel mgmt: Last BM 01/17/16 Bladder mgmt: Urinary catheter in place Diabetic mgmt pt states new dx this admission   Previous Home Environment Living Arrangements: Alone Available Help at Discharge: Other (Comment) (pt's "lady friend" local and dtr  from [place] visitng ) Type of Home: House Home Layout: One level Home Access: Stairs to enter Entrance Stairs-Rails: Can reach both Entrance Stairs-Number of Steps: 2 Bathroom Shower/Tub: Government social research officer Accessibility: No Home Care Services: No Additional Comments: Daughter in room during PT evaluation.   Discharge Living Setting Plans for Discharge Living Setting:  (to go stay with girlfriend, Raquel Sarna) Type of Home at Discharge: House Discharge Home Layout: One level Discharge Home Access: Level entry Discharge Bathroom Shower/Tub: Tub/shower unit, Walk-in shower (has both) Discharge Bathroom Toilet: Standard Discharge Bathroom Accessibility: Yes How Accessible: Accessible via walker Does the patient have any problems obtaining your medications?: No  Social/Family/Support Systems Patient Roles: Partner, Parent Contact Information: friend and dtr Anticipated  Caregiver: friend and dtr Anticipated Caregiver's Contact Information: see above Ability/Limitations of Caregiver: dtr lives in [place]. visitng at intervals. Friend available Discharge Plan Discussed with Primary Caregiver: Yes Is Caregiver In Agreement with Plan?: Yes Does Caregiver/Family have Issues with Lodging/Transportation while Pt is in Rehab?: No  Goals/Additional Needs Patient/Family Goal for Rehab: supervision to min assist PT and OT Expected length of stay: ELOS 12-16 days Pt/Family Agrees to Admission and willing to participate: Yes Program Orientation Provided & Reviewed with Pt/Caregiver Including Roles  & Responsibilities: Yes  Decrease burden of Care through IP rehab admission: n/a  Possible need for SNF placement upon discharge:not anticipated  Patient Condition: This patient's medical and functional status has changed since the consult dated: 01/13/2016 in which the Rehabilitation Physician determined and documented that the patient's condition is appropriate for intensive rehabilitative care in an inpatient rehabilitation facility. See "History of Present Illness" (above) for medical update. Functional changes are: Currently requiring min to mod assist to ambulate 15 feet RW + 2 assist safety/equipment. Patient's medical and functional status update has been discussed with the Rehabilitation physician and patient remains appropriate for inpatient rehabilitation. Will admit to inpatient rehab today.  Preadmission Screen Completed By:  Retta Diones, 01/17/2016 1:36 PM ______________________________________________________________________   Discussed status with Dr. Naaman Plummer on 01/17/16 at 1336 and received telephone approval for admission today.  Admission Coordinator:  Retta Diones, time1336/Date08/18/17       Cosigned by: Meredith Staggers, MD at 01/17/2016 1:41 PM  Revision History

## 2016-01-17 NOTE — Progress Notes (Signed)
Raymond Lorie Phenix, MD Physician Signed Physical Medicine and Rehabilitation  Consult Note Date of Service: 01/13/2016 8:03 AM  Related encounter: Admission (Discharged) from 01/01/2016 in [place] All Collapse All   [] Hide copied text      Physical Medicine and Rehabilitation Consult Reason for Consult: Left AKA Referring Physician: Dr. Oneida Alar   HPI: Raymond Jacobson a 75 y.o.right handed malewith history of CAD with stenting, DVT maintained on Pradaxa, hypertension, hyperlipidemia and peripheral vascular disease with revascularization procedures in the past and recurrent occlusion of left femoral-popliteal bypass with recent thrombectomy.Per chart review patient lives alone and was independent and active prior to admission. He plans to stay with male companion and assistance as needed. One level home with 2 steps to entry.Presented 01/02/2016 with ischemic changes left lower extremity findings of occlusion of left femoral-popliteal bypass with thrombectomy. No change with attempted revascularization procedure. 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 Oneida Alar. Hospital course pain management. Acute blood loss anemia 8.9-9.9 and monitored. Subcutaneous heparin for DVT prophylaxis. On 01/08/2016 complaintsof abdominal pain/shortness of breath with chest x-raycompleted showing moderate size right-sided pneumothoraxas well as increasing air opacities at the right lower lobe and right middle lobe concerning for aspiration.Maintained on broad-spectrum antibiotics.A chest tube was placedand patient was transferred to ICU.CT of the chest positive for acute pulmonary emboli with CT evidence of right heart strain.Echocardiogram with ejection fraction of 60% and grade 1 diastolic dysfunction.Intravenous heparin was initiated..Chest tube removed 01/12/2016.Physical therapy evaluation completed. M.D. has requested physical medicine rehabilitation  consult   ROS Constitutional: Negative for chillsand fever.  HENT: Negative for hearing loss.  Eyes: Negative for blurred visionand double vision.  Respiratory: Positive for cough.  Occasional shortness of breath with exertion Cardiovascular: Positive for leg swelling. Negative for chest pain.  Gastrointestinal:  GERD Genitourinary: Positive for urgency. Negative for dysuriaand hematuria.  Musculoskeletal: Positive for joint painand myalgias.  Skin: Negative for rash.  Neurological: Positive for weakness. Negative for seizuresand headaches.  All other systems reviewed and are negative        Past Medical History:  Diagnosis Date  . Angina    last time >yr  . Arthritis    "knees, ankles" (10/15/2015)  . Basal cell carcinoma of scalp    "froze off"  . CAD (coronary artery disease)    Previous stents last 6 years ago [place]  . Complication of anesthesia   . Cough    recent bronchitis- dry cough  . Dry cough   . DVT (deep venous thrombosis) (HCC) ~ 1969   LLE  . Elevated lipids   . GERD (gastroesophageal reflux disease)    occ -tums  . High cholesterol   . History of hiatal hernia    hx  . Hypertension   . Myocardial infarction [facility]) ~ 2006   "blood work"  . Peripheral vascular disease ([place])   . PONV (postoperative nausea and vomiting)   . Shortness of breath 04/13/11   "mostly w/exertion"/ now with bronchitis        Past Surgical History:  Procedure Laterality Date  . AMPUTATION Left 01/07/2016   Procedure: AMPUTATION LEFT LEG  ABOVE KNEE;  Surgeon: Elam Dutch, MD;  Location: [place];  Service: Vascular;  Laterality: Left;  . CARPAL TUNNEL RELEASE Right 1990s  . CORONARY ANGIOPLASTY WITH STENT PLACEMENT  ~ 2004; ~ 2006; 04/13/11   "# of stents 1 (i2004);+2 (2006)+ 1 (04/13/11)= 4 total"  . ENDARTERECTOMY  FEMORAL Left 01/01/2016   Procedure: LEFT FEMORAL ARTERY  ENDARTERECTOMY ;  Surgeon: Elam Dutch,  MD;  Location: [place];  Service: Vascular;  Laterality: Left;  . FEMORAL ARTERY STENT Left 10/15/2015  . FEMORAL-POPLITEAL BYPASS GRAFT Left 01/01/2016   Procedure: Thrombectomy of Left Leg Femoral-Popliteal Bypass Graft;  Surgeon: Elam Dutch, MD;  Location: [place];  Service: Vascular;  Laterality: Left;  . FEMORAL-POPLITEAL BYPASS GRAFT Left 01/01/2016   Procedure: LEFT  FEMORAL-POPLITEAL ARTERY  BYPASS GRAFT ;  Surgeon: Elam Dutch, MD;  Location: [place];  Service: Vascular;  Laterality: Left;  . FEMORAL-POPLITEAL BYPASS GRAFT Left 01/06/2016   Procedure: THROMBECTOMY WITH REVISION oF dISTAL Anastomosis BYPASS GRAFT FEMORAL-POPLITEAL ARTERY.;  Surgeon: Rosetta Posner, MD;  Location: [place];  Service: Vascular;  Laterality: Left;  . FEMORAL-POPLITEAL BYPASS GRAFT Left 01/06/2016   Procedure: REDO FEMORAL to BELOW THE KNEE POPLITEAL ARTERY BYPASS USING 46mm PROPATEN GRAFT;  Surgeon: Elam Dutch, MD;  Location: [place];  Service: Vascular;  Laterality: Left;  . FINGER AMPUTATION  1967   "partial; right pointer"  . INTRAOPERATIVE ARTERIOGRAM Left 01/01/2016   Procedure: INTRA OPERATIVE ARTERIOGRAM;  Surgeon: Elam Dutch, MD;  Location: [place];  Service: Vascular;  Laterality: Left;  . LEFT HEART CATHETERIZATION WITH CORONARY ANGIOGRAM N/A 04/13/2011   Procedure: LEFT HEART CATHETERIZATION WITH CORONARY ANGIOGRAM;  Surgeon: Josue Hector, MD;  [facility] CATH LAB;  Service: Cardiovascular;  Laterality: N/A;  . LEFT HEART CATHETERIZATION WITH CORONARY ANGIOGRAM N/A 06/20/2013   Procedure: LEFT HEART CATHETERIZATION WITH CORONARY ANGIOGRAM;  Surgeon: Leonie Man, MD;  Location: [facility] CATH LAB;  Service: Cardiovascular;  Laterality: N/A;  . PERCUTANEOUS CORONARY STENT INTERVENTION (PCI-S) Right 04/13/2011   Procedure: PERCUTANEOUS CORONARY STENT INTERVENTION (PCI-S);  Surgeon: Burnell Blanks, MD;  Location: [facility] CATH LAB;  Service: Cardiovascular;  Laterality: Right;  . PERIPHERAL  VASCULAR CATHETERIZATION N/A 10/15/2015   Procedure: Abdominal Aortogram w/Lower Extremity;  Surgeon: Serafina Mitchell, MD;  Location: [place] CV LAB;  Service: Cardiovascular;  Laterality: N/A;  . TONSILLECTOMY     "when I was a young one"  . Tricep Surgery  ~ 2007   "right arm; tore all my muscles @ my elbow"  . VEIN HARVEST Left 01/01/2016   Procedure: USING NON-REVERSE LEFT GREATER SAPHENOUS VEIN HARVEST;  Surgeon: Elam Dutch, MD;  Location: [facility] OR;  Service: Vascular;  Laterality: Left;        Family History  Problem Relation Age of Onset  . Cancer Father   . Heart attack Paternal Grandfather    Social History:  reports that he quit smoking about 48 years ago. His smoking use included Cigarettes. He has a 12.00 pack-year smoking history. He has never used smokeless tobacco. He reports that he drinks alcohol. He reports that he does not use drugs. Allergies:      Allergies  Allergen Reactions  . No Known Allergies    Medications Prior to Admission  Medication Sig Dispense Refill  . atorvastatin (LIPITOR) 80 MG tablet Take 1 tablet (80 mg total) by mouth every evening. (Patient taking differently: Take 40 mg by mouth every evening. ) 30 tablet 11  . chlorthalidone (HYGROTON) 25 MG tablet Take 12.5 mg by mouth daily.    . dabigatran (PRADAXA) 150 MG CAPS capsule Take 150 mg by mouth 2 (two) times daily.    . diphenoxylate-atropine (LOMOTIL) 2.5-0.025 MG tablet Take 1 tablet by mouth daily as needed for  diarrhea or loose stools.    . enalapril (VASOTEC) 20 MG tablet Take 1 tablet (20 mg total) by mouth 2 (two) times daily. 60 tablet 11  . isosorbide mononitrate (IMDUR) 30 MG 24 hr tablet Take 30 mg by mouth 2 (two) times daily.    . metoprolol (TOPROL-XL) 200 MG 24 hr tablet Take 100 mg by mouth daily.    . niacin (NIASPAN) 750 MG CR tablet Take 750 mg by mouth at bedtime.      Marland Kitchen NIFEdipine (ADALAT CC) 90 MG 24 hr tablet Take 90 mg by mouth every  evening.    Marland Kitchen OVER THE COUNTER MEDICATION Take 1 capsule by mouth daily. "Super Beta Prostate" supplement    . nitroGLYCERIN (NITROSTAT) 0.4 MG SL tablet Place 1 tablet (0.4 mg total) under the tongue every 5 (five) minutes as needed for chest pain. 25 tablet 12    Home: Home Living Family/patient expects to be discharged to:: Private residence Living Arrangements: Alone Available Help at Discharge: Friend(s) Type of Home: House Home Access: Stairs to enter [employer] of Steps: 2 Entrance Stairs-Rails: Can reach both Home Layout: One level Bathroom Shower/Tub: Chiropodist: Standard Bathroom Accessibility: No Home Equipment: None Additional Comments: Daughter in room during PT evaluation.   Functional History: Prior Function Level of Independence: Independent Functional Status:  Mobility: Bed Mobility Overal bed mobility: Needs Assistance Bed Mobility: Supine to Sit Supine to sit: Mod assist, +2 for physical assistance, +2 for safety/equipment, HOB elevated Sit to supine: +2 for physical assistance, Mod assist General bed mobility comments: Pt refused EOB activity due to fatigue  Transfers Overall transfer level: Needs assistance Equipment used: Rolling walker (2 wheeled) Transfers: Sit to/from Stand, [employer] Transfers Sit to Stand: Mod assist, +2 physical assistance, +2 safety/equipment Stand pivot transfers: Min assist, +2 safety/equipment General transfer comment: Assist of 2 to stand from EOB with cues for hand placement and technique. Stood from [facility] as well. SPT bed to [facility] and BSC to chair Min A for balance. Sp02 dropped to mid 80s on RA. Donned 02 and able to maintain in90s on 6L/min 02. Ambulation/Gait Ambulation/Gait assistance: Modified independent (Device/Increase time) Ambulation Distance (Feet): 200 Feet Assistive device: None (RW for for approx 74ft) Gait Pattern/deviations: Step-through pattern, Antalgic General Gait  Details: steady with ambulation, no physical assist required, did not need RW, ambulated without device Gait velocity: decreased Gait velocity interpretation: Below normal speed for age/gender Stairs: Yes Stairs assistance: Supervision Stair Management: One rail Right, Step to pattern Number of Stairs: 4    ADL: ADL Overall ADL's : Needs assistance/impaired Eating/Feeding: NPO Grooming: Wash/dry hands, Oral care, Wash/dry face, Minimal assistance, Bed level Upper Body Bathing: Moderate assistance, Bed level Lower Body Bathing: Total assistance, Bed level Upper Body Dressing : Maximal assistance, Bed level Lower Body Dressing: Total assistance, Sit to/from stand Functional mobility during ADLs: +2 for physical assistance, Moderate assistance  Cognition: Cognition Overall Cognitive Status: Within Functional Limits for tasks assessed Orientation Level: Oriented X4 Cognition Arousal/Alertness: Awake/alert Behavior During Therapy: WFL for tasks assessed/performed Overall Cognitive Status: Within Functional Limits for tasks assessed  Blood pressure 119/67, pulse (!) 111, temperature 97.8 F (36.6 C), temperature source Oral, resp. rate 19, height 5\' 8"  (1.727 m), weight 88 kg (194 lb), SpO2 96 %. Physical Exam  Vitals reviewed. Constitutional: He is oriented to person, place, and time. He appears well-developed and well-nourished.  HENT:  Head: Normocephalic and atraumatic.  Eyes: Conjunctivae and EOM are normal.  Neck: Normal range of motion. Neck supple. No thyromegaly present.  Cardiovascular: Normal rate and regular rhythm.   Respiratory: Breath sounds normal.  Chest tube site is dressed Increased WOB  GI:  Abdomen is mildly distended. Positive bowel sounds nontender  Musculoskeletal: He exhibits edema and tenderness.  Left AKA  Neurological: He is alert and oriented to person, place, and time.  Motor: B/l UE 5/5, RLE 5/5 proximal to distal LLE: Hip flexion 5/5    Skin: Skin is warm and dry.  AKA site is dressed appropriately tender  Psychiatric: He has a normal mood and affect. His behavior is normal. Thought content normal.    Lab Results Last 24 Hours       Results for orders placed or performed during the hospital encounter of 01/01/16 (from the past 24 hour(s))  Glucose, capillary     Status: Abnormal   Collection Time: 01/12/16 11:45 AM  Result Value Ref Range   Glucose-Capillary 168 (H) 65 - 99 mg/dL   Comment 1 Capillary Specimen   Glucose, capillary     Status: Abnormal   Collection Time: 01/12/16  5:29 PM  Result Value Ref Range   Glucose-Capillary 189 (H) 65 - 99 mg/dL  Heparin level (unfractionated)     Status: None   Collection Time: 01/12/16  5:59 PM  Result Value Ref Range   Heparin Unfractionated 0.38 0.30 - 0.70 IU/mL  Glucose, capillary     Status: Abnormal   Collection Time: 01/12/16  8:48 PM  Result Value Ref Range   Glucose-Capillary 137 (H) 65 - 99 mg/dL   Comment 1 Capillary Specimen   Glucose, capillary     Status: Abnormal   Collection Time: 01/12/16 11:19 PM  Result Value Ref Range   Glucose-Capillary 124 (H) 65 - 99 mg/dL  Glucose, capillary     Status: Abnormal   Collection Time: 01/13/16  3:11 AM  Result Value Ref Range   Glucose-Capillary 197 (H) 65 - 99 mg/dL   Comment 1 Capillary Specimen   Basic metabolic panel     Status: Abnormal   Collection Time: 01/13/16  3:48 AM  Result Value Ref Range   Sodium 136 135 - 145 mmol/L   Potassium 3.0 (L) 3.5 - 5.1 mmol/L   Chloride 94 (L) 101 - 111 mmol/L   CO2 29 22 - 32 mmol/L   Glucose, Bld 205 (H) 65 - 99 mg/dL   BUN 23 (H) 6 - 20 mg/dL   Creatinine, Ser 1.24 0.61 - 1.24 mg/dL   Calcium 8.3 (L) 8.9 - 10.3 mg/dL   GFR calc non Af Amer 55 (L) >60 mL/min   GFR calc Af Amer >60 >60 mL/min   Anion gap 13 5 - 15  CBC     Status: Abnormal   Collection Time: 01/13/16  3:48 AM  Result Value Ref Range   WBC 13.8 (H) 4.0 - 10.5  K/uL   RBC 3.82 (L) 4.22 - 5.81 MIL/uL   Hemoglobin 10.8 (L) 13.0 - 17.0 g/dL   HCT 33.4 (L) 39.0 - 52.0 %   MCV 87.4 78.0 - 100.0 fL   MCH 28.3 26.0 - 34.0 pg   MCHC 32.3 30.0 - 36.0 g/dL   RDW 14.4 11.5 - 15.5 %   Platelets 147 (L) 150 - 400 K/uL  Heparin level (unfractionated)     Status: Abnormal   Collection Time: 01/13/16  3:48 AM  Result Value Ref Range   Heparin Unfractionated >2.20 (H) 0.30 - 0.70  IU/mL      Imaging Results (Last 48 hours)  Dg Chest Port 1 View  Result Date: 01/12/2016 CLINICAL DATA:  Chest tube EXAM: PORTABLE CHEST 1 VIEW COMPARISON:  01/11/2016 FINDINGS: Right chest tube unchanged in position. No pneumothorax on today's study. Mild right lower lobe atelectasis has improved. Negative for heart failure or effusion. NG tube removed. IMPRESSION: Negative for pneumothorax Improvement in right lower lobe atelectasis. Electronically Signed   By: Franchot Gallo M.D.   On: 01/12/2016 07:33     Assessment/Plan: Diagnosis: Left AKA Labs and images independently reviewed.  Records reviewed and summated above. Clean amputation daily with soap and water Monitor incision site for signs of infection or impending skin breakdown. Staples to remain in place for 3-4 weeks Stump shrinker, for edema control  Scar mobilization massaging to prevent soft tissue adherence Stump protector during therapies Prevent flexion contractures by implementing the following:                        Encourage prone lying for 20-30 mins per day BID to avoid hip flexion                 Contractures if medically appropriate;                       Avoid pillow under knees when patient is lying in bed in order to prevent hip flexion contractures;                       Avoid prolonged sitting Post surgical pain control with oral medication Phantom limb pain control with physical modalities including desensitization techniques (gentle self massage to the residual stump,hot packs if  sensation iintact, [place]) and mirror therapy, TENS. If ineffective, consider pharmacological treatment for neuropathic pain (e.g gabapentin, pregabalin, amytriptalyine, duloxetine).  Avoid injury to contralateral side   1. Does the need for close, 24 hr/day medical supervision in concert with the patient's rehab needs make it unreasonable for this patient to be served in a less intensive setting? Yes 2. Co-Morbidities requiring supervision/potential complications: CAD with stenting (cont meds, Monitor in accordance with increased physical activity and avoid UE resistance excercises), DVT (cont meds), HTN (monitor and provide prns in accordance with increased physical exertion and pain), hyperlipidemia (cont meds), peripheral vascular disease with revascularization procedures (cont meds), post-op pain management (Biofeedback training with therapies to help reduce reliance on opiate pain medications, monitor pain control during therapies, and sedation at rest and titrate to maximum efficacy to ensure participation and gains in therapies), Acute blood loss anemia (transfuse if necessary to ensure appropriate perfusion for increased activity tolerance), right lower lobe PNA (cont abx), pneumothorax (CT removed, cont monitor), grade 1 diastolic dysfunction (monitor weights), tachypnea (monitor RR and O2 Sats with increased physical exertion), Tachycardia (monitor in accordance with pain and increasing activity), hypokalemia (continue to monitor and replete as necessary, consider Mag check), leukocytosis (cont to monitor for signs and symptoms of infection, further workup if indicated), Thrombocytopenia (< 60,000/mm3 no resistive exercise) 3. Due to bladder management, skin/wound care, disease management, pain management and patient education, does the patient require 24 hr/day rehab nursing? Yes 4. Does the patient require coordinated care of a physician, rehab nurse, PT (1-2 hrs/day, 5 days/week) and OT (1-2  hrs/day, 5 days/week) to address physical and functional deficits in the context of the above medical diagnosis(es)? Yes Addressing deficits in the following areas: balance, endurance,  locomotion, strength, transferring, bowel/bladder control, bathing, toileting and psychosocial support 5. Can the patient actively participate in an intensive therapy program of at least 3 hrs of therapy per day at least 5 days per week? Does not appear to be able to tolerate at present 6. The potential for patient to make measurable gains while on inpatient rehab is excellent 7. Anticipated functional outcomes upon discharge from inpatient rehab are supervision and min assist  with PT, supervision and min assist with OT, n/a with SLP. 8. Estimated rehab length of stay to reach the above functional goals is: 12-16 days. 9. Does the patient have adequate social supports and living environment to accommodate these discharge functional goals? Yes 10. Anticipated D/C setting: Home 11. Anticipated post D/C treatments: HH therapy and Home excercise program 12. Overall Rehab/Functional Prognosis: good  RECOMMENDATIONS: This patient's condition is appropriate for continued rehabilitative care in the following setting: CIR once medically stable and able to tolerate 3 hours therapy/day. Patient has agreed to participate in recommended program. Yes Note that insurance prior authorization may be required for reimbursement for recommended care.  Comment: Rehab Admissions Coordinator to follow up.  Delice Lesch, MD 01/13/2016    Revision History                        Routing History

## 2016-01-17 NOTE — Interval H&P Note (Signed)
Chalon Elia was admitted today to Inpatient Rehabilitation with the diagnosis of left AKA.  The patient's history has been reviewed, patient examined, and there is no change in status.  Patient continues to be appropriate for intensive inpatient rehabilitation.  I have reviewed the patient's chart and labs.  Questions were answered to the patient's satisfaction. The PAPE has been reviewed and assessment remains appropriate.  SWARTZ,ZACHARY T 01/17/2016, 4:21 PM

## 2016-01-17 NOTE — Progress Notes (Signed)
Rehab admissions - I met with patient and his girlfriend at the bedside.  Patient medically cleared for acute inpatient rehab admission for today.  Bed available and will admit to inpatient rehab today.  Call me for questions.  #[phone]

## 2016-01-17 NOTE — Care Management Note (Signed)
Case Management Note Previous CM note initiated by Tomi Bamberger RN, CM  Patient Details  Name: Raymond Jacobson MRN: [id number] Date of Birth: 1940/10/09  Subjective/Objective:   Redo Fem pop prior to 8/7 ,on 8/7 had 3rd redo,8/8 went for Left AKA.  8/9 am abd hard and distended. KUB shows R tension ptx, sats down to 88, put on 5 liters, has ng tube to suction, and right chest tube to suction.  Patient is preoperatively set up with [facility] for HHRN/HHPT.  He follows up with [facility]  [phone].  Fax [phone].  Will fax dc summary at time of dc.  NCM cont to follow for dc needs.                  Action/Plan: tx from 3S to 2W  Expected Discharge Date:    01/17/16              Expected Discharge Plan:  IP Rehab Facility  In-House Referral:  Clinical Social Work  Discharge planning Services  CM Consult  Post Acute Care Choice:    Choice offered to:     DME Arranged:    DME Agency:     HH Arranged:  PT, RN [place] Agency:  [place]  Status of Service:  Completed, signed off  If discussed at [employer] of Stay Meetings, dates discussed:    Additional Comments:  01/17/16- 1100- Dent Plantz RN, CM- pt to d/c to CIR today- have confirmed with [facility] that CIR has bed available today and they are ready to take pt later today. D/C order has been placed.  01/15/16- Marvetta Gibbons RN, CM- spoke with daughter Andee Poles- who would like to look at CIR vs STSNF for her dad- pt is agreeable- CIR has been consulted and is following for pt tolerance to therapy- CSW is also following and pt has chosen [facility]- have explained to daughter that [place] only contracts with certain SNF and does not contract with any in [place] to come and speak further with daughter regarding using VA benefits for SNF vs pt's Medicare benefits- CIR also to come speak with pt and daughter as they are likely to offer a bed at end of week.   Dahlia Client Sutherland, RN 01/17/2016,  11:54 AM [phone]

## 2016-01-18 ENCOUNTER — Inpatient Hospital Stay (HOSPITAL_COMMUNITY): Payer: No Typology Code available for payment source | Admitting: Physical Therapy

## 2016-01-18 ENCOUNTER — Inpatient Hospital Stay (HOSPITAL_COMMUNITY): Payer: No Typology Code available for payment source

## 2016-01-18 DIAGNOSIS — D62 Acute posthemorrhagic anemia: Secondary | ICD-10-CM

## 2016-01-18 DIAGNOSIS — I739 Peripheral vascular disease, unspecified: Secondary | ICD-10-CM

## 2016-01-18 DIAGNOSIS — I519 Heart disease, unspecified: Secondary | ICD-10-CM

## 2016-01-18 DIAGNOSIS — R140 Abdominal distension (gaseous): Secondary | ICD-10-CM

## 2016-01-18 LAB — CBC WITH DIFFERENTIAL/PLATELET
BASOS ABS: 0 10*3/uL (ref 0.0–0.1)
Basophils Relative: 0 %
EOS ABS: 0.1 10*3/uL (ref 0.0–0.7)
Eosinophils Relative: 2 %
HCT: 27 % — ABNORMAL LOW (ref 39.0–52.0)
HEMOGLOBIN: 9.1 g/dL — AB (ref 13.0–17.0)
LYMPHS ABS: 0.7 10*3/uL (ref 0.7–4.0)
LYMPHS PCT: 11 %
MCH: 29.5 pg (ref 26.0–34.0)
MCHC: 33.7 g/dL (ref 30.0–36.0)
MCV: 87.7 fL (ref 78.0–100.0)
Monocytes Absolute: 0.6 10*3/uL (ref 0.1–1.0)
Monocytes Relative: 9 %
NEUTROS PCT: 78 %
Neutro Abs: 5.4 10*3/uL (ref 1.7–7.7)
PLATELETS: 188 10*3/uL (ref 150–400)
RBC: 3.08 MIL/uL — AB (ref 4.22–5.81)
RDW: 14.7 % (ref 11.5–15.5)
WBC: 6.8 10*3/uL (ref 4.0–10.5)

## 2016-01-18 LAB — GLUCOSE, CAPILLARY
GLUCOSE-CAPILLARY: 163 mg/dL — AB (ref 65–99)
GLUCOSE-CAPILLARY: 222 mg/dL — AB (ref 65–99)
GLUCOSE-CAPILLARY: 176 mg/dL — AB (ref 65–99)
Glucose-Capillary: 115 mg/dL — ABNORMAL HIGH (ref 65–99)

## 2016-01-18 LAB — COMPREHENSIVE METABOLIC PANEL
ALK PHOS: 114 U/L (ref 38–126)
ALT: 20 U/L (ref 17–63)
AST: 19 U/L (ref 15–41)
Albumin: 2 g/dL — ABNORMAL LOW (ref 3.5–5.0)
Anion gap: 8 (ref 5–15)
BUN: 9 mg/dL (ref 6–20)
CALCIUM: 7.8 mg/dL — AB (ref 8.9–10.3)
CHLORIDE: 101 mmol/L (ref 101–111)
CO2: 24 mmol/L (ref 22–32)
CREATININE: 0.84 mg/dL (ref 0.61–1.24)
GFR calc non Af Amer: >60 mL/min (ref >60)
GLUCOSE: 125 mg/dL — AB (ref 65–99)
Potassium: 3.1 mmol/L — ABNORMAL LOW (ref 3.5–5.1)
SODIUM: 133 mmol/L — AB (ref 135–145)
Total Bilirubin: 0.4 mg/dL (ref 0.3–1.2)
Total Protein: 4.8 g/dL — ABNORMAL LOW (ref 6.5–8.1)

## 2016-01-18 LAB — C DIFFICILE QUICK SCREEN W PCR REFLEX
C DIFFICILE (CDIFF) TOXIN: NEGATIVE
C Diff antigen: NEGATIVE
C Diff interpretation: NOT DETECTED

## 2016-01-18 MED ORDER — ENSURE ENLIVE PO LIQD
237.0000 mL | Freq: Two times a day (BID) | ORAL | Status: DC
  Administered 2016-01-18 – 2016-01-21 (×3): 237 mL via ORAL

## 2016-01-18 NOTE — Evaluation (Signed)
Occupational Therapy Assessment and Plan  Patient Details  Name: Raymond Jacobson MRN: [id number] Date of Birth: September 10, 1940  OT Diagnosis: muscle weakness (generalized) Rehab Potential: Rehab Potential (ACUTE ONLY): Excellent ELOS: 7-10 days   Today's Date: 01/18/2016 OT Individual Time: 1045-1200 OT Individual Time Calculation (min): 75 min      Problem List:  Patient Active Problem List   Diagnosis Date Noted  . Status post above knee amputation of left lower extremity ([place]) 01/17/2016  . Abdominal distension   . Unilateral AKA ([place])   . Coronary artery disease involving native coronary artery of native heart without angina pectoris   . 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 HLD (hyperlipidemia)   . Post-operative pain   . Acute blood loss anemia   . Right lower lobe pneumonia   . Diastolic dysfunction   . Tachypnea   . Tachycardia   . Hypokalemia   . Leukocytosis   . Thrombocytopenia ([place])   . Surgery, other elective   . Pain of upper abdomen   . Pneumothorax on right   . Chest trauma   . Acute respiratory failure ([place])   . Pre-operative cardiovascular examination 12/31/2015  . PAD (peripheral artery disease) ([place]) 10/15/2015  . ACS (acute coronary syndrome) ([place]) 06/20/2013  . HTN (hypertension) 04/13/2011  . Elevated lipids 04/13/2011  . CAD S/P multiple PCIs 04/13/2011    Past Medical History:  Past Medical History:  Diagnosis Date  . Angina    last time >yr  . Arthritis    "knees, ankles" (10/15/2015)  . Basal cell carcinoma of scalp    "froze off"  . CAD (coronary artery disease)    Previous stents last 6 years ago [place]  . Complication of anesthesia   . Cough    recent bronchitis- dry cough  . Dry cough   . DVT (deep venous thrombosis) ([facility]) ~ 1969   LLE  . Elevated lipids   . GERD (gastroesophageal reflux disease)    occ -tums  . High cholesterol   . History of hiatal hernia    hx  . Hypertension   . Myocardial  infarction [facility]) ~ 2006   "blood work"  . Peripheral vascular disease ([place])   . PONV (postoperative nausea and vomiting)   . Shortness of breath 04/13/11   "mostly w/exertion"/ now with bronchitis   Past Surgical History:  Past Surgical History:  Procedure Laterality Date  . AMPUTATION Left 01/07/2016   Procedure: AMPUTATION LEFT LEG  ABOVE KNEE;  Surgeon: Elam Dutch, MD;  Location: [place];  Service: Vascular;  Laterality: Left;  . CARPAL TUNNEL RELEASE Right 1990s  . CORONARY ANGIOPLASTY WITH STENT PLACEMENT  ~ 2004; ~ 2006; 04/13/11   "# of stents 1 (i2004);+2 (2006)+ 1 (04/13/11)= 4 total"  . ENDARTERECTOMY FEMORAL Left 01/01/2016   Procedure: LEFT FEMORAL ARTERY  ENDARTERECTOMY ;  Surgeon: Elam Dutch, MD;  Location: [place];  Service: Vascular;  Laterality: Left;  . FEMORAL ARTERY STENT Left 10/15/2015  . FEMORAL-POPLITEAL BYPASS GRAFT Left 01/01/2016   Procedure: Thrombectomy of Left Leg Femoral-Popliteal Bypass Graft;  Surgeon: Elam Dutch, MD;  Location: [place];  Service: Vascular;  Laterality: Left;  . FEMORAL-POPLITEAL BYPASS GRAFT Left 01/01/2016   Procedure: LEFT  FEMORAL-POPLITEAL ARTERY  BYPASS GRAFT ;  Surgeon: Elam Dutch, MD;  Location: [place];  Service: Vascular;  Laterality: Left;  . FEMORAL-POPLITEAL BYPASS GRAFT Left 01/06/2016   Procedure: THROMBECTOMY WITH REVISION  oF dISTAL Anastomosis BYPASS GRAFT FEMORAL-POPLITEAL ARTERY.;  Surgeon: Rosetta Posner, MD;  Location: [place];  Service: Vascular;  Laterality: Left;  . FEMORAL-POPLITEAL BYPASS GRAFT Left 01/06/2016   Procedure: REDO FEMORAL to BELOW THE KNEE POPLITEAL ARTERY BYPASS USING 62m PROPATEN GRAFT;  Surgeon: CElam Dutch MD;  [place]  Service: Vascular;  Laterality: Left;  . FINGER AMPUTATION  1967   "partial; right pointer"  . INTRAOPERATIVE ARTERIOGRAM Left 01/01/2016   Procedure: INTRA OPERATIVE ARTERIOGRAM;  Surgeon: CElam Dutch MD;  [place]  Service: Vascular;  Laterality: Left;   . LEFT HEART CATHETERIZATION WITH CORONARY ANGIOGRAM N/A 04/13/2011   Procedure: LEFT HEART CATHETERIZATION WITH CORONARY ANGIOGRAM;  Surgeon: PJosue Hector MD;  [facility];  Service: Cardiovascular;  Laterality: N/A;  . LEFT HEART CATHETERIZATION WITH CORONARY ANGIOGRAM N/A 06/20/2013   Procedure: LEFT HEART CATHETERIZATION WITH CORONARY ANGIOGRAM;  Surgeon: DLeonie Man MD;  [facility];  Service: Cardiovascular;  Laterality: N/A;  . PERCUTANEOUS CORONARY STENT INTERVENTION (PCI-S) Right 04/13/2011   Procedure: PERCUTANEOUS CORONARY STENT INTERVENTION (PCI-S);  Surgeon: CBurnell Blanks MD;  [facility];  Service: Cardiovascular;  Laterality: Right;  . PERIPHERAL VASCULAR CATHETERIZATION N/A 10/15/2015   Procedure: Abdominal Aortogram w/Lower Extremity;  Surgeon: VSerafina Mitchell MD;  [place];  Service: Cardiovascular;  Laterality: N/A;  . TONSILLECTOMY     "when I was a young one"  . Tricep Surgery  ~ 2007   "right arm; tore all my muscles @ my elbow"  . VEIN HARVEST Left 01/01/2016   Procedure: USING NON-REVERSE LEFT GREATER SAPHENOUS VEIN HARVEST;  Surgeon: CElam Dutch MD;  [facility];  Service: Vascular;  Laterality: Left;    Assessment & Plan Clinical Impression: Patient is a 75y.o. year old malewith history of CAD, GERD, A fib, cough, OA,  PVD with  claudication of the LLE affecting QOL. He was admitted on 01/01/16 for  left femoral endarterectomy and left femoral to above knee popliteal bypass by Dr. FOneida Alar Post op developed ischemia due to occlusion of graft and was taken back to OR that afternoon and repeat procedure on 08/07 for thrombectomy. He continued to have severe progressive ischemia and AKA recommended due to lack of options. On 08/08 patient underwent L-AKA without complications. Post op had confusion as well as abdominal distension due to ileus and large R- tension PTX noted on 08/9. Chest tube placed  at bedside and NGT placed for decompression of abdomen per Dr. WHulen Skains He developed hypoxia with N/V and concerns of aspiration PNA with RML consolidation and was started on IV antibiotics. PCCM consulted and he was treated with IV lasix for mild pulmonary edema and continued on  IV heparin. He had progressive dyspnea requiring NRB and was transferred to ICU on 8/10. CTA chest was positive for saddle PE, Left PE with large clot burden and right heart strain. EKOS held off due to risk of bleeding complication. BLE dopplers negative for DVT.  Vanc/Zosyn discontinued on 08/11 and chest tube removed on 08/13.  Abdominal function improving and NGT discontinued. Diet advanced to regular and patient continues to have loose stools. He developed hypotension with acute renal failure with rise in BUN/Cr-  34/1.61.  He was started on IVF and duretics/ACE discontinued 8/16 with improvement. Dr. FOneida Alarrecommends holding meds till SBP> 160. Anxiety levels are improving but he continues to have dyspnea/hypoxia with activity.  Patient transferred to  CIR on 01/17/2016 .    Patient currently requires mod with basic self-care skills secondary to muscle weakness.  Prior to hospitalization, patient could complete BADL/iADL independently.   Patient will benefit from skilled intervention to increase independence with basic self-care skills prior to discharge home with care partner.  Anticipate patient will require intermittent supervision and no further OT follow recommended.  OT - End of Session Activity Tolerance: Tolerates 30+ min activity with multiple rests Endurance Deficit: Yes Endurance Deficit Description: patient SOB on 2 L 02 via [place], required rest breaks with mobility OT Assessment Rehab Potential (ACUTE ONLY): Excellent Barriers to Discharge: Decreased caregiver support OT Patient demonstrates impairments in the following area(s): Balance;Endurance;Pain;Safety OT Basic ADL's Functional Problem(s):  Grooming;Bathing;Dressing;Toileting OT Transfers Functional Problem(s): Toilet;Tub/Shower OT Plan OT Intensity: Minimum of 1-2 x/day, 45 to 90 minutes OT Frequency: 5 out of 7 days OT Duration/Estimated Length of Stay: 7-10 days OT Treatment/Interventions: Discharge planning;DME/adaptive equipment instruction;Functional mobility training;Pain management;Patient/family education;Self Care/advanced ADL retraining;Therapeutic Activities;Therapeutic Exercise OT Self Feeding Anticipated Outcome(s): Mod I OT Basic Self-Care Anticipated Outcome(s): Supervision OT Toileting Anticipated Outcome(s): Mod I OT Bathroom Transfers Anticipated Outcome(s): Supervision OT Recommendation Patient destination: Home (Girlfriend's home 2-3 weeks) Follow Up Recommendations: None Equipment Details: Daughter purchased BSC and Tub bench (or shower chair?).   Skilled Therapeutic Intervention OT 1:1 evaluation completed with treatment provided to address methods and goals of treatment, energyty awatyty conservation, effective use of DME, safety awareness, and transfer skills.   Pt completed bathing at shower level with mod vc for sequencing, transfer technique, and steadying assist during transfers.  Pt fatigued quickly and required several rest breaks during session which he attempted w/o use of supplemental 02.   02 sats dropped to 89% after shower transfer and pt resumed use during remainder of session d/t SOB.   Per pt, his daughter already purchased a BSC and tub bench for his home.   His son is arriving to construct a ramp and place grab bars in bathroom; no tech assist requested as pt states his son works in English as a second language teacher as is Investment banker, corporate with ADA standards.  OT Evaluation Precautions/Restrictions OT  Precautions Precautions: Fall Restrictions Weight Bearing Restrictions: Yes LLE Weight Bearing: Non weight bearing   General Chart Reviewed: Yes Family/Caregiver Present: No   Vital Signs Therapy  Vitals Temp: 97.4 F (36.3 C) Temp Source: Oral Pulse Rate: (!) 49 BP: 121/65 Patient Position (if appropriate): Lying Oxygen Therapy SpO2: 92 % O2 Device: Not Delivered   Pain Pain Assessment Pain Assessment: 0-10 Pain Score: 0-No pain   Home Living/Prior Functioning Home Living Family/patient expects to be discharged to:: Private residence Living Arrangements: Alone Available Help at Discharge: Friend(s), Available 24 hours/day (Plans to live with girlfriend, 2 wks.) Type of Home: House Home Access: Stairs to enter [employer] of Steps: threshold step Entrance Stairs-Rails:  (uses back entrance, 1 step to deck, 1 step to entrance.) Home Layout: One level Bathroom Shower/Tub: Tub/shower unit, Multimedia programmer: Standard Bathroom Accessibility: Yes Additional Comments: will be staying with friend after d/c.  Only has threshold at entrance.  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, sports, worked for town Leisure: Hobbies-yes (Comment) Comments: pickle ball, bocce, gardening, cards, wood working, camping, Community education officer   ADL ADL ADL Comments: See Functional Assessment Tool   Vision/Perception  Vision- History Baseline Vision/History: No visual deficits Patient Visual  Report: No change from baseline Vision- Assessment Vision Assessment?: No apparent visual deficits   Cognition Overall Cognitive Status: (P) Within Functional Limits for tasks assessed Arousal/Alertness: Awake/alert Orientation Level: Person;Place;Situation Person: Oriented Place: Oriented Situation: Oriented Year: 2017 Month: August Day of Week: Correct Memory: Appears intact Immediate Memory Recall: Sock;Bed;Blue Memory Recall: Sock;Blue;Bed Memory Recall Sock: Without Cue Memory Recall Blue: Without Cue Memory Recall Bed: Without Cue Attention: Selective Selective Attention: Appears intact Awareness: (P) Appears intact Problem Solving: (P) Appears intact Executive Function: Reasoning Reasoning: Appears intact Safety/Judgment: Appears intact   Sensation Sensation Light Touch: Appears Intact Stereognosis: Appears Intact Hot/Cold: Appears Intact Proprioception: Appears Intact Coordination Gross Motor Movements are Fluid and Coordinated: No Fine Motor Movements are Fluid and Coordinated: Yes Coordination and Movement Description: Limited by generalized weakness and deconditioning   Motor  Motor Motor: Within Functional Limits   Mobility  Bed Mobility Bed Mobility: Supine to Sit Supine to Sit: 4: Min assist;With rails Transfers Sit to Stand: 4: Min assist;With upper extremity assist   Trunk/Postural Assessment  Cervical Assessment Cervical Assessment: Within Functional Limits Thoracic Assessment Thoracic Assessment: Within Functional Limits Lumbar Assessment Lumbar Assessment: Within Functional Limits Postural Control Postural Control: Deficits on evaluation Protective Responses: impaired   Balance Balance Balance Assessed: Yes Dynamic Sitting Balance Sitting balance - Comments: Able to sit EOB with 1 UE support.  Static Standing Balance Static Standing - Balance Support: Bilateral upper extremity supported Static Standing - Level of Assistance: 4:  Min assist Dynamic Standing Balance Dynamic Standing - Balance Support: During functional activity;Bilateral upper extremity supported Dynamic Standing - Level of Assistance: 4: Min assist   Extremity/Trunk Assessment RUE Assessment RUE Assessment: Within Functional Limits LUE Assessment LUE Assessment: Within Functional Limits  See Function Navigator for Current Functional Status.   Refer to Care Plan for Long Term Goals  Recommendations for other services: None  Discharge Criteria: Patient will be discharged from OT if patient refuses treatment 3 consecutive times without medical reason, if treatment goals not met, if there is a change in medical status, if patient makes no progress towards goals or if patient is discharged from hospital.  The above assessment, treatment plan, treatment alternatives and goals were discussed and mutually agreed upon: by patient   Second session: Time: 1400-1500 Time Calculation (min):  60 min  Pain Assessment: No/denies  Skilled Therapeutic Interventions: Therapeutic activities (45 min) with focus on functional mobility (w/c level), dynamic standing balance (standing supported at game board in TBI), and therex using Sci-Fit.   Following d/c planning discussion on use of DME and home mods, pt propelled w/c to gym and completed 15 min of UE ergometry, level 1 resistence while using supplemental 02.   Pt reports moderate fatigue (13 per BORG scale), rested briefly and propelled w/c to TBI room where he played 2 games of GoBan with OT while standing at table top with CGA for safety.   Pt reported fatigue from activities and requested return to bed.   Pt was escorted to his room and transferred back to his bed with steadying assist and min instructional cues on technique for safety.  See FIM for current functional status  Therapy/Group: Individual Therapy  Rhiana Morash  01/18/2016, 4:00 PM

## 2016-01-18 NOTE — Plan of Care (Signed)
Problem: RH SKIN INTEGRITY Goal: RH STG ABLE TO PERFORM INCISION/WOUND CARE W/ASSISTANCE STG Able To Perform Incision/Wound Care With Mod.Assistance.  Outcome: Not Progressing Max assist

## 2016-01-18 NOTE — Progress Notes (Signed)
Nutrition Brief Note  Patient identified on the Malnutrition Screening Tool (MST) Report. Pt s/p L AKA 8/8.  Wt Readings from Last 15 Encounters:  01/17/16 190 lb (86.2 kg)  01/01/16 194 lb (88 kg)  12/31/15 194 lb (88 kg)  12/24/15 195 lb 3.2 oz (88.5 kg)  12/12/15 196 lb (88.9 kg)  11/28/15 201 lb (91.2 kg)  10/16/15 209 lb 14.1 oz (95.2 kg)  10/09/15 213 lb (96.6 kg)  10/01/15 210 lb (95.3 kg)  06/20/13 220 lb 7.4 oz (100 kg)  04/13/11 209 lb 15.8 oz (95.2 kg)    Body mass index is 28.89 kg/m. Patient meets criteria for overweight based on current BMI.   Current diet order is carbohydrate modified, patient is consuming approximately 100% of meals at this time. Pt reports having a good appetite currently and PTA with no other difficulties. Pt currently has Ensure ordered TID. As intake has been adequate at meals. RD to modify orders to Ensure BID. Labs and medications reviewed.   No further nutrition interventions warranted at this time. If nutrition issues arise, please consult RD.   Corrin Parker, MS, RD, LDN Pager # [phone] After hours/ weekend pager # [phone]

## 2016-01-18 NOTE — Evaluation (Signed)
Physical Therapy Assessment and Plan  Patient Details  Name: Raymond Jacobson MRN: [id number] Date of Birth: Oct 15, 1940  PT Diagnosis: Difficulty walking, Edema, Muscle weakness and Pain in residual limb Rehab Potential: Good ELOS: 7-10 days   Today's Date: 01/18/2016 PT Individual Time: 0800-0905 PT Individual Time Calculation (min): 65 min     Problem List: Patient Active Problem List   Diagnosis Date Noted  . Status post above knee amputation of left lower extremity ([place]) 01/17/2016  . Abdominal distension   . Unilateral AKA ([place])   . Coronary artery disease involving native coronary artery of native heart without angina pectoris   . 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 HLD (hyperlipidemia)   . Post-operative pain   . Acute blood loss anemia   . Right lower lobe pneumonia   . Diastolic dysfunction   . Tachypnea   . Tachycardia   . Hypokalemia   . Leukocytosis   . Thrombocytopenia ([place])   . Surgery, other elective   . Pain of upper abdomen   . Pneumothorax on right   . Chest trauma   . Acute respiratory failure ([place])   . Pre-operative cardiovascular examination 12/31/2015  . PAD (peripheral artery disease) ([place]) 10/15/2015  . ACS (acute coronary syndrome) ([place]) 06/20/2013  . HTN (hypertension) 04/13/2011  . Elevated lipids 04/13/2011  . CAD S/P multiple PCIs 04/13/2011    Past Medical History:  Past Medical History:  Diagnosis Date  . Angina    last time >yr  . Arthritis    "knees, ankles" (10/15/2015)  . Basal cell carcinoma of scalp    "froze off"  . CAD (coronary artery disease)    Previous stents last 6 years ago [place] [facility]  . Complication of anesthesia   . Cough    recent bronchitis- dry cough  . Dry cough   . DVT (deep venous thrombosis) (HCC) ~ 1969   LLE  . Elevated lipids   . GERD (gastroesophageal reflux disease)    occ -tums  . High cholesterol   . History of hiatal hernia    hx  . Hypertension   . Myocardial  infarction [facility]) ~ 2006   "blood work"  . Peripheral vascular disease ([place])   . PONV (postoperative nausea and vomiting)   . Shortness of breath 04/13/11   "mostly w/exertion"/ now with bronchitis   Past Surgical History:  Past Surgical History:  Procedure Laterality Date  . AMPUTATION Left 01/07/2016   Procedure: AMPUTATION LEFT LEG  ABOVE KNEE;  Surgeon: Elam Dutch, MD;  Location: [place];  Service: Vascular;  Laterality: Left;  . CARPAL TUNNEL RELEASE Right 1990s  . CORONARY ANGIOPLASTY WITH STENT PLACEMENT  ~ 2004; ~ 2006; 04/13/11   "# of stents 1 (i2004);+2 (2006)+ 1 (04/13/11)= 4 total"  . ENDARTERECTOMY FEMORAL Left 01/01/2016   Procedure: LEFT FEMORAL ARTERY  ENDARTERECTOMY ;  Surgeon: Elam Dutch, MD;  Location: [place];  Service: Vascular;  Laterality: Left;  . FEMORAL ARTERY STENT Left 10/15/2015  . FEMORAL-POPLITEAL BYPASS GRAFT Left 01/01/2016   Procedure: Thrombectomy of Left Leg Femoral-Popliteal Bypass Graft;  Surgeon: Elam Dutch, MD;  Location: [place];  Service: Vascular;  Laterality: Left;  . FEMORAL-POPLITEAL BYPASS GRAFT Left 01/01/2016   Procedure: LEFT  FEMORAL-POPLITEAL ARTERY  BYPASS GRAFT ;  Surgeon: Elam Dutch, MD;  Location: [place];  Service: Vascular;  Laterality: Left;  . FEMORAL-POPLITEAL BYPASS GRAFT Left 01/06/2016   Procedure: THROMBECTOMY WITH  REVISION oF dISTAL Anastomosis BYPASS GRAFT FEMORAL-POPLITEAL ARTERY.;  Surgeon: Rosetta Posner, MD;  Location: [place];  Service: Vascular;  Laterality: Left;  . FEMORAL-POPLITEAL BYPASS GRAFT Left 01/06/2016   Procedure: REDO FEMORAL to BELOW THE KNEE POPLITEAL ARTERY BYPASS USING 74m PROPATEN GRAFT;  Surgeon: CElam Dutch MD;  [place]  Service: Vascular;  Laterality: Left;  . FINGER AMPUTATION  1967   "partial; right pointer"  . INTRAOPERATIVE ARTERIOGRAM Left 01/01/2016   Procedure: INTRA OPERATIVE ARTERIOGRAM;  Surgeon: CElam Dutch MD;  [place]  Service: Vascular;  Laterality: Left;   . LEFT HEART CATHETERIZATION WITH CORONARY ANGIOGRAM N/A 04/13/2011   Procedure: LEFT HEART CATHETERIZATION WITH CORONARY ANGIOGRAM;  Surgeon: PJosue Hector MD;  [facility];  Service: Cardiovascular;  Laterality: N/A;  . LEFT HEART CATHETERIZATION WITH CORONARY ANGIOGRAM N/A 06/20/2013   Procedure: LEFT HEART CATHETERIZATION WITH CORONARY ANGIOGRAM;  Surgeon: DLeonie Man MD;  [facility];  Service: Cardiovascular;  Laterality: N/A;  . PERCUTANEOUS CORONARY STENT INTERVENTION (PCI-S) Right 04/13/2011   Procedure: PERCUTANEOUS CORONARY STENT INTERVENTION (PCI-S);  Surgeon: CBurnell Blanks MD;  [facility];  Service: Cardiovascular;  Laterality: Right;  . PERIPHERAL VASCULAR CATHETERIZATION N/A 10/15/2015   Procedure: Abdominal Aortogram w/Lower Extremity;  Surgeon: VSerafina Mitchell MD;  [facility];  Service: Cardiovascular;  Laterality: N/A;  . TONSILLECTOMY     "when I was a young one"  . Tricep Surgery  ~ 2007   "right arm; tore all my muscles @ my elbow"  . VEIN HARVEST Left 01/01/2016   Procedure: USING NON-REVERSE LEFT GREATER SAPHENOUS VEIN HARVEST;  Surgeon: CElam Dutch MD;  [place]  Service: Vascular;  Laterality: Left;    Assessment & Plan Clinical Impression: JKasraBoutinis a 75y.o.malewith history of CAD, GERD, A fib, cough, OA,  PVD with  claudication of the LLE affecting QOL. He was admitted on 01/01/16 for  left femoral endarterectomy and left femoral to above knee popliteal bypass by Dr. FOneida Alar Post op developed ischemia due to occlusion of graft and was taken back to OR that afternoon and repeat procedure on 08/07 for thrombectomy. He continued to have severe progressive ischemia and AKA recommended due to lack of options. On 08/08 patient underwent L-AKA without complications. Post op had confusion as well as abdominal distension due to ileus and large R- tension PTX noted on 08/9. Chest tube placed at  bedside and NGT placed for decompression of abdomen per Dr. WHulen Skains He developed hypoxia with N/V and concerns of aspiration PNA with RML consolidation and was started on IV antibiotics. PCCM consulted and he was treated with IV lasix for mild pulmonary edema and continued on  IV heparin. He had progressive dyspnea requiring NRB and was transferred to ICU on 8/10. CTA chest was positive for saddle PE, Left PE with large clot burden and right heart strain. EKOS held off due to risk of bleeding complication. BLE dopplers negative for DVT.  Vanc/Zosyn discontinued on 08/11 and chest tube removed on 08/13.  Abdominal function improving and NGT discontinued. Diet advanced to regular and patient continues to have loose stools. He developed hypotension with acute renal failure with rise in BUN/Cr-  34/1.61.  He was started on IVF and duretics/ACE discontinued 8/16 with improvement. Dr. FOneida Alarrecommends holding meds till SBP> 160. Anxiety levels are improving but he continues to have dyspnea/hypoxia with activity. Patient transferred to CIR on 01/17/2016.  Patient currently requires min-modA with mobility secondary to muscle weakness and muscle joint tightness, decreased cardiorespiratoy endurance and decreased oxygen support, unbalanced muscle activation and decreased sitting balance, decreased standing balance, decreased postural control and decreased balance strategies.  Prior to hospitalization, patient was independent  with mobility and lived with Alone in a House home.  Home access is threshold step (Friend's house)Stairs to enter.  Patient will benefit from skilled PT intervention to maximize safe functional mobility, minimize fall risk and decrease caregiver burden for planned discharge home with intermittent assist.  Anticipate patient will benefit from follow up OP at discharge.  PT - End of Session Activity Tolerance: Decreased this session;Tolerates 30+ min activity with multiple rests Endurance  Deficit: Yes Endurance Deficit Description: patient SOB on 2 L 02 via [place], required rest breaks with mobility PT Assessment Rehab Potential (ACUTE/IP ONLY): Good PT Patient demonstrates impairments in the following area(s): Balance;Edema;Endurance;Motor;Nutrition;Pain;Perception;Skin Integrity PT Transfers Functional Problem(s): Bed Mobility;Bed to Chair;Car;Furniture PT Locomotion Functional Problem(s): Ambulation;Wheelchair Mobility;Stairs PT Plan PT Intensity: Minimum of 1-2 x/day ,45 to 90 minutes PT Frequency: 5 out of 7 days PT Duration Estimated Length of Stay: 7-10 days PT Treatment/Interventions: Ambulation/gait training;Balance/vestibular training;Community reintegration;Discharge planning;Disease management/prevention;DME/adaptive equipment instruction;Functional mobility training;Neuromuscular re-education;Pain management;Patient/family education;Psychosocial support;Skin care/wound management;Stair training;Therapeutic Activities;Therapeutic Exercise;UE/LE Strength taining/ROM;UE/LE Coordination activities;Wheelchair propulsion/positioning PT Transfers Anticipated Outcome(s): mod I PT Locomotion Anticipated Outcome(s): supervision household ambulator PT Recommendation Follow Up Recommendations: Outpatient PT Patient destination: Home Equipment Recommended: Wheelchair (measurements);Wheelchair cushion (measurements);Rolling walker with 5" wheels;Other (comment)  Skilled Therapeutic Intervention Skilled therapeutic intervention initiated after completion of evaluation. Discussed with patient falls risk, safety within room, focus of therapy during stay, possible length of stay, goals, and follow-up therapy. Evaluation limited by need for RN to apply dressing to inner thigh, RN and MD notified of drainage from incisions.  Patient requires min A overall for transfers and short distance ambulation using RW and min-mod A for stair negotiation and supervision for wheelchair propulsion.  Patient with decreased activity tolerance with Sp02 97% on 2 L 02 via [place] and requires seated rest breaks with functional mobility due to fatigue and SOB. Patient plans to DC to girlfriend's house which is more accessible, girlfriend is retired and can provide 24/7 supervision if needed. Patient education provided on pre-prosthetic training as patient very active PTA and desires to return to active lifestyle. Patient left sitting in wheelchair with all needs in reach.   PT Evaluation Precautions/Restrictions Precautions Precautions: Fall Restrictions Weight Bearing Restrictions: Yes LLE Weight Bearing: Non weight bearing General Chart Reviewed: Yes Family/Caregiver Present: No  Pain Pain Assessment Pain Assessment: No/denies pain Home Living/Prior Functioning Home Living Available Help at Discharge: Friend(s);Available 24 hours/day Type of Home: House Home Access: Stairs to enter [employer] of Steps: threshold step (Friend's house) Entrance Stairs-Rails: None Home Layout: One level (one level) Bathroom Shower/Tub: Tub/shower unit;Walk-in shower Bathroom Toilet: Standard Bathroom Accessibility: Yes (friend's) Additional Comments: will be staying with friend at [place] With: Alone Prior Function Level of Independence: Independent with transfers;Independent with gait;Independent with homemaking with ambulation  Able to Take Stairs?: Yes Driving: Yes 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, 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, sports, worked for town Leisure: Hobbies-yes (Comment) Comments: pickle ball, bocce, gardening, cards, wood working, camping Vision/Perception   No change from baseline  Cognition Overall Cognitive Status: (P) Within Functional Limits for tasks assessed Arousal/Alertness: (P) Awake/alert Orientation Level: Oriented X4 Attention: (P) Alternating Memory: (P) Appears intact Awareness: (P) Appears intact Problem  Solving: (P) Appears  intact Executive Function: (P) Reasoning Safety/Judgment: (P) Appears intact Sensation Sensation Light Touch: Appears Intact Stereognosis: Appears Intact Hot/Cold: Appears Intact Proprioception: Appears Intact Coordination Gross Motor Movements are Fluid and Coordinated: No Fine Motor Movements are Fluid and Coordinated: Yes Coordination and Movement Description: Limited by generalized weakness and deconditioning Motor  Motor Motor: Within Functional Limits  Mobility Bed Mobility Bed Mobility: Supine to Sit Supine to Sit: 4: Min assist;With rails Transfers Transfers: Yes Sit to Stand: 4: Min assist;With upper extremity assist Stand to Sit: 4: Min guard;With upper extremity assist Squat Pivot Transfers: 4: Min assist;With armrests;With upper extremity assistance Locomotion  Ambulation Ambulation: Yes Ambulation Distance (Feet): 10 Feet Assistive device: Rolling walker Gait Gait: Yes Gait Pattern: Impaired Gait Pattern: Step-to pattern (hop-to pattern) Gait velocity: decreased Stairs / Additional Locomotion Stairs: Yes Stairs Assistance: 3: Mod assist Stair Management Technique: Two rails;Step to pattern;Forwards;Backwards Number of Stairs: 2 Height of Stairs: 3 Architect: Yes Wheelchair Assistance: 5: Careers information officer: Both upper extremities Wheelchair Parts Management: Supervision/cueing Distance: 150 ft  Trunk/Postural Assessment  Cervical Assessment Cervical Assessment: Within Scientist, physiological Assessment: Within Functional Limits Lumbar Assessment Lumbar Assessment: Within Functional Limits Postural Control Postural Control: Deficits on evaluation Protective Responses: impaired  Balance Balance Balance Assessed: Yes Static Standing Balance Static Standing - Balance Support: Bilateral upper extremity supported Static Standing - Level of Assistance: 4: Min assist Dynamic  Standing Balance Dynamic Standing - Balance Support: During functional activity;Bilateral upper extremity supported Dynamic Standing - Level of Assistance: 4: Min assist Extremity Assessment  RUE Assessment RUE Assessment: Within Functional Limits LUE Assessment LUE Assessment: Within Functional Limits RLE Assessment RLE Assessment: Within Functional Limits LLE Assessment LLE Assessment: Exceptions to [facility] LLE Strength LLE Overall Strength: Deficits;Due to pain LLE Overall Strength Comments: hip flexion and abduction WFL, other NT due to pain   See Function Navigator for Current Functional Status.   Refer to Care Plan for Long Term Goals  Recommendations for other services: None  Discharge Criteria: Patient will be discharged from PT if patient refuses treatment 3 consecutive times without medical reason, if treatment goals not met, if there is a change in medical status, if patient makes no progress towards goals or if patient is discharged from hospital.  The above assessment, treatment plan, treatment alternatives and goals were discussed and mutually agreed upon: by patient  Laretta Alstrom 01/18/2016, 9:40 AM

## 2016-01-18 NOTE — Progress Notes (Addendum)
75 y.o.malewith history of CAD, GERD, A fib, cough, OA,  PVD with  claudication of the LLE affecting QOL. He was admitted on 01/01/16 for  left femoral endarterectomy and left femoral to above knee popliteal bypass by Dr. Oneida Alar. Post op developed ischemia due to occlusion of graft and was taken back to OR that afternoon and repeat procedure on 08/07 for thrombectomy. He continued to have severe progressive ischemia and AKA recommended due to lack of options. On 08/08 patient underwent L-AKA without complications. Post op had confusion as well as abdominal distension due to ileus and large R- tension PTX noted on 08/9. Chest tube placed at bedside and NGT placed for decompression of abdomen per Dr. Hulen Skains. He developed hypoxia with N/V and concerns of aspiration PNA with RML consolidation and was started on IV antibiotics. PCCM consulted and he was treated with IV lasix for mild pulmonary edema and continued on  IV heparin. He had progressive dyspnea requiring NRB and was transferred to ICU on 8/10. CTA chest was positive for saddle PE, Left PE with large clot burden and right heart strain  Subjective/Complaints: Pt without new issues, pain is mild/mod but getting ready for PT  ROS- had 3 loose stools yest,no CP/SOB, -N/V/D  Objective: Vital Signs: Blood pressure 131/69, pulse 79, temperature 98.8 F (37.1 C), temperature source Oral, resp. rate 16, height 5' 8"  (1.727 m), weight 86.2 kg (190 lb), SpO2 100 %. Dg Abd Portable 1v  Result Date: 01/17/2016 CLINICAL DATA:  Follow-up ileus EXAM: PORTABLE ABDOMEN - 1 VIEW COMPARISON:  01/10/2016 FINDINGS: There is significant gaseous distension of the stomach with progression from prior exam highly suspicious for gastroparesis or ileus. Persistent diffuse colonic distension consistent with colonic ileus or partial colonic obstruction. IMPRESSION: There is significant gaseous distension of the stomach with progression from prior exam highly suspicious for  gastroparesis or ileus. Persistent diffuse colonic distension consistent with colonic ileus or partial colonic obstruction. Electronically Signed   By: Lahoma Crocker M.D.   On: 01/17/2016 18:58   Results for orders placed or performed during the hospital encounter of 01/17/16 (from the past 72 hour(s))  Basic metabolic panel     Status: Abnormal   Collection Time: 01/17/16  4:28 PM  Result Value Ref Range   Sodium 131 (L) 135 - 145 mmol/L   Potassium 2.9 (L) 3.5 - 5.1 mmol/L   Chloride 100 (L) 101 - 111 mmol/L   CO2 24 22 - 32 mmol/L   Glucose, Bld 228 (H) 65 - 99 mg/dL   BUN 12 6 - 20 mg/dL   Creatinine, Ser 0.93 0.61 - 1.24 mg/dL   Calcium 7.7 (L) 8.9 - 10.3 mg/dL   GFR calc non Af Amer >60 >60 mL/min   GFR calc Af Amer >60 >60 mL/min    Comment: (NOTE) The eGFR has been calculated using the CKD EPI equation. This calculation has not been validated in all clinical situations. eGFR's persistently <60 mL/min signify possible Chronic Kidney Disease.    Anion gap 7 5 - 15  Glucose, capillary     Status: Abnormal   Collection Time: 01/17/16  4:31 PM  Result Value Ref Range   Glucose-Capillary 225 (H) 65 - 99 mg/dL  Glucose, capillary     Status: Abnormal   Collection Time: 01/17/16  9:03 PM  Result Value Ref Range   Glucose-Capillary 148 (H) 65 - 99 mg/dL  C difficile quick scan w PCR reflex     Status: None  Collection Time: 01/18/16  3:00 AM  Result Value Ref Range   C Diff antigen NEGATIVE NEGATIVE   C Diff toxin NEGATIVE NEGATIVE   C Diff interpretation No C. difficile detected.   CBC WITH DIFFERENTIAL     Status: Abnormal   Collection Time: 01/18/16  4:50 AM  Result Value Ref Range   WBC 6.8 4.0 - 10.5 K/uL   RBC 3.08 (L) 4.22 - 5.81 MIL/uL   Hemoglobin 9.1 (L) 13.0 - 17.0 g/dL   HCT 27.0 (L) 39.0 - 52.0 %   MCV 87.7 78.0 - 100.0 fL   MCH 29.5 26.0 - 34.0 pg   MCHC 33.7 30.0 - 36.0 g/dL   RDW 14.7 11.5 - 15.5 %   Platelets 188 150 - 400 K/uL   Neutrophils Relative %  78 %   Neutro Abs 5.4 1.7 - 7.7 K/uL   Lymphocytes Relative 11 %   Lymphs Abs 0.7 0.7 - 4.0 K/uL   Monocytes Relative 9 %   Monocytes Absolute 0.6 0.1 - 1.0 K/uL   Eosinophils Relative 2 %   Eosinophils Absolute 0.1 0.0 - 0.7 K/uL   Basophils Relative 0 %   Basophils Absolute 0.0 0.0 - 0.1 K/uL  Comprehensive metabolic panel     Status: Abnormal   Collection Time: 01/18/16  4:50 AM  Result Value Ref Range   Sodium 133 (L) 135 - 145 mmol/L   Potassium 3.1 (L) 3.5 - 5.1 mmol/L   Chloride 101 101 - 111 mmol/L   CO2 24 22 - 32 mmol/L   Glucose, Bld 125 (H) 65 - 99 mg/dL   BUN 9 6 - 20 mg/dL   Creatinine, Ser 0.84 0.61 - 1.24 mg/dL   Calcium 7.8 (L) 8.9 - 10.3 mg/dL   Total Protein 4.8 (L) 6.5 - 8.1 g/dL   Albumin 2.0 (L) 3.5 - 5.0 g/dL   AST 19 15 - 41 U/L   ALT 20 17 - 63 U/L   Alkaline Phosphatase 114 38 - 126 U/L   Total Bilirubin 0.4 0.3 - 1.2 mg/dL   GFR calc non Af Amer >60 >60 mL/min   GFR calc Af Amer >60 >60 mL/min    Comment: (NOTE) The eGFR has been calculated using the CKD EPI equation. This calculation has not been validated in all clinical situations. eGFR's persistently <60 mL/min signify possible Chronic Kidney Disease.    Anion gap 8 5 - 15  Glucose, capillary     Status: Abnormal   Collection Time: 01/18/16  6:42 AM  Result Value Ref Range   Glucose-Capillary 115 (H) 65 - 99 mg/dL     HEENT: normal Cardio: RRR and no murmur Resp: CTA B/L and unlabored GI: BS positive and NT, ND, hi pitched bowel sounds Extremity:  Pulses positive and No Edema Skin:   Other LLE AKA with ACE Neuro: Alert/Oriented and Abnormal Motor 5/5 in BUE and RLE, 4- L HF, Hip add/abd Musc/Skel:  Normal Gen NAD   Assessment/Plan: 1. 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 Physiatrist is providing close team supervision and 24 hour management of active medical problems listed  below. Physiatrist and rehab team continue to assess barriers to discharge/monitor patient progress toward functional and medical goals. FIM:  Medical Problem List and Plan: 1.  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  A Fib/Saddle PE/Anticoagulation: Pharmaceutical: Pradexa 3. Pain Management: Neuropathy resolving and pain controlled with prn medications.  4. Mood: LCSW to follow for evaluation and support.  5. Neuropsych: This patient is capable of making decisions on his own behalf. 6. Skin/Wound Care: Routine pressure relief measures. Monitor wound for healing.  7. Fluids/Electrolytes/Nutrition: Strict I/O. Will check lytes closely -recheck BMET this evening.  8. CAD: Off imdur and BB. Imdur discontinued   9.  Acute PE with acute on chronic SOB: 10. HTN:  Bp medications discontinue due to hypotension--will continue to monitor BP bid for stability and resume slowly as tolerated.  11. Acute renal failure: Resolved with hydration and medication changes--Likely due to hypoperfusion with BP 80-90s for 48 hours. Will monitor with serial checks.  12. Hyponatremia: Improving--continue to monitor for now.  13. Fluid overload: Check weight daily and watch for other signs of overload. 14. Reactive leucocytosis: Resolving. Monitor for signs of infection.  16. Post op ileus: Resolving but need to keep K+ >4.0 to prevent recurrence.                        -abdomen still slightly distended                       -pt reports that his appetite is returning  16.  Hypokalemia: continues to be an issues --Encourage adequate intake to help manage electrolyte abnormality. Will increase supplement  17. ABLA: Monitor H/H and for any signs of bleeding with pradexa on board. Add iron supplement.  18. Prediabetes: On Carb modified diet--will monitor BS ac/hs and use SSI for elevated BS.      LOS (Days) 1 A FACE TO FACE  EVALUATION WAS PERFORMED  KIRSTEINS,ANDREW E 01/18/2016, 7:37 AM

## 2016-01-19 ENCOUNTER — Inpatient Hospital Stay (HOSPITAL_COMMUNITY): Payer: No Typology Code available for payment source

## 2016-01-19 ENCOUNTER — Inpatient Hospital Stay (HOSPITAL_COMMUNITY): Payer: No Typology Code available for payment source | Admitting: Occupational Therapy

## 2016-01-19 LAB — BASIC METABOLIC PANEL
ANION GAP: 5 (ref 5–15)
BUN: 8 mg/dL (ref 6–20)
CO2: 26 mmol/L (ref 22–32)
Calcium: 8.1 mg/dL — ABNORMAL LOW (ref 8.9–10.3)
Chloride: 102 mmol/L (ref 101–111)
Creatinine, Ser: 0.87 mg/dL (ref 0.61–1.24)
GFR calc Af Amer: >60 mL/min (ref >60)
Glucose, Bld: 153 mg/dL — ABNORMAL HIGH (ref 65–99)
POTASSIUM: 3.9 mmol/L (ref 3.5–5.1)
SODIUM: 133 mmol/L — AB (ref 135–145)

## 2016-01-19 LAB — GLUCOSE, CAPILLARY
GLUCOSE-CAPILLARY: 140 mg/dL — AB (ref 65–99)
GLUCOSE-CAPILLARY: 168 mg/dL — AB (ref 65–99)
Glucose-Capillary: 225 mg/dL — ABNORMAL HIGH (ref 65–99)
Glucose-Capillary: 139 mg/dL — ABNORMAL HIGH (ref 65–99)

## 2016-01-19 MED ORDER — POTASSIUM CHLORIDE 10 MEQ/100ML IV SOLN
10.0000 meq | INTRAVENOUS | Status: AC
Start: 2016-01-19 — End: 2016-01-19
  Administered 2016-01-19: 10 meq via INTRAVENOUS
  Filled 2016-01-19 (×2): qty 100

## 2016-01-19 MED ORDER — POTASSIUM CHLORIDE 10 MEQ/100ML IV SOLN
10.0000 meq | Freq: Once | INTRAVENOUS | Status: AC
  Administered 2016-01-19: 10 meq via INTRAVENOUS
  Filled 2016-01-19: qty 100

## 2016-01-19 MED ORDER — SODIUM CHLORIDE 0.45 % IV SOLN
INTRAVENOUS | Status: DC
  Administered 2016-01-19: 1 mL via INTRAVENOUS
  Administered 2016-01-20: 04:00:00 via INTRAVENOUS

## 2016-01-19 MED ORDER — TRAMADOL HCL 50 MG PO TABS
50.0000 mg | ORAL_TABLET | Freq: Four times a day (QID) | ORAL | Status: DC
Start: 2016-01-19 — End: 2016-01-23
  Administered 2016-01-19 – 2016-01-23 (×11): 50 mg via ORAL
  Filled 2016-01-19 (×16): qty 1

## 2016-01-19 MED ORDER — OXYCODONE-ACETAMINOPHEN 5-325 MG PO TABS: 1.0000 | ORAL_TABLET | ORAL | Status: DC | PRN

## 2016-01-19 NOTE — Progress Notes (Signed)
Subjective/Complaints: Pt without new issues, pain is mild/mod but getting ready for PT  ROS- had 3 loose stools yest,no CP/SOB, -N/V/D  Objective: Vital Signs: Blood pressure 135/70, pulse 81, temperature 98.4 F (36.9 C), temperature source Oral, resp. rate 18, height 5' 8"  (1.727 m), weight 86.2 kg (190 lb), SpO2 99 %. Dg Abd Portable 1v  Result Date: 01/17/2016 CLINICAL DATA:  Follow-up ileus EXAM: PORTABLE ABDOMEN - 1 VIEW COMPARISON:  01/10/2016 FINDINGS: There is significant gaseous distension of the stomach with progression from prior exam highly suspicious for gastroparesis or ileus. Persistent diffuse colonic distension consistent with colonic ileus or partial colonic obstruction. IMPRESSION: There is significant gaseous distension of the stomach with progression from prior exam highly suspicious for gastroparesis or ileus. Persistent diffuse colonic distension consistent with colonic ileus or partial colonic obstruction. Electronically Signed   By: Lahoma Crocker M.D.   On: 01/17/2016 18:58   Results for orders placed or performed during the hospital encounter of 01/17/16 (from the past 72 hour(s))  Basic metabolic panel     Status: Abnormal   Collection Time: 01/17/16  4:28 PM  Result Value Ref Range   Sodium 131 (L) 135 - 145 mmol/L   Potassium 2.9 (L) 3.5 - 5.1 mmol/L   Chloride 100 (L) 101 - 111 mmol/L   CO2 24 22 - 32 mmol/L   Glucose, Bld 228 (H) 65 - 99 mg/dL   BUN 12 6 - 20 mg/dL   Creatinine, Ser 0.93 0.61 - 1.24 mg/dL   Calcium 7.7 (L) 8.9 - 10.3 mg/dL   GFR calc non Af Amer >60 >60 mL/min   GFR calc Af Amer >60 >60 mL/min    Comment: (NOTE) The eGFR has been calculated using the CKD EPI equation. This calculation has not been validated in all clinical situations. eGFR's persistently <60 mL/min signify possible Chronic Kidney Disease.    Anion gap 7 5 - 15  Glucose, capillary     Status: Abnormal   Collection Time: 01/17/16  4:31 PM  Result Value Ref Range    Glucose-Capillary 225 (H) 65 - 99 mg/dL  Glucose, capillary     Status: Abnormal   Collection Time: 01/17/16  9:03 PM  Result Value Ref Range   Glucose-Capillary 148 (H) 65 - 99 mg/dL  C difficile quick scan w PCR reflex     Status: None   Collection Time: 01/18/16  3:00 AM  Result Value Ref Range   C Diff antigen NEGATIVE NEGATIVE   C Diff toxin NEGATIVE NEGATIVE   C Diff interpretation No C. difficile detected.   CBC WITH DIFFERENTIAL     Status: Abnormal   Collection Time: 01/18/16  4:50 AM  Result Value Ref Range   WBC 6.8 4.0 - 10.5 K/uL   RBC 3.08 (L) 4.22 - 5.81 MIL/uL   Hemoglobin 9.1 (L) 13.0 - 17.0 g/dL   HCT 27.0 (L) 39.0 - 52.0 %   MCV 87.7 78.0 - 100.0 fL   MCH 29.5 26.0 - 34.0 pg   MCHC 33.7 30.0 - 36.0 g/dL   RDW 14.7 11.5 - 15.5 %   Platelets 188 150 - 400 K/uL   Neutrophils Relative % 78 %   Neutro Abs 5.4 1.7 - 7.7 K/uL   Lymphocytes Relative 11 %   Lymphs Abs 0.7 0.7 - 4.0 K/uL   Monocytes Relative 9 %   Monocytes Absolute 0.6 0.1 - 1.0 K/uL   Eosinophils Relative 2 %   Eosinophils Absolute 0.1  0.0 - 0.7 K/uL   Basophils Relative 0 %   Basophils Absolute 0.0 0.0 - 0.1 K/uL  Comprehensive metabolic panel     Status: Abnormal   Collection Time: 01/18/16  4:50 AM  Result Value Ref Range   Sodium 133 (L) 135 - 145 mmol/L   Potassium 3.1 (L) 3.5 - 5.1 mmol/L   Chloride 101 101 - 111 mmol/L   CO2 24 22 - 32 mmol/L   Glucose, Bld 125 (H) 65 - 99 mg/dL   BUN 9 6 - 20 mg/dL   Creatinine, Ser 0.84 0.61 - 1.24 mg/dL   Calcium 7.8 (L) 8.9 - 10.3 mg/dL   Total Protein 4.8 (L) 6.5 - 8.1 g/dL   Albumin 2.0 (L) 3.5 - 5.0 g/dL   AST 19 15 - 41 U/L   ALT 20 17 - 63 U/L   Alkaline Phosphatase 114 38 - 126 U/L   Total Bilirubin 0.4 0.3 - 1.2 mg/dL   GFR calc non Af Amer >60 >60 mL/min   GFR calc Af Amer >60 >60 mL/min    Comment: (NOTE) The eGFR has been calculated using the CKD EPI equation. This calculation has not been validated in all clinical  situations. eGFR's persistently <60 mL/min signify possible Chronic Kidney Disease.    Anion gap 8 5 - 15  Glucose, capillary     Status: Abnormal   Collection Time: 01/18/16  6:42 AM  Result Value Ref Range   Glucose-Capillary 115 (H) 65 - 99 mg/dL  Glucose, capillary     Status: Abnormal   Collection Time: 01/18/16 12:20 PM  Result Value Ref Range   Glucose-Capillary 163 (H) 65 - 99 mg/dL  Glucose, capillary     Status: Abnormal   Collection Time: 01/18/16  4:43 PM  Result Value Ref Range   Glucose-Capillary 222 (H) 65 - 99 mg/dL  Glucose, capillary     Status: Abnormal   Collection Time: 01/18/16  8:45 PM  Result Value Ref Range   Glucose-Capillary 176 (H) 65 - 99 mg/dL  Glucose, capillary     Status: Abnormal   Collection Time: 01/19/16  5:54 AM  Result Value Ref Range   Glucose-Capillary 140 (H) 65 - 99 mg/dL     HEENT: normal Cardio: RRR and no murmur Resp: CTA B/L and unlabored GI: BS positive and NT, ND, hi pitched bowel sounds Extremity:  Pulses positive and No Edema Skin:   Left AKA incision CDI, medial thigh incision (from re vasculariztion surgery, sm amt serosang drainage Neuro: Alert/Oriented and Abnormal Motor 5/5 in BUE and RLE, 4- L HF, Hip add/abd Musc/Skel:  Normal Gen NAD   Assessment/Plan: 1. 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 Physiatrist is providing close team supervision and 24 hour management of active medical problems listed below. Physiatrist and rehab team continue to assess barriers to discharge/monitor patient progress toward functional and medical goals. FIM:       Function - Toileting Toileting steps completed by patient: Performs perineal hygiene (no pants) Toileting Assistive Devices: Grab bar or rail Assist level: Touching or steadying assistance (Pt.75%)  Function - Air cabin crew transfer assistive device:  Grab bar, Walker Assist level to toilet: Touching or steadying assistance (Pt > 75%) Assist level from toilet: Touching or steadying assistance (Pt > 75%)  Function - Chair/bed transfer Chair/bed transfer method: Squat pivot Chair/bed transfer assist level: Touching or steadying assistance (Pt >  75%) Chair/bed transfer assistive device: Armrests  Function - Locomotion: Wheelchair Will patient use wheelchair at discharge?: Yes Type: Manual Max wheelchair distance: 150 ft Assist Level: Supervision or verbal cues Assist Level: Supervision or verbal cues Assist Level: Supervision or verbal cues Turns around,maneuvers to table,bed, and toilet,negotiates 3% grade,maneuvers on rugs and over doorsills: Yes Function - Locomotion: Ambulation Assistive device: Walker-rolling Max distance: 10 ft Assist level: Touching or steadying assistance (Pt > 75%) Assist level: Touching or steadying assistance (Pt > 75%) Walk 50 feet with 2 turns activity did not occur: Safety/medical concerns Walk 150 feet activity did not occur: Safety/medical concerns Walk 10 feet on uneven surfaces activity did not occur: Safety/medical concerns  Function - Comprehension Comprehension: Auditory Comprehension assist level: Follows complex conversation/direction with no assist  Function - Expression Expression: Verbal Expression assist level: Expresses complex ideas: With no assist  Function - Social Interaction Social Interaction assist level: Interacts appropriately with others - No medications needed.  Function - Problem Solving Problem solving assist level: Solves complex problems: Recognizes & self-corrects  Function - Memory Memory assist level: Complete Independence: No helper Patient normally able to recall (first 3 days only): Current season, Location of own room, Staff names and faces, That he or she is in a hospital  Medical Problem List and Plan: 1.  Functional and mobility deficits secondary to  left above knee amputation- cont CIR PT, OT 2.  A Fib/Saddle PE/Anticoagulation: Pharmaceutical: Pradexa 3. Pain Management: Neuropathy resolving and pain controlled with prn medications.  4. Mood: LCSW to follow for evaluation and support.  5. Neuropsych: This patient is capable of making decisions on his own behalf. 6. Skin/Wound Care: Routine pressure relief measures. AKA incision fine, sm amt serosang with the fem pop incision, cont dressing changes 7. Fluids/Electrolytes/Nutrition: Strict I/O. Will check lytes closely -8. CAD: Off imdur and BB. Imdur discontinued   9.  Acute PE with acute on chronic SOB: 10. HTN:  Bp medications discontinue due to hypotension--will continue to monitor BP bid for stability and resume slowly as tolerated.  11. Acute renal failure: Resolved with hydration and medication changes--Likely due to hypoperfusion with BP 80-90s for 48 hours. Will monitor with serial checks.  12. Hyponatremia: Improving--continue to monitor for now.  13. Fluid overload: Check weight daily and watch for other signs of overload. 14. Reactive leucocytosis: Resolving. Monitor for signs of infection.  52. Post op ileus: Resolving but need to keep K+ >4.0 to prevent recurrence.                        -abdomen still slightly distended                       -pt reports that his appetite is returning  16.  Hypokalemia: continues to be an issues --Encourage adequate intake to help manage electrolyte abnormality. Will increase supplement - recheck lytes today 17. ABLA: Monitor H/H and for any signs of bleeding with pradexa on board. Add iron supplement.  18. Prediabetes: On Carb modified diet--will monitor BS ac/hs and use SSI for elevated BS.   19. Hypoalbuminemia- start prostat  LOS (Days) 2 A FACE TO FACE EVALUATION WAS PERFORMED  KIRSTEINS,ANDREW E 01/19/2016, 7:31 AM

## 2016-01-19 NOTE — IPOC Note (Addendum)
Overall Plan of Care [facility]) Patient Details Name: Raymond Jacobson MRN: [id number] DOB: 1941/02/22  Admitting Diagnosis: fl BKA  Hospital Problems: Principal Problem:   Status post above knee amputation of left lower extremity (HCC) Active Problems:   PAD (peripheral artery disease) (HCC)   Abdominal distension   Acute blood loss anemia   Diastolic dysfunction     Functional Problem List: Nursing Bladder, Bowel, Endurance, Motor, Nutrition, Pain, Safety, Skin Integrity  PT Balance, Edema, Endurance, Motor, Nutrition, Pain, Perception, Skin Integrity  OT Balance, Endurance, Pain, Safety  SLP    TR         Basic ADL's: OT Grooming, Bathing, Dressing, Toileting     Advanced  ADL's: OT       Transfers: PT Bed Mobility, Bed to Chair, Car, Manufacturing systems engineer, Metallurgist: PT Ambulation, Emergency planning/management officer, Stairs     Additional Impairments: OT    SLP        TR      Anticipated Outcomes Item Anticipated Outcome  Self Feeding Mod I  Swallowing      Basic self-care  Supervision  Toileting  Mod I   Bathroom Transfers Supervision  Bowel/Bladder  continent to bowel and bladder with mod. assisst.  Transfers  mod I  Locomotion  supervision household ambulator  Communication     Cognition     Pain  Less than 3,on 1 to 10 scale.  Safety/Judgment  Free from fall during his stay in rehab.   Therapy Plan: PT Intensity: Minimum of 1-2 x/day ,45 to 90 minutes PT Frequency: 5 out of 7 days PT Duration Estimated Length of Stay: 7-10 days OT Intensity: Minimum of 1-2 x/day, 45 to 90 minutes OT Frequency: 5 out of 7 days OT Duration/Estimated Length of Stay: 7-10 days         Team Interventions: Nursing Interventions Patient/Family Education, Bladder Management, Bowel Management, Pain Management, Skin Care/Wound Management, Discharge Planning  PT interventions Ambulation/gait training, Balance/vestibular training, Community reintegration, Discharge  planning, Disease management/prevention, DME/adaptive equipment instruction, Functional mobility training, Neuromuscular re-education, Pain management, Patient/family education, Psychosocial support, Skin care/wound management, Stair training, Therapeutic Activities, Therapeutic Exercise, UE/LE Strength taining/ROM, UE/LE Coordination activities, Wheelchair propulsion/positioning  OT Interventions Discharge planning, DME/adaptive equipment instruction, Functional mobility training, Pain management, Patient/family education, Self Care/advanced ADL retraining, Therapeutic Activities, Therapeutic Exercise  SLP Interventions    TR Interventions    SW/CM Interventions  Psychosocial Assessment, Pt/Family Education & Discharge Planning    Team Discharge Planning: Destination: PT-Home ,OT- Home (Girlfriend's home 2-3 weeks) , SLP-  Projected Follow-up: PT-Outpatient PT, OT-  None, SLP-  Projected Equipment Needs: PT-Wheelchair (measurements), Wheelchair cushion (measurements), Rolling walker with 5" wheels, Other (comment), OT-  , SLP-  Equipment Details: PT- , OT-Daughter purchased BSC and Tub bench (or shower chair?). Patient/family involved in discharge planning: PT- Patient,  OT-Patient, SLP-   MD ELOS: 8-12d Medical Rehab Prognosis:  Good Assessment: 75 y.o.malewith history of CAD, GERD, A fib, cough, OA,  PVD with  claudication of the LLE affecting QOL. He was admitted on 01/01/16 for  left femoral endarterectomy and left femoral to above knee popliteal bypass by Dr. Oneida Alar. Post op developed ischemia due to occlusion of graft and was taken back to OR that afternoon and repeat procedure on 08/07 for thrombectomy. He continued to have severe progressive ischemia and AKA recommended due to lack of options. On 08/08 patient underwent L-AKA without complications. Post op had confusion as well as  abdominal distension due to ileus and large R- tension PTX noted on 08/9. Chest tube placed at bedside and  NGT placed for decompression of abdomen per Dr. Hulen Skains. He developed hypoxia with N/V and concerns of aspiration PNA with RML consolidation and was started on IV antibiotics. PCCM consulted and he was treated with IV lasix for mild pulmonary edema and continued on  IV heparin. He had progressive dyspnea requiring NRB and was transferred to ICU on 8/10. CTA chest was positive for saddle PE, Left PE with large clot burden and right heart strain. EKOS held off due to risk of bleeding complication. BLE dopplers negative for DVT.  Vanc/Zosyn discontinued on 08/11 and chest tube removed on 08/13.  Abdominal function improving and NGT discontinued. Diet advanced to regular and patient continues to have loose stools. He developed hypotension with acute renal failure with rise in BUN/Cr-  34/1.61.  He was started on IVF and duretics/ACE discontinued 8/16 with improvement. Dr. Oneida Alar recommends holding meds till SBP> 160    Now requiring 24/7 Rehab RN,MD, as well as CIR level PT, OT and SLP.  SLP will focus on residual cognitive deficits. Given that he had the pulmonary emboli, I anticipate that his endurance will be reduced compared to usual AKA Treatment team will focus on ADLs and mobility with goals set at See Team Conference Notes for weekly updates to the plan of care

## 2016-01-19 NOTE — Progress Notes (Signed)
Patient unable to participate in therapy due to severe sudden onset abdominal pain (10/10). Assisted patient to bathroom to attempt bowel movement. No bowel movement. Patient returned to bed to rest, given maalox/mylanta. MD made aware, orders given for x-rays, IV fluids. Patient reported belching, some relief, but ongoing abdominal pain in mid lower belly. Able to eat lunch. Will continue to monitor.

## 2016-01-19 NOTE — Progress Notes (Addendum)
Physical Therapy Note  Patient Details  Name: Riccardo Hendren MRN: [id number] Date of Birth: Oct 10, 1940 Today's Date: 01/19/2016  0930-1000, 30 min individual tx Pain:6/10 surgical pain residual limb; premedicated  Bed moibility in flat bed, no rails. W/c propulsion using bil UEs. Simulated car transfer. During seated rest, 1 x 20 R heel raises for RLE strengthening.  Pt left resting in w/c with all needs within reach. Reviewed falls precautions.   Pt on 1 L O2 via [place] during session; O2 sat 100% after car transfer.  Nathaneil Feagans 01/19/2016, 8:23 AM

## 2016-01-19 NOTE — Progress Notes (Signed)
Occupational Therapy Session Note  Patient Details  Name: Raymond Jacobson MRN: [id number] Date of Birth: 1941-05-22  Today's Date: 01/19/2016 OT Individual Time:  - 1115-1210  (55 min)       Short Term Goals: Week 1:  OT Short Term Goal 1 (Week 1): STG=LTG d/t short LOS  Skilled Therapeutic Interventions/Progress Updates:    Pt. Complained of 10/10 abdominal pain.  He stated he has gas and it will not pass. Removed nasal cadular.   Propelled wc to toilet.  Transferred with CGA assist using grab bars and 3n1.    Pt doffed pants in standing with no LOB. And grab bar; O2= 98%.    Transferred to wc-- donned pants.  Unable to have BM.  Informed RN for pt wanted something to help pass the gas.  O2= 98 % on room air.  Pt did not want to engage in UE exercises stating I shoveled 2 truck loads of soil and removed the soil and placed in raised beds and felt good about his physical condition.  Pt transferred to bed.  Left with all needs in reach.     Therapy Documentation Precautions:  Precautions Precautions: Fall Restrictions Weight Bearing Restrictions: Yes LLE Weight Bearing: Non weight bearing General:   Vital Signs: Therapy Vitals Pulse Rate: 82 BP: (!) 146/74 Pain: Pain Assessment Pain Assessment: 0-10 Pain Score:10 Pain Type: Acute pain Pain Location: Abdomen Pain Orientation: Mid;Lower Pain Descriptors / Indicators: Aching Pain Frequency: Occasional Pain Intervention(s): Medication (See eMAR) ADL: ADL ADL Comments: See Functional Assessment Tool :   Other Treatments:    See Function Navigator for Current Functional Status.   Therapy/Group: Individual Therapy  Lisa Roca 01/19/2016, 11:49 AM

## 2016-01-20 ENCOUNTER — Inpatient Hospital Stay (HOSPITAL_COMMUNITY): Payer: No Typology Code available for payment source | Admitting: Physical Therapy

## 2016-01-20 ENCOUNTER — Inpatient Hospital Stay (HOSPITAL_COMMUNITY): Payer: No Typology Code available for payment source | Admitting: Occupational Therapy

## 2016-01-20 LAB — BASIC METABOLIC PANEL
Anion gap: 4 — ABNORMAL LOW (ref 5–15)
BUN: 6 mg/dL (ref 6–20)
CHLORIDE: 100 mmol/L — AB (ref 101–111)
CO2: 26 mmol/L (ref 22–32)
CREATININE: 0.92 mg/dL (ref 0.61–1.24)
Calcium: 7.7 mg/dL — ABNORMAL LOW (ref 8.9–10.3)
GFR calc non Af Amer: >60 mL/min (ref >60)
GLUCOSE: 119 mg/dL — AB (ref 65–99)
Potassium: 3.9 mmol/L (ref 3.5–5.1)
Sodium: 130 mmol/L — ABNORMAL LOW (ref 135–145)

## 2016-01-20 LAB — GLUCOSE, CAPILLARY
GLUCOSE-CAPILLARY: 124 mg/dL — AB (ref 65–99)
Glucose-Capillary: 126 mg/dL — ABNORMAL HIGH (ref 65–99)
Glucose-Capillary: 129 mg/dL — ABNORMAL HIGH (ref 65–99)
Glucose-Capillary: 119 mg/dL — ABNORMAL HIGH (ref 65–99)

## 2016-01-20 LAB — VITAMIN D 25 HYDROXY (VIT D DEFICIENCY, FRACTURES): VIT D 25 HYDROXY: 19.3 ng/mL — AB (ref 30.0–100.0)

## 2016-01-20 MED ORDER — GUAIFENESIN-DM 100-10 MG/5ML PO SYRP: 5.0000 mL | ORAL_SOLUTION | ORAL | Status: DC | PRN

## 2016-01-20 NOTE — Progress Notes (Signed)
Physical Therapy Session Note  Patient Details  Name: Raymond Jacobson MRN: [id number] Date of Birth: 12/10/1940  Today's Date: 01/20/2016 PT Individual Time: 0900-0945 PT Individual Time Calculation (min): 45 min    Short Term Goals: Week 1:  PT Short Term Goal 1 (Week 1): Patient will perform bed mobility on flat bed with supervision. PT Short Term Goal 2 (Week 1): Patient will perform transfers with supervision. PT Short Term Goal 3 (Week 1): Patient will ambulate 25 ft with supervision.  PT Short Term Goal 4 (Week 1): Patient will negotiate up/down step using RW with min A.  PT Short Term Goal 5 (Week 1): Patient will propel wheelchair 150 ft in community environment with supervision.   Skilled Therapeutic Interventions/Progress Updates:   Pt presented in bed agreeable to therapy.  Pt able to donn pants in bed performing bridge.  Pt performed supine to sit trf with bed elevated using rocking motion. Pt encouraged to use bed rail to facilitate transfer.  Pt performed squat pivot transfer to w/c.  Pt able to place R leg rest and adjust appropriately.  Pt provided cues for applying brakes and negotiating w/c to align appropriately.  Pt propelled out of room to hallway.  Pt performed stand from w/c with CGA and cues for sequencing.  Pt ambulated with RW 25x1 and 37x1 with seated rest.  SpO2 remained between 94-97% with activity on 1L supplemental O2. Pt with noted decreased R foot clearance and shortened hop length with ambulation.  After ambulation pt propelled back to room and remained in w/c until next session with all current needs met.    Therapy Documentation Precautions:  Precautions Precautions: Fall Restrictions Weight Bearing Restrictions: Yes LLE Weight Bearing: Non weight bearing General:   Vital Signs: Therapy Vitals Temp: 97.9 F (36.6 C) Temp Source: Oral Pulse Rate: 75 Resp: 17 BP: 123/66 Patient Position (if appropriate): Sitting Oxygen Therapy SpO2: 97 % O2 Device:  Not Delivered Pain: Pain Assessment Pain Score: 0-No pain Mobility:   Locomotion :    Trunk/Postural Assessment :    Balance:   Exercises:   Other Treatments:     See Function Navigator for Current Functional Status.   Therapy/Group: Individual Therapy  Jerolene Kupfer 01/20/2016, 3:18 PM

## 2016-01-20 NOTE — Progress Notes (Signed)
Social Work Assessment and Plan Social Work Assessment and Plan  Patient Details  Name: Raymond Jacobson MRN: [id number] Date of Birth: 1940/11/28  Today's Date: 01/20/2016  Problem List:  Patient Active Problem List   Diagnosis Date Noted  . Status post above knee amputation of left lower extremity ([place]) 01/17/2016  . Abdominal distension   . Unilateral AKA ([place])   . Coronary artery disease involving native coronary artery of native heart without angina pectoris   . 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 HLD (hyperlipidemia)   . Post-operative pain   . Acute blood loss anemia   . Right lower lobe pneumonia   . Diastolic dysfunction   . Tachypnea   . Tachycardia   . Hypokalemia   . Leukocytosis   . Thrombocytopenia ([place])   . Surgery, other elective   . Pain of upper abdomen   . Pneumothorax on right   . Chest trauma   . Acute respiratory failure ([place])   . Pre-operative cardiovascular examination 12/31/2015  . PAD (peripheral artery disease) ([place]) 10/15/2015  . ACS (acute coronary syndrome) ([place]) 06/20/2013  . HTN (hypertension) 04/13/2011  . Elevated lipids 04/13/2011  . CAD S/P multiple PCIs 04/13/2011   Past Medical History:  Past Medical History:  Diagnosis Date  . Angina    last time >yr  . Arthritis    "knees, ankles" (10/15/2015)  . Basal cell carcinoma of scalp    "froze off"  . CAD (coronary artery disease)    Previous stents last 6 years ago [place]  . Complication of anesthesia   . Cough    recent bronchitis- dry cough  . Dry cough   . DVT (deep venous thrombosis) (HCC) ~ 1969   LLE  . Elevated lipids   . GERD (gastroesophageal reflux disease)    occ -tums  . High cholesterol   . History of hiatal hernia    hx  . Hypertension   . Myocardial infarction [facility]) ~ 2006   "blood work"  . Peripheral vascular disease ([place])   . PONV (postoperative nausea and vomiting)   . Shortness of breath 04/13/11   "mostly w/exertion"/ now  with bronchitis   Past Surgical History:  Past Surgical History:  Procedure Laterality Date  . AMPUTATION Left 01/07/2016   Procedure: AMPUTATION LEFT LEG  ABOVE KNEE;  Surgeon: Elam Dutch, MD;  Location: [place] [facility];  Service: Vascular;  Laterality: Left;  . CARPAL TUNNEL RELEASE Right 1990s  . CORONARY ANGIOPLASTY WITH STENT PLACEMENT  ~ 2004; ~ 2006; 04/13/11   "# of stents 1 (i2004);+2 (2006)+ 1 (04/13/11)= 4 total"  . ENDARTERECTOMY FEMORAL Left 01/01/2016   Procedure: LEFT FEMORAL ARTERY  ENDARTERECTOMY ;  Surgeon: Elam Dutch, MD;  Location: [facility];  Service: Vascular;  Laterality: Left;  . FEMORAL ARTERY STENT Left 10/15/2015  . FEMORAL-POPLITEAL BYPASS GRAFT Left 01/01/2016   Procedure: Thrombectomy of Left Leg Femoral-Popliteal Bypass Graft;  Surgeon: Elam Dutch, MD;  Location: [facility];  Service: Vascular;  Laterality: Left;  . FEMORAL-POPLITEAL BYPASS GRAFT Left 01/01/2016   Procedure: LEFT  FEMORAL-POPLITEAL ARTERY  BYPASS GRAFT ;  Surgeon: Elam Dutch, MD;  Location: [place] [facility];  Service: Vascular;  Laterality: Left;  . FEMORAL-POPLITEAL BYPASS GRAFT Left 01/06/2016   Procedure: THROMBECTOMY WITH REVISION oF dISTAL Anastomosis BYPASS GRAFT FEMORAL-POPLITEAL ARTERY.;  Surgeon: Rosetta Posner, MD;  Location: [facility];  Service: Vascular;  Laterality: Left;  . FEMORAL-POPLITEAL BYPASS GRAFT  Left 01/06/2016   Procedure: REDO FEMORAL to BELOW THE KNEE POPLITEAL ARTERY BYPASS USING 45mm PROPATEN GRAFT;  Surgeon: Elam Dutch, MD;  Location: [place];  Service: Vascular;  Laterality: Left;  . FINGER AMPUTATION  1967   "partial; right pointer"  . INTRAOPERATIVE ARTERIOGRAM Left 01/01/2016   Procedure: INTRA OPERATIVE ARTERIOGRAM;  Surgeon: Elam Dutch, MD;  Location: [place] [facility];  Service: Vascular;  Laterality: Left;  . LEFT HEART CATHETERIZATION WITH CORONARY ANGIOGRAM N/A 04/13/2011   Procedure: LEFT HEART CATHETERIZATION WITH CORONARY ANGIOGRAM;  Surgeon: Josue Hector, MD;  Location[facility] CATH  LAB;  Service: Cardiovascular;  Laterality: N/A;  . LEFT HEART CATHETERIZATION WITH CORONARY ANGIOGRAM N/A 06/20/2013   Procedure: LEFT HEART CATHETERIZATION WITH CORONARY ANGIOGRAM;  Surgeon: Leonie Man, MD;  Location: [facility] CATH LAB;  Service: Cardiovascular;  Laterality: N/A;  . PERCUTANEOUS CORONARY STENT INTERVENTION (PCI-S) Right 04/13/2011   Procedure: PERCUTANEOUS CORONARY STENT INTERVENTION (PCI-S);  Surgeon: Burnell Blanks, MD;  Location: [facility] CATH LAB;  Service: Cardiovascular;  Laterality: Right;  . PERIPHERAL VASCULAR CATHETERIZATION N/A 10/15/2015   Procedure: Abdominal Aortogram w/Lower Extremity;  Surgeon: Serafina Mitchell, MD;  Location: [facility] CV LAB;  Service: Cardiovascular;  Laterality: N/A;  . TONSILLECTOMY     "when I was a young one"  . Tricep Surgery  ~ 2007   "right arm; tore all my muscles @ my elbow"  . VEIN HARVEST Left 01/01/2016   Procedure: USING NON-REVERSE LEFT GREATER SAPHENOUS VEIN HARVEST;  Surgeon: Elam Dutch, MD;  Location: [place];  Service: Vascular;  Laterality: Left;   Social History:  reports that he quit smoking about 48 years ago. His smoking use included Cigarettes. He has a 12.00 pack-year smoking history. He has never used smokeless tobacco. He reports that he drinks alcohol. He reports that he does not use drugs.  Family / Support Systems Marital Status: Divorced Patient Roles: Partner, Parent Spouse/Significant Other: Emily-girlfriend [phone]  [phone] Children: Danielle-daughter [phone] Other Supports: Friends Anticipated Caregiver: Emily-girlfriend Ability/Limitations of Caregiver: Daughter lives in [place] went back-Emily available Caregiver Availability: 24/7 Family Dynamics: Close knit with his daughter she was here to make sure he was ok then went back to [place]. His girlfriend Raquel Sarna of fives years is supportive and involved, she will assist at discharge.  Social History Preferred language:  English Religion: Catholic Cultural Background: No issues Education: Some college Read: Yes Write: Yes Employment Status: Retired Freight forwarder Issues: No issues Guardian/Conservator: None-according to MD pt is capable of making his own decisions while here   Abuse/Neglect Physical Abuse: Denies Verbal Abuse: Denies Sexual Abuse: Denies Exploitation of patient/patient's resources: Denies Self-Neglect: Denies  Emotional Status Pt's affect, behavior adn adjustment status: Pt is motivated to recover and regain his independence. He has always been one to take care of himself and prides himself on this. He is confident he will do well it will take time and he will be patient. Recent Psychosocial Issues: other health issues but has managed them. Pyschiatric History: No history he is one that is a realist and will battle through, he always has. With his [place] background he is on the will problem solve his way out of it. He feels he is doing well and no intervention by neuro-psych is needed. Will monitor while here. Substance Abuse History: No issues  Patient / Family Perceptions, Expectations & Goals Pt/Family understanding of illness & functional limitations: Pt is able to explain his amputation and that it was  all the MD could do, everything else had been tried. He talks with the MD daily and feels his questions and concerns have been addressed. He is ready to work and get home. Premorbid pt/family roles/activities: Father, Boyfriend, Veteran, Retiree, Friend, etc Anticipated changes in roles/activities/participation: resume Pt/family expectations/goals: Pt states: " I want to take care of my own needs, I will get there."  Raquel Sarna states: " I know he will do it, he is just that stubborn."  [employer]: Other (Comment) (Connected to [place] in [place]) Premorbid Home Care/DME Agencies: None Transportation available at discharge: Raquel Sarna and  friends Resource referrals recommended: Support group (specify)  Discharge Planning Living Arrangements: Alone Support Systems: Spouse/significant other, Children, Water engineer, Social worker community Type of Residence: Private residence Insurance Resources: [employer], Multimedia programmer (specify) ([place]) Financial Resources: [place] Referred: No Living Expenses: Own Money Management: Patient Does the patient have any problems obtaining your medications?: No Home Management: patient Patient/Family Preliminary Plans: Plans to go to Emily's home until he is able to return to his home alone. She can provide assistance to pt, she plans to come in and observe pt in therapies to learn what she may need to do for him. Pt wants to go home but also wants to be ready to go  Social Work Anticipated Follow Up Needs: HH/OP  Clinical Impression Very motivated gentleman who is confident he will get to mod/i level, he realizes it recovery will take time and he will be patient. He has always been one to take care of himself. His daughter from [place] was here to make sure he was Doing ok and now has gone back. Pt feels she will be back again. His girlfriend is involved and plans for him to come home with her until he can return to his own home. Will work on a safe discharge plan and await team conf-Wed.  Elease Hashimoto 01/20/2016, 11:46 AM

## 2016-01-20 NOTE — Progress Notes (Signed)
Occupational Therapy Session Note  Patient Details  Name: Raymond Jacobson MRN: [id number] Date of Birth: 07/19/1940  Today's Date: 01/20/2016 OT Individual Time: 1003-1100 OT Individual Time Calculation (min): 57 min     Short Term Goals: Week 1:  OT Short Term Goal 1 (Week 1): STG=LTG d/t short LOS  Skilled Therapeutic Interventions/Progress Updates:    Pt worked on shaving from wheelchair position with setup.  Also integrated sit to stand and functional mobility using the RW with min assist.  He was able to ambulate approximately 35 ft before needing a rest break.  Oxygen sats 96% on room air with both intervals of mobility.  Discussed with pt at beginning of session recommended DME needs for home.  Will discuss with SW coverage secondary to pt having VA insurance as well as medicare.  Pt left in wheelchair with MD present at end of session.   Therapy Documentation Precautions:  Precautions Precautions: Fall Restrictions Weight Bearing Restrictions: No LLE Weight Bearing: Non weight bearing  Pain: Pain Assessment Pain Assessment: No/denies pain ADL: See Function Navigator for Current Functional Status.   Therapy/Group: Individual Therapy  Choua Chalker OTR/L 01/20/2016, 12:14 PM

## 2016-01-20 NOTE — Care Management Note (Signed)
Inpatient [facility] Individual Statement of Services  Patient Name:  Raymond Jacobson  Date:  01/20/2016  Welcome to the [facility].  Our goal is to provide you with an individualized program based on your diagnosis and situation, designed to meet your specific needs.  With this comprehensive rehabilitation program, you will be expected to participate in at least 3 hours of rehabilitation therapies Monday-Friday, with modified therapy programming on the weekends.  Your rehabilitation program will include the following services:  Physical Therapy (PT), Occupational Therapy (OT), 24 hour per day rehabilitation nursing, Therapeutic Recreaction (TR), Case Management (Social Worker), Rehabilitation Medicine, Nutrition Services and Pharmacy Services  Weekly team conferences will be held on Wednesday to discuss your progress.  Your Social Worker will talk with you frequently to get your input and to update you on team discussions.  Team conferences with you and your family in attendance may also be held.  Expected length of stay: 7-10 days  Overall anticipated outcome: mod/i-supervision level  Depending on your progress and recovery, your program may change. Your Social Worker will coordinate services and will keep you informed of any changes. Your Social Worker's name and contact numbers are listed  below.  The following services may also be recommended but are not provided by the [facility] will be made to provide these services after discharge if needed.  Arrangements include referral to agencies that provide these services.  Your insurance has been verified to be:  [place] Your primary doctor is:  [place] in [place]  Pertinent information will be shared with your doctor and your insurance company.  Social Worker:  Ovidio Kin, [place] or [phone]  Information discussed with and copy given to patient by: Elease Hashimoto, 01/20/2016, 11:29 AM

## 2016-01-20 NOTE — Progress Notes (Signed)
[facility]  Subjective  - feels better   Objective (!) 151/78 96 98.6 F (37 C) (Oral) 18 96%  Intake/Output Summary (Last 24 hours) at 01/20/16 1124 Last data filed at 01/20/16 0700  Gross per 24 hour  Intake              240 ml  Output              700 ml  Net             -460 ml   Abdomen less distended Left groin 1 cm area of maceration upper pole Left mid thigh similar both with some serous drainage AKA still healing  Assessment/Planning: Continuing slow improvement Continue dry dressings for groin incisions will continue to follow Ileus resolving probably narcotic and hypokalemia BP being followed by Rehab service agree with adding back meds as BP rises  Ruta Hinds 01/20/2016 11:24 AM --  Laboratory Lab Results:  Recent Labs  01/18/16 0450  WBC 6.8  HGB 9.1*  HCT 27.0*  PLT 188   BMET  Recent Labs  01/18/16 0450 01/19/16 1324  NA 133* 133*  K 3.1* 3.9  CL 101 102  CO2 24 26  GLUCOSE 125* 153*  BUN 9 8  CREATININE 0.84 0.87  CALCIUM 7.8* 8.1*    COAG Lab Results  Component Value Date   INR 1.00 01/01/2016   INR 1.72 (H) 12/24/2015   INR 1.06 10/01/2015   No results found for: PTT

## 2016-01-20 NOTE — Progress Notes (Signed)
Occupational Therapy Session Note  Patient Details  Name: Mosha Punches MRN: [id number] Date of Birth: [birth year]  Today's Date: 01/20/2016 OT Individual Time: 1401-1445 OT Individual Time Calculation (min): 44 min     Skilled Therapeutic Interventions/Progress Updates:    Pt completed LB dressing in bed with bridging to pull them up over his hips.  Completed transfer to wheelchair with use of the RW with min guard assist.  He rolled his wheelchair down to the tub/shower room where he completed tub shower transfer with min assist with use of the RW and tub bench.  Discussed use of hand held shower and shower curtain placement to keep water from running out of the shower.  Pt then worked in standing balance in the therapy gym while [place].  He tends to use momentum with sit to stand, demonstrating LOB posteriorly when he attempts to complete sit to stand slower.  Decreased endurance with pt requiring frequent rest breaks after standing for 3-4 mins.  HR remained at 95 or less with O2 sats at 93-96 % on room air.  Pt rolled himself back to the room with modified independence.  Pt left in room with call button and phone within reach.   Therapy Documentation Precautions:  Precautions Precautions: Fall Restrictions Weight Bearing Restrictions: Yes LLE Weight Bearing: Non weight bearing  Pain: Pain Assessment Pain Assessment: No/denies pain Pain Score: 0-No pain ADL: ADL ADL Comments: See Functional Assessment Tool  See Function Navigator for Current Functional Status.   Therapy/Group: Individual Therapy  Brinlynn Gorton OTR/L 01/20/2016, 4:15 PM

## 2016-01-20 NOTE — Progress Notes (Signed)
Patient information reviewed and entered into eRehab system by Marvelyn Bouchillon, RN, CRRN, PPS Coordinator.  Information including medical coding and functional independence measure will be reviewed and updated through discharge.     Per nursing patient was given "Data Collection Information Summary for Patients in Inpatient Rehabilitation Facilities with attached "Privacy Act Statement-Health Care Records" upon admission.  

## 2016-01-20 NOTE — Plan of Care (Signed)
Problem: RH BOWEL ELIMINATION Goal: RH STG MANAGE BOWEL WITH ASSISTANCE STG Manage Bowel with Min. Assistance.  Outcome: Not Progressing Watery diarrhea, using bedpan

## 2016-01-20 NOTE — Progress Notes (Signed)
Physical Therapy Session Note  Patient Details  Name: Raymond Jacobson MRN: [id number] Date of Birth: [birth year]  Today's Date: 01/20/2016 PT Individual Time: 1519-1600 PT Individual Time Calculation (min): 41 min    Short Term Goals: Week 1:  PT Short Term Goal 1 (Week 1): Patient will perform bed mobility on flat bed with supervision. PT Short Term Goal 2 (Week 1): Patient will perform transfers with supervision. PT Short Term Goal 3 (Week 1): Patient will ambulate 25 ft with supervision.  PT Short Term Goal 4 (Week 1): Patient will negotiate up/down step using RW with min A.  PT Short Term Goal 5 (Week 1): Patient will propel wheelchair 150 ft in community environment with supervision.   Skilled Therapeutic Interventions/Progress Updates:  Pt received in w/c, no c/o pain.  Pt reporting his sons are coming down from [place] to assist with building a ramp for home entry/exit and will take measurements of doorways to determine w/c accessibility.  Provided pt with two home measurement work sheets to take measurements of his girlfriend's house and his house.  Pt performed w/c mobility over level surfaces Mod I and then up/down ramp x 2 reps with min A overall.  Pt transferred to mat squat pivot where pt engaged in trunk control, sitting balance and UE strengthening and endurance training with pt performing ball taps with 6 lb weighted bar without LE supported.  Changed to standing where pt performed ball taps alternating UE support on RW with close supervision.  Also performed LLE strengthening with 12 reps each open chain hip flexion, hip extension and hip ABD.  Returned to room and pt performed squat pivot to bed and sit > supine in bed with supervision.  Pt left in bed with all items within reach.   Therapy Documentation Precautions:  Precautions Precautions: Fall Restrictions Weight Bearing Restrictions: Yes LLE Weight Bearing: Non weight bearing Vital Signs: Therapy Vitals Temp: 97.9 F (36.6  C) Temp Source: Oral Pulse Rate: 75 Resp: 17 BP: 123/66 Patient Position (if appropriate): Sitting Oxygen Therapy SpO2: 97 % O2 Device: Not Delivered Pain: Pain Assessment Pain Assessment: No/denies pain Pain Score: 0-No pain   See Function Navigator for Current Functional Status.   Therapy/Group: Individual Therapy  [name] [facility] 01/20/2016, 4:04 PM

## 2016-01-20 NOTE — Progress Notes (Signed)
Subjective/Complaints: No abd pain for ~18hours, had 3 loose stools yest  ROS- ,no CP/SOB, -N/V/D  Objective: Vital Signs: Blood pressure (!) 142/75, pulse 77, temperature 98.6 F (37 C), temperature source Oral, resp. rate 18, height 5' 8" (1.727 m), weight 86.2 kg (190 lb), SpO2 98 %. Dg Chest Port 1 View  Result Date: 01/19/2016 CLINICAL DATA:  Abdominal pain and diarrhea. EXAM: PORTABLE CHEST 1 VIEW COMPARISON:  01/13/2016 FINDINGS: Midline trachea. Cardiomegaly accentuated by AP portable technique. Atherosclerosis in the transverse aorta. Mild right hemidiaphragm elevation. No pleural effusion or pneumothorax. Low lung volumes with resultant pulmonary interstitial prominence. No congestive failure. Prominent gas-filled bowel in the upper abdomen is likely colonic. IMPRESSION: Cardiomegaly and low lung volumes, without acute disease. Aortic atherosclerosis. Electronically Signed   By: Abigail Miyamoto M.D.   On: 01/19/2016 15:17   Dg Abd Portable 1v  Result Date: 01/19/2016 CLINICAL DATA:  Generalized abdominal pain with diarrhea EXAM: PORTABLE ABDOMEN - 1 VIEW COMPARISON:  January 17, 2016 FINDINGS: Generalized bowel dilatation is again noted. No free air is evident on this supine examination. There is an apparent phlebolith in the right pelvis. IMPRESSION: Generalized bowel dilatation. Suspect ileus, although a degree of distal bowel obstruction cannot be excluded. No free air evident on this supine examination. Appearance is similar to 2 days prior. Electronically Signed   By: Lowella Grip III M.D.   On: 01/19/2016 15:18   Results for orders placed or performed during the hospital encounter of 01/17/16 (from the past 72 hour(s))  Basic metabolic panel     Status: Abnormal   Collection Time: 01/17/16  4:28 PM  Result Value Ref Range   Sodium 131 (L) 135 - 145 mmol/L   Potassium 2.9 (L) 3.5 - 5.1 mmol/L   Chloride 100 (L) 101 - 111 mmol/L   CO2 24 22 - 32 mmol/L   Glucose, Bld 228  (H) 65 - 99 mg/dL   BUN 12 6 - 20 mg/dL   Creatinine, Ser 0.93 0.61 - 1.24 mg/dL   Calcium 7.7 (L) 8.9 - 10.3 mg/dL   GFR calc non Af Amer >60 >60 mL/min   GFR calc Af Amer >60 >60 mL/min    Comment: (NOTE) The eGFR has been calculated using the CKD EPI equation. This calculation has not been validated in all clinical situations. eGFR's persistently <60 mL/min signify possible Chronic Kidney Disease.    Anion gap 7 5 - 15  Glucose, capillary     Status: Abnormal   Collection Time: 01/17/16  4:31 PM  Result Value Ref Range   Glucose-Capillary 225 (H) 65 - 99 mg/dL  Glucose, capillary     Status: Abnormal   Collection Time: 01/17/16  9:03 PM  Result Value Ref Range   Glucose-Capillary 148 (H) 65 - 99 mg/dL  C difficile quick scan w PCR reflex     Status: None   Collection Time: 01/18/16  3:00 AM  Result Value Ref Range   C Diff antigen NEGATIVE NEGATIVE   C Diff toxin NEGATIVE NEGATIVE   C Diff interpretation No C. difficile detected.   CBC WITH DIFFERENTIAL     Status: Abnormal   Collection Time: 01/18/16  4:50 AM  Result Value Ref Range   WBC 6.8 4.0 - 10.5 K/uL   RBC 3.08 (L) 4.22 - 5.81 MIL/uL   Hemoglobin 9.1 (L) 13.0 - 17.0 g/dL   HCT 27.0 (L) 39.0 - 52.0 %   MCV 87.7 78.0 - 100.0 fL  MCH 29.5 26.0 - 34.0 pg   MCHC 33.7 30.0 - 36.0 g/dL   RDW 14.7 11.5 - 15.5 %   Platelets 188 150 - 400 K/uL   Neutrophils Relative % 78 %   Neutro Abs 5.4 1.7 - 7.7 K/uL   Lymphocytes Relative 11 %   Lymphs Abs 0.7 0.7 - 4.0 K/uL   Monocytes Relative 9 %   Monocytes Absolute 0.6 0.1 - 1.0 K/uL   Eosinophils Relative 2 %   Eosinophils Absolute 0.1 0.0 - 0.7 K/uL   Basophils Relative 0 %   Basophils Absolute 0.0 0.0 - 0.1 K/uL  Comprehensive metabolic panel     Status: Abnormal   Collection Time: 01/18/16  4:50 AM  Result Value Ref Range   Sodium 133 (L) 135 - 145 mmol/L   Potassium 3.1 (L) 3.5 - 5.1 mmol/L   Chloride 101 101 - 111 mmol/L   CO2 24 22 - 32 mmol/L   Glucose, Bld  125 (H) 65 - 99 mg/dL   BUN 9 6 - 20 mg/dL   Creatinine, Ser 0.84 0.61 - 1.24 mg/dL   Calcium 7.8 (L) 8.9 - 10.3 mg/dL   Total Protein 4.8 (L) 6.5 - 8.1 g/dL   Albumin 2.0 (L) 3.5 - 5.0 g/dL   AST 19 15 - 41 U/L   ALT 20 17 - 63 U/L   Alkaline Phosphatase 114 38 - 126 U/L   Total Bilirubin 0.4 0.3 - 1.2 mg/dL   GFR calc non Af Amer >60 >60 mL/min   GFR calc Af Amer >60 >60 mL/min    Comment: (NOTE) The eGFR has been calculated using the CKD EPI equation. This calculation has not been validated in all clinical situations. eGFR's persistently <60 mL/min signify possible Chronic Kidney Disease.    Anion gap 8 5 - 15  Glucose, capillary     Status: Abnormal   Collection Time: 01/18/16  6:42 AM  Result Value Ref Range   Glucose-Capillary 115 (H) 65 - 99 mg/dL  Glucose, capillary     Status: Abnormal   Collection Time: 01/18/16 12:20 PM  Result Value Ref Range   Glucose-Capillary 163 (H) 65 - 99 mg/dL  Glucose, capillary     Status: Abnormal   Collection Time: 01/18/16  4:43 PM  Result Value Ref Range   Glucose-Capillary 222 (H) 65 - 99 mg/dL  Glucose, capillary     Status: Abnormal   Collection Time: 01/18/16  8:45 PM  Result Value Ref Range   Glucose-Capillary 176 (H) 65 - 99 mg/dL  Glucose, capillary     Status: Abnormal   Collection Time: 01/19/16  5:54 AM  Result Value Ref Range   Glucose-Capillary 140 (H) 65 - 99 mg/dL  Glucose, capillary     Status: Abnormal   Collection Time: 01/19/16 11:34 AM  Result Value Ref Range   Glucose-Capillary 168 (H) 65 - 99 mg/dL  Basic metabolic panel     Status: Abnormal   Collection Time: 01/19/16  1:24 PM  Result Value Ref Range   Sodium 133 (L) 135 - 145 mmol/L   Potassium 3.9 3.5 - 5.1 mmol/L    Comment: SLIGHT HEMOLYSIS   Chloride 102 101 - 111 mmol/L   CO2 26 22 - 32 mmol/L   Glucose, Bld 153 (H) 65 - 99 mg/dL   BUN 8 6 - 20 mg/dL   Creatinine, Ser 0.87 0.61 - 1.24 mg/dL   Calcium 8.1 (L) 8.9 - 10.3 mg/dL   GFR   calc non Af  Amer >60 >60 mL/min   GFR calc Af Amer >60 >60 mL/min    Comment: (NOTE) The eGFR has been calculated using the CKD EPI equation. This calculation has not been validated in all clinical situations. eGFR's persistently <60 mL/min signify possible Chronic Kidney Disease.    Anion gap 5 5 - 15  Glucose, capillary     Status: Abnormal   Collection Time: 01/19/16  4:38 PM  Result Value Ref Range   Glucose-Capillary 225 (H) 65 - 99 mg/dL  Glucose, capillary     Status: Abnormal   Collection Time: 01/19/16  8:40 PM  Result Value Ref Range   Glucose-Capillary 139 (H) 65 - 99 mg/dL  Glucose, capillary     Status: Abnormal   Collection Time: 01/20/16  5:47 AM  Result Value Ref Range   Glucose-Capillary 124 (H) 65 - 99 mg/dL     HEENT: normal Cardio: RRR and no murmur Resp: CTA B/L and unlabored GI: BS positive and NT, ND, hi pitched bowel sounds Extremity:  Pulses positive and No Edema Skin:   Left AKA incision CDI, medial thigh incision (from re vasculariztion surgery, sm amt serosang drainage Neuro: Alert/Oriented and Abnormal Motor 5/5 in BUE and RLE, 4- L HF, Hip add/abd Musc/Skel:  Normal Gen NAD   Assessment/Plan: 1. 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 Physiatrist is providing close team supervision and 24 hour management of active medical problems listed below. Physiatrist and rehab team continue to assess barriers to discharge/monitor patient progress toward functional and medical goals. FIM: Function - Bathing Position: Other (comment) Body parts bathed by patient: Right arm, Left arm, Chest, Abdomen, Front perineal area, Right upper leg, Left upper leg, Back Body parts bathed by helper: Right lower leg, Left lower leg Assist Level: Touching or steadying assistance(Pt > 75%)  Function- Upper Body Dressing/Undressing What is the patient wearing?: Pull over  shirt/dress Pull over shirt/dress - Perfomed by patient: Thread/unthread right sleeve, Thread/unthread left sleeve, Put head through opening, Pull shirt over trunk Function - Lower Body Dressing/Undressing What is the patient wearing?: Pants, Non-skid slipper socks Position: Wheelchair/chair at sink Pants- Performed by patient: Thread/unthread right pants leg Pants- Performed by helper: Thread/unthread left pants leg, Pull pants up/down Non-skid slipper socks- Performed by helper: Don/doff right sock Assist for lower body dressing:  (Mod assist)  Function - Toileting Toileting steps completed by patient: Performs perineal hygiene, Adjust clothing prior to toileting Toileting steps completed by helper: Adjust clothing prior to toileting, Adjust clothing after toileting, Performs perineal hygiene Toileting Assistive Devices: Grab bar or rail Assist level: Touching or steadying assistance (Pt.75%)  Function - Toilet Transfers Toilet transfer assistive device: Grab bar, Walker Assist level to toilet: Touching or steadying assistance (Pt > 75%) Assist level from toilet: Touching or steadying assistance (Pt > 75%)  Function - Chair/bed transfer Chair/bed transfer method: Squat pivot Chair/bed transfer assist level: Touching or steadying assistance (Pt > 75%) Chair/bed transfer assistive device: Armrests Chair/bed transfer details: Verbal cues for precautions/safety  Function - Locomotion: Wheelchair Will patient use wheelchair at discharge?: Yes Type: Manual Max wheelchair distance: 150 Assist Level: Supervision or verbal cues Assist Level: Supervision or verbal cues Assist Level: Supervision or verbal cues Turns around,maneuvers to table,bed, and toilet,negotiates 3% grade,maneuvers on rugs and over doorsills: No Function - Locomotion: Ambulation Assistive device: Walker-rolling Max distance: 10 ft Assist level: Touching or steadying assistance (  Pt > 75%) Assist level: Touching or  steadying assistance (Pt > 75%) Walk 50 feet with 2 turns activity did not occur: Safety/medical concerns Walk 150 feet activity did not occur: Safety/medical concerns Walk 10 feet on uneven surfaces activity did not occur: Safety/medical concerns  Function - Comprehension Comprehension: Auditory Comprehension assist level: Follows complex conversation/direction with no assist  Function - Expression Expression: Verbal Expression assist level: Expresses complex ideas: With no assist  Function - Social Interaction Social Interaction assist level: Interacts appropriately with others - No medications needed.  Function - Problem Solving Problem solving assist level: Solves complex problems: Recognizes & self-corrects  Function - Memory Memory assist level: Complete Independence: No helper Patient normally able to recall (first 3 days only): Current season, Location of own room, Staff names and faces, That he or she is in a hospital  Medical Problem List and Plan: 1.  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  A Fib/Saddle PE/Anticoagulation: Pharmaceutical: Pradexa 3. Pain Management: Neuropathy resolving and pain controlled with prn medications. D/C oxycodone due to ileus 4. Mood: LCSW to follow for evaluation and support.  5. Neuropsych: This patient is capable of making decisions on his own behalf. 6. Skin/Wound Care: Routine pressure relief measures.7. Fluids/Electrolytes/Nutrition: Strict I/O. Will check lytes closely -8. CAD: Off imdur and BB. Imdur discontinued   9.  Acute PE with acute on chronic SOB: 10. HTN:  Bp medications discontinue due to hypotension--will continue to monitor BP bid for stability and resume slowly as tolerated.  11. Acute renal failure: Resolved with hydration and medication changes--Likely due to hypoperfusion with BP 80-90s for 48 hours. Will monitor with serial checks.      12.  Hypokalemia: continues to be an issues --Encourage adequate intake to help manage electrolyte abnormality. Will increase supplement - recheck lytes today 13. ABLA: Monitor H/H and for any signs of bleeding with pradexa on board. Add iron supplement.  14. Prediabetes: On Carb modified diet--will monitor BS ac/hs and use SSI for elevated BS.   15. Hypoalbuminemia- start prostat 16.  Ileus recurrent, Has been NPO overnite with K supplementation, 3 BMs, exam is benign, appears to be resolving, advance to clear liquidstoday, and if continued improvement, full liquids tomorrow LOS (Days) 3 A FACE TO FACE EVALUATION WAS PERFORMED  KIRSTEINS,ANDREW E 01/20/2016, 7:35 AM   

## 2016-01-21 ENCOUNTER — Inpatient Hospital Stay (HOSPITAL_COMMUNITY): Payer: No Typology Code available for payment source | Admitting: Physical Therapy

## 2016-01-21 ENCOUNTER — Inpatient Hospital Stay (HOSPITAL_COMMUNITY): Payer: No Typology Code available for payment source | Admitting: *Deleted

## 2016-01-21 ENCOUNTER — Inpatient Hospital Stay (HOSPITAL_COMMUNITY): Payer: No Typology Code available for payment source | Admitting: Occupational Therapy

## 2016-01-21 LAB — GLUCOSE, CAPILLARY
GLUCOSE-CAPILLARY: 105 mg/dL — AB (ref 65–99)
GLUCOSE-CAPILLARY: 109 mg/dL — AB (ref 65–99)
Glucose-Capillary: 134 mg/dL — ABNORMAL HIGH (ref 65–99)
Glucose-Capillary: 129 mg/dL — ABNORMAL HIGH (ref 65–99)

## 2016-01-21 MED ORDER — POTASSIUM CHLORIDE CRYS ER 20 MEQ PO TBCR
30.0000 meq | EXTENDED_RELEASE_TABLET | Freq: Three times a day (TID) | ORAL | Status: DC
  Administered 2016-01-21 – 2016-01-25 (×13): 30 meq via ORAL
  Filled 2016-01-21 (×14): qty 1

## 2016-01-21 NOTE — Progress Notes (Addendum)
Physical Therapy Note  Patient Details  Name: Raymond Jacobson MRN: [id number] Date of Birth: 1940-10-18 Today's Date: 01/21/2016    Time: [id number] 76 minutes  1:1 Pt with no c/o pain. Says residual limb "burns sometimes" but not right now. Pt reminded of desensitization techniques and verbalizes understanding. W/c mobility in controlled environnment with supervision, rest breaks needed due to SOB. spO2 >94% throughout treatment on room air. Ramp negotiation in w/c with pt able to perform with supervision with good safety awareness. Gait with RW with close supervision/min guard 35', 30' with prolonged rest breaks due to SOB. Supine, sidelying and prone therex for Lt LE strengthening. Prone lying x 5 minutes for stretching Lt hip flexors. Pt not able to lay fully in prone due to breathing issues but able to almost achieve prone while supported with pillows.  Seated UE therex with 4# wt for shoulder and elbow strength and endurance. Pt requires frequent rests but was able to perform session on room air.   Time: 1430-1456 26 minutes  1:1 no c/o pain. UBE level 2 4 min fwd/4 min bkwd.  W/c push ups for UE strengthening 3 x 10. Pt requires longer rest breaks due to SOB but spO2 >94% on room air. Shakaya Bhullar 01/21/2016, 11:02 AM

## 2016-01-21 NOTE — Progress Notes (Signed)
Occupational Therapy Session Note  Patient Details  Name: Raymond Jacobson MRN: [id number] Date of Birth: [birth year]  Today's Date: 01/21/2016 OT Individual Time: [id number] OT Individual Time Calculation (min): 34 min     Skilled Therapeutic Interventions/Progress Updates:    Pt worked on bed transfers in the [place] apartment with close supervision.  He utilized RW to ambulate from his wheelchair at the side of the couch to the bed.  Practice transfer to both sides with pt stating his sons are going to move his bed over so he can access the right side.  Worked on functional mobility and stepping over 4" vertical obstacle X 4 with min steady assist.  Pt also completed toilet transfer to the elevated toilet seat with supervision but demonstrated significant LOB when attempting to stand without UE support and pull his pants over his hips.  Pt's right knee gave out and he needed max assist to keep from falling down to the floor.  When utilizing one UE at a time he was able to pull his pants up with close supervision.  Pt returned to the wheelchair and wheeled himself back to the room with squat pivot transfer to the bed.  NT in room at end of session.  Pt left with call button and phone in reach.   Therapy Documentation Precautions:  Precautions Precautions: Fall Restrictions Weight Bearing Restrictions: No LLE Weight Bearing: Non weight bearing  Pain: Pain Assessment Pain Assessment: No/denies pain ADL: See Function Navigator for Current Functional Status.   Therapy/Group: Individual Therapy  Alyanna Stoermer OTR/L 01/21/2016, 4:03 PM

## 2016-01-21 NOTE — Progress Notes (Signed)
Subjective/Complaints: No abd pain for almost 48h, tolerated clr liquids  ROS- ,no CP/SOB, -N/V/D  Objective: Vital Signs: Blood pressure 132/72, pulse 80, temperature 98.5 F (36.9 C), temperature source Oral, resp. rate 20, height _0  (1.727 m), weight 86.2 kg (190 lb), SpO2 94 %. Dg Chest Port 1 View  Result Date: 01/19/2016 CLINICAL DATA:  Abdominal pain and diarrhea. EXAM: PORTABLE CHEST 1 VIEW COMPARISON:  01/13/2016 FINDINGS: Midline trachea. Cardiomegaly accentuated by AP portable technique. Atherosclerosis in the transverse aorta. Mild right hemidiaphragm elevation. No pleural effusion or pneumothorax. Low lung volumes with resultant pulmonary interstitial prominence. No congestive failure. Prominent gas-filled bowel in the upper abdomen is likely colonic. IMPRESSION: Cardiomegaly and low lung volumes, without acute disease. Aortic atherosclerosis. Electronically Signed   By: Abigail Miyamoto M.D.   On: 01/19/2016 15:17   Dg Abd Portable 1v  Result Date: 01/19/2016 CLINICAL DATA:  Generalized abdominal pain with diarrhea EXAM: PORTABLE ABDOMEN - 1 VIEW COMPARISON:  January 17, 2016 FINDINGS: Generalized bowel dilatation is again noted. No free air is evident on this supine examination. There is an apparent phlebolith in the right pelvis. IMPRESSION: Generalized bowel dilatation. Suspect ileus, although a degree of distal bowel obstruction cannot be excluded. No free air evident on this supine examination. Appearance is similar to 2 days prior. Electronically Signed   By: Lowella Grip III M.D.   On: 01/19/2016 15:18   Results for orders placed or performed during the hospital encounter of 01/17/16 (from the past 72 hour(s))  Glucose, capillary     Status: Abnormal   Collection Time: 01/18/16 12:20 PM  Result Value Ref Range   Glucose-Capillary 163 (H) 65 - 99 mg/dL  Glucose, capillary     Status: Abnormal   Collection Time: 01/18/16  4:43 PM  Result Value Ref Range    Glucose-Capillary 222 (H) 65 - 99 mg/dL  Glucose, capillary     Status: Abnormal   Collection Time: 01/18/16  8:45 PM  Result Value Ref Range   Glucose-Capillary 176 (H) 65 - 99 mg/dL  Glucose, capillary     Status: Abnormal   Collection Time: 01/19/16  5:54 AM  Result Value Ref Range   Glucose-Capillary 140 (H) 65 - 99 mg/dL  Glucose, capillary     Status: Abnormal   Collection Time: 01/19/16 11:34 AM  Result Value Ref Range   Glucose-Capillary 168 (H) 65 - 99 mg/dL  Basic metabolic panel     Status: Abnormal   Collection Time: 01/19/16  1:24 PM  Result Value Ref Range   Sodium 133 (L) 135 - 145 mmol/L   Potassium 3.9 3.5 - 5.1 mmol/L    Comment: SLIGHT HEMOLYSIS   Chloride 102 101 - 111 mmol/L   CO2 26 22 - 32 mmol/L   Glucose, Bld 153 (H) 65 - 99 mg/dL   BUN 8 6 - 20 mg/dL   Creatinine, Ser 0.87 0.61 - 1.24 mg/dL   Calcium 8.1 (L) 8.9 - 10.3 mg/dL   GFR calc non Af Amer >60 >60 mL/min   GFR calc Af Amer >60 >60 mL/min    Comment: (NOTE) The eGFR has been calculated using the CKD EPI equation. This calculation has not been validated in all clinical situations. eGFR's persistently <60 mL/min signify possible Chronic Kidney Disease.    Anion gap 5 5 - 15  Glucose, capillary     Status: Abnormal   Collection Time: 01/19/16  4:38 PM  Result Value Ref Range   Glucose-Capillary  225 (H) 65 - 99 mg/dL  Glucose, capillary     Status: Abnormal   Collection Time: 01/19/16  8:40 PM  Result Value Ref Range   Glucose-Capillary 139 (H) 65 - 99 mg/dL  Glucose, capillary     Status: Abnormal   Collection Time: 01/20/16  5:47 AM  Result Value Ref Range   Glucose-Capillary 124 (H) 65 - 99 mg/dL  Glucose, capillary     Status: Abnormal   Collection Time: 01/20/16 11:23 AM  Result Value Ref Range   Glucose-Capillary 126 (H) 65 - 99 mg/dL  Glucose, capillary     Status: Abnormal   Collection Time: 01/20/16  5:13 PM  Result Value Ref Range   Glucose-Capillary 129 (H) 65 - 99 mg/dL   Glucose, capillary     Status: Abnormal   Collection Time: 01/20/16  8:34 PM  Result Value Ref Range   Glucose-Capillary 119 (H) 65 - 99 mg/dL   Comment 1 Notify RN   Basic metabolic panel     Status: Abnormal   Collection Time: 01/20/16 10:04 PM  Result Value Ref Range   Sodium 130 (L) 135 - 145 mmol/L   Potassium 3.9 3.5 - 5.1 mmol/L   Chloride 100 (L) 101 - 111 mmol/L   CO2 26 22 - 32 mmol/L   Glucose, Bld 119 (H) 65 - 99 mg/dL   BUN 6 6 - 20 mg/dL   Creatinine, Ser 0.92 0.61 - 1.24 mg/dL   Calcium 7.7 (L) 8.9 - 10.3 mg/dL   GFR calc non Af Amer >60 >60 mL/min   GFR calc Af Amer >60 >60 mL/min    Comment: (NOTE) The eGFR has been calculated using the CKD EPI equation. This calculation has not been validated in all clinical situations. eGFR's persistently <60 mL/min signify possible Chronic Kidney Disease.    Anion gap 4 (L) 5 - 15  Glucose, capillary     Status: Abnormal   Collection Time: 01/21/16  6:52 AM  Result Value Ref Range   Glucose-Capillary 105 (H) 65 - 99 mg/dL     HEENT: normal Cardio: RRR and no murmur Resp: CTA B/L and unlabored GI: BS positive and NT, ND, hi pitched bowel sounds Extremity:  Pulses positive and No Edema Skin:   Left AKA incision CDI, medial thigh incision (from re vasculariztion surgery, sm amt serosang drainage Neuro: Alert/Oriented and Abnormal Motor 5/5 in BUE and RLE, 4- L HF, Hip add/abd Musc/Skel:  Normal Gen NAD   Assessment/Plan: 1. 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 Physiatrist is providing close team supervision and 24 hour management of active medical problems listed below. Physiatrist and rehab team continue to assess barriers to discharge/monitor patient progress toward functional and medical goals. FIM: Function - Bathing Position: Other (comment) Body parts bathed by patient: Right arm, Left arm, Chest,  Abdomen, Front perineal area, Right upper leg, Left upper leg, Back Body parts bathed by helper: Right lower leg, Left lower leg Assist Level: Touching or steadying assistance(Pt > 75%)  Function- Upper Body Dressing/Undressing What is the patient wearing?: Pull over shirt/dress Pull over shirt/dress - Perfomed by patient: Thread/unthread right sleeve, Thread/unthread left sleeve, Put head through opening, Pull shirt over trunk Assist Level: Supervision or verbal cues (per OT note) Function - Lower Body Dressing/Undressing What is the patient wearing?: Pants, Non-skid slipper socks Position: Wheelchair/chair at sink Pants- Performed by patient: Thread/unthread right pants leg  Pants- Performed by helper: Thread/unthread left pants leg, Pull pants up/down Non-skid slipper socks- Performed by helper: Don/doff right sock Assist for lower body dressing:  (Mod assist)  Function - Toileting Toileting activity did not occur: Refused (difficult to pass stool sitting on toilet, GI issues) Toileting steps completed by patient: Adjust clothing prior to toileting, Performs perineal hygiene, Adjust clothing after toileting Toileting steps completed by helper: Adjust clothing prior to toileting, Adjust clothing after toileting, Performs perineal hygiene Toileting Assistive Devices: Grab bar or rail Assist level: Touching or steadying assistance (Pt.75%)  Function - Air cabin crew transfer activity did not occur: Refused Toilet transfer assistive device: Elevated toilet seat/BSC over toilet Assist level to toilet: Touching or steadying assistance (Pt > 75%) Assist level from toilet: Touching or steadying assistance (Pt > 75%)  Function - Chair/bed transfer Chair/bed transfer method: Squat pivot Chair/bed transfer assist level: Supervision or verbal cues Chair/bed transfer assistive device: Armrests Chair/bed transfer details: Verbal cues for precautions/safety  Function - Locomotion:  Wheelchair Will patient use wheelchair at discharge?: Yes Type: Manual Max wheelchair distance: 150 Assist Level: No help, No cues, assistive device, takes more than reasonable amount of time Assist Level: No help, No cues, assistive device, takes more than reasonable amount of time Assist Level: No help, No cues, assistive device, takes more than reasonable amount of time Turns around,maneuvers to table,bed, and toilet,negotiates 3% grade,maneuvers on rugs and over doorsills: Yes Function - Locomotion: Ambulation Assistive device: Walker-rolling Max distance: 37 Assist level: Touching or steadying assistance (Pt > 75%) Assist level: Touching or steadying assistance (Pt > 75%) Walk 50 feet with 2 turns activity did not occur: Safety/medical concerns Assist level: Touching or steadying assistance (Pt > 75%) Walk 150 feet activity did not occur: Safety/medical concerns Walk 10 feet on uneven surfaces activity did not occur: Safety/medical concerns  Function - Comprehension Comprehension: Auditory Comprehension assist level: Follows complex conversation/direction with no assist  Function - Expression Expression: Verbal Expression assist level: Expresses complex ideas: With no assist  Function - Social Interaction Social Interaction assist level: Interacts appropriately with others - No medications needed.  Function - Problem Solving Problem solving assist level: Solves complex problems: Recognizes & self-corrects  Function - Memory Memory assist level: Complete Independence: No helper Patient normally able to recall (first 3 days only): Current season, Location of own room, Staff names and faces, That he or she is in a hospital  Medical Problem List and Plan: 1.  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  A Fib/Saddle PE/Anticoagulation: Pharmaceutical: Pradexa 3. Pain Management: Neuropathy  resolving and pain controlled with prn medications. D/C oxycodone due to ileus, tolerating therapy 4. Mood: LCSW to follow for evaluation and support.  5. Neuropsych: This patient is capable of making decisions on his own behalf. 6. Skin/Wound Care: Routine pressure relief measures.7. Fluids/Electrolytes/Nutrition: Strict I/O. Will check lytes closely -8. CAD: Off imdur and BB. Imdur discontinued    10. HTN:  Bp medications discontinue due to hypotension--will continue to monitor BP bid for stability and resume slowly as tolerated.  11. Acute renal failure: Resolved with hydration and medication changes--Likely due to hypoperfusion with BP 80-90s for 48 hours. Will monitor with serial checks.     12.  Hypokalemia: continues to be an issues --Encourage adequate intake to help manage electrolyte abnormality. Will increase supplement - recheck lytes today 13. ABLA: Monitor H/H and for any signs of bleeding with  pradexa on board. Add iron supplement.  14. Prediabetes: On Carb modified diet--will monitor BS ac/hs and use SSI for elevated BS.   15. Hypoalbuminemia- start prostat 16.  Ileus recurrent, Has been tolerating clr liquids 3 BMs, exam is benign still some hi pitched bowel sounds, appears to be resolving, advance to Full liquid stoday, and if continued improvement, soft diet tomorrow LOS (Days) 4 A FACE TO FACE EVALUATION WAS PERFORMED  Tibor Lemmons E 01/21/2016, 7:38 AM

## 2016-01-21 NOTE — Progress Notes (Signed)
Recreational Therapy Session Note  Patient Details  Name: Raymond Jacobson MRN: [id number] Date of Birth: [birth year] Today's Date: 01/21/2016   Clinical Impression: Problem List: Patient Active Problem List   Diagnosis Date Noted  . Status post above knee amputation of left lower extremity ([place]) 01/17/2016  . Abdominal distension   . Unilateral AKA ([place])   . Coronary artery disease involving native coronary artery of native heart without angina pectoris   . 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 HLD (hyperlipidemia)   . Post-operative pain   . Acute blood loss anemia   . Right lower lobe pneumonia   . Diastolic dysfunction   . Tachypnea   . Tachycardia   . Hypokalemia   . Leukocytosis   . Thrombocytopenia ([place])   . Surgery, other elective   . Pain of upper abdomen   . Pneumothorax on right   . Chest trauma   . Acute respiratory failure ([place])   . Pre-operative cardiovascular examination 12/31/2015  . PAD (peripheral artery disease) ([place]) 10/15/2015  . ACS (acute coronary syndrome) ([place]) 06/20/2013  . HTN (hypertension) 04/13/2011  . Elevated lipids 04/13/2011  . CAD S/Raymond multiple PCIs 04/13/2011    Past Medical History:      Past Medical History:  Diagnosis Date  . Angina    last time >yr  . Arthritis    "knees, ankles" (10/15/2015)  . Basal cell carcinoma of scalp    "froze off"  . CAD (coronary artery disease)    Previous stents last 6 years ago [place] CT  . Complication of anesthesia   . Cough    recent bronchitis- dry cough  . Dry cough   . DVT (deep venous thrombosis) ([facility]) ~ 1969   LLE  . Elevated lipids   . GERD (gastroesophageal reflux disease)    occ -tums  . High cholesterol   . History of hiatal hernia    hx  . Hypertension   . Myocardial infarction [facility]) ~ 2006   "blood work"  . Peripheral vascular disease ([place])   . PONV (postoperative nausea and vomiting)   . Shortness of  breath 04/13/11   "mostly Raymond/exertion"/ now with bronchitis   Past Surgical History:       Past Surgical History:  Procedure Laterality Date  . AMPUTATION Left 01/07/2016   Procedure: AMPUTATION LEFT LEG  ABOVE KNEE;  Surgeon: Raymond Dutch, MD;  Location: [facility];  Service: Vascular;  Laterality: Left;  . CARPAL TUNNEL RELEASE Right 1990s  . CORONARY ANGIOPLASTY WITH STENT PLACEMENT  ~ 2004; ~ 2006; 04/13/11   "# of stents 1 (i2004);+2 (2006)+ 1 (04/13/11)= 4 total"  . ENDARTERECTOMY FEMORAL Left 01/01/2016   Procedure: LEFT FEMORAL ARTERY  ENDARTERECTOMY ;  Surgeon: Raymond Dutch, MD;  Location: [facility];  Service: Vascular;  Laterality: Left;  . FEMORAL ARTERY STENT Left 10/15/2015  . FEMORAL-POPLITEAL BYPASS GRAFT Left 01/01/2016   Procedure: Thrombectomy of Left Leg Femoral-Popliteal Bypass Graft;  Surgeon: Raymond Dutch, MD;  Location: [facility];  Service: Vascular;  Laterality: Left;  . FEMORAL-POPLITEAL BYPASS GRAFT Left 01/01/2016   Procedure: LEFT  FEMORAL-POPLITEAL ARTERY  BYPASS GRAFT ;  Surgeon: Raymond Dutch, MD;  Location: [facility];  Service: Vascular;  Laterality: Left;  . FEMORAL-POPLITEAL BYPASS GRAFT Left 01/06/2016   Procedure: THROMBECTOMY WITH REVISION oF dISTAL Anastomosis BYPASS GRAFT FEMORAL-POPLITEAL ARTERY.;  Surgeon: Raymond Posner, MD;  Location: [facility];  Service: Vascular;  Laterality: Left;  .  FEMORAL-POPLITEAL BYPASS GRAFT Left 01/06/2016   Procedure: REDO FEMORAL to BELOW THE KNEE POPLITEAL ARTERY BYPASS USING 82m PROPATEN GRAFT;  Surgeon: Raymond Dutch MD;  [place]  Service: Vascular;  Laterality: Left;  . FINGER AMPUTATION  1967   "partial; right pointer"  . INTRAOPERATIVE ARTERIOGRAM Left 01/01/2016   Procedure: INTRA OPERATIVE ARTERIOGRAM;  Surgeon: Raymond Dutch MD;  [place]  Service: Vascular;  Laterality: Left;  . LEFT HEART CATHETERIZATION WITH CORONARY ANGIOGRAM N/A 04/13/2011   Procedure: LEFT HEART CATHETERIZATION WITH  CORONARY ANGIOGRAM;  Surgeon: PJosue Hector MD;  [facility];  Service: Cardiovascular;  Laterality: N/A;  . LEFT HEART CATHETERIZATION WITH CORONARY ANGIOGRAM N/A 06/20/2013   Procedure: LEFT HEART CATHETERIZATION WITH CORONARY ANGIOGRAM;  Surgeon: DLeonie Man MD;  [facility];  Service: Cardiovascular;  Laterality: N/A;  . PERCUTANEOUS CORONARY STENT INTERVENTION (PCI-S) Right 04/13/2011   Procedure: PERCUTANEOUS CORONARY STENT INTERVENTION (PCI-S);  Surgeon: CBurnell Blanks MD;  [facility];  Service: Cardiovascular;  Laterality: Right;  . PERIPHERAL VASCULAR CATHETERIZATION N/A 10/15/2015   Procedure: Abdominal Aortogram Raymond/Lower Extremity;  Surgeon: VSerafina Mitchell MD;  [facility];  Service: Cardiovascular;  Laterality: N/A;  . TONSILLECTOMY     "when I was a young one"  . Tricep Surgery  ~ 2007   "right arm; tore all my muscles @ my elbow"  . VEIN HARVEST Left 01/01/2016   Procedure: USING NON-REVERSE LEFT GREATER SAPHENOUS VEIN HARVEST;  Surgeon: Raymond Dutch MD;  [place]  Service: Vascular;  Laterality: Left;    Assessment & Plan Clinical Impression: JAbdoulBoutinis a 75y.o.malewith history of CAD, GERD, A fib, cough, OA, PVD with claudication of the LLE affecting QOL. He was admitted on 01/01/16 for left femoral endarterectomy and left femoral to above knee popliteal bypass by Dr. FOneida Jacobson Post op developed ischemia due to occlusion of graft and was taken back to OR that afternoon and repeat procedure on 08/07 for thrombectomy. He continued to have severe progressive ischemia and AKA recommended due to lack of options. On 08/08 patient underwent L-AKA without complications. Post op had confusion as well as abdominal distension due to ileus and large R- tension PTX noted on 08/9. Chest tube placed at bedside and NGT placed for decompression of abdomen per Dr. WHulen Jacobson He developed hypoxia with N/V and  concerns of aspiration PNA with RML consolidation and was started on IV antibiotics. PCCM consulted and he was treated with IV lasix for mild pulmonary edema and continued on IV heparin. He had progressive dyspnea requiring NRB and was transferred to ICU on 8/10. CTA chest was positive for saddle PE, Left PE with large clot burden and right heart strain. EKOS held off due to risk of bleeding complication. BLE dopplers negative for DVT.  Vanc/Zosyn discontinued on 08/11 and chest tube removed on 08/13. Abdominal function improving and NGT discontinued. Diet advanced to regular and patient continues to have loose stools. He developed hypotension with acute renal failure with rise in BUN/Cr- 34/1.61. He was started on IVF and duretics/ACE discontinued 8/16 with improvement. Dr. FOneida Alarrecommends holding meds till SBP> 160. Anxiety levels are improving but he continues to have dyspnea/hypoxia with activity. Patient transferred to CIR on 01/17/2016.   Pt presents with decreased activity tolerance, decreased functional mobility, decreased balance Limiting pt's independence with leisure/community pursuits.   Skilled Therapeutic Interventions/Progress Updates: Met with pt to discuss TR services.  Pt describes himself as extremely active PTA and plans to return to previous activities as able.  Education provided on activity analysis & potential adaptations.  Pt declined participation in community reintegration tomorrow as suggested by his PT feeling that it is too early.  Will continue to monitor pt for community reintegration during remainder of LOS.  No further TR at this time. Pain:  No Raymond/o Therapy/Group: Individual Therapy Muhammed Teutsch 01/21/2016, 2:03 PM

## 2016-01-21 NOTE — Progress Notes (Addendum)
Occupational Therapy Session Note  Patient Details  Name: Raymond Jacobson MRN: [id number] Date of Birth: Oct 15, 1940  Today's Date: 01/21/2016 OT Individual Time: 0800-0859 OT Individual Time Calculation (min): 59 min     Short Term Goals: Week 1:  OT Short Term Goal 1 (Week 1): STG=LTG d/t short LOS  Skilled Therapeutic Interventions/Progress Updates:    Shower and dressing this am.  Pt completed transfers to the toilet and the walk-in shower using the RW and min guard assist.  Bathing with overall supervision in sitting with lateral leans side to side to wash peri area.  He was able to complete dressing sit to stand at the EOB with min guard assist as well.  Educated pt on stump wrapping before donning clothing but did not attempt himself.  Still with SOB with exertion requiring rest breaks after short distance mobility or transfers.  Oxygen sats decreased to 93% on room air with HR elevated to 100 after ambulating from shower to EOB.  Pt left in bed with call button and phone in reach.   Therapy Documentation Precautions:  Precautions Precautions: Fall Restrictions Weight Bearing Restrictions: No LLE Weight Bearing: Non weight bearing  Pain: Pain Assessment Pain Assessment: Faces Pain Score: 0-No pain Faces Pain Scale: Hurts a little bit Pain Type: Surgical pain Pain Location: Leg Pain Orientation: Left Pain Descriptors / Indicators: Discomfort Pain Intervention(s): Medication (See eMAR);Repositioned ADL: See Function Navigator for Current Functional Status.   Therapy/Group: Individual Therapy  Naleyah Ohlinger OTR/L 01/21/2016, 8:59 AM

## 2016-01-22 ENCOUNTER — Inpatient Hospital Stay (HOSPITAL_COMMUNITY): Payer: No Typology Code available for payment source | Admitting: Occupational Therapy

## 2016-01-22 ENCOUNTER — Inpatient Hospital Stay (HOSPITAL_COMMUNITY): Payer: No Typology Code available for payment source

## 2016-01-22 LAB — CREATININE, SERUM
CREATININE: 0.83 mg/dL (ref 0.61–1.24)
GFR calc Af Amer: >60 mL/min (ref >60)
GFR calc non Af Amer: >60 mL/min (ref >60)

## 2016-01-22 LAB — GLUCOSE, CAPILLARY
GLUCOSE-CAPILLARY: 150 mg/dL — AB (ref 65–99)
Glucose-Capillary: 108 mg/dL — ABNORMAL HIGH (ref 65–99)
Glucose-Capillary: 151 mg/dL — ABNORMAL HIGH (ref 65–99)
Glucose-Capillary: 127 mg/dL — ABNORMAL HIGH (ref 65–99)

## 2016-01-22 NOTE — Plan of Care (Signed)
Problem: RH SKIN INTEGRITY Goal: RH STG SKIN FREE OF INFECTION/BREAKDOWN With Mod. Assist.  Outcome: Progressing BID dressing changes to L groin/thigh; daily dressing change to L AKA

## 2016-01-22 NOTE — Progress Notes (Signed)
Occupational Therapy Session Note  Patient Details  Name: Raymond Jacobson MRN: [id number] Date of Birth: [birth year]  Today's Date: 01/22/2016 OT Individual Time: [id number] OT Individual Time Calculation (min): 34 min      Skilled Therapeutic Interventions/Progress Updates:    Pt's significant other present for session.  Educated her on tub/shower transfers and DME needs.  He was able to transfer to the wheelchair with use of the RW and close supervision.  Tub/shower transfer completed with supervision to tub bench as well.  She reports already having bars to go around the toilet frame as well as a tub bench.  Also educated pt and significant other on possible need for gait belt to use for safety as well.  They both voice understanding.  Pt still with decreased endurance requiring rest break after ambulating 25-30 ft in the hallway.  Oxygen sats remained at 94% or greater on room air.  Pt left in wheelchair at end of session with girlfriend present.   Therapy Documentation Precautions:  Precautions Precautions: Fall Restrictions Weight Bearing Restrictions: No LLE Weight Bearing: Non weight bearing  Pain: Pain Assessment Pain Assessment: Faces Faces Pain Scale: Hurts a little bit Pain Type: Acute pain Pain Location: Leg Pain Orientation: Left Pain Descriptors / Indicators: Discomfort Pain Intervention(s): Repositioned;Distraction ADL: ADL ADL Comments: See Functional Assessment Tool Exercises:   Other Treatments:    See Function Navigator for Current Functional Status.   Therapy/Group: Individual Therapy  Zavia Pullen OTR/L 01/22/2016, 3:49 PM

## 2016-01-22 NOTE — Progress Notes (Signed)
Occupational Therapy Session Note  Patient Details  Name: Raymond Jacobson MRN: [id number] Date of Birth: 1941/05/16  Today's Date: 01/22/2016 OT Individual Time: [id number] OT Individual Time Calculation (min): 58 min     Short Term Goals: Week 1:  OT Short Term Goal 1 (Week 1): STG=LTG d/t short LOS  Skilled Therapeutic Interventions/Progress Updates:    Pt used the RW initially for ambulation to the bathroom to retrieve a washcloth and towel for shaving at the sink.  Supervision for mobility with use of the RW with O2 sats decreasing to 94% with HR elevated to 105.  Grooming completed with setup at the sink.  Pt transferred back to the bed from wheelchair with supervision squat pivot.  Therapist wrapped LLE for support and shaping using ace bandage.  Pt next rolled to the therapy gym for UE strengthening using the ergonometer.  Pt completed 2 sets of 4 mins with intensity at level 12 on the first set and level 15 on the second.   RPMs maintained at 24-28 per minute.  Perceived exertions at level 13-15 on the Borg perceived exertion scale.  Oxygen sats maintained greater than 94% with both sets.  Pt returned to room at end of session pushing himself in the wheelchair.  Pt transferred back to the bed with supervision to conclude session.  Call button in place.   Therapy Documentation Precautions:  Precautions Precautions: Fall Restrictions Weight Bearing Restrictions: No LLE Weight Bearing: Non weight bearing   Vital Signs: Therapy Vitals Pulse Rate: 95 Oxygen Therapy SpO2: 96 % O2 Device: Not Delivered Pulse Oximetry Type: Intermittent Pain: Pain Assessment Pain Assessment: No/denies pain ADL: See Function Navigator for Current Functional Status.   Therapy/Group: Individual Therapy  Samaj Wessells OTR/L 01/22/2016, 10:34 AM

## 2016-01-22 NOTE — Progress Notes (Signed)
Subjective/Complaints: Pt slept ok, had 2-3 BMs yest loose with much gas Reviewed KUB very little stool burden  ROS- ,no CP/SOB, -N/V/D  Objective: Vital Signs: Blood pressure (!) 147/80, pulse 84, temperature 98.3 F (36.8 C), temperature source Oral, resp. rate 20, height 5' 8" (1.727 m), weight 83.9 kg (185 lb), SpO2 95 %. No results found. Results for orders placed or performed during the hospital encounter of 01/17/16 (from the past 72 hour(s))  Glucose, capillary     Status: Abnormal   Collection Time: 01/19/16 11:34 AM  Result Value Ref Range   Glucose-Capillary 168 (H) 65 - 99 mg/dL  Basic metabolic panel     Status: Abnormal   Collection Time: 01/19/16  1:24 PM  Result Value Ref Range   Sodium 133 (L) 135 - 145 mmol/L   Potassium 3.9 3.5 - 5.1 mmol/L    Comment: SLIGHT HEMOLYSIS   Chloride 102 101 - 111 mmol/L   CO2 26 22 - 32 mmol/L   Glucose, Bld 153 (H) 65 - 99 mg/dL   BUN 8 6 - 20 mg/dL   Creatinine, Ser 0.87 0.61 - 1.24 mg/dL   Calcium 8.1 (L) 8.9 - 10.3 mg/dL   GFR calc non Af Amer >60 >60 mL/min   GFR calc Af Amer >60 >60 mL/min    Comment: (NOTE) The eGFR has been calculated using the CKD EPI equation. This calculation has not been validated in all clinical situations. eGFR's persistently <60 mL/min signify possible Chronic Kidney Disease.    Anion gap 5 5 - 15  Glucose, capillary     Status: Abnormal   Collection Time: 01/19/16  4:38 PM  Result Value Ref Range   Glucose-Capillary 225 (H) 65 - 99 mg/dL  Glucose, capillary     Status: Abnormal   Collection Time: 01/19/16  8:40 PM  Result Value Ref Range   Glucose-Capillary 139 (H) 65 - 99 mg/dL  Glucose, capillary     Status: Abnormal   Collection Time: 01/20/16  5:47 AM  Result Value Ref Range   Glucose-Capillary 124 (H) 65 - 99 mg/dL  Glucose, capillary     Status: Abnormal   Collection Time: 01/20/16 11:23 AM  Result Value Ref Range   Glucose-Capillary 126 (H) 65 - 99 mg/dL  Glucose,  capillary     Status: Abnormal   Collection Time: 01/20/16  5:13 PM  Result Value Ref Range   Glucose-Capillary 129 (H) 65 - 99 mg/dL  Glucose, capillary     Status: Abnormal   Collection Time: 01/20/16  8:34 PM  Result Value Ref Range   Glucose-Capillary 119 (H) 65 - 99 mg/dL   Comment 1 Notify RN   Basic metabolic panel     Status: Abnormal   Collection Time: 01/20/16 10:04 PM  Result Value Ref Range   Sodium 130 (L) 135 - 145 mmol/L   Potassium 3.9 3.5 - 5.1 mmol/L   Chloride 100 (L) 101 - 111 mmol/L   CO2 26 22 - 32 mmol/L   Glucose, Bld 119 (H) 65 - 99 mg/dL   BUN 6 6 - 20 mg/dL   Creatinine, Ser 0.92 0.61 - 1.24 mg/dL   Calcium 7.7 (L) 8.9 - 10.3 mg/dL   GFR calc non Af Amer >60 >60 mL/min   GFR calc Af Amer >60 >60 mL/min    Comment: (NOTE) The eGFR has been calculated using the CKD EPI equation. This calculation has not been validated in all clinical situations. eGFR's persistently <60  mL/min signify possible Chronic Kidney Disease.    Anion gap 4 (L) 5 - 15  Glucose, capillary     Status: Abnormal   Collection Time: 01/21/16  6:52 AM  Result Value Ref Range   Glucose-Capillary 105 (H) 65 - 99 mg/dL  Glucose, capillary     Status: Abnormal   Collection Time: 01/21/16 11:25 AM  Result Value Ref Range   Glucose-Capillary 134 (H) 65 - 99 mg/dL  Glucose, capillary     Status: Abnormal   Collection Time: 01/21/16  4:35 PM  Result Value Ref Range   Glucose-Capillary 129 (H) 65 - 99 mg/dL  Glucose, capillary     Status: Abnormal   Collection Time: 01/21/16  8:43 PM  Result Value Ref Range   Glucose-Capillary 109 (H) 65 - 99 mg/dL   Comment 1 Notify RN   Creatinine, serum     Status: None   Collection Time: 01/22/16  5:42 AM  Result Value Ref Range   Creatinine, Ser 0.83 0.61 - 1.24 mg/dL   GFR calc non Af Amer >60 >60 mL/min   GFR calc Af Amer >60 >60 mL/min    Comment: (NOTE) The eGFR has been calculated using the CKD EPI equation. This calculation has not been  validated in all clinical situations. eGFR's persistently <60 mL/min signify possible Chronic Kidney Disease.   Glucose, capillary     Status: Abnormal   Collection Time: 01/22/16  6:34 AM  Result Value Ref Range   Glucose-Capillary 108 (H) 65 - 99 mg/dL     HEENT: normal Cardio: RRR and no murmur Resp: CTA B/L and unlabored GI: BS positive and NT, ND, Normal bowel sounds Extremity:  Pulses positive and No Edema Skin:   Left AKA incision CDI, medial thigh incision (from re vasculariztion surgery, sm amt serosang drainage Neuro: Alert/Oriented and Abnormal Motor 5/5 in BUE and RLE, 4- L HF, Hip add/abd Musc/Skel:  Normal Gen NAD   Assessment/Plan: 1. 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 Physiatrist is providing close team supervision and 24 hour management of active medical problems listed below. Physiatrist and rehab team continue to assess barriers to discharge/monitor patient progress toward functional and medical goals. FIM: Function - Bathing Position: Other (comment) Body parts bathed by patient: Right arm, Left arm, Chest, Abdomen, Front perineal area, Right upper leg, Left upper leg, Back, Buttocks, Right lower leg Body parts bathed by helper: Right lower leg, Left lower leg Bathing not applicable: Left lower leg Assist Level: Supervision or verbal cues  Function- Upper Body Dressing/Undressing What is the patient wearing?: Pull over shirt/dress Pull over shirt/dress - Perfomed by patient: Thread/unthread right sleeve, Thread/unthread left sleeve, Put head through opening, Pull shirt over trunk Assist Level: Supervision or verbal cues (per OT note) Function - Lower Body Dressing/Undressing What is the patient wearing?: Pants, Shoes Position: Wheelchair/chair at sink Pants- Performed by patient: Thread/unthread right pants leg, Thread/unthread left pants leg, Pull  pants up/down Pants- Performed by helper: Thread/unthread left pants leg, Pull pants up/down Non-skid slipper socks- Performed by helper: Don/doff right sock Shoes - Performed by patient: Don/doff right shoe Assist for lower body dressing: Set up  Function - Toileting Toileting activity did not occur: Refused (difficult to pass stool sitting on toilet, GI issues) Toileting steps completed by patient: Adjust clothing prior to toileting, Performs perineal hygiene, Adjust clothing after toileting Toileting steps completed by helper: Adjust clothing prior  to toileting, Adjust clothing after toileting, Performs perineal hygiene Toileting Assistive Devices: Grab bar or rail Assist level: Supervision or verbal cues  Function - Air cabin crew transfer activity did not occur: Refused Toilet transfer assistive device: Elevated toilet seat/BSC over toilet Assist level to toilet: Touching or steadying assistance (Pt > 75%) Assist level from toilet: Touching or steadying assistance (Pt > 75%)  Function - Chair/bed transfer Chair/bed transfer method: Stand pivot Chair/bed transfer assist level: Touching or steadying assistance (Pt > 75%) Chair/bed transfer assistive device: Armrests, Walker Chair/bed transfer details: Verbal cues for precautions/safety  Function - Locomotion: Wheelchair Will patient use wheelchair at discharge?: Yes Type: Manual Max wheelchair distance: 150 Assist Level: No help, No cues, assistive device, takes more than reasonable amount of time Assist Level: No help, No cues, assistive device, takes more than reasonable amount of time Assist Level: No help, No cues, assistive device, takes more than reasonable amount of time Turns around,maneuvers to table,bed, and toilet,negotiates 3% grade,maneuvers on rugs and over doorsills: Yes Function - Locomotion: Ambulation Assistive device: Walker-rolling Max distance: 37 Assist level: Touching or steadying assistance (Pt  > 75%) Assist level: Touching or steadying assistance (Pt > 75%) Walk 50 feet with 2 turns activity did not occur: Safety/medical concerns Assist level: Touching or steadying assistance (Pt > 75%) Walk 150 feet activity did not occur: Safety/medical concerns Walk 10 feet on uneven surfaces activity did not occur: Safety/medical concerns  Function - Comprehension Comprehension: Auditory Comprehension assist level: Follows complex conversation/direction with no assist  Function - Expression Expression: Verbal Expression assist level: Expresses complex ideas: With no assist  Function - Social Interaction Social Interaction assist level: Interacts appropriately with others - No medications needed.  Function - Problem Solving Problem solving assist level: Solves complex problems: Recognizes & self-corrects  Function - Memory Memory assist level: Complete Independence: No helper Patient normally able to recall (first 3 days only): Current season, Location of own room, Staff names and faces, That he or she is in a hospital  Medical Problem List and Plan: 1.  Functional and mobility deficits secondary to left above knee amputation- cont CIR PT, OT, Team conference today please see physician documentation under team conference tab, met with team face-to-face to discuss problems,progress, and goals. Formulized individual treatment plan based on medical history, underlying problem and comorbidities. 2.  A Fib/Saddle PE/Anticoagulation: Pharmaceutical: Pradexa 3. Pain Management: Neuropathy resolving and pain controlled with prn medications. D/C oxycodone due to ileus, tolerating therapy 4. Mood: LCSW to follow for evaluation and support.  5. Neuropsych: This patient is capable of making decisions on his own behalf. 6. Skin/Wound Care: Routine pressure relief measures.7. Fluids/Electrolytes/Nutrition: Strict I/O. Will check lytes closely -8. CAD: Off imdur and BB. Imdur discontinued    10. HTN:   Bp medications discontinue due to hypotension--will continue to monitor BP bid for stability and resume slowly as tolerated.  11. Acute renal failure: Resolved with hydration and medication changes--Likely due to hypoperfusion with BP 80-90s for 48 hours. Will monitor with serial checks.     12.  Hypokalemia: continues to be an issues --Encourage adequate intake to help manage electrolyte abnormality. Will increase supplement - recheck BMET today 13. ABLA: Monitor H/H and for any signs of bleeding with pradexa on board. Add iron supplement.  14. Prediabetes: On Carb modified diet--will monitor BS ac/hs and use SSI for elevated BS.   15. Hypoalbuminemia- start prostat 16.  Ileus recurrent, Has been tolerating full liquids 3 BMs, exam is  benign No hi pitched bowel sounds, appears to be resolving, advance to soft diet today, and if continued improvement, regular carb mod diet tomorrow LOS (Days) 5 A FACE TO FACE EVALUATION WAS PERFORMED  KIRSTEINS,ANDREW E 01/22/2016, 8:07 AM

## 2016-01-22 NOTE — Plan of Care (Signed)
Problem: RH Toilet Transfers Goal: LTG Patient will perform toilet transfers w/assist (OT) LTG: Patient will perform toilet transfers with assist, with/without cues using equipment (OT)  Goal downgraded based on ELOS and having 24 hour supervision at discharge.

## 2016-01-22 NOTE — Progress Notes (Addendum)
Physical Therapy Note  Patient Details  Name: Raymond Jacobson MRN: [id number] Date of Birth: 1941/03/07 Today's Date: 01/22/2016  1100-1200, 60 min ; 1345-1535, 50 min individual tx Pain: 4/10, L  Residual limb; pt declined meds. AM; 1/10 L residual limb PM   tx 1:  Pt reported he needed to use toilet for BM. Attempted supine> sit to L, but unable due to LLE pain. Pt tends to use ballistic motions supine> sit. Squat pivot bed> w/c to R with close supervision. Toilet transfer for continent B and B. Pt stated modifications have already been done at his house and GFF's house: mini-ramp at her house, ramp at his house, BR wall bars in her house and his house. Pt plans to remove door to BR at his house to allow room for w/c. W/c propulsion using bil UEs x 150' and up/down 3% grade, safely modified independent.  Distance limited by shouder fatigue. Gait with RW x 35' with 3 standing rest breaks due to DOE. Pt fatigued and requested getting back to bed.  W/c> bed transfer with supervision, 1 cue for footrest management.  tx 2:  Girlfriend Raquel Sarna here to observe part of session, and discuss equipment and modifications made to her home and pt's home already. Her home entrance is a low threshold, which is ramped.  She does not have wall bars in BR, but has purchased a raised toilet seat with attached handles.  Trial of 18" wide x 18" deep w/c; w/c fits and pt finds it comfortable. Gait with RW x 50' with 4 brief standing rest breaks, min guard assist. W/c>< w/c with close supervision for stand pivot with RW. Pt exhausted and requested getting back in bed. Supervision transfer with 1-2 cues, RW. Pt left resting in bed with bed alarm on and all needs within reach.  PT reviewed with pt bil glut sets and L hip extension into mat, to be performed by him in bed.   Naia Ruff 01/22/2016, 7:54 AM

## 2016-01-22 NOTE — Progress Notes (Signed)
Orthopedic Tech Progress Note Patient Details:  Raymond Jacobson 1941-05-21 [id number]  Patient ID: [name], male   DOB: 06-17-1940, 75 y.o.   MRN: [id number]   Hildred Priest Called in advanced brace order; spoke with [facility]

## 2016-01-22 NOTE — Patient Care Conference (Signed)
Inpatient RehabilitationTeam Conference and Plan of Care Update Date: 01/22/2016   Time: 10:55 Am    Patient Name: Raymond Jacobson      Medical Record Number: [id number]  Date of Birth: 11-01-1940 Sex: Male         Room/Bed: 4M04C/4M04C-01 Payor Info: Payor: MEDICARE / Plan: MEDICARE PART A / Product Type: *No Product type* /    Admitting Diagnosis: fl BKA  Admit Date/Time:  01/17/2016  3:57 PM Admission Comments: No comment available   Primary Diagnosis:  Status post above knee amputation of left lower extremity (HCC) Principal Problem: Status post above knee amputation of left lower extremity [facility])  Patient Active Problem List   Diagnosis Date Noted  . Status post above knee amputation of left lower extremity ([place]) 01/17/2016  . Abdominal distension   . Unilateral AKA ([place])   . Coronary artery disease involving native coronary artery of native heart without angina pectoris   . 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 HLD (hyperlipidemia)   . Post-operative pain   . Acute blood loss anemia   . Right lower lobe pneumonia   . Diastolic dysfunction   . Tachypnea   . Tachycardia   . Hypokalemia   . Leukocytosis   . Thrombocytopenia ([place])   . Surgery, other elective   . Pain of upper abdomen   . Pneumothorax on right   . Chest trauma   . Acute respiratory failure ([place])   . Pre-operative cardiovascular examination 12/31/2015  . PAD (peripheral artery disease) ([place]) 10/15/2015  . ACS (acute coronary syndrome) ([place]) 06/20/2013  . HTN (hypertension) 04/13/2011  . Elevated lipids 04/13/2011  . CAD S/P multiple PCIs 04/13/2011    Expected Discharge Date: Expected Discharge Date: 01/25/16  Team Members Present: Physician leading conference: Dr. Alysia Penna Social Worker Present: Ovidio Kin, LCSW Nurse Present: Rayetta Pigg, RN PT Present: Georjean Mode, PT OT Present: Clyda Greener, OT SLP Present: Windell Moulding, SLP PPS Coordinator present : Daiva Nakayama, RN, CRRN     Current Status/Progress Goal Weekly Team Focus  Medical   ileus resolving, still with watery stools, still with poor balance, weak RLE  maintain wound healing  D/C planning   Bowel/Bladder   Cont x2; LBM 8-23; loose stools remain, no abdominal pain since Sunday; hyperactive bowel sounds  Remain cont x2  Continue monitoring bowels closely   Swallow/Nutrition/ Hydration             ADL's   min guard for toileting and walk-in shower transfers, supervision for all bathing and dressing sit to stand.  Still with dyspnea with exertion and decreased endurrance but O2 sats 93% or greater on room air.  modified indendent overall  selfcare re-training, transfer training, balance re-training, pt/family education, DME education, therapeutic exercise, energy conservation   Mobility   min A gait and transfers  mod I w/c, supervision gait household distances, supervision transfers  strength, balance, gait   Communication             Safety/Cognition/ Behavioral Observations            Pain   Little complaints of pain; scheduled tramadol, skips several doses   < 3 on 0-10 pain scale  Continue monitoring pain as therapy continues   Skin   L AKA with staples and L groin/thigh wounds  No new breakdown while on Rehab  Continue monitoring skin and wounds qshift      *See Care Plan and progress  notes for long and short-term goals.  Barriers to Discharge: has had a couple near falls, sup goals    Possible Resolutions to Barriers:  Strength RLE    Discharge Planning/Teaching Needs:  Home with girlfriend who can provide supervision if needed. Pt wants to be mod/i level before he is discharged.      Team Discussion:  Goals downgraded to supervision since he will have this per girlfriend. Ileus resolving-placed on soft diet today. Working on strength and balance issues. Pain being managed. Ready for stump shrinker. Work on discharge needs and arranging family education.  Revisions  to Treatment Plan:  Downgraded goals to supervision level   Continued Need for Acute Rehabilitation Level of Care: The patient requires daily medical management by a physician with specialized training in physical medicine and rehabilitation for the following conditions: Daily direction of a multidisciplinary physical rehabilitation program to ensure safe treatment while eliciting the highest outcome that is of practical value to the patient.: Yes Daily medical management of patient stability for increased activity during participation in an intensive rehabilitation regime.: Yes Daily analysis of laboratory values and/or radiology reports with any subsequent need for medication adjustment of medical intervention for : Post surgical problems  Jostin Rue, Gardiner Rhyme 01/22/2016, 12:41 PM

## 2016-01-22 NOTE — Progress Notes (Signed)
Social Work Patient ID: Raymond Jacobson, male   DOB: 1940/12/30, 75 y.o.   MRN: [id number]   Met with pt and girlfriend-Emily  to discuss team conference goals-supervision level and discharge 8/26.  Raquel Sarna wants to make sure he is ready due to she has some friends who went home from the hospital too soon and it has been a nightmare. Made aware MD has to approve his discharge and make sure he is Medically stable. Discussed equipment they have a rolling walker, bedside commode and tub bench, the only piece of equipment they need is a wheelchair. Pt would rather purchase one than rent. Will find out the cost for him and get back with him. Discussed home health agencies and they have no preference. Raquel Sarna to come in Friday at 10:00 for family education. Will see how pt does with diet advancement to  a soft diet. Will work on discharge needs.

## 2016-01-23 ENCOUNTER — Inpatient Hospital Stay (HOSPITAL_COMMUNITY): Payer: No Typology Code available for payment source

## 2016-01-23 ENCOUNTER — Inpatient Hospital Stay (HOSPITAL_COMMUNITY): Payer: No Typology Code available for payment source | Admitting: Occupational Therapy

## 2016-01-23 LAB — BASIC METABOLIC PANEL
Anion gap: 10 (ref 5–15)
CO2: 22 mmol/L (ref 22–32)
CREATININE: 0.94 mg/dL (ref 0.61–1.24)
Calcium: 8 mg/dL — ABNORMAL LOW (ref 8.9–10.3)
Chloride: 100 mmol/L — ABNORMAL LOW (ref 101–111)
GFR calc Af Amer: >60 mL/min (ref >60)
GLUCOSE: 151 mg/dL — AB (ref 65–99)
POTASSIUM: 4 mmol/L (ref 3.5–5.1)
Sodium: 132 mmol/L — ABNORMAL LOW (ref 135–145)

## 2016-01-23 LAB — GLUCOSE, CAPILLARY
GLUCOSE-CAPILLARY: 107 mg/dL — AB (ref 65–99)
Glucose-Capillary: 180 mg/dL — ABNORMAL HIGH (ref 65–99)
Glucose-Capillary: 126 mg/dL — ABNORMAL HIGH (ref 65–99)
Glucose-Capillary: 141 mg/dL — ABNORMAL HIGH (ref 65–99)

## 2016-01-23 MED ORDER — TRAMADOL HCL 50 MG PO TABS: 50.0000 mg | ORAL_TABLET | Freq: Four times a day (QID) | ORAL | 0 refills | Status: DC | PRN

## 2016-01-23 MED ORDER — NIACIN ER (ANTIHYPERLIPIDEMIC) 750 MG PO TBCR: 750.0000 mg | EXTENDED_RELEASE_TABLET | Freq: Every day | ORAL | 0 refills | Status: DC

## 2016-01-23 MED ORDER — PANTOPRAZOLE SODIUM 40 MG PO TBEC: 40.0000 mg | DELAYED_RELEASE_TABLET | Freq: Every day | ORAL | 0 refills | Status: AC

## 2016-01-23 MED ORDER — NITROGLYCERIN 0.4 MG SL SUBL: 0.4000 mg | SUBLINGUAL_TABLET | SUBLINGUAL | 12 refills | Status: AC | PRN

## 2016-01-23 MED ORDER — ATORVASTATIN CALCIUM 40 MG PO TABS: 40.0000 mg | ORAL_TABLET | Freq: Every evening | ORAL | 1 refills | Status: DC

## 2016-01-23 MED ORDER — SORBITOL 70 % SOLN
960.0000 mL | TOPICAL_OIL | Freq: Once | ORAL | Status: AC
  Administered 2016-01-23: 960 mL via RECTAL
  Filled 2016-01-23: qty 240

## 2016-01-23 MED ORDER — TRAMADOL HCL 50 MG PO TABS
50.0000 mg | ORAL_TABLET | Freq: Four times a day (QID) | ORAL | Status: DC | PRN
  Administered 2016-01-25: 50 mg via ORAL
  Filled 2016-01-23: qty 1

## 2016-01-23 MED ORDER — DABIGATRAN ETEXILATE MESYLATE 150 MG PO CAPS: 150.0000 mg | ORAL_CAPSULE | Freq: Two times a day (BID) | ORAL | 1 refills | Status: AC

## 2016-01-23 MED ORDER — POLYSACCHARIDE IRON COMPLEX 150 MG PO CAPS: 150.0000 mg | ORAL_CAPSULE | Freq: Two times a day (BID) | ORAL | 0 refills | Status: AC

## 2016-01-23 MED ORDER — METOPROLOL SUCCINATE ER 100 MG PO TB24: 100.0000 mg | ORAL_TABLET | Freq: Every day | ORAL | 1 refills | Status: AC

## 2016-01-23 MED ORDER — METHOCARBAMOL 500 MG PO TABS: 500.0000 mg | ORAL_TABLET | Freq: Four times a day (QID) | ORAL | 0 refills | Status: DC | PRN

## 2016-01-23 NOTE — Progress Notes (Signed)
Occupational Therapy Note  Patient Details  Name: Raymond Jacobson MRN: [id number] Date of Birth: 08-11-1940  Today's Date: 01/23/2016 OT Missed Time: 22 Minutes Missed Time Reason: Patient fatigue (just recieved an enema)   Pt missed 60 min session due to fatigue and just receiving an enema.   Willeen Cass [facility] 01/23/2016, 3:33 PM

## 2016-01-23 NOTE — Progress Notes (Addendum)
Physical Therapy Note  Patient Details  Name: Raymond Jacobson MRN: [id number] Date of Birth: 05/17/1941 Today's Date: 01/23/2016  1405-1535, 30 min individual Missed 30 min due to diarrhea Pain: none reported  Pt with diarrhea today due to enema.  Pt willing to perform bedside tx.  Therapeutic exercise performed with LE to increase strength for functional mobility: supine- 10 x 1 each glut sets, L hip abduction, L hip extension, bil hip adduction against towel roll for resistance  2 x 10 each modified abdominal crunches,  L hip ext; R straight leg raises.  Pt left resting in bed with bed alarm set and all needs within reach. Pt declined donning shrinker sock until RN re-dressed R residual limb.  Shamarra Warda 01/23/2016, 7:50 AM

## 2016-01-23 NOTE — Progress Notes (Signed)
Social Work Patient ID: Raymond Jacobson, male   DOB: 01-31-1941, 75 y.o.   MRN: [id number]    Met with pt to discuss private pay cost of a wheelchair-informed him it would cost $452.00 he wants to go ahead and purchase one. Have contacted [facility] liaison to come and discuss cost and money with pt. He will come up to his room between 11:00-1:00 the tome he is available. Pt feeling ok but just can';t seem to pass gas which is causing him much pain. Will continue to work on his discharge needs for impending discharge Sat. Have faxed daughter's FMLA back to her in [place].

## 2016-01-23 NOTE — Progress Notes (Signed)
Subjective/Complaints: Pt slept ok, had 2-3 BMs yest loose with much gas Reviewed KUB very little stool burden  ROS- ,no CP/SOB, -N/V/D  Objective: Vital Signs: Blood pressure (!) 151/64, pulse 75, temperature 98.2 F (36.8 C), temperature source Oral, resp. rate 17, height _0  (1.727 m), weight 83.9 kg (185 lb), SpO2 96 %. No results found. Results for orders placed or performed during the hospital encounter of 01/17/16 (from the past 72 hour(s))  Glucose, capillary     Status: Abnormal   Collection Time: 01/20/16 11:23 AM  Result Value Ref Range   Glucose-Capillary 126 (H) 65 - 99 mg/dL  Glucose, capillary     Status: Abnormal   Collection Time: 01/20/16  5:13 PM  Result Value Ref Range   Glucose-Capillary 129 (H) 65 - 99 mg/dL  Glucose, capillary     Status: Abnormal   Collection Time: 01/20/16  8:34 PM  Result Value Ref Range   Glucose-Capillary 119 (H) 65 - 99 mg/dL   Comment 1 Notify RN   Basic metabolic panel     Status: Abnormal   Collection Time: 01/20/16 10:04 PM  Result Value Ref Range   Sodium 130 (L) 135 - 145 mmol/L   Potassium 3.9 3.5 - 5.1 mmol/L   Chloride 100 (L) 101 - 111 mmol/L   CO2 26 22 - 32 mmol/L   Glucose, Bld 119 (H) 65 - 99 mg/dL   BUN 6 6 - 20 mg/dL   Creatinine, Ser 0.92 0.61 - 1.24 mg/dL   Calcium 7.7 (L) 8.9 - 10.3 mg/dL   GFR calc non Af Amer >60 >60 mL/min   GFR calc Af Amer >60 >60 mL/min    Comment: (NOTE) The eGFR has been calculated using the CKD EPI equation. This calculation has not been validated in all clinical situations. eGFR's persistently <60 mL/min signify possible Chronic Kidney Disease.    Anion gap 4 (L) 5 - 15  Glucose, capillary     Status: Abnormal   Collection Time: 01/21/16  6:52 AM  Result Value Ref Range   Glucose-Capillary 105 (H) 65 - 99 mg/dL  Glucose, capillary     Status: Abnormal   Collection Time: 01/21/16 11:25 AM  Result Value Ref Range   Glucose-Capillary 134 (H) 65 - 99 mg/dL  Glucose,  capillary     Status: Abnormal   Collection Time: 01/21/16  4:35 PM  Result Value Ref Range   Glucose-Capillary 129 (H) 65 - 99 mg/dL  Glucose, capillary     Status: Abnormal   Collection Time: 01/21/16  8:43 PM  Result Value Ref Range   Glucose-Capillary 109 (H) 65 - 99 mg/dL   Comment 1 Notify RN   Creatinine, serum     Status: None   Collection Time: 01/22/16  5:42 AM  Result Value Ref Range   Creatinine, Ser 0.83 0.61 - 1.24 mg/dL   GFR calc non Af Amer >60 >60 mL/min   GFR calc Af Amer >60 >60 mL/min    Comment: (NOTE) The eGFR has been calculated using the CKD EPI equation. This calculation has not been validated in all clinical situations. eGFR's persistently <60 mL/min signify possible Chronic Kidney Disease.   Glucose, capillary     Status: Abnormal   Collection Time: 01/22/16  6:34 AM  Result Value Ref Range   Glucose-Capillary 108 (H) 65 - 99 mg/dL  Glucose, capillary     Status: Abnormal   Collection Time: 01/22/16 11:25 AM  Result Value Ref Range  Glucose-Capillary 151 (H) 65 - 99 mg/dL  Glucose, capillary     Status: Abnormal   Collection Time: 01/22/16  4:09 PM  Result Value Ref Range   Glucose-Capillary 127 (H) 65 - 99 mg/dL  Glucose, capillary     Status: Abnormal   Collection Time: 01/22/16  8:32 PM  Result Value Ref Range   Glucose-Capillary 150 (H) 65 - 99 mg/dL   Comment 1 Notify RN   Glucose, capillary     Status: Abnormal   Collection Time: 01/23/16  6:39 AM  Result Value Ref Range   Glucose-Capillary 107 (H) 65 - 99 mg/dL   Comment 1 Notify RN      HEENT: normal Cardio: RRR and no murmur Resp: CTA B/L and unlabored GI: BS positive and NT, ND, Normal bowel sounds Extremity:  Pulses positive and No Edema Skin:   Left AKA incision CDI, medial thigh incision (from re vasculariztion surgery, sm amt serosang drainage Neuro: Alert/Oriented and Abnormal Motor 5/5 in BUE and RLE, 4- L HF, Hip add/abd Musc/Skel:  Normal Gen  NAD   Assessment/Plan: 1. 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 Physiatrist is providing close team supervision and 24 hour management of active medical problems listed below. Physiatrist and rehab team continue to assess barriers to discharge/monitor patient progress toward functional and medical goals. FIM: Function - Bathing Position: Other (comment) Body parts bathed by patient: Right arm, Left arm, Chest, Abdomen, Front perineal area, Right upper leg, Left upper leg, Back, Buttocks, Right lower leg Body parts bathed by helper: Right lower leg, Left lower leg Bathing not applicable: Left lower leg Assist Level: Supervision or verbal cues  Function- Upper Body Dressing/Undressing What is the patient wearing?: Pull over shirt/dress Pull over shirt/dress - Perfomed by patient: Thread/unthread right sleeve, Thread/unthread left sleeve, Put head through opening, Pull shirt over trunk Assist Level: Set up Set up : To obtain clothing/put away Function - Lower Body Dressing/Undressing What is the patient wearing?: Pants, Shoes Position: Wheelchair/chair at sink Pants- Performed by patient: Thread/unthread right pants leg, Thread/unthread left pants leg, Pull pants up/down Pants- Performed by helper: Thread/unthread left pants leg, Pull pants up/down Non-skid slipper socks- Performed by helper: Don/doff right sock Shoes - Performed by patient: Don/doff right shoe Assist for lower body dressing: Set up  Function - Toileting Toileting activity did not occur: Refused (difficult to pass stool sitting on toilet, GI issues) Toileting steps completed by patient: Adjust clothing prior to toileting, Performs perineal hygiene, Adjust clothing after toileting Toileting steps completed by helper: Adjust clothing prior to toileting, Adjust clothing after toileting, Performs perineal  hygiene Toileting Assistive Devices: Grab bar or rail Assist level: Supervision or verbal cues  Function Midwife transfer activity did not occur: Refused Toilet transfer assistive device: Elevated toilet seat/BSC over toilet Assist level to toilet: Touching or steadying assistance (Pt > 75%) Assist level from toilet: Touching or steadying assistance (Pt > 75%)  Function - Chair/bed transfer Chair/bed transfer method: Stand pivot Chair/bed transfer assist level: Supervision or verbal cues Chair/bed transfer assistive device: Walker Chair/bed transfer details: Verbal cues for technique  Function - Locomotion: Wheelchair Will patient use wheelchair at discharge?: Yes Type: Manual Max wheelchair distance: 150 Assist Level: No help, No cues, assistive device, takes more than reasonable amount of time Assist Level: No help, No cues, assistive device, takes more than reasonable amount of time Assist Level: No  help, No cues, assistive device, takes more than reasonable amount of time Turns around,maneuvers to table,bed, and toilet,negotiates 3% grade,maneuvers on rugs and over doorsills: Yes Function - Locomotion: Ambulation Assistive device: Walker-rolling Max distance: 50 Assist level: Touching or steadying assistance (Pt > 75%) Assist level: Touching or steadying assistance (Pt > 75%) Walk 50 feet with 2 turns activity did not occur: Safety/medical concerns Assist level: Touching or steadying assistance (Pt > 75%) Walk 150 feet activity did not occur: Safety/medical concerns Walk 10 feet on uneven surfaces activity did not occur: Safety/medical concerns  Function - Comprehension Comprehension: Auditory Comprehension assist level: Follows complex conversation/direction with no assist  Function - Expression Expression: Verbal Expression assist level: Expresses complex ideas: With no assist  Function - Social Interaction Social Interaction assist level: Interacts  appropriately with others - No medications needed.  Function - Problem Solving Problem solving assist level: Solves complex problems: Recognizes & self-corrects  Function - Memory Memory assist level: Complete Independence: No helper Patient normally able to recall (first 3 days only): Current season, Location of own room, Staff names and faces, That he or she is in a hospital  Medical Problem List and Plan: 1.  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  A Fib/Saddle PE/Anticoagulation: Pharmaceutical: Pradexa 3. Pain Management: Neuropathy resolving and pain controlled with prn medications. D/C oxycodone due to ileus, tolerating therapy 4. Mood: LCSW to follow for evaluation and support.  5. Neuropsych: This patient is capable of making decisions on his own behalf. 6. Skin/Wound Care: Routine pressure relief measures.7. Fluids/Electrolytes/Nutrition: Strict I/O. Will check lytes closely -8. CAD: Off imdur and BB. Imdur discontinued    10. HTN:  Bp medications discontinue due to hypotension--will continue to monitor BP bid for stability and resume slowly as tolerated.  Vitals:   01/22/16 1300 01/23/16 0534  BP: 123/62 (!) 151/64  Pulse: 87 75  Resp: 18 17  Temp: 97.7 F (36.5 C) 98.2 F (36.8 C)   11. Acute renal failure: Resolved with hydration and medication changes--Likely due to hypoperfusion with BP 80-90s for 48 hours. Will monitor with serial checks.     12.  Hypokalemia: continues to be an issues --Encourage adequate intake to help manage electrolyte abnormality. Will increase supplement - recheck BMET 13. ABLA: Monitor H/H and for any signs of bleeding with pradexa on board. Add iron supplement.  14. Prediabetes: On Carb modified diet--will monitor BS ac/hs and use SSI for elevated BS.   15. Hypoalbuminemia- start prostat 16.  Ileus recurrent, Has been tolerating full  liquids 3 BMs, Non tender occ hi pitched bowel sounds, appears to be resolving,Cont soft diet today, and give SMOG enema LOS (Days) 6 A FACE TO FACE EVALUATION WAS PERFORMED  Mirakle Tomlin E 01/23/2016, 7:40 AM

## 2016-01-23 NOTE — Progress Notes (Signed)
Orthopedic Tech Progress Note Patient Details:  [name] Nov 28, 1940 [id number] Brace order completed hanger vendor. Patient ID: Raymond Jacobson, male   DOB: 07-29-1940, 75 y.o.   MRN: [id number]   Braulio Bosch 01/23/2016, 5:51 PM

## 2016-01-23 NOTE — Progress Notes (Signed)
Occupational Therapy Session Note  Patient Details  Name: Raymond Jacobson MRN: [id number] Date of Birth: Jan 30, 1941  Today's Date: 01/23/2016 OT Individual Time: [id number] OT Individual Time Calculation (min): 32 min     Short Term Goals:Week 1:  OT Short Term Goal 1 (Week 1): STG=LTG d/t short LOS  Skilled Therapeutic Interventions/Progress Updates:    Pt completed UE exercises using green theraband sitting EOB.  Mod assist for sequence and technique but once shown he was able to return demonstrate.  See specific exercises and repetitions below.  No report of pain during session but pt had received enema earlier today so session was modified to not have pt leave the room secondary to bowel urgency.  No BM during OT session with pt left in bed at end of session with call button in reach.    Therapy Documentation Precautions:  Precautions Precautions: Fall Restrictions Weight Bearing Restrictions: No LLE Weight Bearing: Non weight bearing  Pain: Pain Assessment Pain Assessment: No/denies pain ADL: ADL ADL Comments: See Functional Assessment Tool Exercises: General Exercises - Upper Extremity Shoulder Flexion: Strengthening;Both;15 reps;Theraband;Seated Theraband Level (Shoulder Flexion): Level 3 (Green) Shoulder Extension: Strengthening;15 reps;Seated;Theraband;Both Shoulder Horizontal ABduction: Strengthening;Both;15 reps;Seated;Theraband Elbow Extension: AROM;Both;20 reps;Seated;Theraband Theraband Level (Elbow Extension): Level 3 (Green) Shoulder Exercises Shoulder External Rotation: Strengthening;Both;15 reps;Seated;Theraband Theraband Level (Shoulder External Rotation): Level 3 (Green)  See Function Navigator for Current Functional Status.   Therapy/Group: Individual Therapy  Tam Delisle OTR/L 01/23/2016, 1:32 PM

## 2016-01-23 NOTE — Progress Notes (Signed)
Occupational Therapy Session Note  Patient Details  Name: Raymond Jacobson MRN: [id number] Date of Birth: 09-27-1940  Today's Date: 01/23/2016 OT Individual Time: 0900-1000 OT Individual Time Calculation (min): 60 min     Short Term Goals: Week 1:  OT Short Term Goal 1 (Week 1): STG=LTG d/t short LOS  Skilled Therapeutic Interventions/Progress Updates:    Pt engaged in BADL retraining including bathing at shower level.  Pt declined donning clothing and donned hospital gown.  Pt stated that RN was going to give him an enema and he didn't want to run the risk of soiling his clothing.  Pt amb with RW into bathroom and bathed (after barriers applied to LLE) while seated on tub bench.  Pt used lateral leans to bathe buttocks.  Pt requires more than a reasonable amount of time to complete tasks with multiple rest breaks.  Pt commented on increased energy required to complete simple tasks.  Pt educated on energy conservation strategies.  Pt verbalized understanding.  Pt returned to bed awaiting RN to administer enema. Focus on activity tolerance, functional amb with RW, and safety awareness to increase independence with BADLs.  Therapy Documentation Precautions:  Precautions Precautions: Fall Restrictions Weight Bearing Restrictions: Yes LLE Weight Bearing: Non weight bearing Pain:  Pt denied pain ADL: ADL ADL Comments: See Functional Assessment Tool  See Function Navigator for Current Functional Status.   Therapy/Group: Individual Therapy  Leroy Libman 01/23/2016, 10:45 AM

## 2016-01-24 ENCOUNTER — Ambulatory Visit (HOSPITAL_COMMUNITY): Payer: No Typology Code available for payment source | Admitting: Physical Therapy

## 2016-01-24 ENCOUNTER — Inpatient Hospital Stay (HOSPITAL_COMMUNITY): Payer: No Typology Code available for payment source | Admitting: Occupational Therapy

## 2016-01-24 ENCOUNTER — Inpatient Hospital Stay (HOSPITAL_COMMUNITY): Payer: No Typology Code available for payment source | Admitting: Physical Therapy

## 2016-01-24 LAB — GLUCOSE, CAPILLARY
GLUCOSE-CAPILLARY: 126 mg/dL — AB (ref 65–99)
GLUCOSE-CAPILLARY: 103 mg/dL — AB (ref 65–99)
Glucose-Capillary: 111 mg/dL — ABNORMAL HIGH (ref 65–99)
Glucose-Capillary: 171 mg/dL — ABNORMAL HIGH (ref 65–99)

## 2016-01-24 MED ORDER — ORAL CARE MOUTH RINSE
15.0000 mL | Freq: Two times a day (BID) | OROMUCOSAL | Status: DC
  Administered 2016-01-24 – 2016-01-25 (×2): 15 mL via OROMUCOSAL

## 2016-01-24 NOTE — Progress Notes (Signed)
Physical Therapy Discharge Summary  Patient Details  Name: Raymond Jacobson MRN: [id number] Date of Birth: 05-Dec-1940  Patient has met 9 of 9 long term goals due to improved activity tolerance, improved balance, improved postural control, increased strength, increased range of motion, decreased pain and ability to compensate for deficits.  Patient to discharge at an ambulatory level Supervision. Mod I with w/c mobility.  Patient's care partner is independent to provide the necessary supervision assistance at discharge.  Reasons goals not met: stair goal d/c'd as pt has had ramp built at his home   Recommendation:  Patient will benefit from ongoing skilled PT services in home health setting to continue to advance safe functional mobility, address ongoing impairments in strength, ROM, mobility, gait, and minimize fall risk.  Equipment: w/c  Reasons for discharge: treatment goals met and discharge from hospital  Patient/family agrees with progress made and goals achieved: Yes  PT Discharge Precautions/Restrictions Precautions Precautions: Fall Restrictions Weight Bearing Restrictions: Yes LLE Weight Bearing: Non weight bearing Pain Pain Assessment Pain Assessment: No/denies pain  Cognition Overall Cognitive Status: Within Functional Limits for tasks assessed Orientation Level: Oriented X4 Sensation Sensation Light Touch: Appears Intact Proprioception: Appears Intact Coordination Gross Motor Movements are Fluid and Coordinated: Yes Fine Motor Movements are Fluid and Coordinated: Yes Motor  Motor Motor: Within Functional Limits   Trunk/Postural Assessment  Cervical Assessment Cervical Assessment:  (fwd head) Thoracic Assessment Thoracic Assessment:  (rounded shoulders) Lumbar Assessment Lumbar Assessment: Within Functional Limits Postural Control Protective Responses: improved since eval  Balance Dynamic Sitting Balance Sitting balance - Comments: mod I Static Standing  Balance Static Standing - Balance Support: Bilateral upper extremity supported Static Standing - Level of Assistance: 5: Stand by assistance Dynamic Standing Balance Dynamic Standing - Balance Support: During functional activity Dynamic Standing - Level of Assistance: 5: Stand by assistance Extremity Assessment      RLE Assessment RLE Assessment: Within Functional Limits LLE Strength LLE Overall Strength Comments: grossly 3+/5   See Function Navigator for Current Functional Status.  Zofia Peckinpaugh 01/24/2016, 11:52 AM

## 2016-01-24 NOTE — Progress Notes (Signed)
Occupational Therapy Discharge Summary  Patient Details  Name: Raymond Jacobson MRN: [id number] Date of Birth: 1940/11/20  Today's Date: 01/24/2016 OT Individual Time: 1002-1100 OT Individual Time Calculation (min): 58 min     Patient has met 8 of 10 long term goals due to improved activity tolerance, improved balance and ability to compensate for deficits.  Patient to discharge at overall Supervision level.  Patient's care partner is independent to provide the necessary physical assistance at discharge.    Reasons goals not met: Pt needs min assist for LB dressing sit to stand.  Did not address meal prep goal as pt will have 24 hour supervision  Recommendation:  Patient will benefit from ongoing skilled OT services in home health setting to continue to advance functional skills in the area of BADL.  Pt currently supervision approaching modified independent level for most selfcare tasks.  Still demonstrates limited endurance for self care tasks, requiring frequent rest breaks.  Oxygen sats remain 93% or greater on RA with HR increasing up to 108 BPM.  Feel he will benefit from [facility] eval for safety and environmental modifications as well as continued OT to progress to modified independent level for selfcare tasks.    Equipment: wheelchair  Reasons for discharge: treatment goals met and discharge from hospital  Patient/family agrees with progress made and goals achieved: Yes  OT Discharge Precautions/Restrictions Precautions Precautions: Fall Restrictions Weight Bearing Restrictions: No  Pain  No report of pain during session when asked  ADL ADL ADL Comments: See Functional Assessment Tool Vision/Perception  Vision- History Baseline Vision/History: No visual deficits Patient Visual Report: No change from baseline Vision- Assessment Vision Assessment?: No apparent visual deficits  Cognition Overall Cognitive Status: Within Functional Limits for tasks assessed Arousal/Alertness:  Awake/alert Sensation Sensation Light Touch: Appears Intact Stereognosis: Appears Intact Hot/Cold: Appears Intact Proprioception: Appears Intact Coordination Gross Motor Movements are Fluid and Coordinated: Yes Fine Motor Movements are Fluid and Coordinated: Yes Motor  Motor Motor: Within Functional Limits Mobility  Bed Mobility Bed Mobility: Supine to Sit;Sit to Supine Supine to Sit: 6: Modified independent (Device/Increase time) Sit to Supine: 6: Modified independent (Device/Increase time) Transfers Transfers: Sit to Stand Sit to Stand: 5: Supervision;From bed;From chair/3-in-1;With upper extremity assist Stand to Sit: 5: Supervision;With upper extremity assist;With armrests;To bed;To chair/3-in-1  Trunk/Postural Assessment  Cervical Assessment Cervical Assessment: Within Functional Limits Thoracic Assessment Thoracic Assessment: Within Functional Limits Lumbar Assessment Lumbar Assessment: Within Functional Limits  Balance Balance Balance Assessed: Yes Dynamic Sitting Balance Dynamic Sitting - Balance Support: Right upper extremity supported Dynamic Sitting - Level of Assistance: 6: Modified independent (Device/Increase time) Static Standing Balance Static Standing - Balance Support: During functional activity Static Standing - Level of Assistance: 6: Modified independent (Device/Increase time) Dynamic Standing Balance Dynamic Standing - Balance Support: During functional activity Dynamic Standing - Level of Assistance: 5: Stand by assistance Extremity/Trunk Assessment RUE Assessment RUE Assessment: Within Functional Limits LUE Assessment LUE Assessment: Within Functional Limits   See Function Navigator for Current Functional Status.  Raegan Winders  OTR/L 01/24/2016, 4:57 PM

## 2016-01-24 NOTE — Progress Notes (Signed)
Social Work  Discharge Note  The overall goal for the admission was met for:   Discharge location: Yes-GOING TO [place]  Length of Stay: Yes-9 DAYS  Discharge activity level: Yes-MOD/I-SUPERVISION LEVEL  Home/community participation: Yes  Services provided included: MD, RD, PT, OT, RN, CM, TR, Pharmacy and SW  Financial Services: Medicare and Private Insurance: [place]  Follow-up services arranged: Home Health: [facility]-PT & RN, DME: [facility]-W[facility]-PRIVATED PAIDED FOR ONE and Patient/Family request agency HH: REFERRAL MADE ON ACUTE, DME: NO PREF  Comments (or additional information):EMILY HAS BEEN HERE AND COMPLETED FAMILY EDUCATION WITH PT. PLANS TO STY WITH HER FOR 3-4 WEEKS, THEN BACK HOME AND DAUGHTER FROM [place] TO COME DOWN HERE AND ASSIST. DAUGHTER'S FMLA PAPERS COMPLETED AND FAXED BACK TO HER. PT FEELS [place]  Patient/Family verbalized understanding of follow-up arrangements: Yes  Individual responsible for coordination of the follow-up plan: PATIENT & EMILY-GIRLFRIEND  Confirmed correct DME delivered: Elease Hashimoto 01/24/2016    Elease Hashimoto

## 2016-01-24 NOTE — Progress Notes (Addendum)
Physical Therapy Note  Patient Details  Name: Raymond Jacobson MRN: [id number] Date of Birth: 1941/02/12 Today's Date: 01/24/2016    Time: 830-930 60 minutes  1:1 No c/o pain. Pt with request to use restroom. Pt performed bed mobility and sit to stand mod I. Pt performed gait in hospital room and bathroom with supervision, transfers with mod I from toilet.  Pt performed rolling in bed with mod I to don compression wrap for Lt residual limb. Pt able to instruct caregiver on how to don wrap.  Gait in controlled environment with RW 40' with supervision.  Supine and sidelying therex for LE and core strengthening. Pt with good knowledge of HEP.  Time 2: 1100-1145 45 minutes  1:1 No c/o pain. Session focused on pt/family education with pt and girlfriend. W/c mobility demonstrated at mod I level in home and controlled environments. Pt's girlfriend educated on and return demo'd w/c parts management and how to fold w/c to put into car.  Car transfer to simulated sedan height car with pt using RW with supervision.  Pt/ girlfriend educated on safety at home and energy conservation techniques.  Pt performed up/down ramp with RW as pt's sons built a ramp for pt at his home to reduce threshold step. (Pt will be staying at girlfriends home x 3-4 weeks which is level entry).  Pt performed gait in home and controlled environments with girlfriend providing supervision. Pt/girlfriend both state they feel safe for pt to d/c home at this level of care.   Hoyle Barkdull 01/24/2016, 9:29 AM

## 2016-01-24 NOTE — Progress Notes (Signed)
Subjective/Complaints: Good results with SMOG enema Tolerating soft diet Received stump shrinker  ROS- ,no CP/SOB, -N/V/D  Objective: Vital Signs: Blood pressure (!) 143/74, pulse 77, temperature 98.3 F (36.8 C), temperature source Oral, resp. rate 16, height _0  (1.727 m), weight 83.9 kg (185 lb), SpO2 97 %. No results found. Results for orders placed or performed during the hospital encounter of 01/17/16 (from the past 72 hour(s))  Glucose, capillary     Status: Abnormal   Collection Time: 01/21/16 11:25 AM  Result Value Ref Range   Glucose-Capillary 134 (H) 65 - 99 mg/dL  Glucose, capillary     Status: Abnormal   Collection Time: 01/21/16  4:35 PM  Result Value Ref Range   Glucose-Capillary 129 (H) 65 - 99 mg/dL  Glucose, capillary     Status: Abnormal   Collection Time: 01/21/16  8:43 PM  Result Value Ref Range   Glucose-Capillary 109 (H) 65 - 99 mg/dL   Comment 1 Notify RN   Creatinine, serum     Status: None   Collection Time: 01/22/16  5:42 AM  Result Value Ref Range   Creatinine, Ser 0.83 0.61 - 1.24 mg/dL   GFR calc non Af Amer >60 >60 mL/min   GFR calc Af Amer >60 >60 mL/min    Comment: (NOTE) The eGFR has been calculated using the CKD EPI equation. This calculation has not been validated in all clinical situations. eGFR's persistently <60 mL/min signify possible Chronic Kidney Disease.   Glucose, capillary     Status: Abnormal   Collection Time: 01/22/16  6:34 AM  Result Value Ref Range   Glucose-Capillary 108 (H) 65 - 99 mg/dL  Glucose, capillary     Status: Abnormal   Collection Time: 01/22/16 11:25 AM  Result Value Ref Range   Glucose-Capillary 151 (H) 65 - 99 mg/dL  Glucose, capillary     Status: Abnormal   Collection Time: 01/22/16  4:09 PM  Result Value Ref Range   Glucose-Capillary 127 (H) 65 - 99 mg/dL  Glucose, capillary     Status: Abnormal   Collection Time: 01/22/16  8:32 PM  Result Value Ref Range   Glucose-Capillary 150 (H) 65 - 99  mg/dL   Comment 1 Notify RN   Glucose, capillary     Status: Abnormal   Collection Time: 01/23/16  6:39 AM  Result Value Ref Range   Glucose-Capillary 107 (H) 65 - 99 mg/dL   Comment 1 Notify RN   Basic metabolic panel     Status: Abnormal   Collection Time: 01/23/16  8:22 AM  Result Value Ref Range   Sodium 132 (L) 135 - 145 mmol/L   Potassium 4.0 3.5 - 5.1 mmol/L   Chloride 100 (L) 101 - 111 mmol/L   CO2 22 22 - 32 mmol/L   Glucose, Bld 151 (H) 65 - 99 mg/dL   BUN <5 (L) 6 - 20 mg/dL   Creatinine, Ser 0.94 0.61 - 1.24 mg/dL   Calcium 8.0 (L) 8.9 - 10.3 mg/dL   GFR calc non Af Amer >60 >60 mL/min   GFR calc Af Amer >60 >60 mL/min    Comment: (NOTE) The eGFR has been calculated using the CKD EPI equation. This calculation has not been validated in all clinical situations. eGFR's persistently <60 mL/min signify possible Chronic Kidney Disease.    Anion gap 10 5 - 15  Glucose, capillary     Status: Abnormal   Collection Time: 01/23/16 11:26 AM  Result Value  Ref Range   Glucose-Capillary 180 (H) 65 - 99 mg/dL  Glucose, capillary     Status: Abnormal   Collection Time: 01/23/16  4:39 PM  Result Value Ref Range   Glucose-Capillary 126 (H) 65 - 99 mg/dL  Glucose, capillary     Status: Abnormal   Collection Time: 01/23/16  8:39 PM  Result Value Ref Range   Glucose-Capillary 141 (H) 65 - 99 mg/dL   Comment 1 Notify RN   Glucose, capillary     Status: Abnormal   Collection Time: 01/24/16  6:32 AM  Result Value Ref Range   Glucose-Capillary 111 (H) 65 - 99 mg/dL   Comment 1 Notify RN      HEENT: normal Cardio: RRR and no murmur Resp: CTA B/L and unlabored GI: BS positive and NT, ND, Normal bowel sounds Extremity:  Pulses positive and No Edema Skin:   Left AKA incision CDI, medial thigh incision (from re vasculariztion surgery, sm amt serosang drainage Neuro: Alert/Oriented and Abnormal Motor 5/5 in BUE and RLE, 4- L HF, Hip add/abd Musc/Skel:  Normal Gen  NAD   Assessment/Plan: 1. 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 Physiatrist is providing close team supervision and 24 hour management of active medical problems listed below. Physiatrist and rehab team continue to assess barriers to discharge/monitor patient progress toward functional and medical goals. FIM: Function - Bathing Position: Shower Body parts bathed by patient: Right arm, Left arm, Chest, Abdomen, Front perineal area, Right upper leg, Left upper leg, Back, Buttocks, Right lower leg Body parts bathed by helper: Right lower leg, Left lower leg Bathing not applicable: Left lower leg Assist Level: Supervision or verbal cues  Function- Upper Body Dressing/Undressing What is the patient wearing?: Hospital gown Pull over shirt/dress - Perfomed by patient: Thread/unthread right sleeve, Thread/unthread left sleeve, Put head through opening, Pull shirt over trunk Assist Level: Set up Set up : To obtain clothing/put away Function - Lower Body Dressing/Undressing What is the patient wearing?: Hospital Gown Position: Wheelchair/chair at sink Pants- Performed by patient: Thread/unthread right pants leg, Thread/unthread left pants leg, Pull pants up/down Pants- Performed by helper: Thread/unthread left pants leg, Pull pants up/down Non-skid slipper socks- Performed by helper: Don/doff right sock Shoes - Performed by patient: Don/doff right shoe Assist for lower body dressing: Set up  Function - Toileting Toileting activity did not occur: Refused (difficult to pass stool sitting on toilet, GI issues) Toileting steps completed by patient: Adjust clothing prior to toileting, Performs perineal hygiene, Adjust clothing after toileting Toileting steps completed by helper: Adjust clothing prior to toileting, Adjust clothing after toileting, Performs perineal hygiene Toileting Assistive  Devices: Grab bar or rail Assist level: Supervision or verbal cues  Function Midwife transfer activity did not occur: Refused Toilet transfer assistive device: Elevated toilet seat/BSC over toilet Assist level to toilet: Touching or steadying assistance (Pt > 75%) Assist level from toilet: Touching or steadying assistance (Pt > 75%)  Function - Chair/bed transfer Chair/bed transfer method: Stand pivot Chair/bed transfer assist level: Supervision or verbal cues Chair/bed transfer assistive device: Walker Chair/bed transfer details: Verbal cues for technique  Function - Locomotion: Wheelchair Will patient use wheelchair at discharge?: Yes Type: Manual Max wheelchair distance: 150 Assist Level: No help, No cues, assistive device, takes more than reasonable amount of time Assist Level: No help, No cues, assistive device, takes more than reasonable amount of time Assist  Level: No help, No cues, assistive device, takes more than reasonable amount of time Turns around,maneuvers to table,bed, and toilet,negotiates 3% grade,maneuvers on rugs and over doorsills: Yes Function - Locomotion: Ambulation Assistive device: Walker-rolling Max distance: 50 Assist level: Touching or steadying assistance (Pt > 75%) Assist level: Touching or steadying assistance (Pt > 75%) Walk 50 feet with 2 turns activity did not occur: Safety/medical concerns Assist level: Touching or steadying assistance (Pt > 75%) Walk 150 feet activity did not occur: Safety/medical concerns Walk 10 feet on uneven surfaces activity did not occur: Safety/medical concerns  Function - Comprehension Comprehension: Auditory Comprehension assist level: Follows complex conversation/direction with no assist  Function - Expression Expression: Verbal Expression assist level: Expresses complex ideas: With no assist  Function - Social Interaction Social Interaction assist level: Interacts appropriately with others -  No medications needed.  Function - Problem Solving Problem solving assist level: Solves complex problems: Recognizes & self-corrects  Function - Memory Memory assist level: Complete Independence: No helper Patient normally able to recall (first 3 days only): Current season, Location of own room, Staff names and faces, That he or she is in a hospital  Medical Problem List and Plan: 1.  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  A Fib/Saddle PE/Anticoagulation: Pharmaceutical: Pradexa 3. Pain Management: Neuropathy resolving and pain controlled with prn medications. D/C oxycodone due to ileus, tolerating therapy 4. Mood: LCSW to follow for evaluation and support.  5. Neuropsych: This patient is capable of making decisions on his own behalf. 6. Skin/Wound Care: Routine pressure relief measures.7. Fluids/Electrolytes/Nutrition: Strict I/O. Will check lytes closely -8. CAD: Off imdur and BB. Imdur discontinued    10. HTN:  Bp medications discontinue due to hypotension--will continue to monitor BP bid for stability and resume slowly as tolerated.  Vitals:   01/23/16 1414 01/24/16 0536  BP: (!) 139/56 (!) 143/74  Pulse: 81 77  Resp: 17 16  Temp: 97.9 F (36.6 C) 98.3 F (36.8 C)   11. Acute renal failure: Resolved with hydration and medication changes--Likely due to hypoperfusion with BP 80-90s for 48 hours. Will monitor with serial checks.     12.  Hypokalemia: continues to be an issues --Encourage adequate intake to help manage electrolyte abnormality. Will increase supplement - recheck BMET 13. ABLA: Monitor H/H and for any signs of bleeding with pradexa on board. Add iron supplement.  14. Prediabetes: On Carb modified diet--will monitor BS ac/hs and use SSI for elevated BS.  CBG (last 3)   Recent Labs  01/23/16 1639 01/23/16 2039 01/24/16 0632  GLUCAP 126* 141* 111*     15. Hypoalbuminemia- start prostat 16.   Ileus recurrent, now resolving may advance to regular diet todayLOS (Days) 7 A FACE TO FACE EVALUATION WAS PERFORMED  Merrick Maggio E 01/24/2016, 7:17 AM

## 2016-01-24 NOTE — Plan of Care (Signed)
Problem: RH Dressing Goal: LTG Patient will perform lower body dressing w/assist (OT) LTG: Patient will perform lower body dressing with assist, with/without cues in positioning using equipment (OT)  Outcome: Not Met (add Reason) Needs supervision for LB dressing sit to stand.  Problem: RH Simple Meal Prep Goal: LTG Patient will perform simple meal prep w/assist (OT) LTG: Patient will perform simple meal prep with assistance, with/without cues (OT).  Outcome: Not Met (add Reason) Did not attempt this visit.  Pt will have 24 hour supervision at discharge.

## 2016-01-24 NOTE — Discharge Instructions (Signed)
Inpatient Rehab Discharge Instructions  Terreon Gelder Discharge date and time: No discharge date for patient encounter.   Activities/Precautions/ Functional Status: Activity: activity as tolerated Diet: Soft diabetic diet Wound Care: keep wound clean and dry Functional status:  ___ No restrictions     ___ Walk up steps independently ___ 24/7 supervision/assistance   ___ Walk up steps with assistance ___ Intermittent supervision/assistance  ___ Bathe/dress independently ___ Walk with walker     _x__ Bathe/dress with assistance ___ Walk Independently    ___ Shower independently ___ Walk with assistance    ___ Shower with assistance ___ No alcohol     ___ Return to work/school ________  Special Instructions:     COMMUNITY REFERRALS UPON DISCHARGE:    Home Health:   PT & RN  [facility]   Date of last service:01/25/2016  Medical Equipment/Items Ordered:WHEELCHAIR  Agency/Supplier:[employer]   [phone]   GENERAL COMMUNITY RESOURCES FOR PATIENT/FAMILY: Support Groups:[facility] TUESDAY September 12 7-8:30 PM [place] [phone] (SECOND Tuesday OF [facility] MONTH)   My questions have been answered and I understand these instructions. I will adhere to these goals and the provided educational materials after my discharge from the hospital.  Patient/Caregiver Signature _______________________________ Date __________  Clinician Signature _______________________________________ Date __________  Please bring this form and your medication list with you to all your follow-up doctor's appointments.

## 2016-01-24 NOTE — Progress Notes (Signed)
Occupational Therapy Session Note  Patient Details  Name: Raymond Jacobson MRN: [id number] Date of Birth: 1940/07/14  Today's Date: 01/24/2016 OT Individual Time: [id number] OT Individual Time Calculation (min): 31 min     Short Term Goals: Week 1:  OT Short Term Goal 1 (Week 1): STG=LTG d/t short LOS  Skilled Therapeutic Interventions/Progress Updates:   Pt seen in afternoon session focusing on ADL retraining in kitchen setting with education on safety. Pt was in bathroom upon skilled OT arrival. Pt completed toileting tasks with supervision demonstrating good safety with hand placement. Afterwards, pt propelled to therapy kitchen and completed dynamic standing task while alternating between using L and R UE for support. Longest standing time without rest or LOB 6 minutes. Pt was provided education on safe walker placement for item retrieval in kitchen due to pt report of being an avid cook. Pt-therapist collaboration completed regarding environment modifications in home kitchen to maximize safety with meal prep. At end of session, pt self propelled back to room and was left in w/c with all needs within reach. No c/o pain. Positioning modification added to w/c to increase comfort with L residual limb.   Therapy Documentation Precautions:  Precautions Precautions: Fall Restrictions Weight Bearing Restrictions: Yes LLE Weight Bearing: Non weight bearing General:   Vital Signs: Therapy Vitals Temp: 98.6 F (37 C) Temp Source: Oral Pulse Rate: 73 Resp: 16 BP: 116/61 Patient Position (if appropriate): Sitting Oxygen Therapy SpO2: 98 % O2 Device: Not Delivered Pain: No c/o pain    ADL: ADL ADL Comments: See Functional Assessment Tool     See Function Navigator for Current Functional Status.   Therapy/Group: Individual Therapy  Hilliard Borges A Maudie Shingledecker 01/24/2016, 4:15 PM

## 2016-01-24 NOTE — Progress Notes (Signed)
Social Work Elease Hashimoto, LCSW Social Worker Signed   Patient Care Conference Date of Service: 01/22/2016 12:41 PM      Hide copied text Hover for attribution information Inpatient RehabilitationTeam Conference and Plan of Care Update Date: 01/22/2016   Time: 10:55 Am      Patient Name: Raymond Jacobson      Medical Record Number: [id number]  Date of Birth: 26-Nov-1940 Sex: Male         Room/Bed: 4M04C/4M04C-01 Payor Info: Payor: MEDICARE / Plan: MEDICARE PART A / Product Type: *No Product type* /     Admitting Diagnosis: fl BKA  Admit Date/Time:  01/17/2016  3:57 PM Admission Comments: No comment available    Primary Diagnosis:  Status post above knee amputation of left lower extremity (HCC) Principal Problem: Status post above knee amputation of left lower extremity [facility])       Patient Active Problem List    Diagnosis Date Noted  . Status post above knee amputation of left lower extremity ([place]) 01/17/2016  . Abdominal distension    . Unilateral AKA ([place])    . Coronary artery disease involving native coronary artery of native heart without angina pectoris    . 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 HLD (hyperlipidemia)    . Post-operative pain    . Acute blood loss anemia    . Right lower lobe pneumonia    . Diastolic dysfunction    . Tachypnea    . Tachycardia    . Hypokalemia    . Leukocytosis    . Thrombocytopenia ([place])    . Surgery, other elective    . Pain of upper abdomen    . Pneumothorax on right    . Chest trauma    . Acute respiratory failure ([place])    . Pre-operative cardiovascular examination 12/31/2015  . PAD (peripheral artery disease) ([place]) 10/15/2015  . ACS (acute coronary syndrome) ([place]) 06/20/2013  . HTN (hypertension) 04/13/2011  . Elevated lipids 04/13/2011  . CAD S/P multiple PCIs 04/13/2011      Expected Discharge Date: Expected Discharge Date: 01/25/16   Team Members Present: Physician leading conference: Dr. Alysia Penna Social Worker Present: Ovidio Kin, LCSW Nurse Present: Rayetta Pigg, RN PT Present: Georjean Mode, PT OT Present: Clyda Greener, OT SLP Present: Windell Moulding, SLP PPS Coordinator present : Daiva Nakayama, RN, CRRN       Current Status/Progress Goal Weekly Team Focus  Medical   ileus resolving, still with watery stools, still with poor balance, weak RLE  maintain wound healing  D/C planning   Bowel/Bladder   Cont x2; LBM 8-23; loose stools remain, no abdominal pain since Sunday; hyperactive bowel sounds  Remain cont x2  Continue monitoring bowels closely   Swallow/Nutrition/ Hydration             ADL's   min guard for toileting and walk-in shower transfers, supervision for all bathing and dressing sit to stand.  Still with dyspnea with exertion and decreased endurrance but O2 sats 93% or greater on room air.  modified indendent overall  selfcare re-training, transfer training, balance re-training, pt/family education, DME education, therapeutic exercise, energy conservation   Mobility   min A gait and transfers  mod I w/c, supervision gait household distances, supervision transfers  strength, balance, gait   Communication             Safety/Cognition/ Behavioral Observations  Pain   Little complaints of pain; scheduled tramadol, skips several doses   < 3 on 0-10 pain scale  Continue monitoring pain as therapy continues   Skin   L AKA with staples and L groin/thigh wounds  No new breakdown while on Rehab  Continue monitoring skin and wounds qshift       *See Care Plan and progress notes for long and short-term goals.   Barriers to Discharge: has had a couple near falls, sup goals   Possible Resolutions to Barriers:  Strength RLE   Discharge Planning/Teaching Needs:  Home with girlfriend who can provide supervision if needed. Pt wants to be mod/i level before he is discharged.      Team Discussion:  Goals downgraded to supervision since he will have this per  girlfriend. Ileus resolving-placed on soft diet today. Working on strength and balance issues. Pain being managed. Ready for stump shrinker. Work on discharge needs and arranging family education.  Revisions to Treatment Plan:  Downgraded goals to supervision level    Continued Need for Acute Rehabilitation Level of Care: The patient requires daily medical management by a physician with specialized training in physical medicine and rehabilitation for the following conditions: Daily direction of a multidisciplinary physical rehabilitation program to ensure safe treatment while eliciting the highest outcome that is of practical value to the patient.: Yes Daily medical management of patient stability for increased activity during participation in an intensive rehabilitation regime.: Yes Daily analysis of laboratory values and/or radiology reports with any subsequent need for medication adjustment of medical intervention for : Post surgical problems   Elease Hashimoto 01/22/2016, 12:41 PM      Elease Hashimoto, [place] Social Worker Signed   Patient Care Conference Date of Service: 01/22/2016 12:41 PM      Hide copied text Hover for attribution information Inpatient RehabilitationTeam Conference and Plan of Care Update Date: 01/22/2016   Time: 10:55 Am      Patient Name: Raymond Jacobson      Medical Record Number: [id number]  Date of Birth: [birth year] Sex: Male         Room/Bed: 4M04C/4M04C-01 Payor Info: Payor: MEDICARE / Plan: MEDICARE PART A / Product Type: *No Product type* /     Admitting Diagnosis: fl BKA  Admit Date/Time:  01/17/2016  3:57 PM Admission Comments: No comment available    Primary Diagnosis:  Status post above knee amputation of left lower extremity (HCC) Principal Problem: Status post above knee amputation of left lower extremity [facility])       Patient Active Problem List    Diagnosis Date Noted  . Status post above knee amputation of left lower extremity ([place]) 01/17/2016  .  Abdominal distension    . Unilateral AKA ([place])    . Coronary artery disease involving native coronary artery of native heart without angina pectoris    . 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 HLD (hyperlipidemia)    . Post-operative pain    . Acute blood loss anemia    . Right lower lobe pneumonia    . Diastolic dysfunction    . Tachypnea    . Tachycardia    . Hypokalemia    . Leukocytosis    . Thrombocytopenia ([place])    . Surgery, other elective    . Pain of upper abdomen    . Pneumothorax on right    . Chest trauma    . Acute respiratory failure ([place])    .  Pre-operative cardiovascular examination 12/31/2015  . PAD (peripheral artery disease) ([place]) 10/15/2015  . ACS (acute coronary syndrome) ([place]) 06/20/2013  . HTN (hypertension) 04/13/2011  . Elevated lipids 04/13/2011  . CAD S/P multiple PCIs 04/13/2011      Expected Discharge Date: Expected Discharge Date: 01/25/16   Team Members Present: Physician leading conference: Dr. Alysia Penna Social Worker Present: Ovidio Kin, LCSW Nurse Present: Rayetta Pigg, RN PT Present: Georjean Mode, PT OT Present: Clyda Greener, OT SLP Present: Windell Moulding, SLP PPS Coordinator present : Daiva Nakayama, RN, CRRN       Current Status/Progress Goal Weekly Team Focus  Medical   ileus resolving, still with watery stools, still with poor balance, weak RLE  maintain wound healing  D/C planning   Bowel/Bladder   Cont x2; LBM 8-23; loose stools remain, no abdominal pain since Sunday; hyperactive bowel sounds  Remain cont x2  Continue monitoring bowels closely   Swallow/Nutrition/ Hydration             ADL's   min guard for toileting and walk-in shower transfers, supervision for all bathing and dressing sit to stand.  Still with dyspnea with exertion and decreased endurrance but O2 sats 93% or greater on room air.  modified indendent overall  selfcare re-training, transfer training, balance re-training, pt/family  education, DME education, therapeutic exercise, energy conservation   Mobility   min A gait and transfers  mod I w/c, supervision gait household distances, supervision transfers  strength, balance, gait   Communication             Safety/Cognition/ Behavioral Observations           Pain   Little complaints of pain; scheduled tramadol, skips several doses   < 3 on 0-10 pain scale  Continue monitoring pain as therapy continues   Skin   L AKA with staples and L groin/thigh wounds  No new breakdown while on Rehab  Continue monitoring skin and wounds qshift       *See Care Plan and progress notes for long and short-term goals.   Barriers to Discharge: has had a couple near falls, sup goals   Possible Resolutions to Barriers:  Strength RLE   Discharge Planning/Teaching Needs:  Home with girlfriend who can provide supervision if needed. Pt wants to be mod/i level before he is discharged.      Team Discussion:  Goals downgraded to supervision since he will have this per girlfriend. Ileus resolving-placed on soft diet today. Working on strength and balance issues. Pain being managed. Ready for stump shrinker. Work on discharge needs and arranging family education.  Revisions to Treatment Plan:  Downgraded goals to supervision level    Continued Need for Acute Rehabilitation Level of Care: The patient requires daily medical management by a physician with specialized training in physical medicine and rehabilitation for the following conditions: Daily direction of a multidisciplinary physical rehabilitation program to ensure safe treatment while eliciting the highest outcome that is of practical value to the patient.: Yes Daily medical management of patient stability for increased activity during participation in an intensive rehabilitation regime.: Yes Daily analysis of laboratory values and/or radiology reports with any subsequent need for medication adjustment of medical intervention for : Post  surgical problems   Elease Hashimoto 01/22/2016, 12:41 PM       Patient ID: Raymond Jacobson, male   DOB: 23-Nov-1940, 75 y.o.   MRN: [id number]

## 2016-01-24 NOTE — Progress Notes (Signed)
Social Work Patient ID: Raymond Jacobson, male   DOB: 07-31-1940, 75 y.o.   MRN: [id number]  Pt may want a padded tub bench with cut-out since he feels he may need this instead of just a tub bench like his daughter bought him. Have told him the cost of $170.00 he will think about this. He will get back with this worker if he decides he wants one.

## 2016-01-25 DIAGNOSIS — I10 Essential (primary) hypertension: Secondary | ICD-10-CM

## 2016-01-25 DIAGNOSIS — R7303 Prediabetes: Secondary | ICD-10-CM

## 2016-01-25 LAB — GLUCOSE, CAPILLARY: Glucose-Capillary: 100 mg/dL — ABNORMAL HIGH (ref 65–99)

## 2016-01-25 NOTE — Progress Notes (Signed)
Patient discharged to wife's home, accompaqnied by wife.

## 2016-01-25 NOTE — Progress Notes (Signed)
Subjective/Complaints: Pt sitting up in bed.  He states he is ready to go home.   ROS- Denies CP/SOB, -N/V/D  Objective: Vital Signs: Blood pressure (!) 153/78, pulse 76, temperature 98.6 F (37 C), temperature source Oral, resp. rate 17, height 5' 8"  (1.727 m), weight 83.9 kg (185 lb), SpO2 96 %. No results found. Results for orders placed or performed during the hospital encounter of 01/17/16 (from the past 72 hour(s))  Glucose, capillary     Status: Abnormal   Collection Time: 01/22/16 11:25 AM  Result Value Ref Range   Glucose-Capillary 151 (H) 65 - 99 mg/dL  Glucose, capillary     Status: Abnormal   Collection Time: 01/22/16  4:09 PM  Result Value Ref Range   Glucose-Capillary 127 (H) 65 - 99 mg/dL  Glucose, capillary     Status: Abnormal   Collection Time: 01/22/16  8:32 PM  Result Value Ref Range   Glucose-Capillary 150 (H) 65 - 99 mg/dL   Comment 1 Notify RN   Glucose, capillary     Status: Abnormal   Collection Time: 01/23/16  6:39 AM  Result Value Ref Range   Glucose-Capillary 107 (H) 65 - 99 mg/dL   Comment 1 Notify RN   Basic metabolic panel     Status: Abnormal   Collection Time: 01/23/16  8:22 AM  Result Value Ref Range   Sodium 132 (L) 135 - 145 mmol/L   Potassium 4.0 3.5 - 5.1 mmol/L   Chloride 100 (L) 101 - 111 mmol/L   CO2 22 22 - 32 mmol/L   Glucose, Bld 151 (H) 65 - 99 mg/dL   BUN <5 (L) 6 - 20 mg/dL   Creatinine, Ser 0.94 0.61 - 1.24 mg/dL   Calcium 8.0 (L) 8.9 - 10.3 mg/dL   GFR calc non Af Amer >60 >60 mL/min   GFR calc Af Amer >60 >60 mL/min    Comment: (NOTE) The eGFR has been calculated using the CKD EPI equation. This calculation has not been validated in all clinical situations. eGFR's persistently <60 mL/min signify possible Chronic Kidney Disease.    Anion gap 10 5 - 15  Glucose, capillary     Status: Abnormal   Collection Time: 01/23/16 11:26 AM  Result Value Ref Range   Glucose-Capillary 180 (H) 65 - 99 mg/dL  Glucose, capillary      Status: Abnormal   Collection Time: 01/23/16  4:39 PM  Result Value Ref Range   Glucose-Capillary 126 (H) 65 - 99 mg/dL  Glucose, capillary     Status: Abnormal   Collection Time: 01/23/16  8:39 PM  Result Value Ref Range   Glucose-Capillary 141 (H) 65 - 99 mg/dL   Comment 1 Notify RN   Glucose, capillary     Status: Abnormal   Collection Time: 01/24/16  6:32 AM  Result Value Ref Range   Glucose-Capillary 111 (H) 65 - 99 mg/dL   Comment 1 Notify RN   Glucose, capillary     Status: Abnormal   Collection Time: 01/24/16 11:43 AM  Result Value Ref Range   Glucose-Capillary 126 (H) 65 - 99 mg/dL  Glucose, capillary     Status: Abnormal   Collection Time: 01/24/16  4:28 PM  Result Value Ref Range   Glucose-Capillary 103 (H) 65 - 99 mg/dL  Glucose, capillary     Status: Abnormal   Collection Time: 01/24/16  8:31 PM  Result Value Ref Range   Glucose-Capillary 171 (H) 65 - 99 mg/dL  Comment 1 Notify RN   Glucose, capillary     Status: Abnormal   Collection Time: 01/25/16  6:28 AM  Result Value Ref Range   Glucose-Capillary 100 (H) 65 - 99 mg/dL   Comment 1 Notify RN      HEENT: normocephalic, atraumatic Cardio: RRR and no murmur Resp: CTA B/L and unlabored GI: BS positive and NT, ND, Normal bowel sounds Skin:   Left AKA incision CDI with erythema along medial stapes, no drainge, medial thigh incision (from re vasculariztion surgery, sm amt serosang drainage (unchanged) Neuro: Alert/Oriented  Motor 5/5 in BUE and RLE, 4/5 L HF Musc/Skel:  Edema at stump.  Mild tenderness. Gen NAD. Vital signs reviewed   Assessment/Plan: 1. 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 Physiatrist is providing close team supervision and 24 hour management of active medical problems listed below. Physiatrist and rehab team continue to assess barriers to discharge/monitor patient progress toward  functional and medical goals. FIM: Function - Bathing Position: Shower Body parts bathed by patient: Right arm, Left arm, Chest, Abdomen, Front perineal area, Right upper leg, Left upper leg, Back, Buttocks, Right lower leg Body parts bathed by helper: Back Bathing not applicable: Left lower leg Assist Level: Supervision or verbal cues  Function- Upper Body Dressing/Undressing What is the patient wearing?: Pull over shirt/dress Pull over shirt/dress - Perfomed by patient: Thread/unthread right sleeve, Thread/unthread left sleeve, Put head through opening, Pull shirt over trunk Assist Level: More than reasonable time Set up : To obtain clothing/put away Function - Lower Body Dressing/Undressing What is the patient wearing?: Pants, Shoes Position: Sitting EOB Pants- Performed by patient: Thread/unthread right pants leg, Thread/unthread left pants leg, Pull pants up/down Pants- Performed by helper: Thread/unthread left pants leg, Pull pants up/down Non-skid slipper socks- Performed by helper: Don/doff right sock Shoes - Performed by patient: Don/doff right shoe Assist for lower body dressing: Supervision or verbal cues  Function - Toileting Toileting activity did not occur: Refused (difficult to pass stool sitting on toilet, GI issues) Toileting steps completed by patient: Adjust clothing prior to toileting, Performs perineal hygiene, Adjust clothing after toileting Toileting steps completed by helper: Adjust clothing prior to toileting, Adjust clothing after toileting, Performs perineal hygiene Toileting Assistive Devices: Grab bar or rail Assist level: Supervision or verbal cues  Function - Air cabin crew transfer activity did not occur: Refused Toilet transfer assistive device: Elevated toilet seat/BSC over toilet Assist level to toilet: Supervision or verbal cues Assist level from toilet: Supervision or verbal cues  Function - Chair/bed transfer Chair/bed transfer  method: Stand pivot Chair/bed transfer assist level: Supervision or verbal cues Chair/bed transfer assistive device: Walker Chair/bed transfer details: Verbal cues for technique  Function - Locomotion: Wheelchair Will patient use wheelchair at discharge?: Yes Type: Manual Max wheelchair distance: 150 Assist Level: No help, No cues, assistive device, takes more than reasonable amount of time Assist Level: No help, No cues, assistive device, takes more than reasonable amount of time Assist Level: No help, No cues, assistive device, takes more than reasonable amount of time Turns around,maneuvers to table,bed, and toilet,negotiates 3% grade,maneuvers on rugs and over doorsills: Yes Function - Locomotion: Ambulation Assistive device: Walker-rolling Max distance: 50 Assist level: Supervision or verbal cues Assist level: Supervision or verbal cues Walk 50 feet with 2 turns activity did not occur: Safety/medical concerns Assist level: Supervision or verbal cues Walk 150 feet activity did not occur:  Safety/medical concerns Walk 10 feet on uneven surfaces activity did not occur: Safety/medical concerns Assist level: Touching or steadying assistance (Pt > 75%)  Function - Comprehension Comprehension: Auditory Comprehension assist level: Follows complex conversation/direction with no assist  Function - Expression Expression: Verbal Expression assist level: Expresses complex ideas: With no assist  Function - Social Interaction Social Interaction assist level: Interacts appropriately with others - No medications needed.  Function - Problem Solving Problem solving assist level: Solves complex problems: Recognizes & self-corrects  Function - Memory Memory assist level: Complete Independence: No helper Patient normally able to recall (first 3 days only): Current season, Location of own room, Staff names and faces, That he or she is in a hospital  Medical Problem List and Plan: 1.   Functional and mobility deficits secondary to left above knee amputation  D/c today 2.  A Fib/Saddle PE/Anticoagulation: Pharmaceutical: Pradexa 3. Pain Management: Neuropathy resolving and pain controlled with prn medications. D/Ced oxycodone due to ileus, tolerating therapy 4. Mood: LCSW to follow for evaluation and support.  5. Neuropsych: This patient is capable of making decisions on his own behalf. 6. Skin/Wound Care: Routine pressure relief measures. 7. Fluids/Electrolytes/Nutrition: Strict I/O.  8. CAD: Off imdur and BB. Imdur discontinued   9. HTN:  Bp medications discontinued due to hypotension, now trending up.    Will need follow up as outpt for further adjustments Vitals:   01/24/16 1245 01/25/16 0552  BP: 116/61 (!) 153/78  Pulse: 73 76  Resp: 16 17  Temp: 98.6 F (37 C) 98.6 F (37 C)   10. Acute renal failure: Resolved with hydration and medication changes--Likely due to hypoperfusion with BP 80-90s for 48 hours.  11.  Hypokalemia: continues to be an issues --Encourage adequate intake to help manage electrolyte abnormality. increased supplement  12. ABLA: Monitor H/H and for any signs of bleeding with pradexa on board. Added iron supplement.  13. Prediabetes: On Carb modified diet--will monitor BS ac/hs and use SSI for elevated BS.  CBG (last 3)   Recent Labs  01/24/16 1628 01/24/16 2031 01/25/16 0628  GLUCAP 103* 171* 100*  14. Hypoalbuminemia- started prostat 15.  Ileus recurrent, resolved, tolerating regular diet  LOS (Days) 8 A FACE TO FACE EVALUATION WAS PERFORMED  Anjanette Gilkey Lorie Phenix 01/25/2016, 7:46 AM

## 2016-01-27 NOTE — Discharge Summary (Signed)
Physician Discharge Summary  Patient ID: Raymond Jacobson MRN: [id number] DOB/AGE: 76-Mar-1942 76 y.o.  Admit date: 01/17/2016 Discharge date: 01/25/2016  Discharge Diagnoses:  Principal Problem:   Status post above knee amputation of left lower extremity (HCC) Active Problems:   PAD (peripheral artery disease) (HCC)   Abdominal distension   Acute blood loss anemia   Diastolic dysfunction   Prediabetes   Benign essential HTN   Discharged Condition: stable  Significant Diagnostic Studies:  Dg Chest Port 1 View  Result Date: 01/19/2016 CLINICAL DATA:  Abdominal pain and diarrhea. EXAM: PORTABLE CHEST 1 VIEW COMPARISON:  01/13/2016 FINDINGS: Midline trachea. Cardiomegaly accentuated by AP portable technique. Atherosclerosis in the transverse aorta. Mild right hemidiaphragm elevation. No pleural effusion or pneumothorax. Low lung volumes with resultant pulmonary interstitial prominence. No congestive failure. Prominent gas-filled bowel in the upper abdomen is likely colonic. IMPRESSION: Cardiomegaly and low lung volumes, without acute disease. Aortic atherosclerosis. Electronically Signed   By: Abigail Miyamoto M.D.   On: 01/19/2016 15:17    Dg Abd Portable 1v  Result Date: 01/19/2016 CLINICAL DATA:  Generalized abdominal pain with diarrhea EXAM: PORTABLE ABDOMEN - 1 VIEW COMPARISON:  January 17, 2016 FINDINGS: Generalized bowel dilatation is again noted. No free air is evident on this supine examination. There is an apparent phlebolith in the right pelvis. IMPRESSION: Generalized bowel dilatation. Suspect ileus, although a degree of distal bowel obstruction cannot be excluded. No free air evident on this supine examination. Appearance is similar to 2 days prior. Electronically Signed   By: Lowella Grip III M.D.   On: 01/19/2016 15:18   Dg Abd Portable 1v  Result Date: 01/17/2016 CLINICAL DATA:  Follow-up ileus EXAM: PORTABLE ABDOMEN - 1 VIEW COMPARISON:  01/10/2016 FINDINGS: There is  significant gaseous distension of the stomach with progression from prior exam highly suspicious for gastroparesis or ileus. Persistent diffuse colonic distension consistent with colonic ileus or partial colonic obstruction. IMPRESSION: There is significant gaseous distension of the stomach with progression from prior exam highly suspicious for gastroparesis or ileus. Persistent diffuse colonic distension consistent with colonic ileus or partial colonic obstruction. Electronically Signed   By: Lahoma Crocker M.D.   On: 01/17/2016 18:58     Labs:  Basic Metabolic Panel:  Recent Labs Lab 01/20/16 2204 01/22/16 0542 01/23/16 0822  NA 130*  --  132*  K 3.9  --  4.0  CL 100*  --  100*  CO2 26  --  22  GLUCOSE 119*  --  151*  BUN 6  --  <5*  CREATININE 0.92 0.83 0.94  CALCIUM 7.7*  --  8.0*    CBC: CBC Latest Ref Rng & Units 01/18/2016 01/17/2016 01/16/2016  WBC 4.0 - 10.5 K/uL 6.8 7.8 9.4  Hemoglobin 13.0 - 17.0 g/dL 9.1(L) 9.1(L) 9.2(L)  Hematocrit 39.0 - 52.0 % 27.0(L) 28.4(L) 28.5(L)  Platelets 150 - 400 K/uL 188 209 186    CBG:  Recent Labs Lab 01/24/16 0632 01/24/16 1143 01/24/16 1628 01/24/16 2031 01/25/16 0628  GLUCAP 111* 126* 103* 171* 100*    Brief HPI:   Raymond Jacobson a 75 y.o.malewith history of CAD, GERD, A fib, cough, OA,  PVD with  claudication of the LLE affecting QOL. He was admitted on 01/01/16 for  left femoral endarterectomy and left femoral to above knee popliteal bypass by Dr. Oneida Alar. Post op developed ischemia due to occlusion of graft and was taken back to OR that afternoon and repeat procedure on 08/07 for thrombectomy. He  continued to have severe progressive ischemia and AKA recommended due to lack of options. On 08/08 patient underwent L-AKA without complications.  Post op course significant for ongoing confusion,  abdominal distension due to ileus requiring NGT as well as large R- tension PTX treated with CT, fluid overload requiring diuresis and  developed progressive dyspnea due to saddle PE with large clot burden. Ileus resolving but he developed acute renal failure with hypotension requiring IVF for hydration and d/c of BP meds.  His anxiety levels are improving but he continues to have dyspnea/hypoxia with activity. Therapy ongoing and CIR recommended by MD and rehab team.     Hospital Course: [name] was admitted to rehab 01/17/2016 for inpatient therapies to consist of PT and OT at least three hours five days a week. Past admission physiatrist, therapy team and rehab RN have worked together to provide customized collaborative inpatient rehab. Follow up KUB was done after admission due to abdominal distension and showed evidence of recurrent ileus. He was place was placed on liquid diet and diet was slowly advanced to soft without recurrent symptoms.Oxycodone was discontinued and pain has been controlled with prn use of tramadol.  Blood pressures have slowly stabilized and continue to be held.  Check of lytes showed that renal insufficiency has resolved but he continued to have persistent hypokalemia needing aggressive supplementation.  Follow up labs prior to discharge revealed that hypokalemia has been adequately supplemented and resolved. Mild hyponatremia is stable. Follow up CBC shows that reactive leucocytosis has resolved and H/H is stable. AKA incision is intact with staples in place and mild erythema noted. Groin wound is macerated appearing and patient has been educated on  local hygiene with daily dry dressing changes.   His mentation has gradually improved and is modified independent at wheelchair level. He will continue to receive follow up HHPT and HHRN by [facility] after discharge.    Rehab course: During patient's stay in rehab weekly team conferences were held to monitor patient's progress, set goals and discuss barriers to discharge. At admission, patient required moderate assist with ADL tasks and min to moderate assit  with mobility. He has had improvement in activity tolerance, balance, postural control, as well as ability to compensate for deficits.  He is able to complete bathing and upper dressing with supervision and required in assist for LB dressing due to need to stand.   He is able to stand for 6 minutes without LOB. His girlfriend has been supportive and attended many therapy sessions. He is able to perform transfers at modified independent level and is ambulating 55' with RW and supervision.  Family education was completed regarding all aspects of care, energy conservation measures and safety.    Disposition: 01-Home or Self Care  Diet:  Regular   Special Instructions: 1. Needs recheck CBC/BMET in one week.    Discharge Instructions    Ambulatory referral to Physical Medicine Rehab    Complete by:  As directed   Moderate complexity. 1-2 weeks left AKA        Medication List    STOP taking these medications   chlorthalidone 25 MG tablet Commonly known as:  HYGROTON   diphenoxylate-atropine 2.5-0.025 MG tablet Commonly known as:  LOMOTIL   enalapril 20 MG tablet Commonly known as:  VASOTEC   isosorbide mononitrate 30 MG 24 hr tablet Commonly known as:  IMDUR   NIFEdipine 90 MG 24 hr tablet Commonly known as:  ADALAT CC   OVER THE COUNTER  MEDICATION     TAKE these medications   atorvastatin 40 MG tablet Commonly known as:  LIPITOR Take 1 tablet (40 mg total) by mouth every evening. What changed:  medication strength  how much to take   dabigatran 150 MG Caps capsule Commonly known as:  PRADAXA Take 1 capsule (150 mg total) by mouth 2 (two) times daily.   iron polysaccharides 150 MG capsule Commonly known as:  NIFEREX Take 1 capsule (150 mg total) by mouth 2 times daily at 12 noon and 4 pm.   methocarbamol 500 MG tablet Commonly known as:  ROBAXIN Take 1 tablet (500 mg total) by mouth every 6 (six) hours as needed for muscle spasms.   metoprolol succinate 100 MG  24 hr tablet Commonly known as:  TOPROL-XL Take 1 tablet (100 mg total) by mouth daily. Take with or immediately following a meal. What changed:  medication strength  additional instructions   niacin 750 MG CR tablet Commonly known as:  NIASPAN Take 1 tablet (750 mg total) by mouth at bedtime.   nitroGLYCERIN 0.4 MG SL tablet Commonly known as:  NITROSTAT Place 1 tablet (0.4 mg total) under the tongue every 5 (five) minutes as needed for chest pain.   pantoprazole 40 MG tablet Commonly known as:  PROTONIX Take 1 tablet (40 mg total) by mouth daily.   traMADol 50 MG tablet--Rx 60 pills  Commonly known as:  ULTRAM Take 1 tablet (50 mg total) by mouth every 6 (six) hours as needed for moderate pain.       Follow-up Information    Charlett Blake, MD .   Specialty:  Physical Medicine and Rehabilitation Contact information: [place] [address] [phone]        Ruta Hinds, MD .   Specialties:  Vascular Surgery, Cardiology Why:  Call for appointment 2 weeks Contact information: [place] [address] [phone]        [facility] .   Why:  Patient to make follow up appointment. Contact information: [place] [address] [phone]           Signed: Bary Leriche 01/27/2016, 12:01 PM

## 2016-01-28 ENCOUNTER — Encounter: Payer: Self-pay | Admitting: Vascular Surgery

## 2016-01-28 ENCOUNTER — Telehealth: Payer: Self-pay | Admitting: Vascular Surgery

## 2016-01-28 NOTE — Telephone Encounter (Signed)
Sched appt 02/06/16 at 1:00. Spoke to pt to inform them of appt.

## 2016-01-28 NOTE — Telephone Encounter (Signed)
-----   Message from Mena Goes, RN sent at 01/28/2016  9:13 AM EDT ----- Regarding: schedule for 02-06-16   ----- Message ----- From: Alvia Grove, PA-C Sent: 01/28/2016   8:40 AM To: [facility]vs Charge Pool  S/p left BKA 01/07/16  F/u with Dr. Oneida Alar next Thursday 02/06/16  Thanks Maudie Mercury

## 2016-01-31 ENCOUNTER — Other Ambulatory Visit: Payer: Self-pay | Admitting: Family

## 2016-01-31 ENCOUNTER — Encounter: Payer: Self-pay | Admitting: Family

## 2016-01-31 ENCOUNTER — Encounter (HOSPITAL_COMMUNITY): Payer: Self-pay | Admitting: Vascular Surgery

## 2016-01-31 ENCOUNTER — Ambulatory Visit (INDEPENDENT_AMBULATORY_CARE_PROVIDER_SITE_OTHER): Payer: Medicare Other | Admitting: Family

## 2016-01-31 ENCOUNTER — Emergency Department (HOSPITAL_COMMUNITY)
Admission: EM | Admit: 2016-01-31 | Discharge: 2016-01-31 | Disposition: A | Discharge status: Home / Self Care | Payer: Non-veteran care | Attending: Emergency Medicine | Admitting: Emergency Medicine

## 2016-01-31 ENCOUNTER — Telehealth: Payer: Self-pay

## 2016-01-31 VITALS — BP 88/53 | HR 69 | Temp 98.6°F | Resp 16 | Ht 68.0 in | Wt 178.0 lb

## 2016-01-31 DIAGNOSIS — Z85828 Personal history of other malignant neoplasm of skin: Secondary | ICD-10-CM | POA: Insufficient documentation

## 2016-01-31 DIAGNOSIS — Z89612 Acquired absence of left leg above knee: Secondary | ICD-10-CM

## 2016-01-31 DIAGNOSIS — T8131XA Disruption of external operation (surgical) wound, not elsewhere classified, initial encounter: Secondary | ICD-10-CM | POA: Diagnosis not present

## 2016-01-31 DIAGNOSIS — I2510 Atherosclerotic heart disease of native coronary artery without angina pectoris: Secondary | ICD-10-CM | POA: Diagnosis not present

## 2016-01-31 DIAGNOSIS — I10 Essential (primary) hypertension: Secondary | ICD-10-CM | POA: Diagnosis not present

## 2016-01-31 DIAGNOSIS — Z87891 Personal history of nicotine dependence: Secondary | ICD-10-CM | POA: Insufficient documentation

## 2016-01-31 DIAGNOSIS — Y828 Other medical devices associated with adverse incidents: Secondary | ICD-10-CM | POA: Diagnosis not present

## 2016-01-31 DIAGNOSIS — Z79899 Other long term (current) drug therapy: Secondary | ICD-10-CM | POA: Insufficient documentation

## 2016-01-31 DIAGNOSIS — Z955 Presence of coronary angioplasty implant and graft: Secondary | ICD-10-CM | POA: Insufficient documentation

## 2016-01-31 DIAGNOSIS — T814XXA Infection following a procedure, initial encounter: Secondary | ICD-10-CM

## 2016-01-31 DIAGNOSIS — I779 Disorder of arteries and arterioles, unspecified: Secondary | ICD-10-CM

## 2016-01-31 DIAGNOSIS — R5081 Fever presenting with conditions classified elsewhere: Secondary | ICD-10-CM

## 2016-01-31 DIAGNOSIS — IMO0001 Reserved for inherently not codable concepts without codable children: Secondary | ICD-10-CM

## 2016-01-31 DIAGNOSIS — I959 Hypotension, unspecified: Secondary | ICD-10-CM

## 2016-01-31 LAB — URINALYSIS, ROUTINE W REFLEX MICROSCOPIC
BILIRUBIN URINE: NEGATIVE
GLUCOSE, UA: NEGATIVE mg/dL
Hgb urine dipstick: NEGATIVE
KETONES UR: NEGATIVE mg/dL
LEUKOCYTES UA: NEGATIVE
NITRITE: NEGATIVE
PH: 6 (ref 5.0–8.0)
PROTEIN: NEGATIVE mg/dL
Specific Gravity, Urine: 1.019 (ref 1.005–1.030)

## 2016-01-31 LAB — COMPREHENSIVE METABOLIC PANEL
ALBUMIN: 2.8 g/dL — AB (ref 3.5–5.0)
ALK PHOS: 92 U/L (ref 38–126)
ALT: 20 U/L (ref 17–63)
AST: 24 U/L (ref 15–41)
Anion gap: 10 (ref 5–15)
BILIRUBIN TOTAL: 0.3 mg/dL (ref 0.3–1.2)
BUN: 11 mg/dL (ref 6–20)
CO2: 25 mmol/L (ref 22–32)
CREATININE: 1.04 mg/dL (ref 0.61–1.24)
Calcium: 8.3 mg/dL — ABNORMAL LOW (ref 8.9–10.3)
Chloride: 93 mmol/L — ABNORMAL LOW (ref 101–111)
GFR calc Af Amer: >60 mL/min (ref >60)
GLUCOSE: 128 mg/dL — AB (ref 65–99)
Potassium: 3.1 mmol/L — ABNORMAL LOW (ref 3.5–5.1)
Sodium: 128 mmol/L — ABNORMAL LOW (ref 135–145)
TOTAL PROTEIN: 5.7 g/dL — AB (ref 6.5–8.1)

## 2016-01-31 LAB — CBC WITH DIFFERENTIAL/PLATELET
BASOS ABS: 0 10*3/uL (ref 0.0–0.1)
BASOS PCT: 0 %
EOS PCT: 6 %
Eosinophils Absolute: 0.4 10*3/uL (ref 0.0–0.7)
HEMATOCRIT: 29.3 % — AB (ref 39.0–52.0)
Hemoglobin: 9.2 g/dL — ABNORMAL LOW (ref 13.0–17.0)
Lymphocytes Relative: 15 %
Lymphs Abs: 0.9 10*3/uL (ref 0.7–4.0)
MCH: 27.6 pg (ref 26.0–34.0)
MCHC: 31.4 g/dL (ref 30.0–36.0)
MCV: 88 fL (ref 78.0–100.0)
MONO ABS: 0.5 10*3/uL (ref 0.1–1.0)
MONOS PCT: 8 %
NEUTROS ABS: 4.5 10*3/uL (ref 1.7–7.7)
Neutrophils Relative %: 71 %
PLATELETS: 141 10*3/uL — AB (ref 150–400)
RBC: 3.33 MIL/uL — ABNORMAL LOW (ref 4.22–5.81)
RDW: 15 % (ref 11.5–15.5)
WBC: 6.3 10*3/uL (ref 4.0–10.5)

## 2016-01-31 LAB — I-STAT CG4 LACTIC ACID, ED: LACTIC ACID, VENOUS: 1.87 mmol/L (ref 0.5–1.9)

## 2016-01-31 MED ORDER — SULFAMETHOXAZOLE-TRIMETHOPRIM 800-160 MG PO TABS
1.0000 | ORAL_TABLET | Freq: Once | ORAL | Status: AC
  Administered 2016-01-31: 1 via ORAL
  Filled 2016-01-31: qty 1

## 2016-01-31 MED ORDER — SULFAMETHOXAZOLE-TRIMETHOPRIM 800-160 MG PO TABS: 1.0000 | ORAL_TABLET | Freq: Two times a day (BID) | ORAL | 0 refills | Status: AC

## 2016-01-31 MED ORDER — SODIUM CHLORIDE 0.9 % IV BOLUS (SEPSIS)
1000.0000 mL | Freq: Once | INTRAVENOUS | Status: AC
  Administered 2016-01-31: 1000 mL via INTRAVENOUS

## 2016-01-31 NOTE — Telephone Encounter (Signed)
Phone call from pt's [facility] RN.  Reported the medial side of left AKA stump incision has a pink-red color at the corner and just above the incision line.  Reported the staples are intact, but the incision appears to be starting to pull apart @ the medial side.  Stated there is sero-sang. drainage noted @ the stump site, and from the BP incisions.  Reported the pt. C/o not feeling well yesterday and of fever of 100.5.  Reported no fever today.  Discussed with NP; advised to bring in for appt. today.

## 2016-01-31 NOTE — ED Triage Notes (Signed)
Pt reports to the ED for eval of possible infection to the left amputation site. Pt had a BKA done on 8/8 and was just released on 8/18. Pts home health RN came by and examined the room and referred him to Dr. Oneida Alar who examined it and noted some erythema and was able to get some purulent drainage from the site. Pt did have a temperature of 100.4 yesterday. Afebrile at this time.

## 2016-01-31 NOTE — Progress Notes (Signed)
    Postoperative Visit   History of Present Illness  Raymond Jacobson is a 75 y.o. male who is s/p three revascularization attempts starting on 01/01/16 with left femoral endarterectomy, Left femoral to above-knee popliteal bypass with right greater saphenous vein non reversed for claudication of left leg. 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   He returns today after phone call from pt's [facility] RN.  Reported the medial side of left AKA stump incision has a pink-red color at the corner and just above the incision line.  Reported the staples are intact, but the incision appears to be starting to pull apart @ the medial side.  Stated there is sero-sang. drainage noted @ the stump site, and from the BP incisions.  Reported the pt. C/o not feeling well yesterday and of fever of 100.5.  Reported no fever today.  He states he is moving his bowels.  During his hospitalization he required a chest tube, states he had pneumonia, possibly a pneumothorax.   His blood pressure was 112/66 on 12/31/15 when he saw Raymond Ransom, PA-C for cardiac risk stratification before his left leg revascularization attempts. His blood pressure in [facility] on 01/20/16 was 151/78.  He was discharged on 01/25/16. Blood pressure on 01/20/16 at [facility] was 151/78.   He states he takes Pradaxa for the 4 cardiac stents he has.    For VQI Use Only  PRE-ADM LIVING: Home  AMB STATUS: Wheelchair  Physical Examination  Vitals:   01/31/16 1505  BP: (!) 88/53  Pulse: 69  Resp: 16  Temp: 98.6 F (37 C)  TempSrc: Oral  SpO2: 98%  Weight: 178 lb (80.7 kg)  Height: 5\' 8"  (1.727 m)   Body mass index is 27.06 kg/m.   Medical Decision Making  Raymond Jacobson is a 75 y.o. male who presents s/p left above-the-knee amputation on 8/8/7. I spoke with Dr. Donzetta Matters. He is hypotensive, but not sympomatic of hypotension, he had a fever with diaphoresis yesterday, and one of the incisions at his left AKA stump is draining purulent tinged serosanguinous  drainage when expressed, wound culture obtained of this. He has mild to moderate degree of erythema at his left groin and icisions along the media aspect of his left thigh. He also has swelling, erythema, and a small amount of necrosis at the medial aspect of his left AKA incision.  He also is draining thin serosanguinous drainage from a punctate area in his left groin, possibly a lymphocele.  Advised wife wife to drive him to [facility] ED for sepsis work up.  I spoke with Valarie Merino, triage nurse at [facility] ED, gave him report, and that sepsis work up is requested.    Alajiah Dutkiewicz, Sharmon Leyden, RN, MSN, FNP-C [facility] Office: [phone]  01/31/2016, 3:12 PM  Clinic MD: Donzetta Matters on call

## 2016-01-31 NOTE — ED Provider Notes (Signed)
[place] DEPT Provider Note   CSN: [id number] Arrival date & time: 01/31/16  [id number]     History   Chief Complaint Chief Complaint  Patient presents with  . Post-op Problem    HPI Raymond Jacobson is a 75 y.o. male.  The history is provided by the patient and the spouse.   CC: stump wound redness  Onset/Duration: 1 day Timing: constant Location: left leg stump Quality: redness Severity: mild Modifying Factors:  Improved by: nothing  Worsened by: nothing Associated Signs/Symptoms:  Pertinent (+): serosanguinous drainage, Temp of 100.5, chills; possible purulent expression by home RN.  Pertinent (-): pain Context: Patient with a history of claudication status post unsuccessful bypass requiring left lower extremity AKA. Recently discharged from the hospital. Hospital stay was complicated by hypertension and respiratory distress.   Sent here by home RN with concern for wound infection.   Past Medical History:  Diagnosis Date  . Angina    last time >yr  . Arthritis    "knees, ankles" (10/15/2015)  . Basal cell carcinoma of scalp    "froze off"  . CAD (coronary artery disease)    Previous stents last 6 years ago [place] [facility]  . Complication of anesthesia   . Cough    recent bronchitis- dry cough  . Dry cough   . DVT (deep venous thrombosis) (HCC) ~ 1969   LLE  . Elevated lipids   . GERD (gastroesophageal reflux disease)    occ -tums  . High cholesterol   . History of hiatal hernia    hx  . Hypertension   . Myocardial infarction [facility]) ~ 2006   "blood work"  . Peripheral vascular disease ([place])   . PONV (postoperative nausea and vomiting)   . Shortness of breath 04/13/11   "mostly w/exertion"/ now with bronchitis    Patient Active Problem List   Diagnosis Date Noted  . Prediabetes   . Benign essential HTN   . Status post above knee amputation of left lower extremity ([place]) 01/17/2016  . Abdominal distension   . 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 Coronary artery  disease involving native coronary artery of native heart without angina pectoris   . 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 HLD (hyperlipidemia)   . Post-operative pain   . Acute blood loss anemia   . Right lower lobe pneumonia   . Diastolic dysfunction   . Tachypnea   . Tachycardia   . Hypokalemia   . Leukocytosis   . Thrombocytopenia ([place])   . Surgery, other elective   . Pain of upper abdomen   . Pneumothorax on right   . Chest trauma   . Acute respiratory failure ([place])   . Pre-operative cardiovascular examination 12/31/2015  . PAD (peripheral artery disease) ([place]) 10/15/2015  . ACS (acute coronary syndrome) ([place]) 06/20/2013  . HTN (hypertension) 04/13/2011  . Elevated lipids 04/13/2011  . CAD S/P multiple PCIs 04/13/2011    Past Surgical History:  Procedure Laterality Date  . AMPUTATION Left 01/07/2016   Procedure: AMPUTATION LEFT LEG  ABOVE KNEE;  Surgeon: Elam Dutch, MD;  Location: [place];  Service: Vascular;  Laterality: Left;  . CARPAL TUNNEL RELEASE Right 1990s  . CORONARY ANGIOPLASTY WITH STENT PLACEMENT  ~ 2004; ~ 2006; 04/13/11   "# of stents 1 (i2004);+2 (2006)+ 1 (04/13/11)= 4 total"  . ENDARTERECTOMY FEMORAL Left 01/01/2016   Procedure: LEFT FEMORAL ARTERY  ENDARTERECTOMY ;  Surgeon: Elam Dutch, MD;  Location: [facility] OR;  Service: Vascular;  Laterality: Left;  . FEMORAL ARTERY STENT Left 10/15/2015  . FEMORAL-POPLITEAL BYPASS GRAFT Left 01/01/2016   Procedure: Thrombectomy of Left Leg Femoral-Popliteal Bypass Graft;  Surgeon: Elam Dutch, MD;  Location: [facility];  Service: Vascular;  Laterality: Left;  . FEMORAL-POPLITEAL BYPASS GRAFT Left 01/01/2016   Procedure: LEFT  FEMORAL-POPLITEAL ARTERY  BYPASS GRAFT ;  Surgeon: Elam Dutch, MD;  Location: [facility];  Service: Vascular;  Laterality: Left;  . FEMORAL-POPLITEAL BYPASS GRAFT Left 01/06/2016   Procedure: THROMBECTOMY WITH REVISION oF dISTAL Anastomosis BYPASS GRAFT FEMORAL-POPLITEAL  ARTERY.;  Surgeon: Rosetta Posner, MD;  Location: [facility];  Service: Vascular;  Laterality: Left;  . FEMORAL-POPLITEAL BYPASS GRAFT Left 01/06/2016   Procedure: REDO FEMORAL to BELOW THE KNEE POPLITEAL ARTERY BYPASS USING 57mm PROPATEN GRAFT;  Surgeon: Elam Dutch, MD;  Location: [place];  Service: Vascular;  Laterality: Left;  . FINGER AMPUTATION  1967   "partial; right pointer"  . INTRAOPERATIVE ARTERIOGRAM Left 01/01/2016   Procedure: INTRA OPERATIVE ARTERIOGRAM;  Surgeon: Elam Dutch, MD;  Location: [place];  Service: Vascular;  Laterality: Left;  . LEFT HEART CATHETERIZATION WITH CORONARY ANGIOGRAM N/A 04/13/2011   Procedure: LEFT HEART CATHETERIZATION WITH CORONARY ANGIOGRAM;  Surgeon: Josue Hector, MD;  Location: [facility] CATH LAB;  Service: Cardiovascular;  Laterality: N/A;  . LEFT HEART CATHETERIZATION WITH CORONARY ANGIOGRAM N/A 06/20/2013   Procedure: LEFT HEART CATHETERIZATION WITH CORONARY ANGIOGRAM;  Surgeon: Leonie Man, MD;  Location: [facility] CATH LAB;  Service: Cardiovascular;  Laterality: N/A;  . PERCUTANEOUS CORONARY STENT INTERVENTION (PCI-S) Right 04/13/2011   Procedure: PERCUTANEOUS CORONARY STENT INTERVENTION (PCI-S);  Surgeon: Burnell Blanks, MD;  Location: [facility] CATH LAB;  Service: Cardiovascular;  Laterality: Right;  . PERIPHERAL VASCULAR CATHETERIZATION N/A 10/15/2015   Procedure: Abdominal Aortogram w/Lower Extremity;  Surgeon: Serafina Mitchell, MD;  Location: [place] CV LAB;  Service: Cardiovascular;  Laterality: N/A;  . TONSILLECTOMY     "when I was a young one"  . Tricep Surgery  ~ 2007   "right arm; tore all my muscles @ my elbow"  . VEIN HARVEST Left 01/01/2016   Procedure: USING NON-REVERSE LEFT GREATER SAPHENOUS VEIN HARVEST;  Surgeon: Elam Dutch, MD;  Location: [place];  Service: Vascular;  Laterality: Left;       Home Medications    Prior to Admission medications   Medication Sig Start Date End Date Taking? Authorizing Provider  atorvastatin  (LIPITOR) 40 MG tablet Take 1 tablet (40 mg total) by mouth every evening. 01/23/16  Yes Lavon Paganini Angiulli, PA-C  dabigatran (PRADAXA) 150 MG CAPS capsule Take 1 capsule (150 mg total) by mouth 2 (two) times daily. 01/23/16  Yes Daniel J Angiulli, PA-C  iron polysaccharides (NIFEREX) 150 MG capsule Take 1 capsule (150 mg total) by mouth 2 times daily at 12 noon and 4 pm. 01/23/16  Yes Lavon Paganini Angiulli, PA-C  methocarbamol (ROBAXIN) 500 MG tablet Take 1 tablet (500 mg total) by mouth every 6 (six) hours as needed for muscle spasms. Patient taking differently: Take 500 mg by mouth 2 (two) times daily as needed for muscle spasms.  01/23/16  Yes Daniel J Angiulli, PA-C  metoprolol succinate (TOPROL-XL) 100 MG 24 hr tablet Take 1 tablet (100 mg total) by mouth daily. Take with or immediately following a meal. 01/23/16  Yes Daniel J Angiulli, PA-C  niacin (NIASPAN) 750 MG CR tablet Take 1 tablet (750 mg total) by  mouth at bedtime. 01/23/16  Yes Daniel J Angiulli, PA-C  nitroGLYCERIN (NITROSTAT) 0.4 MG SL tablet Place 1 tablet (0.4 mg total) under the tongue every 5 (five) minutes as needed for chest pain. 01/23/16  Yes Daniel J Angiulli, PA-C  pantoprazole (PROTONIX) 40 MG tablet Take 1 tablet (40 mg total) by mouth daily. 01/23/16  Yes Daniel J Angiulli, PA-C  traMADol (ULTRAM) 50 MG tablet Take 1 tablet (50 mg total) by mouth every 6 (six) hours as needed for moderate pain. 01/23/16  Yes Daniel J Angiulli, PA-C  sulfamethoxazole-trimethoprim (BACTRIM DS,SEPTRA DS) 800-160 MG tablet Take 1 tablet by mouth 2 (two) times daily. 01/31/16 02/07/16  Fatima Blank, MD    Family History Family History  Problem Relation Age of Onset  . Cancer Father   . Heart attack Paternal Grandfather     Social History Social History  Substance Use Topics  . Smoking status: Former Smoker    Packs/day: 1.00    Years: 12.00    Types: Cigarettes    Quit date: 12/24/1967  . Smokeless tobacco: Never Used     Comment: "quit  smoking cigarettes 1969"  . Alcohol use 0.0 oz/week     Comment: 10/15/2015 last beer was summer 2012; prior to that ~2008""     Allergies   No known allergies   Review of Systems Review of Systems  Constitutional: Positive for chills. Negative for fatigue and fever.  HENT: Negative for congestion and sore throat.   Eyes: Negative for visual disturbance.  Respiratory: Negative for cough, chest tightness and shortness of breath.   Cardiovascular: Negative for chest pain and palpitations.  Gastrointestinal: Negative for abdominal pain, blood in stool, diarrhea, nausea and vomiting.  Genitourinary: Negative for decreased urine volume and difficulty urinating.  Musculoskeletal: Positive for gait problem. Negative for back pain and neck stiffness.  Skin: Positive for wound. Negative for rash.  Neurological: Negative for light-headedness and headaches.  Psychiatric/Behavioral: Negative for confusion.  All other systems reviewed and are negative.    Physical Exam Updated Vital Signs BP 104/56 (BP Location: Right Arm)   Pulse 72   Temp 98.3 F (36.8 C) (Oral)   Resp 14   SpO2 100%   Physical Exam  Constitutional: He is oriented to person, place, and time. He appears well-developed and well-nourished. No distress.  HENT:  Head: Normocephalic and atraumatic.  Nose: Nose normal.  Eyes: Conjunctivae and EOM are normal. Pupils are equal, round, and reactive to light. Right eye exhibits no discharge. Left eye exhibits no discharge. No scleral icterus.  Neck: Normal range of motion. Neck supple.  Cardiovascular: Normal rate and regular rhythm.  Exam reveals no gallop and no friction rub.   No murmur heard. Pulmonary/Chest: Effort normal and breath sounds normal. No stridor. No respiratory distress. He has no rales.  Abdominal: Soft. He exhibits no distension. There is no tenderness.  Musculoskeletal: He exhibits no edema or tenderness.       Legs: Neurological: He is alert and  oriented to person, place, and time.  Skin: Skin is warm and dry. No rash noted. He is not diaphoretic. No erythema.  Psychiatric: He has a normal mood and affect.  Vitals reviewed.    ED Treatments / Results  Labs (all labs ordered are listed, but only abnormal results are displayed) Labs Reviewed  COMPREHENSIVE METABOLIC PANEL - Abnormal; Notable for the following:       Result Value   Sodium 128 (*)    Potassium 3.1 (*)  Chloride 93 (*)    Glucose, Bld 128 (*)    Calcium 8.3 (*)    Total Protein 5.7 (*)    Albumin 2.8 (*)    All other components within normal limits  CBC WITH DIFFERENTIAL/PLATELET - Abnormal; Notable for the following:    RBC 3.33 (*)    Hemoglobin 9.2 (*)    HCT 29.3 (*)    Platelets 141 (*)    All other components within normal limits  CULTURE, BLOOD (ROUTINE X 2)  CULTURE, BLOOD (ROUTINE X 2)  URINE CULTURE  URINALYSIS, ROUTINE W REFLEX MICROSCOPIC (NOT AT [facility])  I-STAT CG4 LACTIC ACID, ED  I-STAT CG4 LACTIC ACID, ED    EKG  EKG Interpretation None       Radiology No results found.  Procedures Procedures (including critical care time)  Emergency Focused Ultrasound Exam Limited Ultrasound of Soft Tissue   Performed and interpreted by Dr. Leonette Monarch Indication: evaluation for infection or foreign body Transverse and Sagittal views of Left leg stump and left groin are obtained in real time for the purposes of evaluation of skin and underlying soft tissues.  Findings: notable fluid collection, no hyperemia/edema of surrounding tissue Interpretation: Most consistent with seroma vs hematoma. Low suspicion for abscess given lack of pain with compression. Images archived electronically.  CPT Codes:  Pelvic wall [id number]  Lower extremity [phone]    Medications Ordered in ED Medications  sulfamethoxazole-trimethoprim (BACTRIM DS,SEPTRA DS) 800-160 MG per tablet 1 tablet (not administered)  sodium chloride 0.9 % bolus 1,000 mL (0 mLs  Intravenous Stopped 01/31/16 1839)     Initial Impression / Assessment and Plan / ED Course  I have reviewed the triage vital signs and the nursing notes.  Pertinent labs & imaging results that were available during my care of the patient were reviewed by me and considered in my medical decision making (see chart for details).  Clinical Course    Postop wound check. Concerning for infection by home nurse. Presentation revealed hyperemia of the incision. Wound looks like it's healing well, dry, intact with staples in place. Growing incision with mild dehiscence. No evidence to suggest infection at this time. Bedside ultrasound with loculated fluid most consistent with seroma vs hematoma given the lack of pain and serous sanguinous discharge. Labs without leukocytosis. Mild electrolyte derangements. Patient did have low blood pressures that responded well to IV fluids. Pressure maintained after several hours of observation.  Vascular surgery consulted who evaluated the patient in the emergency department and agreed with active evidence that suggested infection. They did however request Bactrim be prescribed. Patient already has follow-up with vascular surgery in 1 week with home nurse visits in 3-4 days.  Patient is safe for discharge with strict return precautions.  Final Clinical Impressions(s) / ED Diagnoses   Final diagnoses:  Wound disruption, post-op, skin, initial encounter   Disposition: Discharge  Condition: Good  I have discussed the results, Dx and Tx plan with the patient who expressed understanding and agree(s) with the plan. Discharge instructions discussed at great length. The patient was given strict return precautions who verbalized understanding of the instructions. No further questions at time of discharge.    New Prescriptions   SULFAMETHOXAZOLE-TRIMETHOPRIM (BACTRIM DS,SEPTRA DS) 800-160 MG TABLET    Take 1 tablet by mouth 2 (two) times daily.    Follow  Up: [facility] [place] [address] [phone]  Call  As needed  Elam Dutch, MD [place]  [place] [address] [phone]  On 02/06/2016 as scheduled for next Thursday.      Fatima Blank, MD 02/01/16 [phone]

## 2016-01-31 NOTE — Consult Note (Signed)
Hospital Consult    Reason for Consult:  Left AKA wound Referring Physician:  ED MRN #:  [id number]  History of Present Illness: This is a 75 y.o. male with recent history of left lower showed a bypass ultimately requiring left above-knee amputation. He had a difficult hospital course complicated by pneumothorax requiring tube thoracostomy. He ultimately was discharged to inpatient rehabilitation and has been home now since last Saturday. His home health nurse today noticed drainage as well as erythema of his right above-knee amputation site that still had staples intact. He also endorsed a low-grade fever to 100.5 and was complaining of some chills and weakness. He was only sent to our office where he was found to be hypotensive to [id number] systolic and now he is in the emergency department where he is received fluid which his blood pressure has responded. He does not complain of any pain at the amputation site and has not had other fevers. He states that he continues to eat well but has not been taking adequate fluid.  Past Medical History:  Diagnosis Date  . Angina    last time >yr  . Arthritis    "knees, ankles" (10/15/2015)  . Basal cell carcinoma of scalp    "froze off"  . CAD (coronary artery disease)    Previous stents last 6 years ago [place] [facility]  . Complication of anesthesia   . Cough    recent bronchitis- dry cough  . Dry cough   . DVT (deep venous thrombosis) (HCC) ~ 1969   LLE  . Elevated lipids   . GERD (gastroesophageal reflux disease)    occ -tums  . High cholesterol   . History of hiatal hernia    hx  . Hypertension   . Myocardial infarction [facility]) ~ 2006   "blood work"  . Peripheral vascular disease ([place])   . PONV (postoperative nausea and vomiting)   . Shortness of breath 04/13/11   "mostly w/exertion"/ now with bronchitis    Past Surgical History:  Procedure Laterality Date  . AMPUTATION Left 01/07/2016   Procedure: AMPUTATION LEFT LEG  ABOVE KNEE;  Surgeon:  Elam Dutch, MD;  Location: [place];  Service: Vascular;  Laterality: Left;  . CARPAL TUNNEL RELEASE Right 1990s  . CORONARY ANGIOPLASTY WITH STENT PLACEMENT  ~ 2004; ~ 2006; 04/13/11   "# of stents 1 (i2004);+2 (2006)+ 1 (04/13/11)= 4 total"  . ENDARTERECTOMY FEMORAL Left 01/01/2016   Procedure: LEFT FEMORAL ARTERY  ENDARTERECTOMY ;  Surgeon: Elam Dutch, MD;  Location: [place];  Service: Vascular;  Laterality: Left;  . FEMORAL ARTERY STENT Left 10/15/2015  . FEMORAL-POPLITEAL BYPASS GRAFT Left 01/01/2016   Procedure: Thrombectomy of Left Leg Femoral-Popliteal Bypass Graft;  Surgeon: Elam Dutch, MD;  Location: [place];  Service: Vascular;  Laterality: Left;  . FEMORAL-POPLITEAL BYPASS GRAFT Left 01/01/2016   Procedure: LEFT  FEMORAL-POPLITEAL ARTERY  BYPASS GRAFT ;  Surgeon: Elam Dutch, MD;  Location: [place];  Service: Vascular;  Laterality: Left;  . FEMORAL-POPLITEAL BYPASS GRAFT Left 01/06/2016   Procedure: THROMBECTOMY WITH REVISION oF dISTAL Anastomosis BYPASS GRAFT FEMORAL-POPLITEAL ARTERY.;  Surgeon: Rosetta Posner, MD;  Location: [place];  Service: Vascular;  Laterality: Left;  . FEMORAL-POPLITEAL BYPASS GRAFT Left 01/06/2016   Procedure: REDO FEMORAL to BELOW THE KNEE POPLITEAL ARTERY BYPASS USING 1mm PROPATEN GRAFT;  Surgeon: Elam Dutch, MD;  Location: [place];  Service: Vascular;  Laterality: Left;  . [place]   "  partial; right pointer"  . INTRAOPERATIVE ARTERIOGRAM Left 01/01/2016   Procedure: INTRA OPERATIVE ARTERIOGRAM;  Surgeon: Elam Dutch, MD;  Location: [place];  Service: Vascular;  Laterality: Left;  . LEFT HEART CATHETERIZATION WITH CORONARY ANGIOGRAM N/A 04/13/2011   Procedure: LEFT HEART CATHETERIZATION WITH CORONARY ANGIOGRAM;  Surgeon: Josue Hector, MD;  Location: [facility] CATH LAB;  Service: Cardiovascular;  Laterality: N/A;  . LEFT HEART CATHETERIZATION WITH CORONARY ANGIOGRAM N/A 06/20/2013   Procedure: LEFT HEART CATHETERIZATION WITH CORONARY  ANGIOGRAM;  Surgeon: Leonie Man, MD;  Location: [facility] CATH LAB;  Service: Cardiovascular;  Laterality: N/A;  . PERCUTANEOUS CORONARY STENT INTERVENTION (PCI-S) Right 04/13/2011   Procedure: PERCUTANEOUS CORONARY STENT INTERVENTION (PCI-S);  Surgeon: Burnell Blanks, MD;  Location: [facility] CATH LAB;  Service: Cardiovascular;  Laterality: Right;  . PERIPHERAL VASCULAR CATHETERIZATION N/A 10/15/2015   Procedure: Abdominal Aortogram w/Lower Extremity;  Surgeon: Serafina Mitchell, MD;  Location: [place] CV LAB;  Service: Cardiovascular;  Laterality: N/A;  . TONSILLECTOMY     "when I was a young one"  . Tricep Surgery  ~ 2007   "right arm; tore all my muscles @ my elbow"  . VEIN HARVEST Left 01/01/2016   Procedure: USING NON-REVERSE LEFT GREATER SAPHENOUS VEIN HARVEST;  Surgeon: Elam Dutch, MD;  Location: [place];  Service: Vascular;  Laterality: Left;    Allergies  Allergen Reactions  . No Known Allergies     Prior to Admission medications   Medication Sig Start Date End Date Taking? Authorizing Provider  atorvastatin (LIPITOR) 40 MG tablet Take 1 tablet (40 mg total) by mouth every evening. 01/23/16  Yes Lavon Paganini Angiulli, PA-C  dabigatran (PRADAXA) 150 MG CAPS capsule Take 1 capsule (150 mg total) by mouth 2 (two) times daily. 01/23/16  Yes Daniel J Angiulli, PA-C  iron polysaccharides (NIFEREX) 150 MG capsule Take 1 capsule (150 mg total) by mouth 2 times daily at 12 noon and 4 pm. 01/23/16  Yes Lavon Paganini Angiulli, PA-C  methocarbamol (ROBAXIN) 500 MG tablet Take 1 tablet (500 mg total) by mouth every 6 (six) hours as needed for muscle spasms. Patient taking differently: Take 500 mg by mouth 2 (two) times daily as needed for muscle spasms.  01/23/16  Yes Daniel J Angiulli, PA-C  metoprolol succinate (TOPROL-XL) 100 MG 24 hr tablet Take 1 tablet (100 mg total) by mouth daily. Take with or immediately following a meal. 01/23/16  Yes Daniel J Angiulli, PA-C  niacin (NIASPAN) 750 MG CR tablet  Take 1 tablet (750 mg total) by mouth at bedtime. 01/23/16  Yes Daniel J Angiulli, PA-C  nitroGLYCERIN (NITROSTAT) 0.4 MG SL tablet Place 1 tablet (0.4 mg total) under the tongue every 5 (five) minutes as needed for chest pain. 01/23/16  Yes Daniel J Angiulli, PA-C  pantoprazole (PROTONIX) 40 MG tablet Take 1 tablet (40 mg total) by mouth daily. 01/23/16  Yes Daniel J Angiulli, PA-C  traMADol (ULTRAM) 50 MG tablet Take 1 tablet (50 mg total) by mouth every 6 (six) hours as needed for moderate pain. 01/23/16  Yes Daniel J Angiulli, PA-C  sulfamethoxazole-trimethoprim (BACTRIM DS,SEPTRA DS) 800-160 MG tablet Take 1 tablet by mouth 2 (two) times daily. 01/31/16 02/07/16  Fatima Blank, MD    Social History   Social History  . Marital status: Divorced    Spouse name: N/A  . Number of children: N/A  . Years of education: N/A   Occupational History  . Retired    Science writer  History Main Topics  . Smoking status: Former Smoker    Packs/day: 1.00    Years: 12.00    Types: Cigarettes    Quit date: 12/24/1967  . Smokeless tobacco: Never Used     Comment: "quit smoking cigarettes 1969"  . Alcohol use 0.0 oz/week     Comment: 10/15/2015 last beer was summer 2012; prior to that ~2008""  . Drug use: No  . Sexual activity: No   Other Topics Concern  . 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 Cancer Father   . Heart attack Paternal Grandfather     ROS: [x]  Positive   [ ]  Negative   [ ]  All sytems reviewed and are negative  Cardiovascular: []  chest pain/pressure []  palpitations []  SOB lying flat []  DOE []  pain in legs while walking []  pain in legs at rest []  pain in legs at night []  non-healing ulcers []  hx of DVT []  swelling in legs  Pulmonary: []  productive cough []  asthma/wheezing []  home O2  Neurologic: []  weakness in []  arms  []  legs []  numbness in []  arms []  legs []  hx of CVA []  mini stroke [] difficulty speaking or slurred speech []  temporary loss of vision in one eye []  dizziness  Hematologic: []  hx of cancer []  bleeding problems []  problems with blood clotting easily  Endocrine:   []  diabetes []  thyroid disease  GI []  vomiting blood []  blood in stool  GU: []  CKD/renal failure []  HD--[]  M/W/F or []  T/T/S []  burning with urination []  blood in urine  Psychiatric: []  anxiety []  depression  Musculoskeletal: []  arthritis []  joint pain  Integumentary: []  rashes []  ulcers  Constitutional: [x]  fever [x]  chills   Physical Examination  Vitals:   01/31/16 1900 01/31/16 2033  BP: 111/60 114/64  Pulse: 77 74  Resp: 18 22  Temp:     Body mass index is 27.06 kg/m.  General:  WDWN in NAD HENT: WNL, normocephalic Pulmonary: normal non-labored breathing Cardiac: sinus rhythma Abdomen: soft Musculoskeletal: Left AKA site medial edge with evidence of serous drainage but no expressable fluid, non-tender, minimal erythema with staples  CBC    Component Value Date/Time   WBC 6.3 01/31/2016 1710   RBC 3.33 (L) 01/31/2016 1710   HGB 9.2 (L) 01/31/2016 1710   HCT 29.3 (L) 01/31/2016 1710   PLT 141 (L) 01/31/2016 1710   MCV 88.0 01/31/2016 1710   MCH 27.6 01/31/2016 1710   MCHC 31.4 01/31/2016 1710   RDW 15.0 01/31/2016 1710   LYMPHSABS 0.9 01/31/2016 1710   MONOABS 0.5 01/31/2016 1710   EOSABS 0.4 01/31/2016 1710   BASOSABS 0.0 01/31/2016 1710    BMET    Component Value Date/Time   NA 128 (L) 01/31/2016 1710   K 3.1 (L) 01/31/2016 1710   CL 93 (L) 01/31/2016 1710   CO2 25 01/31/2016 1710   GLUCOSE 128 (H) 01/31/2016 1710   BUN 11 01/31/2016 1710   CREATININE 1.04 01/31/2016 1710   CALCIUM 8.3 (L) 01/31/2016 1710   GFRNONAA >60 01/31/2016 1710   GFRAA >60 01/31/2016 1710    COAGS: Lab Results  Component Value Date   INR 1.00 01/01/2016   INR 1.72 (H) 12/24/2015   INR  1.06 10/01/2015  ASSESSMENT/PLAN: This is a 75 y.o. male status post left above-knee amputation now presenting with hypotension and concern for fever and infection. The wound has drained some thin fluid but I cannot express anything else. There is no erythema associated with staples and the patient has responded to fluid with systolic pressures now in the 120s. He is mentating well and really looks better than he did as an inpatient. He had his wife state that he is safe to go home and he will ensure that he will return should he have further issues. We will give him a dose of Bactrim here for the erythema with a prescription for 1 week of Bactrim and he will see Dr. Oneida Alar next Thursday should he not have issues between now and then.  Amyrie Illingworth C. Donzetta Matters, MD [facility] [place] Office: [phone] Pager: [phone]

## 2016-02-02 LAB — URINE CULTURE

## 2016-02-03 LAB — WOUND CULTURE: GRAM STAIN: NONE SEEN

## 2016-02-04 ENCOUNTER — Other Ambulatory Visit: Payer: Self-pay | Admitting: Family

## 2016-02-04 DIAGNOSIS — T814XXA Infection following a procedure, initial encounter: Principal | ICD-10-CM

## 2016-02-04 DIAGNOSIS — IMO0001 Reserved for inherently not codable concepts without codable children: Secondary | ICD-10-CM

## 2016-02-04 MED ORDER — CIPROFLOXACIN HCL 250 MG PO TABS: 500.0000 mg | ORAL_TABLET | Freq: Two times a day (BID) | ORAL | Status: DC

## 2016-02-04 NOTE — Progress Notes (Unsigned)
levaquin  

## 2016-02-05 LAB — CULTURE, BLOOD (ROUTINE X 2)
CULTURE: NO GROWTH
CULTURE: NO GROWTH

## 2016-02-06 ENCOUNTER — Ambulatory Visit (INDEPENDENT_AMBULATORY_CARE_PROVIDER_SITE_OTHER): Payer: Medicare Other | Admitting: Vascular Surgery

## 2016-02-06 ENCOUNTER — Encounter: Payer: Self-pay | Admitting: Vascular Surgery

## 2016-02-06 VITALS — BP 116/66 | HR 68 | Temp 97.6°F | Resp 16 | Ht 68.0 in | Wt 167.0 lb

## 2016-02-06 DIAGNOSIS — I739 Peripheral vascular disease, unspecified: Secondary | ICD-10-CM

## 2016-02-06 NOTE — Progress Notes (Signed)
Patient is 75 year old male who returns for postop follow-up today. He underwent left femoral to above-knee popliteal bypass 8/2 and this reoccluded several times. He ultimately ended up with a left above-knee amputation 8/8. His hospital stay was also complicated by right-sided pneumothorax and required a chest tube briefly. He returns today for follow-up after a brief stay in rehabilitation. His appetite is returning. He still has some days where he is tired. His blood pressure has been slightly low 90s to 100s but he has not been symptomatic from this. He was seen in the ER recently and a culture taken of the medial aspect of his right AKA which grew out staph which was sensitive to multiple antibiotics. He is currently on Cipro. He denies any fever or chills. He denies any groin drainage.  Physical exam:  Vitals:   02/06/16 1250  BP: 116/66  Pulse: 68  Resp: 16  Temp: 97.6 F (36.4 C)  TempSrc: Oral  SpO2: 100%  Weight: 167 lb (75.8 kg)  Height: 5\' 8"  (1.727 m)    Left lower extremity: Some maceration at the superior most aspect the left groin. No drainage. Slight separation of left mid thigh incision overall clean. Some dry eschar medial aspect right above-knee amputation with a small amount of clear drainage. No frank abscess.  Assessment: Overall doing well medial corner of right AKA healing but may end up needing some local wound care. Hypotension, asymptomatic will monitor for now.  Plan: Patient will keep a blood pressure diary and bring this back in 2 weeks. We may need to consider adjusting his blood pressure medications. Staples were removed from the above-knee amputation today. He will follow-up in 2 weeks for reassessment of the incision of his left leg. He will continue his Cipro to completion.  Ruta Hinds, MD [facility] [place] Office: [phone] Pager: [phone]

## 2016-02-10 ENCOUNTER — Encounter (HOSPITAL_COMMUNITY): Payer: Self-pay | Admitting: *Deleted

## 2016-02-10 ENCOUNTER — Emergency Department (HOSPITAL_COMMUNITY): Payer: Non-veteran care

## 2016-02-10 ENCOUNTER — Telehealth: Payer: Self-pay

## 2016-02-10 ENCOUNTER — Emergency Department (HOSPITAL_COMMUNITY)
Admission: EM | Admit: 2016-02-10 | Discharge: 2016-02-11 | Disposition: A | Discharge status: Home / Self Care | Payer: Non-veteran care | Attending: Emergency Medicine | Admitting: Emergency Medicine

## 2016-02-10 DIAGNOSIS — R091 Pleurisy: Secondary | ICD-10-CM | POA: Diagnosis not present

## 2016-02-10 DIAGNOSIS — I2692 Saddle embolus of pulmonary artery without acute cor pulmonale: Secondary | ICD-10-CM

## 2016-02-10 DIAGNOSIS — Z87891 Personal history of nicotine dependence: Secondary | ICD-10-CM | POA: Insufficient documentation

## 2016-02-10 DIAGNOSIS — Z7901 Long term (current) use of anticoagulants: Secondary | ICD-10-CM | POA: Insufficient documentation

## 2016-02-10 DIAGNOSIS — I252 Old myocardial infarction: Secondary | ICD-10-CM | POA: Insufficient documentation

## 2016-02-10 DIAGNOSIS — I25119 Atherosclerotic heart disease of native coronary artery with unspecified angina pectoris: Secondary | ICD-10-CM | POA: Insufficient documentation

## 2016-02-10 DIAGNOSIS — R079 Chest pain, unspecified: Secondary | ICD-10-CM | POA: Diagnosis present

## 2016-02-10 DIAGNOSIS — Z951 Presence of aortocoronary bypass graft: Secondary | ICD-10-CM | POA: Diagnosis not present

## 2016-02-10 DIAGNOSIS — R0781 Pleurodynia: Secondary | ICD-10-CM | POA: Insufficient documentation

## 2016-02-10 DIAGNOSIS — Z79899 Other long term (current) drug therapy: Secondary | ICD-10-CM | POA: Insufficient documentation

## 2016-02-10 LAB — BASIC METABOLIC PANEL
ANION GAP: 12 (ref 5–15)
BUN: 17 mg/dL (ref 6–20)
CALCIUM: 9.5 mg/dL (ref 8.9–10.3)
CO2: 25 mmol/L (ref 22–32)
CREATININE: 1.39 mg/dL — AB (ref 0.61–1.24)
Chloride: 96 mmol/L — ABNORMAL LOW (ref 101–111)
GFR, EST AFRICAN AMERICAN: 56 mL/min — AB (ref >60)
GFR, EST NON AFRICAN AMERICAN: 48 mL/min — AB (ref >60)
GLUCOSE: 148 mg/dL — AB (ref 65–99)
Potassium: 4.3 mmol/L (ref 3.5–5.1)
Sodium: 133 mmol/L — ABNORMAL LOW (ref 135–145)

## 2016-02-10 LAB — CBC
HCT: 31.2 % — ABNORMAL LOW (ref 39.0–52.0)
Hemoglobin: 9.7 g/dL — ABNORMAL LOW (ref 13.0–17.0)
MCH: 27.1 pg (ref 26.0–34.0)
MCHC: 31.1 g/dL (ref 30.0–36.0)
MCV: 87.2 fL (ref 78.0–100.0)
PLATELETS: 272 10*3/uL (ref 150–400)
RBC: 3.58 MIL/uL — AB (ref 4.22–5.81)
RDW: 14.3 % (ref 11.5–15.5)
WBC: 9.8 10*3/uL (ref 4.0–10.5)

## 2016-02-10 LAB — I-STAT TROPONIN, ED
TROPONIN I, POC: 0 ng/mL (ref 0.00–0.08)
Troponin i, poc: 0 ng/mL (ref 0.00–0.08)

## 2016-02-10 MED ORDER — IOPAMIDOL (ISOVUE-370) INJECTION 76%
INTRAVENOUS | Status: AC
  Administered 2016-02-10: 100 mL
  Filled 2016-02-10: qty 100

## 2016-02-10 NOTE — ED Provider Notes (Signed)
Raymond Jacobson Provider Note   CSN: [id number] Arrival date & time: 02/10/16  1338     History   Chief Complaint Chief Complaint  Patient presents with  . Chest Pain    side pain    HPI Raymond Jacobson is a 75 y.o. male.  HPI   Patient with hx DVT, PE, CAD, HTN, recent AKA (01/07/16)  following fem-pop bypass with reocclusion and hospital complication of right sided pneumothorax presents with left lateral chest pain that is sharp and pleuritic, radiates into his left axilla.  The pain began yesterday morning.  Pain hurts somewhat with movement and lying on the left side, but with increased intensity with deep inspiration.  Has also had chills.  Denies fevers, hemoptysis, abdominal pain.   Left AKA wound has been healing fairly well, followed closely by Dr Oneida Alar.  Denies any significant incisional pain, discharge, bleeding, swelling. Has been taking ciprofloxacin 1000mg  BID.   Pt is on Pradaxa.    Past Medical History:  Diagnosis Date  . Angina    last time >yr  . Arthritis    "knees, ankles" (10/15/2015)  . Basal cell carcinoma of scalp    "froze off"  . CAD (coronary artery disease)    Previous stents last 6 years ago [place] CT  . Complication of anesthesia   . Cough    recent bronchitis- dry cough  . Dry cough   . DVT (deep venous thrombosis) (HCC) ~ 1969   LLE  . Elevated lipids   . GERD (gastroesophageal reflux disease)    occ -tums  . High cholesterol   . History of hiatal hernia    hx  . Hypertension   . Myocardial infarction [facility]) ~ 2006   "blood work"  . Peripheral vascular disease ([place])   . PONV (postoperative nausea and vomiting)   . Shortness of breath 04/13/11   "mostly w/exertion"/ now with bronchitis    Patient Active Problem List   Diagnosis Date Noted  . Prediabetes   . Benign essential HTN   . Status post above knee amputation of left lower extremity ([place]) 01/17/2016  . Abdominal distension   . 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 Coronary artery  disease involving native coronary artery of native heart without angina pectoris   . 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 HLD (hyperlipidemia)   . Post-operative pain   . Acute blood loss anemia   . Right lower lobe pneumonia   . Diastolic dysfunction   . Tachypnea   . Tachycardia   . Hypokalemia   . Leukocytosis   . Thrombocytopenia ([place])   . Surgery, other elective   . Pain of upper abdomen   . Pneumothorax on right   . Chest trauma   . Acute respiratory failure ([place])   . Pre-operative cardiovascular examination 12/31/2015  . PAD (peripheral artery disease) ([place]) 10/15/2015  . ACS (acute coronary syndrome) ([place]) 06/20/2013  . HTN (hypertension) 04/13/2011  . Elevated lipids 04/13/2011  . CAD S/P multiple PCIs 04/13/2011    Past Surgical History:  Procedure Laterality Date  . AMPUTATION Left 01/07/2016   Procedure: AMPUTATION LEFT LEG  ABOVE KNEE;  Surgeon: Elam Dutch, MD;  Location: [place];  Service: Vascular;  Laterality: Left;  . CARPAL TUNNEL RELEASE Right 1990s  . CORONARY ANGIOPLASTY WITH STENT PLACEMENT  ~ 2004; ~ 2006; 04/13/11   "# of stents 1 (i2004);+2 (2006)+ 1 (04/13/11)= 4 total"  . ENDARTERECTOMY FEMORAL Left  01/01/2016   Procedure: LEFT FEMORAL ARTERY  ENDARTERECTOMY ;  Surgeon: Elam Dutch, MD;  Location: [place];  Service: Vascular;  Laterality: Left;  . FEMORAL ARTERY STENT Left 10/15/2015  . FEMORAL-POPLITEAL BYPASS GRAFT Left 01/01/2016   Procedure: Thrombectomy of Left Leg Femoral-Popliteal Bypass Graft;  Surgeon: Elam Dutch, MD;  Location: [place];  Service: Vascular;  Laterality: Left;  . FEMORAL-POPLITEAL BYPASS GRAFT Left 01/01/2016   Procedure: LEFT  FEMORAL-POPLITEAL ARTERY  BYPASS GRAFT ;  Surgeon: Elam Dutch, MD;  Location: [place];  Service: Vascular;  Laterality: Left;  . FEMORAL-POPLITEAL BYPASS GRAFT Left 01/06/2016   Procedure: THROMBECTOMY WITH REVISION oF dISTAL Anastomosis BYPASS GRAFT FEMORAL-POPLITEAL  ARTERY.;  Surgeon: Rosetta Posner, MD;  Location: [place];  Service: Vascular;  Laterality: Left;  . FEMORAL-POPLITEAL BYPASS GRAFT Left 01/06/2016   Procedure: REDO FEMORAL to BELOW THE KNEE POPLITEAL ARTERY BYPASS USING 63mm PROPATEN GRAFT;  Surgeon: Elam Dutch, MD;  Location: [place];  Service: Vascular;  Laterality: Left;  . FINGER AMPUTATION  1967   "partial; right pointer"  . INTRAOPERATIVE ARTERIOGRAM Left 01/01/2016   Procedure: INTRA OPERATIVE ARTERIOGRAM;  Surgeon: Elam Dutch, MD;  Location: [place];  Service: Vascular;  Laterality: Left;  . LEFT HEART CATHETERIZATION WITH CORONARY ANGIOGRAM N/A 04/13/2011   Procedure: LEFT HEART CATHETERIZATION WITH CORONARY ANGIOGRAM;  Surgeon: Josue Hector, MD;  Location: [facility] CATH LAB;  Service: Cardiovascular;  Laterality: N/A;  . LEFT HEART CATHETERIZATION WITH CORONARY ANGIOGRAM N/A 06/20/2013   Procedure: LEFT HEART CATHETERIZATION WITH CORONARY ANGIOGRAM;  Surgeon: Leonie Man, MD;  Location: [facility] CATH LAB;  Service: Cardiovascular;  Laterality: N/A;  . PERCUTANEOUS CORONARY STENT INTERVENTION (PCI-S) Right 04/13/2011   Procedure: PERCUTANEOUS CORONARY STENT INTERVENTION (PCI-S);  Surgeon: Burnell Blanks, MD;  Location: [facility] CATH LAB;  Service: Cardiovascular;  Laterality: Right;  . PERIPHERAL VASCULAR CATHETERIZATION N/A 10/15/2015   Procedure: Abdominal Aortogram w/Lower Extremity;  Surgeon: Serafina Mitchell, MD;  Location: [place] CV LAB;  Service: Cardiovascular;  Laterality: N/A;  . TONSILLECTOMY     "when I was a young one"  . Tricep Surgery  ~ 2007   "right arm; tore all my muscles @ my elbow"  . VEIN HARVEST Left 01/01/2016   Procedure: USING NON-REVERSE LEFT GREATER SAPHENOUS VEIN HARVEST;  Surgeon: Elam Dutch, MD;  Location: [place];  Service: Vascular;  Laterality: Left;       Home Medications    Prior to Admission medications   Medication Sig Start Date End Date Taking? Authorizing Provider  atorvastatin  (LIPITOR) 40 MG tablet Take 1 tablet (40 mg total) by mouth every evening. 01/23/16  Yes Daniel J Angiulli, PA-C  ciprofloxacin (CIPRO) 500 MG tablet Take 1,000 mg by mouth 2 (two) times daily. 02/05/16  Yes Historical Provider, MD  dabigatran (PRADAXA) 150 MG CAPS capsule Take 1 capsule (150 mg total) by mouth 2 (two) times daily. 01/23/16  Yes Daniel J Angiulli, PA-C  iron polysaccharides (NIFEREX) 150 MG capsule Take 1 capsule (150 mg total) by mouth 2 times daily at 12 noon and 4 pm. 01/23/16  Yes Lavon Paganini Angiulli, PA-C  methocarbamol (ROBAXIN) 500 MG tablet Take 1 tablet (500 mg total) by mouth every 6 (six) hours as needed for muscle spasms. Patient taking differently: Take 500 mg by mouth 2 (two) times daily as needed for muscle spasms.  01/23/16  Yes Daniel J Angiulli, PA-C  metoprolol succinate (TOPROL-XL) 100 MG 24 hr  tablet Take 1 tablet (100 mg total) by mouth daily. Take with or immediately following a meal. 01/23/16  Yes Daniel J Angiulli, PA-C  niacin (NIASPAN) 750 MG CR tablet Take 1 tablet (750 mg total) by mouth at bedtime. 01/23/16  Yes Daniel J Angiulli, PA-C  nitroGLYCERIN (NITROSTAT) 0.4 MG SL tablet Place 1 tablet (0.4 mg total) under the tongue every 5 (five) minutes as needed for chest pain. 01/23/16  Yes Daniel J Angiulli, PA-C  pantoprazole (PROTONIX) 40 MG tablet Take 1 tablet (40 mg total) by mouth daily. 01/23/16  Yes Daniel J Angiulli, PA-C  traMADol (ULTRAM) 50 MG tablet Take 1 tablet (50 mg total) by mouth every 6 (six) hours as needed for moderate pain. 01/23/16  Yes Daniel J Angiulli, PA-C  oxyCODONE (ROXICODONE) 5 MG immediate release tablet Take 0.5-1 tablets (2.5-5 mg total) by mouth every 4 (four) hours as needed for severe pain. 02/11/16   Clayton Bibles, PA-C    Family History Family History  Problem Relation Age of Onset  . Cancer Father   . Heart attack Paternal Grandfather     Social History Social History  Substance Use Topics  . Smoking status: Former Smoker     Packs/day: 1.00    Years: 12.00    Types: Cigarettes    Quit date: 12/24/1967  . Smokeless tobacco: Never Used     Comment: "quit smoking cigarettes 1969"  . Alcohol use 0.0 oz/week     Comment: 10/15/2015 last beer was summer 2012; prior to that ~2008""     Allergies   No known allergies   Review of Systems Review of Systems  All other systems reviewed and are negative.    Physical Exam Updated Vital Signs BP 133/62   Pulse 83   Temp 98.8 F (37.1 C) (Oral)   Resp 16   Ht 5\' 8"  (1.727 m)   Wt 75.8 kg   SpO2 97%   BMI 25.39 kg/m   Physical Exam  Constitutional: He appears well-developed and well-nourished. No distress.  HENT:  Head: Normocephalic and atraumatic.  Neck: Neck supple.  Cardiovascular: Normal rate and regular rhythm.   Pulmonary/Chest: Effort normal and breath sounds normal. No respiratory distress. He has no wheezes. He has no rales. He exhibits no tenderness.  Abdominal: Soft. He exhibits no distension and no mass. There is no tenderness. There is no rebound and no guarding.  Musculoskeletal:  Left AKA with healing surgical incision.  Small area of dehiscence and scabbing over posterior aspect.  No erythema, edema, warmth, discharge, or tenderness   Neurological: He is alert. He exhibits normal muscle tone.  Skin: He is not diaphoretic.  Nursing note and vitals reviewed.    ED Treatments / Results  Labs (all labs ordered are listed, but only abnormal results are displayed) Labs Reviewed  BASIC METABOLIC PANEL - Abnormal; Notable for the following:       Result Value   Sodium 133 (*)    Chloride 96 (*)    Glucose, Bld 148 (*)    Creatinine, Ser 1.39 (*)    GFR calc non Af Amer 48 (*)    GFR calc Af Amer 56 (*)    All other components within normal limits  CBC - Abnormal; Notable for the following:    RBC 3.58 (*)    Hemoglobin 9.7 (*)    HCT 31.2 (*)    All other components within normal limits  I-STAT TROPOININ, ED  Randolm Idol,  ED  EKG  EKG Interpretation  Date/Time:  Monday February 10 2016 13:48:29 EDT Ventricular Rate:  77 PR Interval:  168 QRS Duration: 92 QT Interval:  374 QTC Calculation: 423 R Axis:   4 Text Interpretation:  Normal sinus rhythm Minimal voltage criteria for LVH, may be normal variant Inferior infarct , age undetermined Abnormal ECG No significant change since last tracing Confirmed by ISAACS MD, CAMERON [phone]) on 02/10/2016 9:44:31 PM       Radiology Dg Chest 2 View  Result Date: 02/10/2016 CLINICAL DATA:  Left chest pain. Left chest pain extending into the left axilla. Shortness of breath with inspiration. Ex-smoker. EXAM: CHEST  2 VIEW COMPARISON:  01/19/2016 and chest CT dated 01/09/2016. FINDINGS: Poor inspiration. Mild left basilar atelectasis. Otherwise, clear lungs. Normal vascularity. Thoracic spine degenerative changes. Small amount of pleural fluid posteriorly. IMPRESSION: 1. Small posterior pleural effusion on one or both sides. 2. Mild left basilar atelectasis. Electronically Signed   By: Claudie Revering M.D.   On: 02/10/2016 15:02   Ct Angio Chest Pe W And/or Wo Contrast  Result Date: 02/10/2016 CLINICAL DATA:  Left-sided pleuritic chest pain with exertion. Recent diagnosis of pulmonary embolus 1 month prior. Recent AKA. EXAM: CT ANGIOGRAPHY CHEST WITH CONTRAST TECHNIQUE: Multidetector CT imaging of the chest was performed using the standard protocol during bolus administration of intravenous contrast. Multiplanar CT image reconstructions and MIPs were obtained to evaluate the vascular anatomy. CONTRAST:  80 cc Isovue 370 IV COMPARISON:  Radiograph earlier this day.  Chest CT 01/09/2016 FINDINGS: Cardiovascular: Improved thromboembolic burden compared to prior CT. Residual thrombus in the segmental branches of the left lower lobe most prominent in the posterior basal segment. There is a segmental filling defect involving the lateral left lower lobe pulmonary artery that was  likely present on prior, not as well visualized previously given non angiographic technique. No definite new or progressive pulmonary embolus. Current RV LV ratio of 1.04, previously 1.2. Normal caliber thoracic aorta. Left vertebral artery arises directly from the aorta, normal variant. Mild thoracic aortic atherosclerosis. Coronary artery calcifications versus stents. Mediastinum/Nodes: No pericardial fluid. No mediastinal or hilar adenopathy. Lungs/Pleura: Right anterior upper lobe scarring at site of prior chest tube. Improved right lung aeration compared to prior. Peripheral opacity with central bubbly lucency in the lateral segment of the right lower lobe is likely an evolving pulmonary infarct. No definite pulmonary infarct on the left. Small bilateral pleural effusions, and improved on the right and unchanged on the left from prior. Scattered left lung atelectasis or scarring. No new focal airspace disease. No recurrent pneumothorax. Upper Abdomen: No acute abnormality. Thickening of the right adrenal gland is unchanged. Resolved gaseous gastric distention. Musculoskeletal: There are no acute or suspicious osseous abnormalities. Review of the MIP images confirms the above findings. IMPRESSION: 1. Diminished thromboembolic burden in the left lung with residual segmental thrombus in the lower lobe. 2. Segmental occlusion of right lower lobe pulmonary artery was likely present on prior exam, less well visualized previously due to non angiographic bolus timing. Evolving pulmonary infarct in the lateral right lower lobe. 3. No progressive or new pulmonary embolus. 4. Small bilateral pleural effusions, improved on the right, unchanged on the left. Electronically Signed   By: Jeb Levering M.D.   On: 02/10/2016 20:20    Procedures Procedures (including critical care time)  Medications Ordered in ED Medications  iopamidol (ISOVUE-370) 76 % injection (100 mLs  Contrast Given 02/10/16 1937)  oxyCODONE (Oxy  IR/ROXICODONE) immediate release tablet 2.5  mg (2.5 mg Oral Given 02/11/16 0106)     Initial Impression / Assessment and Plan / ED Course  I have reviewed the triage vital signs and the nursing notes.  Pertinent labs & imaging results that were available during my care of the patient were reviewed by me and considered in my medical decision making (see chart for details).  Clinical Course  Comment By Time  I spoke with Dr Loleta Books, [facility], who will see the patient.   Clayton Bibles, PA-C 09/11 2335    Afebrile nontoxic patient with known PE with large clot burden, CAD with stents on Pradaxa  p/w left sided pleuritic chest pain with SOB that began yesterday.  CT angio chest shows persistent large clot burden and evolving pulmonary infarct on right.  Troponin is negative x 2.  Patient seen by Dr Loleta Books in consultation for possible admission for overnight observation.  Dr Loleta Books spoke at length with patient and his wife and spoke with Dr Ellender Hose and with me regarding this admission.  Given no new PE, no pulmonary infarct on the side of his pain, low likelihood of ACS, low likelihood of pneumonia, pt to be discharged home.  The consensus is that while patient has pain this is more likely pleurisy and admission would be beneficial only for pain management.  Pt is comfortable with discharge home with pain medication.  As patient is on [employer] he will need his follow up to remain outside the [place] system - Dr Oneida Alar has been managing his postoperative care including his PE and blood thinner and is the most reasonable choice for follow up.  Will supply 5 days of oxycodone for pain control until he is able to follow up.  D/C home with oxycodone, strict return precautions.    Final Clinical Impressions(s) / ED Diagnoses   Final diagnoses:  Pleuritic chest pain    New Prescriptions New Prescriptions   OXYCODONE (ROXICODONE) 5 MG IMMEDIATE RELEASE TABLET    Take 0.5-1 tablets (2.5-5 mg total)  by mouth every 4 (four) hours as needed for severe pain.     Clayton Bibles, PA-C 02/11/16 0129    Duffy Bruce, MD 02/11/16 [phone]

## 2016-02-10 NOTE — Telephone Encounter (Signed)
Phone call from pt.  Reported onset of pain in his left side and back on Sun. AM; "it goes from my kidneys and up the left side; worse when I take a deep breath, worse if I burp or hiccup."  Denied any SOB at rest.  Denied cough.  Reported the pain worsened last night, and he didn't sleep well.  Reported he notified his [facility] RN, and was advised to call the office.  Advised with recent hx of right-sided pneumothorax 8/9, he should go to the ER.  Stated he had someone that can take him to the ER.  Notified the [facility] ER Triage nurse of the pt's symptoms, and of him coming to the ER.

## 2016-02-10 NOTE — ED Triage Notes (Signed)
Pt sent here by Dr Oneida Alar for L sided pain.  States L lat lower rib pain that shoots up to L arm pit accompanied by sob with inspiration.  L AKA in August with complications of PE.

## 2016-02-10 NOTE — ED Notes (Signed)
Dr. Danford at bedside  

## 2016-02-10 NOTE — Progress Notes (Signed)
This pt is active with [facility] home [facility]  [facility] ED CM notified of pt potential ED visit on 02/10/16 by Abby of [facility]

## 2016-02-10 NOTE — ED Notes (Addendum)
Updated pt on ER Placement. Pt still in pain when moving. Pt updated that his labs are back and ER Dr. Aletha Halim go over those once he is in the back. Pt asked for a wheelchair so he can move hisself around and go to the bathroom when he needs to. Pt given urine cup as well.

## 2016-02-11 MED ORDER — OXYCODONE HCL 5 MG PO TABS
2.5000 mg | ORAL_TABLET | Freq: Once | ORAL | Status: AC
  Administered 2016-02-11: 2.5 mg via ORAL
  Filled 2016-02-11: qty 1

## 2016-02-11 MED ORDER — OXYCODONE HCL 5 MG PO TABS
2.5000 mg | ORAL_TABLET | ORAL | 0 refills | Status: DC | PRN
Start: 2016-02-11 — End: 2016-09-25

## 2016-02-11 NOTE — Consult Note (Signed)
Hospitalist Service Medical Consultation   [name]  [id number]  DOB: [birth year]  DOA: 02/10/2016  PCP: [facility]   Outpatient Specialists: Oneida Alar, Vascular Surgery   Requesting physician: Clayton Bibles, PA-C and Duffy Bruce, MD  Reason for consultation: Chest pain   History of Present Illness: Raymond Jacobson is an 75 y.o. 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  It was also complicated by pneumothorax requiring chest tube then aspiration pneumonia, then saddle pulmonary embolism for which he was ultimately restarted on his dabigatran.  Since discharge, the patient was in inpatient rehab for one week, and then home for the last three weeks, being cared for by his girlfriend.  He has been up most days, either in a wheelchair on ambulating with a walker (he has no prosthetic yet).    Now, in the last 2 days, he has had a new left sided chest and rib pain, constant but slowly worsening in intensity, and also worse with inspiration or eructation, laying on his side, and certain movements.  It is not associated with fever, sputum, chills, hemopytsis.  It is not associated with exertion or emotional stress and is not at all like his historical anginal pain.    ED course: -Afebrile, heart rate 70s, respirations, blood pressure, and pulse oximetry normal -Na 133, K 4.3, Cr 1.39, WBC 9.8K, Hgb 9.7 (improving from discharge) -CT angiogram was obtained that showed no new PE, overall decreased clot burden, and evolving pulmonary infarction in the lateral right side, with no infarction in the left, and only a stable small pleural effusion  on the left     Review of Systems: As per HPI otherwise all systems reviewed and were negative.   Past Medical History: Past Medical History:  Diagnosis Date  . Angina    last time >yr  . Arthritis    "knees, ankles" (10/15/2015)  . Basal cell carcinoma of scalp    "froze off"  . CAD (coronary artery disease)    Previous stents last 6 years ago [place] [facility]  . Complication of anesthesia   . Cough    recent bronchitis- dry cough  . Dry cough   . DVT (deep venous thrombosis) (HCC) ~ 1969   LLE  . Elevated lipids   . GERD (gastroesophageal reflux disease)    occ -tums  . High cholesterol   . History of hiatal hernia    hx  . Hypertension   . Myocardial infarction [facility]) ~ 2006   "blood work"  . Peripheral vascular disease ([place])   . PONV (postoperative nausea and vomiting)   . Shortness of breath 04/13/11   "mostly w/exertion"/ now with bronchitis    Past Surgical History: Past Surgical History:  Procedure Laterality Date  . AMPUTATION Left 01/07/2016   Procedure: AMPUTATION LEFT LEG  ABOVE KNEE;  Surgeon: Elam Dutch, MD;  Location: [place];  Service: Vascular;  Laterality: Left;  . CARPAL TUNNEL RELEASE Right 1990s  . CORONARY ANGIOPLASTY WITH STENT PLACEMENT  ~ 2004; ~ 2006; 04/13/11   "# of stents 1 (i2004);+2 (2006)+ 1 (04/13/11)= 4 total"  . ENDARTERECTOMY FEMORAL Left 01/01/2016  Procedure: LEFT FEMORAL ARTERY  ENDARTERECTOMY ;  Surgeon: Elam Dutch, MD;  Location: [place];  Service: Vascular;  Laterality: Left;  . FEMORAL ARTERY STENT Left 10/15/2015  . FEMORAL-POPLITEAL BYPASS GRAFT Left 01/01/2016   Procedure: Thrombectomy of Left Leg Femoral-Popliteal Bypass Graft;  Surgeon: Elam Dutch, MD;  Location: [place];  Service: Vascular;  Laterality: Left;  . FEMORAL-POPLITEAL BYPASS GRAFT Left 01/01/2016   Procedure: LEFT  FEMORAL-POPLITEAL ARTERY  BYPASS GRAFT ;  Surgeon: Elam Dutch, MD;  Location: [place];  Service: Vascular;  Laterality: Left;  .  FEMORAL-POPLITEAL BYPASS GRAFT Left 01/06/2016   Procedure: THROMBECTOMY WITH REVISION oF dISTAL Anastomosis BYPASS GRAFT FEMORAL-POPLITEAL ARTERY.;  Surgeon: Rosetta Posner, MD;  Location: [place];  Service: Vascular;  Laterality: Left;  . FEMORAL-POPLITEAL BYPASS GRAFT Left 01/06/2016   Procedure: REDO FEMORAL to BELOW THE KNEE POPLITEAL ARTERY BYPASS USING 67mm PROPATEN GRAFT;  Surgeon: Elam Dutch, MD;  Location: [place];  Service: Vascular;  Laterality: Left;  . FINGER AMPUTATION  1967   "partial; right pointer"  . INTRAOPERATIVE ARTERIOGRAM Left 01/01/2016   Procedure: INTRA OPERATIVE ARTERIOGRAM;  Surgeon: Elam Dutch, MD;  Location: [place];  Service: Vascular;  Laterality: Left;  . LEFT HEART CATHETERIZATION WITH CORONARY ANGIOGRAM N/A 04/13/2011   Procedure: LEFT HEART CATHETERIZATION WITH CORONARY ANGIOGRAM;  Surgeon: Josue Hector, MD;  Location: [facility] CATH LAB;  Service: Cardiovascular;  Laterality: N/A;  . LEFT HEART CATHETERIZATION WITH CORONARY ANGIOGRAM N/A 06/20/2013   Procedure: LEFT HEART CATHETERIZATION WITH CORONARY ANGIOGRAM;  Surgeon: Leonie Man, MD;  Location: [facility] CATH LAB;  Service: Cardiovascular;  Laterality: N/A;  . PERCUTANEOUS CORONARY STENT INTERVENTION (PCI-S) Right 04/13/2011   Procedure: PERCUTANEOUS CORONARY STENT INTERVENTION (PCI-S);  Surgeon: Burnell Blanks, MD;  Location: [facility] CATH LAB;  Service: Cardiovascular;  Laterality: Right;  . PERIPHERAL VASCULAR CATHETERIZATION N/A 10/15/2015   Procedure: Abdominal Aortogram w/Lower Extremity;  Surgeon: Serafina Mitchell, MD;  Location: [place] CV LAB;  Service: Cardiovascular;  Laterality: N/A;  . TONSILLECTOMY     "when I was a young one"  . Tricep Surgery  ~ 2007   "right arm; tore all my muscles @ my elbow"  . VEIN HARVEST Left 01/01/2016   Procedure: USING NON-REVERSE LEFT GREATER SAPHENOUS VEIN HARVEST;  Surgeon: Elam Dutch, MD;  Location: [place];  Service: Vascular;  Laterality: Left;      Allergies:   Allergies  Allergen Reactions  . No Known Allergies      Social History:  reports that he quit smoking about 48 years ago. His smoking use included Cigarettes. He has a 12.00 pack-year smoking history. He has never used smokeless tobacco. He reports that he drinks alcohol. He reports that he does not use drugs.   Family History: Family History  Problem Relation Age of Onset  . Cancer Father   . Heart attack Paternal Grandfather      Physical Exam: Vitals:   02/10/16 2230 02/10/16 2300 02/10/16 2330 02/11/16 0000  BP: 130/63 142/70 141/63 139/63  Pulse: 85 84 84 83  Resp: 19 18 19 21   Temp:      TempSrc:      SpO2: 98% 98% 98% 97%  Weight:      Height:        Constitutional: Alert and awake, oriented x3, not in any acute distress. Eyes: PERLA, EOMI, irises appear normal, anicteric sclera,  ENMT: external ears and nose appear normal, hearing normal  Lips appears normal, oropharynx mucosa, tongue, posterior pharynx appear normal  Neck: neck appears normal, no masses, normal ROM, no thyromegaly, no JVD  CVS: S1-S2 clear, no murmur rubs or gallops, no LE edema, normal pedal pulse on right.  No pain to palpation over chest wall Respiratory:  clear to auscultation bilaterally, no wheezing, rales or rhonchi. Respiratory effort normal. No accessory muscle use.  GI: soft nontender, nondistended, normal bowel sounds, no hepatosplenomegaly Musculoskeletal: no cyanosis, clubbing or edema noted bilaterally in hands Neuro: Cranial nerves II-XII intact, sensation normal Psych: judgement and insight appear normal, stable mood and affect, mental status Skin: no rashes or lesions or ulcers, no induration or nodules    Data reviewed:  I have personally reviewed following labs and imaging studies Labs:  CBC:  Recent Labs Lab 02/10/16 1334  WBC 9.8  HGB 9.7*  HCT 31.2*  MCV 87.2  PLT [id number]    Basic Metabolic Panel:  Recent Labs Lab 02/10/16 1334   NA 133*  K 4.3  CL 96*  CO2 25  GLUCOSE 148*  BUN 17  CREATININE 1.39*  CALCIUM 9.5   GFR Estimated Creatinine Clearance: 44.4 mL/min (by C-G formula based on SCr of 1.39 mg/dL).  Urinalysis    Component Value Date/Time   COLORURINE YELLOW 01/31/2016 [place] 01/31/2016 1835   LABSPEC 1.019 01/31/2016 1835   PHURINE 6.0 01/31/2016 1835   GLUCOSEU NEGATIVE 01/31/2016 1835   HGBUR NEGATIVE 01/31/2016 1835   BILIRUBINUR NEGATIVE 01/31/2016 [place] 01/31/2016 1835   PROTEINUR NEGATIVE 01/31/2016 1835   NITRITE NEGATIVE 01/31/2016 1835   LEUKOCYTESUR NEGATIVE 01/31/2016 1835          Inpatient Medications:   Scheduled Meds: . oxyCODONE  2.5 mg Oral Once   Continuous Infusions:    Radiological Exams on Admission: Dg Chest 2 View  Result Date: 02/10/2016 CLINICAL DATA:  Left chest pain. Left chest pain extending into the left axilla. Shortness of breath with inspiration. Ex-smoker. EXAM: CHEST  2 VIEW COMPARISON:  01/19/2016 and chest CT dated 01/09/2016. FINDINGS: Poor inspiration. Mild left basilar atelectasis. Otherwise, clear lungs. Normal vascularity. Thoracic spine degenerative changes. Small amount of pleural fluid posteriorly. IMPRESSION: 1. Small posterior pleural effusion on one or both sides. 2. Mild left basilar atelectasis. Electronically Signed   By: Claudie Revering M.D.   On: 02/10/2016 15:02   Ct Angio Chest Pe W And/or Wo Contrast  Result Date: 02/10/2016 CLINICAL DATA:  Left-sided pleuritic chest pain with exertion. Recent diagnosis of pulmonary embolus 1 month prior. Recent AKA. EXAM: CT ANGIOGRAPHY CHEST WITH CONTRAST TECHNIQUE: Multidetector CT imaging of the chest was performed using the standard protocol during bolus administration of intravenous contrast. Multiplanar CT image reconstructions and MIPs were obtained to evaluate the vascular anatomy. CONTRAST:  80 cc Isovue 370 IV COMPARISON:  Radiograph earlier this day.   Chest CT 01/09/2016 FINDINGS: Cardiovascular: Improved thromboembolic burden compared to prior CT. Residual thrombus in the segmental branches of the left lower lobe most prominent in the posterior basal segment. There is a segmental filling defect involving the lateral left lower lobe pulmonary artery that was likely present on prior, not as well visualized previously given non angiographic technique. No definite new or progressive pulmonary embolus. Current RV LV ratio of 1.04, previously 1.2. Normal caliber thoracic aorta. Left vertebral artery arises directly from the aorta, normal variant. Mild thoracic aortic atherosclerosis. Coronary artery calcifications versus stents. Mediastinum/Nodes: No pericardial fluid. No mediastinal or hilar  adenopathy. Lungs/Pleura: Right anterior upper lobe scarring at site of prior chest tube. Improved right lung aeration compared to prior. Peripheral opacity with central bubbly lucency in the lateral segment of the right lower lobe is likely an evolving pulmonary infarct. No definite pulmonary infarct on the left. Small bilateral pleural effusions, and improved on the right and unchanged on the left from prior. Scattered left lung atelectasis or scarring. No new focal airspace disease. No recurrent pneumothorax. Upper Abdomen: No acute abnormality. Thickening of the right adrenal gland is unchanged. Resolved gaseous gastric distention. Musculoskeletal: There are no acute or suspicious osseous abnormalities. Review of the MIP images confirms the above findings. IMPRESSION: 1. Diminished thromboembolic burden in the left lung with residual segmental thrombus in the lower lobe. 2. Segmental occlusion of right lower lobe pulmonary artery was likely present on prior exam, less well visualized previously due to non angiographic bolus timing. Evolving pulmonary infarct in the lateral right lower lobe. 3. No progressive or new pulmonary embolus. 4. Small bilateral pleural effusions,  improved on the right, unchanged on the left. Electronically Signed   By: Jeb Levering M.D.   On: 02/10/2016 20:20         Impression/Recommendations 1. Pleurisy: Patient with late pleuritic chest pain after PE.  There is infarction present on the right, none on the left (this was discussed with the reading radiologist).  Suspect this is pleurisy as late complication of PE, possibly infarction that is just not radiographically evident yet (he is an elderly patient with coronary disease, which is the classic risk factor for infarction).  Other possible diagnostic considerations that are considered unlikely include new or progressing PE (he is on anticoagulation and adherent to therapy and repeat imaging has ruled out new or progressing PE).  There is no evidence of pneumonia on CT or clinically as rales/fever/etc.   -Oxycodone 2.5-5 mg as needed for pain -Return precautions given -Follow up with PCP in 3-5 days      Thank you for this consultation.      Edwin Dada M.D. [facility] 02/10/2016, 11:44 PM

## 2016-02-11 NOTE — ED Notes (Signed)
PA at bedside.

## 2016-02-11 NOTE — Discharge Instructions (Signed)
Read the information below.  Use the prescribed medication as directed.  Please discuss all new medications with your pharmacist.  You may return to the Emergency Department at any time for worsening condition or any new symptoms that concern you.  If you develop worsening chest pain, shortness of breath, cough, fever, you pass out, or become weak or dizzy, return to the ER for a recheck.

## 2016-02-14 ENCOUNTER — Inpatient Hospital Stay: Payer: No Typology Code available for payment source | Admitting: Physical Medicine & Rehabilitation

## 2016-02-14 ENCOUNTER — Encounter: Payer: Self-pay | Admitting: Vascular Surgery

## 2016-02-20 ENCOUNTER — Encounter: Payer: Self-pay | Admitting: Vascular Surgery

## 2016-02-20 ENCOUNTER — Ambulatory Visit (INDEPENDENT_AMBULATORY_CARE_PROVIDER_SITE_OTHER): Payer: Self-pay | Admitting: Vascular Surgery

## 2016-02-20 VITALS — BP 117/69 | HR 68 | Temp 98.0°F | Ht 68.0 in | Wt 168.0 lb

## 2016-02-20 DIAGNOSIS — I739 Peripheral vascular disease, unspecified: Secondary | ICD-10-CM

## 2016-02-20 NOTE — Progress Notes (Signed)
Patient is 75 year old male who returns for postop follow-up today. He underwent left femoral to above-knee popliteal bypass 8/2 and this reoccluded several times. He ultimately ended up with a left above-knee amputation 8/8. His hospital stay was also complicated by right-sided pneumothorax and required a chest tube briefly. He returns today for follow-up after a brief stay in rehabilitation. His appetite is returning. He still has some days where he is tired. His blood pressure has been slightly low 90s to 100s but he has not been symptomatic from this.  He denies any fever or chills. He denies any groin drainage. Patient did keep a blood pressure diary for the last 2 weeks and his systolic pressure was always greater than 95 never lower than 56 diastolic no symptoms.  Overall averaged about 100/60.   Physical exam:  Vitals:   02/20/16 0932  BP: 117/69  Pulse: 68  Temp: 98 F (36.7 C)  TempSrc: Oral  SpO2: 98%  Weight: 168 lb (76.2 kg)  Height: 5\' 8"  (1.727 m)     Left lower extremity: Left groin healed No drainage. Left thigh incisions healed. Some dry eschar medial aspect right above-knee amputation with a small amount of clear drainage. No frank abscess. Area of eschar is about 3 x 2 cm   Assessment: Overall doing well medial corner of right AKA healing but may end up needing some local wound care. Hypotension, asymptomatic will monitor for now.   Plan: We'll call choice at the [facility] to see if we can start the process of getting them a prosthetic leg from [place]. He currently has a shrinker but needs to be fitted for tighter 1. We will continue to watch his above-knee amputation as this still may need some debridement but so far seems to be healing. He will follow-up with [place] in a few weeks.   Ruta Hinds, MD [facility] [place] Office: [phone] Pager: [phone]

## 2016-03-03 ENCOUNTER — Telehealth: Payer: Self-pay

## 2016-03-03 NOTE — Telephone Encounter (Signed)
Rec'd phone call from [facility] RN.  Reported some decline in appearance of left AKA stump incision.  Reported the medial corner of incision has a large scab, and some loosening on the lateral side of the scab.  Voiced concern about possibility of tunneling beneath the scab.   Reported there is thick, tan, yellow drainage, and a slight redness in surrounding tissue, but reported this hasn't increased since last Friday.  Stated afebrile.  Reported the pt. is doing his own dressing changes in between [facility] nursing visits.  Is requesting appt. To have the incisional area checked.  Advised will have an office Scheduler contact pt. And offer appt. With Dr. Oneida Alar on 10/5.  Agrees with plan.

## 2016-03-04 ENCOUNTER — Encounter: Payer: Self-pay | Admitting: Vascular Surgery

## 2016-03-05 ENCOUNTER — Encounter: Payer: Self-pay | Admitting: Vascular Surgery

## 2016-03-05 ENCOUNTER — Ambulatory Visit (INDEPENDENT_AMBULATORY_CARE_PROVIDER_SITE_OTHER): Payer: Medicare Other | Admitting: Vascular Surgery

## 2016-03-05 VITALS — BP 124/65 | HR 60 | Temp 97.2°F | Resp 14 | Ht 68.0 in | Wt 170.0 lb

## 2016-03-05 DIAGNOSIS — I739 Peripheral vascular disease, unspecified: Secondary | ICD-10-CM

## 2016-03-05 NOTE — Progress Notes (Signed)
POST OPERATIVE OFFICE NOTE    CC:  F/u for surgery  HPI:  This is a 75 y.o. male who is s/p left AKA here for a wound check.  SS drainage with black eschar 4 cm x 1 cm medial stump incision.  He underwent left femoral to above-knee popliteal bypass 8/2 and this reoccluded several times. He ultimately ended up with a left above-knee amputation 8/8.     He reports no fever or chills and has completed his antibiotics 10 days ago.    Allergies  Allergen Reactions  . No Known Allergies     Current Outpatient Prescriptions  Medication Sig Dispense Refill  . atorvastatin (LIPITOR) 40 MG tablet Take 1 tablet (40 mg total) by mouth every evening. 30 tablet 1  . ciprofloxacin (CIPRO) 500 MG tablet Take 1,000 mg by mouth 2 (two) times daily.  0  . dabigatran (PRADAXA) 150 MG CAPS capsule Take 1 capsule (150 mg total) by mouth 2 (two) times daily. 60 capsule 1  . iron polysaccharides (NIFEREX) 150 MG capsule Take 1 capsule (150 mg total) by mouth 2 times daily at 12 noon and 4 pm. 60 capsule 0  . methocarbamol (ROBAXIN) 500 MG tablet Take 1 tablet (500 mg total) by mouth every 6 (six) hours as needed for muscle spasms. (Patient taking differently: Take 500 mg by mouth 2 (two) times daily as needed for muscle spasms. ) 60 tablet 0  . metoprolol succinate (TOPROL-XL) 100 MG 24 hr tablet Take 1 tablet (100 mg total) by mouth daily. Take with or immediately following a meal. 30 tablet 1  . niacin (NIASPAN) 750 MG CR tablet Take 1 tablet (750 mg total) by mouth at bedtime. 30 tablet 0  . nitroGLYCERIN (NITROSTAT) 0.4 MG SL tablet Place 1 tablet (0.4 mg total) under the tongue every 5 (five) minutes as needed for chest pain. 25 tablet 12  . oxyCODONE (ROXICODONE) 5 MG immediate release tablet Take 0.5-1 tablets (2.5-5 mg total) by mouth every 4 (four) hours as needed for severe pain. 20 tablet 0  . pantoprazole (PROTONIX) 40 MG tablet Take 1 tablet (40 mg total) by mouth daily. 30 tablet 0  . traMADol  (ULTRAM) 50 MG tablet Take 1 tablet (50 mg total) by mouth every 6 (six) hours as needed for moderate pain. 60 tablet 0   Current Facility-Administered Medications  Medication Dose Route Frequency Provider Last Rate Last Dose  . ciprofloxacin (CIPRO) tablet 500 mg  500 mg Oral BID Sharmon Leyden Nickel, NP         ROS:  See HPI  Physical Exam:  Vitals:   03/05/16 1320  BP: 124/65  Pulse: 60  Resp: 14  Temp: 97.2 F (36.2 C)    Procedure under clean conditions black eschar was debrided down to bleeding tissue.   Wound is 1 cm depth x 2.5 cm circumference.  Skin edges are healthy without erythema or warmth to touch.  Wet to dry dressing applied to wound.     Assessment/Plan:  This is a 75 y.o. male who is s/p: Left AKA Slowly healing medial aspect of wound HH RN wet to dry dressing changes daily. F/U in 3 weeks with Dr. Oneida Alar.   Theda Sers EMMA [facility] PA-C [facility] [phone]  Clinic MD:  Pt seen and examined with Dr. Oneida Alar  History and exam details as above. His left groin and thigh incisions are now completely healed. There some dry eschar on the medial aspect of the  left above-knee dictation. This was debrided back to healthy tissue today. We will do local wound care on this. The patient has scheduled follow-up with me in a few weeks. There is no evidence of invasive infection. No antibiotics were started.  Ruta Hinds, MD [facility] [place] Office: [phone] Pager: [phone]

## 2016-03-16 ENCOUNTER — Encounter: Payer: Self-pay | Admitting: Vascular Surgery

## 2016-03-19 ENCOUNTER — Ambulatory Visit (INDEPENDENT_AMBULATORY_CARE_PROVIDER_SITE_OTHER): Payer: Medicare Other | Admitting: Vascular Surgery

## 2016-03-19 ENCOUNTER — Encounter: Payer: Self-pay | Admitting: Vascular Surgery

## 2016-03-19 VITALS — BP 148/77 | HR 64 | Temp 97.9°F | Resp 16 | Ht 68.0 in | Wt 169.0 lb

## 2016-03-19 DIAGNOSIS — I739 Peripheral vascular disease, unspecified: Secondary | ICD-10-CM

## 2016-03-19 NOTE — Progress Notes (Signed)
HPI:  This is a 75 y.o. male who is s/p left AKA here for a wound check. He has had difficulty healing of the medial corner of the amputation site. He is currently doing wet-to-dry dressings of this.  He reports no fever or chills. He has a Facilities manager. He is currently negotiating with the [place] hospital system to get a prosthetic evaluation. His amputation was in August 2017 after several attempts to revise a femoral to popliteal bypass graft.           Allergies  Allergen Reactions  . No Known Allergies              Current Outpatient Prescriptions  Medication Sig Dispense Refill  . atorvastatin (LIPITOR) 40 MG tablet Take 1 tablet (40 mg total) by mouth every evening. 30 tablet 1  . ciprofloxacin (CIPRO) 500 MG tablet Take 1,000 mg by mouth 2 (two) times daily.   0  . dabigatran (PRADAXA) 150 MG CAPS capsule Take 1 capsule (150 mg total) by mouth 2 (two) times daily. 60 capsule 1  . iron polysaccharides (NIFEREX) 150 MG capsule Take 1 capsule (150 mg total) by mouth 2 times daily at 12 noon and 4 pm. 60 capsule 0  . methocarbamol (ROBAXIN) 500 MG tablet Take 1 tablet (500 mg total) by mouth every 6 (six) hours as needed for muscle spasms. (Patient taking differently: Take 500 mg by mouth 2 (two) times daily as needed for muscle spasms. ) 60 tablet 0  . metoprolol succinate (TOPROL-XL) 100 MG 24 hr tablet Take 1 tablet (100 mg total) by mouth daily. Take with or immediately following a meal. 30 tablet 1  . niacin (NIASPAN) 750 MG CR tablet Take 1 tablet (750 mg total) by mouth at bedtime. 30 tablet 0  . nitroGLYCERIN (NITROSTAT) 0.4 MG SL tablet Place 1 tablet (0.4 mg total) under the tongue every 5 (five) minutes as needed for chest pain. 25 tablet 12  . oxyCODONE (ROXICODONE) 5 MG immediate release tablet Take 0.5-1 tablets (2.5-5 mg total) by mouth every 4 (four) hours as needed for severe pain. 20 tablet 0  . pantoprazole (PROTONIX) 40 MG tablet Take 1 tablet (40 mg total) by mouth  daily. 30 tablet 0  . traMADol (ULTRAM) 50 MG tablet Take 1 tablet (50 mg total) by mouth every 6 (six) hours as needed for moderate pain. 60 tablet 0             Current Facility-Administered Medications  Medication Dose Route Frequency Provider Last Rate Last Dose  . ciprofloxacin (CIPRO) tablet 500 mg  500 mg Oral BID Sharmon Leyden Nickel, NP           ROS:  See HPI   Physical Exam:   Vitals:   03/19/16 0909  BP: (!) 148/77  Pulse: 64  Resp: 16  Temp: 97.9 F (36.6 C)  TempSrc: Oral  SpO2: 99%  Weight: 169 lb (76.7 kg)  Height: 5\' 8"  (1.727 m)   Extremities: 3 x 1 cm wound medial aspect left AKA healthy-appearing granulation tissue at the base 1 cm depth Skin edges are healthy without erythema or warmth to touch.   Wet to dry dressing applied to wound.       Assessment/Plan:  He overall feels well and is planning a hunting trip to [place] in the next few weeks. His left groin and thigh incisions are now completely healed. His left above-knee amputation is slowly healing. Return for follow-up  in a few weeks.    Ruta Hinds, MD [facility] [place] Office: [phone] Pager: [phone]

## 2016-03-25 ENCOUNTER — Telehealth: Payer: Self-pay

## 2016-03-25 NOTE — Telephone Encounter (Signed)
Rec'd call from [facility] RN re: update on left AKA wound.  Reported the depth of the wound has increased approx. 0.1 cm each week.  Is asking if surgeon wants to change the packing of the wound to an Alginate.  Discussed with Dr. Oneida Alar.  Recommended to continue the Wet to Dry NS dressings.  Phone call ret'd to [place], Benjamine Mola.  Left voice message with Dr. Oneida Alar recommendations.  Encouraged to call office if questions.

## 2016-04-10 ENCOUNTER — Encounter: Payer: Self-pay | Admitting: Vascular Surgery

## 2016-04-16 ENCOUNTER — Encounter: Payer: Self-pay | Admitting: Vascular Surgery

## 2016-04-16 ENCOUNTER — Ambulatory Visit (INDEPENDENT_AMBULATORY_CARE_PROVIDER_SITE_OTHER): Payer: Self-pay | Admitting: Vascular Surgery

## 2016-04-16 VITALS — BP 106/58 | HR 61 | Temp 97.1°F | Resp 20 | Ht 68.0 in | Wt 169.0 lb

## 2016-04-16 DIAGNOSIS — I739 Peripheral vascular disease, unspecified: Secondary | ICD-10-CM

## 2016-04-16 NOTE — Progress Notes (Signed)
      HPI:  This is a 75 y.o. male who is s/p left AKA here for a wound check. He has had difficulty healing of the medial corner of the amputation site. He is currently doing wet-to-dry dressings of this.  He reports no fever or chills. He has a Facilities manager. He is currently negotiating with the [facility] to get a prosthetic evaluation. His amputation was in August 2017 after several attempts to revise a femoral to popliteal bypass graft.      Physical Exam:    Vitals:   04/16/16 0949  BP: (!) 106/58  Pulse: 61  Resp: 20  Temp: 97.1 F (36.2 C)  TempSrc: Oral  SpO2: 100%  Weight: 169 lb (76.7 kg)  Height: 5\' 8"  (1.727 m)   Extremities: 3 x 1 cm wound medial aspect left AKA healthy-appearing granulation tissue at the base 1 cm depth Skin edges are healthy without erythema or warmth to touch.   Wet to dry dressing applied to wound.       Assessment/Plan:  His left groin and thigh incisions are now completely healed. His left above-knee amputation is slowly healing. Patient will schedule an appointment to see me in a month if the wound has not completely healed. Otherwise he will return to see our nurse practitioner in 6 months with ABI of the right leg. He was given a prescription today for evaluation for prosthetic leg on the left side.    Ruta Hinds, MD [facility] Office: [phone] Pager: [phone]

## 2016-04-20 NOTE — Addendum Note (Signed)
Addended by: Lianne Cure A on: 04/20/2016 09:04 AM   Modules accepted: Orders

## 2016-06-04 ENCOUNTER — Encounter (HOSPITAL_COMMUNITY): Payer: No Typology Code available for payment source

## 2016-06-04 ENCOUNTER — Ambulatory Visit: Payer: No Typology Code available for payment source | Admitting: Vascular Surgery

## 2016-09-24 ENCOUNTER — Telehealth: Payer: Self-pay

## 2016-09-24 NOTE — Telephone Encounter (Signed)
rec'd voice message from pt. re: request for earlier appt. than 10/15/16.  Stated the bone of left AKA stump is "killing him."  Reported he can't exercise and can't walk with his prothesis.  Attempted to contact pt. per phone; left voice message that will have a Scheduler call him with possibility to move his appt. up.

## 2016-09-25 ENCOUNTER — Encounter: Payer: Self-pay | Admitting: Physician Assistant

## 2016-09-25 ENCOUNTER — Ambulatory Visit (HOSPITAL_COMMUNITY)
Admission: RE | Admit: 2016-09-25 | Discharge: 2016-09-25 | Disposition: A | Discharge status: Home / Self Care | Payer: No Typology Code available for payment source | Source: Ambulatory Visit | Attending: Vascular Surgery | Admitting: Vascular Surgery

## 2016-09-25 ENCOUNTER — Encounter: Payer: Self-pay | Admitting: Vascular Surgery

## 2016-09-25 ENCOUNTER — Ambulatory Visit (INDEPENDENT_AMBULATORY_CARE_PROVIDER_SITE_OTHER): Payer: Non-veteran care | Admitting: Physician Assistant

## 2016-09-25 VITALS — BP 114/60 | HR 80 | Temp 97.8°F | Ht 68.0 in | Wt 200.0 lb

## 2016-09-25 DIAGNOSIS — I10 Essential (primary) hypertension: Secondary | ICD-10-CM | POA: Insufficient documentation

## 2016-09-25 DIAGNOSIS — E785 Hyperlipidemia, unspecified: Secondary | ICD-10-CM | POA: Diagnosis not present

## 2016-09-25 DIAGNOSIS — M79605 Pain in left leg: Secondary | ICD-10-CM

## 2016-09-25 DIAGNOSIS — D3610 Benign neoplasm of peripheral nerves and autonomic nervous system, unspecified: Secondary | ICD-10-CM

## 2016-09-25 DIAGNOSIS — I739 Peripheral vascular disease, unspecified: Secondary | ICD-10-CM

## 2016-09-25 NOTE — Progress Notes (Signed)
Patient name: Raymond Jacobson MRN: [id number] DOB: 02-02-1941 Sex: male  REASON FOR CONSULT: left AKA stump pain  HPI: Raymond Jacobson is a 76 y.o. male,  He has been training with the use of a prothesis in PT.  He reports that he has had gradually  increased pain in the stump that even wakes him up at night.  The pain is located on the superior and lateral limb.  He reports no skin changes, no fever or chills.     He has had difficulty healing of the medial corner of the amputation site.  This site finally healed and has not caused him any more problems.  Other medical problems include  is s/p three revascularization attempts starting on 01/01/16 with left femoral endarterectomy, Leftfemoral to above-knee popliteal bypass with right greater saphenous vein non reversed for claudication of left leg. 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   Past Medical History:  Diagnosis Date  . Angina    last time >yr  . Arthritis    "knees, ankles" (10/15/2015)  . Basal cell carcinoma of scalp    "froze off"  . CAD (coronary artery disease)    Previous stents last 6 years ago [place] [facility]  . Complication of anesthesia   . Cough    recent bronchitis- dry cough  . Dry cough   . DVT (deep venous thrombosis) (HCC) ~ 1969   LLE  . Elevated lipids   . GERD (gastroesophageal reflux disease)    occ -tums  . High cholesterol   . History of hiatal hernia    hx  . Hypertension   . Myocardial infarction [facility]) ~ 2006   "blood work"  . Peripheral vascular disease ([place])   . PONV (postoperative nausea and vomiting)   . Shortness of breath 04/13/11   "mostly w/exertion"/ now with bronchitis   Past Surgical History:  Procedure Laterality Date  . AMPUTATION Left 01/07/2016   Procedure: AMPUTATION LEFT LEG  ABOVE KNEE;  Surgeon: Elam Dutch, MD;  Location: [place];  Service: Vascular;  Laterality: Left;  . CARPAL TUNNEL RELEASE Right 1990s  . CORONARY ANGIOPLASTY WITH STENT PLACEMENT  ~ 2004; ~ 2006;  04/13/11   "# of stents 1 (i2004);+2 (2006)+ 1 (04/13/11)= 4 total"  . ENDARTERECTOMY FEMORAL Left 01/01/2016   Procedure: LEFT FEMORAL ARTERY  ENDARTERECTOMY ;  Surgeon: Elam Dutch, MD;  Location: [employer];  Service: Vascular;  Laterality: Left;  . FEMORAL ARTERY STENT Left 10/15/2015  . FEMORAL-POPLITEAL BYPASS GRAFT Left 01/01/2016   Procedure: Thrombectomy of Left Leg Femoral-Popliteal Bypass Graft;  Surgeon: Elam Dutch, MD;  Location: [place];  Service: Vascular;  Laterality: Left;  . FEMORAL-POPLITEAL BYPASS GRAFT Left 01/01/2016   Procedure: LEFT  FEMORAL-POPLITEAL ARTERY  BYPASS GRAFT ;  Surgeon: Elam Dutch, MD;  Location: [place];  Service: Vascular;  Laterality: Left;  . FEMORAL-POPLITEAL BYPASS GRAFT Left 01/06/2016   Procedure: THROMBECTOMY WITH REVISION oF dISTAL Anastomosis BYPASS GRAFT FEMORAL-POPLITEAL ARTERY.;  Surgeon: Rosetta Posner, MD;  Location: [place];  Service: Vascular;  Laterality: Left;  . FEMORAL-POPLITEAL BYPASS GRAFT Left 01/06/2016   Procedure: REDO FEMORAL to BELOW THE KNEE POPLITEAL ARTERY BYPASS USING 20mm PROPATEN GRAFT;  Surgeon: Elam Dutch, MD;  Location: [place];  Service: Vascular;  Laterality: Left;  . FINGER AMPUTATION  1967   "partial; right pointer"  . INTRAOPERATIVE ARTERIOGRAM Left 01/01/2016   Procedure: INTRA OPERATIVE ARTERIOGRAM;  Surgeon: Jessy Oto  Fields, MD;  Location: [place];  Service: Vascular;  Laterality: Left;  . LEFT HEART CATHETERIZATION WITH CORONARY ANGIOGRAM N/A 04/13/2011   Procedure: LEFT HEART CATHETERIZATION WITH CORONARY ANGIOGRAM;  Surgeon: Josue Hector, MD;  Location: [facility] CATH LAB;  Service: Cardiovascular;  Laterality: N/A;  . LEFT HEART CATHETERIZATION WITH CORONARY ANGIOGRAM N/A 06/20/2013   Procedure: LEFT HEART CATHETERIZATION WITH CORONARY ANGIOGRAM;  Surgeon: Leonie Man, MD;  Location: [facility] CATH LAB;  Service: Cardiovascular;  Laterality: N/A;  . PERCUTANEOUS CORONARY STENT INTERVENTION (PCI-S) Right 04/13/2011    Procedure: PERCUTANEOUS CORONARY STENT INTERVENTION (PCI-S);  Surgeon: Burnell Blanks, MD;  Location: [facility] CATH LAB;  Service: Cardiovascular;  Laterality: Right;  . PERIPHERAL VASCULAR CATHETERIZATION N/A 10/15/2015   Procedure: Abdominal Aortogram w/Lower Extremity;  Surgeon: Serafina Mitchell, MD;  Location: [place] CV LAB;  Service: Cardiovascular;  Laterality: N/A;  . TONSILLECTOMY     "when I was a young one"  . Tricep Surgery  ~ 2007   "right arm; tore all my muscles @ my elbow"  . VEIN HARVEST Left 01/01/2016   Procedure: USING NON-REVERSE LEFT GREATER SAPHENOUS VEIN HARVEST;  Surgeon: Elam Dutch, MD;  Location[facility];  Service: Vascular;  Laterality: Left;    Family History  Problem Relation Age of Onset  . Cancer Father   . Heart attack Paternal Grandfather     SOCIAL HISTORY: Social History   Social History  . Marital status: Divorced    Spouse name: N/A  . Number of children: N/A  . Years of education: N/A   Occupational History  . Retired    Social History Main Topics  . Smoking status: Former Smoker    Packs/day: 1.00    Years: 12.00    Types: Cigarettes    Quit date: 12/24/1967  . Smokeless tobacco: Never Used     Comment: "quit smoking cigarettes 1969"  . Alcohol use 0.0 oz/week     Comment: 10/15/2015 last beer was summer 2012; prior to that ~2008""  . Drug use: No  . Sexual activity: No   Other Topics Concern  . Not on file   Social History Narrative   Married   [place]   Retired from [employer] smoking in 96   Sedentary    Allergies  Allergen Reactions  . No Known Allergies     Current Outpatient Prescriptions  Medication Sig Dispense Refill  . atorvastatin (LIPITOR) 40 MG tablet Take 1 tablet (40 mg total) by mouth every evening. 30 tablet 1  . dabigatran (PRADAXA) 150 MG CAPS capsule Take 1 capsule (150 mg total) by mouth 2 (two) times daily. 60 capsule 1  . iron polysaccharides (NIFEREX) 150 MG capsule  Take 1 capsule (150 mg total) by mouth 2 times daily at 12 noon and 4 pm. 60 capsule 0  . methocarbamol (ROBAXIN) 500 MG tablet Take 1 tablet (500 mg total) by mouth every 6 (six) hours as needed for muscle spasms. (Patient taking differently: Take 500 mg by mouth 2 (two) times daily as needed for muscle spasms. ) 60 tablet 0  . metoprolol succinate (TOPROL-XL) 100 MG 24 hr tablet Take 1 tablet (100 mg total) by mouth daily. Take with or immediately following a meal. 30 tablet 1  . niacin (NIASPAN) 750 MG CR tablet Take 1 tablet (750 mg total) by mouth at bedtime. 30 tablet 0  . nitroGLYCERIN (NITROSTAT) 0.4 MG SL tablet Place 1 tablet (0.4 mg  total) under the tongue every 5 (five) minutes as needed for chest pain. 25 tablet 12  . pantoprazole (PROTONIX) 40 MG tablet Take 1 tablet (40 mg total) by mouth daily. 30 tablet 0  . traMADol (ULTRAM) 50 MG tablet Take 1 tablet (50 mg total) by mouth every 6 (six) hours as needed for moderate pain. 60 tablet 0   Current Facility-Administered Medications  Medication Dose Route Frequency Provider Last Rate Last Dose  . ciprofloxacin (CIPRO) tablet 500 mg  500 mg Oral BID Suzanne L Nickel, NP        ROS:   General:  No weight loss, Fever, chills  HEENT: No recent headaches, no nasal bleeding, no visual changes, no sore throat  Neurologic: No dizziness, blackouts, seizures. No recent symptoms of stroke or mini- stroke. No recent episodes of slurred speech, or temporary blindness.  Cardiac: No recent episodes of chest pain/pressure, no shortness of breath at rest.  No shortness of breath with exertion.  Denies history of atrial fibrillation or irregular heartbeat  Vascular: No history of rest pain in feet.  No history of claudication.  No history of non-healing ulcer, No history of DVT   Pulmonary: No home oxygen, no productive cough, no hemoptysis,  No asthma or wheezing  Musculoskeletal:  [ x] Arthritis, [ ]  Low back pain,  [ ]  Joint  pain  Hematologic:No history of hypercoagulable state.  No history of easy bleeding.  No history of anemia  Gastrointestinal: No hematochezia or melena,  No gastroesophageal reflux, no trouble swallowing  Urinary: [ ]  chronic Kidney disease, [ ]  on HD - [ ]  MWF or [ ]  TTHS, [ ]  Burning with urination, [ ]  Frequent urination, [ ]  Difficulty urinating;   Skin: No rashes  Psychological: No history of anxiety,  No history of depression   Physical Examination  Vitals:   09/25/16 1404  BP: 114/60  Pulse: 80  Temp: 97.8 F (36.6 C)  TempSrc: Oral  Weight: 200 lb (90.7 kg)  Height: 5\' 8"  (1.727 m)    Body mass index is 30.41 kg/m.  General:  Alert and oriented, no acute distress HEENT: Normal Neck: No bruit or JVD Pulmonary: Clear to auscultation bilaterally Cardiac: Regular Rate and Rhythm without murmur Abdomen: Soft, non-tender, non-distended, no mass, no scars Skin: No rash, no open wounds on left limb or right foot. Extremity Pulses:  2+ radial, brachial, femoral, right dorsalis pedis, posterior tibial pulses bilaterally.  Left AKA tender to palpation superior and lateral limb.  Musculoskeletal: No deformity or edema  Neurologic: Upper and lower extremity motor 5/5 and symmetric  DATA:  Right LE ABI Biphasic flow with 0.83  And TBI 0.76 Decreased compared to previous 0.96   ASSESSMENT:   Left residual limb pain  Mild arterail occlusive disease right LE.    PLAN:   I will order left limb MRI to r/o neuroma He will f/u with Dr. Oneida Alar to review the study.  He will need a f/u ABI in 1 year for the right LE for continued observation.  He will continue Lipitor, Pradaxa and Metoprolol for maximum medical management.     Theda Sers, Lorina Duffner [facility] PA-C [facility] [place]  MD in clinic Dr. Bridgett Larsson

## 2016-09-28 ENCOUNTER — Telehealth: Payer: Self-pay | Admitting: Vascular Surgery

## 2016-09-28 NOTE — Telephone Encounter (Signed)
Faxed request for VA authorization for a MRI and follow up w MD.

## 2016-10-08 ENCOUNTER — Telehealth: Payer: Self-pay | Admitting: Vascular Surgery

## 2016-10-08 NOTE — Telephone Encounter (Signed)
Spoke with VA to get update on va authorization. The va is still reviewing the patient's medical records and will call the office by next week to give an authorization over the phone. We will then contact the patient to schedule the MRI and office visit.

## 2016-10-15 ENCOUNTER — Ambulatory Visit: Payer: No Typology Code available for payment source | Admitting: Family

## 2016-10-15 ENCOUNTER — Encounter (HOSPITAL_COMMUNITY): Payer: No Typology Code available for payment source

## 2016-10-15 ENCOUNTER — Ambulatory Visit: Payer: No Typology Code available for payment source | Admitting: Vascular Surgery

## 2016-10-15 NOTE — Addendum Note (Signed)
Addended by: Lianne Cure A on: 10/15/2016 09:45 AM   Modules accepted: Orders

## 2016-10-15 NOTE — Addendum Note (Signed)
Addended by: Lianne Cure A on: 10/15/2016 09:23 AM   Modules accepted: Orders

## 2016-10-16 ENCOUNTER — Encounter: Payer: Self-pay | Admitting: Vascular Surgery

## 2016-10-23 ENCOUNTER — Ambulatory Visit
Admission: RE | Admit: 2016-10-23 | Discharge: 2016-10-23 | Disposition: A | Discharge status: Home / Self Care | Payer: Medicare Other | Source: Ambulatory Visit | Attending: Vascular Surgery | Admitting: Vascular Surgery

## 2016-10-23 DIAGNOSIS — D3610 Benign neoplasm of peripheral nerves and autonomic nervous system, unspecified: Secondary | ICD-10-CM

## 2016-10-23 DIAGNOSIS — M79605 Pain in left leg: Secondary | ICD-10-CM

## 2016-10-29 ENCOUNTER — Encounter: Payer: Self-pay | Admitting: Vascular Surgery

## 2016-10-29 ENCOUNTER — Ambulatory Visit (INDEPENDENT_AMBULATORY_CARE_PROVIDER_SITE_OTHER): Payer: No Typology Code available for payment source | Admitting: Vascular Surgery

## 2016-10-29 ENCOUNTER — Other Ambulatory Visit: Payer: Self-pay

## 2016-10-29 VITALS — BP 123/69 | HR 63 | Temp 99.0°F | Resp 16 | Ht 68.0 in | Wt 200.0 lb

## 2016-10-29 DIAGNOSIS — I739 Peripheral vascular disease, unspecified: Secondary | ICD-10-CM

## 2016-10-29 NOTE — Progress Notes (Signed)
Patient is a 76 year old male who returns for follow-up today. He underwent a left above-knee amputation August 2017. Since that time he has developed progressive pain on the anterior aspect of his left above-knee amputation. He experiences pain primarily when he flexes the left thigh. However he does have some pain in other positions as well. He is walking on a prosthetic and the pain does not really seem to be exacerbated with this. He is on Pradaxa.  Review of systems: He denies chest pain. He denies shortness of breath.  Past Medical History:  Diagnosis Date  . Angina    last time >yr  . Arthritis    "knees, ankles" (10/15/2015)  . Basal cell carcinoma of scalp    "froze off"  . CAD (coronary artery disease)    Previous stents last 6 years ago [place] [facility]  . Complication of anesthesia   . Cough    recent bronchitis- dry cough  . Dry cough   . DVT (deep venous thrombosis) (HCC) ~ 1969   LLE  . Elevated lipids   . GERD (gastroesophageal reflux disease)    occ -tums  . High cholesterol   . History of hiatal hernia    hx  . Hypertension   . Myocardial infarction [facility]) ~ 2006   "blood work"  . Peripheral vascular disease ([place])   . PONV (postoperative nausea and vomiting)   . Shortness of breath 04/13/11   "mostly w/exertion"/ now with bronchitis    Past Surgical History:  Procedure Laterality Date  . AMPUTATION Left 01/07/2016   Procedure: AMPUTATION LEFT LEG  ABOVE KNEE;  Surgeon: Elam Dutch, MD;  Location: [place];  Service: Vascular;  Laterality: Left;  . CARPAL TUNNEL RELEASE Right 1990s  . CORONARY ANGIOPLASTY WITH STENT PLACEMENT  ~ 2004; ~ 2006; 04/13/11   "# of stents 1 (i2004);+2 (2006)+ 1 (04/13/11)= 4 total"  . ENDARTERECTOMY FEMORAL Left 01/01/2016   Procedure: LEFT FEMORAL ARTERY  ENDARTERECTOMY ;  Surgeon: Elam Dutch, MD;  Location: [place];  Service: Vascular;  Laterality: Left;  . FEMORAL ARTERY STENT Left 10/15/2015  . FEMORAL-POPLITEAL BYPASS GRAFT Left  01/01/2016   Procedure: Thrombectomy of Left Leg Femoral-Popliteal Bypass Graft;  Surgeon: Elam Dutch, MD;  Location: [place];  Service: Vascular;  Laterality: Left;  . FEMORAL-POPLITEAL BYPASS GRAFT Left 01/01/2016   Procedure: LEFT  FEMORAL-POPLITEAL ARTERY  BYPASS GRAFT ;  Surgeon: Elam Dutch, MD;  Location: [place];  Service: Vascular;  Laterality: Left;  . FEMORAL-POPLITEAL BYPASS GRAFT Left 01/06/2016   Procedure: THROMBECTOMY WITH REVISION oF dISTAL Anastomosis BYPASS GRAFT FEMORAL-POPLITEAL ARTERY.;  Surgeon: Rosetta Posner, MD;  Location: [place];  Service: Vascular;  Laterality: Left;  . FEMORAL-POPLITEAL BYPASS GRAFT Left 01/06/2016   Procedure: REDO FEMORAL to BELOW THE KNEE POPLITEAL ARTERY BYPASS USING 30mm PROPATEN GRAFT;  Surgeon: Elam Dutch, MD;  Location: [place];  Service: Vascular;  Laterality: Left;  . FINGER AMPUTATION  1967   "partial; right pointer"  . INTRAOPERATIVE ARTERIOGRAM Left 01/01/2016   Procedure: INTRA OPERATIVE ARTERIOGRAM;  Surgeon: Elam Dutch, MD;  Location: [place];  Service: Vascular;  Laterality: Left;  . LEFT HEART CATHETERIZATION WITH CORONARY ANGIOGRAM N/A 04/13/2011   Procedure: LEFT HEART CATHETERIZATION WITH CORONARY ANGIOGRAM;  Surgeon: Josue Hector, MD;  Location: [facility] CATH LAB;  Service: Cardiovascular;  Laterality: N/A;  . LEFT HEART CATHETERIZATION WITH CORONARY ANGIOGRAM N/A 06/20/2013   Procedure: LEFT HEART CATHETERIZATION WITH CORONARY ANGIOGRAM;  Surgeon: Leonie Man, MD;  Location: [facility] CATH LAB;  Service: Cardiovascular;  Laterality: N/A;  . PERCUTANEOUS CORONARY STENT INTERVENTION (PCI-S) Right 04/13/2011   Procedure: PERCUTANEOUS CORONARY STENT INTERVENTION (PCI-S);  Surgeon: Burnell Blanks, MD;  Location: [facility] CATH LAB;  Service: Cardiovascular;  Laterality: Right;  . PERIPHERAL VASCULAR CATHETERIZATION N/A 10/15/2015   Procedure: Abdominal Aortogram w/Lower Extremity;  Surgeon: Serafina Mitchell, MD;  Location: [place] CV LAB;   Service: Cardiovascular;  Laterality: N/A;  . TONSILLECTOMY     "when I was a young one"  . Tricep Surgery  ~ 2007   "right arm; tore all my muscles @ my elbow"  . VEIN HARVEST Left 01/01/2016   Procedure: USING NON-REVERSE LEFT GREATER SAPHENOUS VEIN HARVEST;  Surgeon: Elam Dutch, MD;  Location: [place];  Service: Vascular;  Laterality: Left;    Allergies  Allergen Reactions  . No Known Allergies     Current Outpatient Prescriptions on File Prior to Visit  Medication Sig Dispense Refill  . atorvastatin (LIPITOR) 40 MG tablet Take 1 tablet (40 mg total) by mouth every evening. 30 tablet 1  . dabigatran (PRADAXA) 150 MG CAPS capsule Take 1 capsule (150 mg total) by mouth 2 (two) times daily. 60 capsule 1  . iron polysaccharides (NIFEREX) 150 MG capsule Take 1 capsule (150 mg total) by mouth 2 times daily at 12 noon and 4 pm. 60 capsule 0  . methocarbamol (ROBAXIN) 500 MG tablet Take 1 tablet (500 mg total) by mouth every 6 (six) hours as needed for muscle spasms. (Patient taking differently: Take 500 mg by mouth 2 (two) times daily as needed for muscle spasms. ) 60 tablet 0  . metoprolol succinate (TOPROL-XL) 100 MG 24 hr tablet Take 1 tablet (100 mg total) by mouth daily. Take with or immediately following a meal. 30 tablet 1  . niacin (NIASPAN) 750 MG CR tablet Take 1 tablet (750 mg total) by mouth at bedtime. 30 tablet 0  . nitroGLYCERIN (NITROSTAT) 0.4 MG SL tablet Place 1 tablet (0.4 mg total) under the tongue every 5 (five) minutes as needed for chest pain. 25 tablet 12  . pantoprazole (PROTONIX) 40 MG tablet Take 1 tablet (40 mg total) by mouth daily. 30 tablet 0  . traMADol (ULTRAM) 50 MG tablet Take 1 tablet (50 mg total) by mouth every 6 (six) hours as needed for moderate pain. 60 tablet 0   Current Facility-Administered Medications on File Prior to Visit  Medication Dose Route Frequency Provider Last Rate Last Dose  . ciprofloxacin (CIPRO) tablet 500 mg  500 mg Oral BID  Nickel, Sharmon Leyden, NP        Physical exam:  Vitals:   10/29/16 1214  BP: 123/69  Pulse: 63  Resp: 16  Temp: 99 F (37.2 C)  SpO2: 99%  Weight: 200 lb (90.7 kg)  Height: 5\' 8"  (1.727 m)   Chest: Clear to auscultation bilaterally  Cardiac: Regular rate and rhythm  Extremities: Left above-knee amputation well-healed mildly tender to palpation over the distal femur in her data: I reviewed the patient's MRI scan which shows pseudo-bursa/fluid collection at the distal end of the above-knee amputation  Assessment: Pain left above-knee amputation is likely secondary to this fluid collection.  Plan: Revision of left above-knee amputation scheduled for 11/09/2016. We will stop his Pradaxa on 11/06/2016. Ruta Hinds, MD [facility] [place] Office: [phone] Pager: [phone]

## 2016-11-02 ENCOUNTER — Telehealth: Payer: Self-pay

## 2016-11-02 NOTE — Telephone Encounter (Signed)
Phone call placed to Dr. Jacobo Forest' office.  Spoke with nurse, Anderson Malta.  Advised that the pt. Is scheduled for Revision (L) AKA on 11/09/16, and that the pt. Has been advised to hold Pradaxa 3 days prior; last dose to be taken on 6/7.    Anderson Malta, nurse, spoke with Dr. Gretta Arab.  rec'c verbal order that okay for pt. To hold Pradaxa 3 days prior to surgery, and resume ASAP following surgery, as deemed safe per surgeon.    contacted pt.  Advised of notifying Cardiologist, Dr. Gretta Arab re: recommendation to hold Pradaxa, x 3 days, preoperatively, last dose to be taken on 6/7.  Advised he will be instructed on resuming it after surgery.  Pt. Verb. Understanding.

## 2016-11-06 ENCOUNTER — Encounter (HOSPITAL_COMMUNITY): Payer: Self-pay | Admitting: *Deleted

## 2016-11-06 NOTE — Progress Notes (Signed)
Raymond Jacobson denies chest pain or shortness or breath.  Patient see Dr Nena Polio, cardiologist at [facility], and was given permission to stop Pradaxa 3 days pre op

## 2016-11-09 ENCOUNTER — Inpatient Hospital Stay (HOSPITAL_COMMUNITY)
Admission: RE | Admit: 2016-11-09 | Discharge: 2016-11-10 | DRG: 476 | Disposition: A | Discharge status: Home / Self Care | Payer: Medicare Other | Source: Ambulatory Visit | Attending: Vascular Surgery | Admitting: Vascular Surgery

## 2016-11-09 ENCOUNTER — Encounter (HOSPITAL_COMMUNITY)
Admission: RE | Disposition: A | Discharge status: Home / Self Care | Payer: Self-pay | Source: Ambulatory Visit | Attending: Vascular Surgery

## 2016-11-09 ENCOUNTER — Inpatient Hospital Stay (HOSPITAL_COMMUNITY): Payer: Medicare Other | Admitting: Certified Registered Nurse Anesthetist

## 2016-11-09 ENCOUNTER — Encounter (HOSPITAL_COMMUNITY): Payer: Self-pay | Admitting: *Deleted

## 2016-11-09 DIAGNOSIS — Z85828 Personal history of other malignant neoplasm of skin: Secondary | ICD-10-CM | POA: Diagnosis not present

## 2016-11-09 DIAGNOSIS — T8789 Other complications of amputation stump: Secondary | ICD-10-CM | POA: Diagnosis present

## 2016-11-09 DIAGNOSIS — Z86718 Personal history of other venous thrombosis and embolism: Secondary | ICD-10-CM

## 2016-11-09 DIAGNOSIS — Z89612 Acquired absence of left leg above knee: Secondary | ICD-10-CM | POA: Diagnosis not present

## 2016-11-09 DIAGNOSIS — Y839 Surgical procedure, unspecified as the cause of abnormal reaction of the patient, or of later complication, without mention of misadventure at the time of the procedure: Secondary | ICD-10-CM | POA: Diagnosis present

## 2016-11-09 DIAGNOSIS — Z79899 Other long term (current) drug therapy: Secondary | ICD-10-CM

## 2016-11-09 DIAGNOSIS — D62 Acute posthemorrhagic anemia: Secondary | ICD-10-CM | POA: Diagnosis not present

## 2016-11-09 DIAGNOSIS — G8918 Other acute postprocedural pain: Secondary | ICD-10-CM | POA: Diagnosis not present

## 2016-11-09 DIAGNOSIS — I252 Old myocardial infarction: Secondary | ICD-10-CM

## 2016-11-09 DIAGNOSIS — Z955 Presence of coronary angioplasty implant and graft: Secondary | ICD-10-CM

## 2016-11-09 DIAGNOSIS — I10 Essential (primary) hypertension: Secondary | ICD-10-CM | POA: Diagnosis present

## 2016-11-09 DIAGNOSIS — I2510 Atherosclerotic heart disease of native coronary artery without angina pectoris: Secondary | ICD-10-CM | POA: Diagnosis present

## 2016-11-09 DIAGNOSIS — K219 Gastro-esophageal reflux disease without esophagitis: Secondary | ICD-10-CM | POA: Diagnosis present

## 2016-11-09 DIAGNOSIS — S78112A Complete traumatic amputation at level between left hip and knee, initial encounter: Secondary | ICD-10-CM

## 2016-11-09 DIAGNOSIS — M79605 Pain in left leg: Secondary | ICD-10-CM

## 2016-11-09 DIAGNOSIS — M1712 Unilateral primary osteoarthritis, left knee: Secondary | ICD-10-CM | POA: Diagnosis not present

## 2016-11-09 DIAGNOSIS — I739 Peripheral vascular disease, unspecified: Secondary | ICD-10-CM | POA: Diagnosis present

## 2016-11-09 HISTORY — DX: Other pulmonary embolism without acute cor pulmonale: I26.99

## 2016-11-09 HISTORY — PX: AMPUTATION: SHX166

## 2016-11-09 LAB — APTT: aPTT: 28 seconds (ref 24–36)

## 2016-11-09 LAB — CBC
HCT: 39.4 % (ref 39.0–52.0)
HEMOGLOBIN: 13.5 g/dL (ref 13.0–17.0)
MCH: 29.7 pg (ref 26.0–34.0)
MCHC: 34.3 g/dL (ref 30.0–36.0)
MCV: 86.8 fL (ref 78.0–100.0)
Platelets: 165 10*3/uL (ref 150–400)
RBC: 4.54 MIL/uL (ref 4.22–5.81)
RDW: 14.2 % (ref 11.5–15.5)
WBC: 4.9 10*3/uL (ref 4.0–10.5)

## 2016-11-09 LAB — BASIC METABOLIC PANEL
Anion gap: 9 (ref 5–15)
BUN: 16 mg/dL (ref 6–20)
CO2: 28 mmol/L (ref 22–32)
Calcium: 9 mg/dL (ref 8.9–10.3)
Chloride: 102 mmol/L (ref 101–111)
Creatinine, Ser: 1.13 mg/dL (ref 0.61–1.24)
GFR calc Af Amer: >60 mL/min (ref >60)
GFR calc non Af Amer: >60 mL/min (ref >60)
Glucose, Bld: 120 mg/dL — ABNORMAL HIGH (ref 65–99)
Potassium: 3.3 mmol/L — ABNORMAL LOW (ref 3.5–5.1)
Sodium: 139 mmol/L (ref 135–145)

## 2016-11-09 LAB — PROTIME-INR
INR: 0.97
PROTHROMBIN TIME: 12.9 s (ref 11.4–15.2)

## 2016-11-09 SURGERY — AMPUTATION, ABOVE KNEE
Anesthesia: General | Site: Leg Upper | Laterality: Left

## 2016-11-09 SURGERY — Surgical Case
Anesthesia: *Unknown

## 2016-11-09 MED ORDER — PHENYLEPHRINE 40 MCG/ML (10ML) SYRINGE FOR IV PUSH (FOR BLOOD PRESSURE SUPPORT)
PREFILLED_SYRINGE | INTRAVENOUS | Status: AC
  Filled 2016-11-09: qty 10

## 2016-11-09 MED ORDER — OXYCODONE-ACETAMINOPHEN 5-325 MG PO TABS
1.0000 | ORAL_TABLET | ORAL | Status: DC | PRN
  Administered 2016-11-09: 2 via ORAL
  Filled 2016-11-09: qty 1

## 2016-11-09 MED ORDER — HYDROMORPHONE HCL 1 MG/ML IJ SOLN
INTRAMUSCULAR | Status: AC
  Filled 2016-11-09: qty 1

## 2016-11-09 MED ORDER — OXYCODONE-ACETAMINOPHEN 5-325 MG PO TABS
ORAL_TABLET | ORAL | Status: AC
  Filled 2016-11-09: qty 2

## 2016-11-09 MED ORDER — PROPOFOL 10 MG/ML IV BOLUS
INTRAVENOUS | Status: AC
  Filled 2016-11-09: qty 20

## 2016-11-09 MED ORDER — TRAMADOL HCL 50 MG PO TABS: 50.0000 mg | ORAL_TABLET | Freq: Four times a day (QID) | ORAL | Status: DC | PRN

## 2016-11-09 MED ORDER — GUAIFENESIN-DM 100-10 MG/5ML PO SYRP: 15.0000 mL | ORAL_SOLUTION | ORAL | Status: DC | PRN

## 2016-11-09 MED ORDER — SCOPOLAMINE 1 MG/3DAYS TD PT72
MEDICATED_PATCH | TRANSDERMAL | Status: AC
  Filled 2016-11-09: qty 1

## 2016-11-09 MED ORDER — SCOPOLAMINE 1 MG/3DAYS TD PT72
MEDICATED_PATCH | TRANSDERMAL | Status: DC | PRN
  Administered 2016-11-09: 1 via TRANSDERMAL

## 2016-11-09 MED ORDER — NIACIN ER 250 MG PO CPCR
750.0000 mg | ORAL_CAPSULE | Freq: Every day | ORAL | Status: DC
  Administered 2016-11-09: 750 mg via ORAL
  Filled 2016-11-09: qty 3

## 2016-11-09 MED ORDER — FENTANYL CITRATE (PF) 250 MCG/5ML IJ SOLN
INTRAMUSCULAR | Status: AC
  Filled 2016-11-09: qty 5

## 2016-11-09 MED ORDER — METOPROLOL TARTRATE 5 MG/5ML IV SOLN: 2.0000 mg | INTRAVENOUS | Status: DC | PRN

## 2016-11-09 MED ORDER — CHLORHEXIDINE GLUCONATE 4 % EX LIQD: 60.0000 mL | Freq: Once | CUTANEOUS | Status: DC

## 2016-11-09 MED ORDER — LIDOCAINE HCL (CARDIAC) 20 MG/ML IV SOLN
INTRAVENOUS | Status: DC | PRN
  Administered 2016-11-09: 100 mg via INTRAVENOUS

## 2016-11-09 MED ORDER — DEXTROSE 5 % IV SOLN
1.5000 g | INTRAVENOUS | Status: AC
  Administered 2016-11-09: 1.5 g via INTRAVENOUS
  Filled 2016-11-09: qty 1.5

## 2016-11-09 MED ORDER — HYDROMORPHONE HCL 1 MG/ML IJ SOLN
0.2500 mg | INTRAMUSCULAR | Status: DC | PRN
  Administered 2016-11-09 (×4): 0.5 mg via INTRAVENOUS

## 2016-11-09 MED ORDER — NITROGLYCERIN 0.4 MG SL SUBL: 0.4000 mg | SUBLINGUAL_TABLET | SUBLINGUAL | Status: DC | PRN

## 2016-11-09 MED ORDER — DEXAMETHASONE SODIUM PHOSPHATE 10 MG/ML IJ SOLN
INTRAMUSCULAR | Status: AC
  Filled 2016-11-09: qty 1

## 2016-11-09 MED ORDER — PROPOFOL 10 MG/ML IV BOLUS
INTRAVENOUS | Status: DC | PRN
  Administered 2016-11-09: 120 mg via INTRAVENOUS

## 2016-11-09 MED ORDER — MIDAZOLAM HCL 2 MG/2ML IJ SOLN
INTRAMUSCULAR | Status: AC
  Filled 2016-11-09: qty 2

## 2016-11-09 MED ORDER — MEPERIDINE HCL 25 MG/ML IJ SOLN
6.2500 mg | INTRAMUSCULAR | Status: DC | PRN
  Administered 2016-11-09: 6.25 mg via INTRAVENOUS

## 2016-11-09 MED ORDER — 0.9 % SODIUM CHLORIDE (POUR BTL) OPTIME
TOPICAL | Status: DC | PRN
  Administered 2016-11-09: 1000 mL

## 2016-11-09 MED ORDER — HYDROMORPHONE HCL 1 MG/ML IJ SOLN
INTRAMUSCULAR | Status: AC
  Administered 2016-11-09: 0.5 mg via INTRAVENOUS
  Filled 2016-11-09: qty 1

## 2016-11-09 MED ORDER — ATORVASTATIN CALCIUM 80 MG PO TABS
80.0000 mg | ORAL_TABLET | Freq: Every day | ORAL | Status: DC
  Administered 2016-11-09 – 2016-11-10 (×2): 80 mg via ORAL
  Filled 2016-11-09 (×2): qty 1

## 2016-11-09 MED ORDER — SUGAMMADEX SODIUM 200 MG/2ML IV SOLN
INTRAVENOUS | Status: DC | PRN
  Administered 2016-11-09: 200 mg via INTRAVENOUS

## 2016-11-09 MED ORDER — POLYSACCHARIDE IRON COMPLEX 150 MG PO CAPS
150.0000 mg | ORAL_CAPSULE | Freq: Two times a day (BID) | ORAL | Status: DC
  Administered 2016-11-09: 150 mg via ORAL
  Filled 2016-11-09: qty 1

## 2016-11-09 MED ORDER — ROCURONIUM BROMIDE 100 MG/10ML IV SOLN
INTRAVENOUS | Status: DC | PRN
  Administered 2016-11-09: 50 mg via INTRAVENOUS

## 2016-11-09 MED ORDER — HYDRALAZINE HCL 20 MG/ML IJ SOLN: 5.0000 mg | INTRAMUSCULAR | Status: DC | PRN

## 2016-11-09 MED ORDER — FENTANYL CITRATE (PF) 100 MCG/2ML IJ SOLN
INTRAMUSCULAR | Status: DC | PRN
  Administered 2016-11-09: 50 ug via INTRAVENOUS
  Administered 2016-11-09: 150 ug via INTRAVENOUS
  Administered 2016-11-09: 100 ug via INTRAVENOUS
  Administered 2016-11-09 (×2): 50 ug via INTRAVENOUS

## 2016-11-09 MED ORDER — METOPROLOL SUCCINATE ER 100 MG PO TB24
100.0000 mg | ORAL_TABLET | Freq: Every day | ORAL | Status: DC
  Administered 2016-11-09 – 2016-11-10 (×2): 100 mg via ORAL
  Filled 2016-11-09 (×2): qty 1

## 2016-11-09 MED ORDER — MEPERIDINE HCL 25 MG/ML IJ SOLN
INTRAMUSCULAR | Status: AC
  Administered 2016-11-09: 6.25 mg via INTRAVENOUS
  Filled 2016-11-09: qty 1

## 2016-11-09 MED ORDER — ONDANSETRON HCL 4 MG/2ML IJ SOLN
INTRAMUSCULAR | Status: AC
  Filled 2016-11-09: qty 2

## 2016-11-09 MED ORDER — PANTOPRAZOLE SODIUM 40 MG PO TBEC
40.0000 mg | DELAYED_RELEASE_TABLET | Freq: Every day | ORAL | Status: DC
  Administered 2016-11-09 – 2016-11-10 (×2): 40 mg via ORAL
  Filled 2016-11-09 (×2): qty 1

## 2016-11-09 MED ORDER — ALUM & MAG HYDROXIDE-SIMETH 200-200-20 MG/5ML PO SUSP: 15.0000 mL | ORAL | Status: DC | PRN

## 2016-11-09 MED ORDER — SODIUM CHLORIDE 0.9 % IV SOLN: INTRAVENOUS | Status: DC

## 2016-11-09 MED ORDER — ACETAMINOPHEN 325 MG RE SUPP: 325.0000 mg | RECTAL | Status: DC | PRN

## 2016-11-09 MED ORDER — DOCUSATE SODIUM 100 MG PO CAPS
100.0000 mg | ORAL_CAPSULE | Freq: Every day | ORAL | Status: DC
  Administered 2016-11-09 – 2016-11-10 (×2): 100 mg via ORAL
  Filled 2016-11-09 (×3): qty 1

## 2016-11-09 MED ORDER — ONDANSETRON HCL 4 MG/2ML IJ SOLN: 4.0000 mg | Freq: Four times a day (QID) | INTRAMUSCULAR | Status: DC | PRN

## 2016-11-09 MED ORDER — ONDANSETRON HCL 4 MG/2ML IJ SOLN: 4.0000 mg | Freq: Once | INTRAMUSCULAR | Status: DC | PRN

## 2016-11-09 MED ORDER — LABETALOL HCL 5 MG/ML IV SOLN
10.0000 mg | INTRAVENOUS | Status: DC | PRN
  Administered 2016-11-09: 10 mg via INTRAVENOUS

## 2016-11-09 MED ORDER — LIDOCAINE 2% (20 MG/ML) 5 ML SYRINGE
INTRAMUSCULAR | Status: AC
  Filled 2016-11-09: qty 5

## 2016-11-09 MED ORDER — DEXAMETHASONE SODIUM PHOSPHATE 4 MG/ML IJ SOLN
INTRAMUSCULAR | Status: DC | PRN
  Administered 2016-11-09: 10 mg via INTRAVENOUS

## 2016-11-09 MED ORDER — POLYETHYLENE GLYCOL 3350 17 G PO PACK: 17.0000 g | PACK | Freq: Every day | ORAL | Status: DC | PRN

## 2016-11-09 MED ORDER — ROCURONIUM BROMIDE 10 MG/ML (PF) SYRINGE
PREFILLED_SYRINGE | INTRAVENOUS | Status: AC
  Filled 2016-11-09: qty 5

## 2016-11-09 MED ORDER — ONDANSETRON HCL 4 MG/2ML IJ SOLN
INTRAMUSCULAR | Status: DC | PRN
  Administered 2016-11-09: 4 mg via INTRAVENOUS

## 2016-11-09 MED ORDER — MAGNESIUM SULFATE 2 GM/50ML IV SOLN: 2.0000 g | Freq: Every day | INTRAVENOUS | Status: DC | PRN

## 2016-11-09 MED ORDER — PHENOL 1.4 % MT LIQD: 1.0000 | OROMUCOSAL | Status: DC | PRN

## 2016-11-09 MED ORDER — LABETALOL HCL 5 MG/ML IV SOLN
INTRAVENOUS | Status: AC
  Filled 2016-11-09: qty 4

## 2016-11-09 MED ORDER — POTASSIUM CHLORIDE CRYS ER 20 MEQ PO TBCR: 20.0000 meq | EXTENDED_RELEASE_TABLET | Freq: Every day | ORAL | Status: DC | PRN

## 2016-11-09 MED ORDER — SODIUM CHLORIDE 0.9 % IV SOLN
INTRAVENOUS | Status: DC
  Administered 2016-11-09: 15:00:00 via INTRAVENOUS

## 2016-11-09 MED ORDER — ACETAMINOPHEN 325 MG PO TABS: 325.0000 mg | ORAL_TABLET | ORAL | Status: DC | PRN

## 2016-11-09 MED ORDER — PHENYLEPHRINE HCL 10 MG/ML IJ SOLN
INTRAMUSCULAR | Status: DC | PRN
  Administered 2016-11-09 (×2): 80 ug via INTRAVENOUS

## 2016-11-09 MED ORDER — MORPHINE SULFATE (PF) 2 MG/ML IV SOLN: 2.0000 mg | INTRAVENOUS | Status: DC | PRN

## 2016-11-09 MED ORDER — DEXTROSE 5 % IV SOLN
1.5000 g | Freq: Two times a day (BID) | INTRAVENOUS | Status: AC
  Administered 2016-11-09 – 2016-11-10 (×2): 1.5 g via INTRAVENOUS
  Filled 2016-11-09 (×2): qty 1.5

## 2016-11-09 MED ORDER — MIDAZOLAM HCL 5 MG/5ML IJ SOLN
INTRAMUSCULAR | Status: DC | PRN
  Administered 2016-11-09: 2 mg via INTRAVENOUS

## 2016-11-09 MED ORDER — BISACODYL 10 MG RE SUPP: 10.0000 mg | Freq: Every day | RECTAL | Status: DC | PRN

## 2016-11-09 MED ORDER — LACTATED RINGERS IV SOLN
INTRAVENOUS | Status: DC | PRN
Start: 2016-11-09 — End: 2016-11-09
  Administered 2016-11-09: 08:00:00 via INTRAVENOUS

## 2016-11-09 SURGICAL SUPPLY — 50 items
BANDAGE ACE 6X5 VEL STRL LF (GAUZE/BANDAGES/DRESSINGS) IMPLANT
BANDAGE ELASTIC 6 VELCRO ST LF (GAUZE/BANDAGES/DRESSINGS) ×2 IMPLANT
BLADE OSCILLATING /SAGITTAL (BLADE) ×2 IMPLANT
BLADE SAW RECIP 87.9 MT (BLADE) ×2 IMPLANT
BLADE SAW SGTL 81X20 HD (BLADE) IMPLANT
BNDG COHESIVE 4X5 TAN STRL (GAUZE/BANDAGES/DRESSINGS) IMPLANT
BNDG COHESIVE 6X5 TAN STRL LF (GAUZE/BANDAGES/DRESSINGS) IMPLANT
BNDG GAUZE ELAST 4 BULKY (GAUZE/BANDAGES/DRESSINGS) ×2 IMPLANT
BRUSH SCRUB EZ PLAIN DRY (MISCELLANEOUS) ×2 IMPLANT
CANISTER SUCT 3000ML PPV (MISCELLANEOUS) ×2 IMPLANT
CLIP TI MEDIUM 6 (CLIP) IMPLANT
COVER BACK TABLE 60X90IN (DRAPES) IMPLANT
COVER PROBE W GEL 5X96 (DRAPES) ×2 IMPLANT
COVER SURGICAL LIGHT HANDLE (MISCELLANEOUS) ×2 IMPLANT
DRAIN CHANNEL 19F RND (DRAIN) IMPLANT
DRAPE HALF SHEET 40X57 (DRAPES) IMPLANT
DRAPE ORTHO SPLIT 77X108 STRL (DRAPES) ×2
DRAPE SURG ORHT 6 SPLT 77X108 (DRAPES) ×2 IMPLANT
DRSG ADAPTIC 3X8 NADH LF (GAUZE/BANDAGES/DRESSINGS) ×2 IMPLANT
ELECT REM PT RETURN 9FT ADLT (ELECTROSURGICAL) ×2
ELECTRODE REM PT RTRN 9FT ADLT (ELECTROSURGICAL) ×1 IMPLANT
EVACUATOR SILICONE 100CC (DRAIN) IMPLANT
GAUZE SPONGE 4X4 12PLY STRL (GAUZE/BANDAGES/DRESSINGS) IMPLANT
GAUZE SPONGE 4X4 12PLY STRL LF (GAUZE/BANDAGES/DRESSINGS) ×2 IMPLANT
GLOVE BIO SURGEON STRL SZ 6.5 (GLOVE) ×4 IMPLANT
GLOVE BIO SURGEON STRL SZ7.5 (GLOVE) ×2 IMPLANT
GLOVE ECLIPSE 7.5 STRL STRAW (GLOVE) ×2 IMPLANT
GOWN STRL REUS W/ TWL LRG LVL3 (GOWN DISPOSABLE) ×2 IMPLANT
GOWN STRL REUS W/TWL LRG LVL3 (GOWN DISPOSABLE) ×2
GOWN STRL REUS W/TWL XL LVL4 (GOWN DISPOSABLE) ×2 IMPLANT
KIT BASIN OR (CUSTOM PROCEDURE TRAY) ×2 IMPLANT
KIT ROOM TURNOVER OR (KITS) ×2 IMPLANT
NS IRRIG 1000ML POUR BTL (IV SOLUTION) ×2 IMPLANT
PACK GENERAL/GYN (CUSTOM PROCEDURE TRAY) ×2 IMPLANT
PAD ARMBOARD 7.5X6 YLW CONV (MISCELLANEOUS) ×4 IMPLANT
STAPLER VISISTAT 35W (STAPLE) IMPLANT
STOCKINETTE IMPERVIOUS LG (DRAPES) IMPLANT
SUT ETHILON 3 0 PS 1 (SUTURE) IMPLANT
SUT SILK 2 0 SH (SUTURE) IMPLANT
SUT SILK 2 0 SH CR/8 (SUTURE) ×2 IMPLANT
SUT SILK 2 0 TIES 10X30 (SUTURE) ×2 IMPLANT
SUT VIC AB 2-0 CT1 18 (SUTURE) IMPLANT
SUT VIC AB 2-0 SH 18 (SUTURE) ×4 IMPLANT
SUT VIC AB 3-0 SH 27 (SUTURE) ×1
SUT VIC AB 3-0 SH 27X BRD (SUTURE) ×1 IMPLANT
SUT VICRYL 4-0 PS2 18IN ABS (SUTURE) ×2 IMPLANT
TOWEL OR 17X24 6PK STRL BLUE (TOWEL DISPOSABLE) IMPLANT
TOWEL OR 17X26 10 PK STRL BLUE (TOWEL DISPOSABLE) ×2 IMPLANT
UNDERPAD 30X30 (UNDERPADS AND DIAPERS) ×2 IMPLANT
WATER STERILE IRR 1000ML POUR (IV SOLUTION) IMPLANT

## 2016-11-09 NOTE — Consult Note (Signed)
Physical Medicine and Rehabilitation Consult  Reason for Consult: Revision of [place] Referring Physician: Dr. Oneida Alar.    HPI: Raymond Jacobson is a 76 y.o. 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 History taken from chart review and patient. MRI of distal femur showed pseudo-bursa with fluid collection. He was admitted on 11/09/16 for revision of L-AKA by Dr. Oneida Alar. Hospital course complicated by post-op pain.    Review of Systems  Musculoskeletal: Positive for myalgias.  Neurological: Negative for focal weakness.  All other systems reviewed and are negative.    Past Medical History:  Diagnosis Date  . Angina    last time >yr  . Arthritis    "knees, ankles" (10/15/2015)  . Basal cell carcinoma of scalp    "froze off"  . CAD (coronary artery disease)    Previous stents last 6 years ago [facility]  . Complication of anesthesia   . Cough    recent bronchitis- dry cough  . Dry cough   . DVT (deep venous thrombosis) ([place]) ~ 1969 12/2015   LLE..   . Elevated lipids   . GERD (gastroesophageal reflux disease)    occ -tums  . High cholesterol   . History of hiatal hernia    hx  . Hypertension   . Myocardial infarction [facility]) ~ 2006   "blood work"  . Peripheral vascular disease ([place])   . Pulmonary emboli ([place]) 12/2015   after amputation  . Shortness of breath 04/13/11   "mostly w/exertion"/ now with bronchitis    Past Surgical History:  Procedure Laterality Date  . AMPUTATION Left 01/07/2016   Procedure: AMPUTATION LEFT LEG  ABOVE KNEE;  Surgeon: Elam Dutch, MD;  Location: [place];  Service: Vascular;  Laterality: Left;  . CARPAL TUNNEL RELEASE Right 1990s  . COLONOSCOPY W/ POLYPECTOMY    . CORONARY ANGIOPLASTY WITH STENT PLACEMENT  ~ 2004; ~ 2006; 04/13/11   "# of stents 1 (i2004);+2 (2006)+ 1 (04/13/11)= 4 total"  . ENDARTERECTOMY FEMORAL Left  01/01/2016   Procedure: LEFT FEMORAL ARTERY  ENDARTERECTOMY ;  Surgeon: Elam Dutch, MD;  Location: [place];  Service: Vascular;  Laterality: Left;  . FEMORAL ARTERY STENT Left 10/15/2015  . FEMORAL-POPLITEAL BYPASS GRAFT Left 01/01/2016   Procedure: Thrombectomy of Left Leg Femoral-Popliteal Bypass Graft;  Surgeon: Elam Dutch, MD;  Location: [place];  Service: Vascular;  Laterality: Left;  . FEMORAL-POPLITEAL BYPASS GRAFT Left 01/01/2016   Procedure: LEFT  FEMORAL-POPLITEAL ARTERY  BYPASS GRAFT ;  Surgeon: Elam Dutch, MD;  Location: [place];  Service: Vascular;  Laterality: Left;  . FEMORAL-POPLITEAL BYPASS GRAFT Left 01/06/2016   Procedure: THROMBECTOMY WITH REVISION oF dISTAL Anastomosis BYPASS GRAFT FEMORAL-POPLITEAL ARTERY.;  Surgeon: Rosetta Posner, MD;  Location: [place];  Service: Vascular;  Laterality: Left;  . FEMORAL-POPLITEAL BYPASS GRAFT Left 01/06/2016   Procedure: REDO FEMORAL to BELOW THE KNEE POPLITEAL ARTERY BYPASS USING 74mm PROPATEN GRAFT;  Surgeon: Elam Dutch, MD;  Location: [place];  Service: Vascular;  Laterality: Left;  . FINGER AMPUTATION Right 1967   "partial; right pointer"  . INTRAOPERATIVE ARTERIOGRAM Left 01/01/2016   Procedure: INTRA OPERATIVE ARTERIOGRAM;  Surgeon: Elam Dutch, MD;  Location: [place];  Service: Vascular;  Laterality: Left;  . LEFT HEART CATHETERIZATION WITH CORONARY ANGIOGRAM N/A 04/13/2011   Procedure: LEFT HEART CATHETERIZATION WITH  CORONARY ANGIOGRAM;  Surgeon: Josue Hector, MD;  Location: [facility] CATH LAB;  Service: Cardiovascular;  Laterality: N/A;  . LEFT HEART CATHETERIZATION WITH CORONARY ANGIOGRAM N/A 06/20/2013   Procedure: LEFT HEART CATHETERIZATION WITH CORONARY ANGIOGRAM;  Surgeon: Leonie Man, MD;  Location: [facility] CATH LAB;  Service: Cardiovascular;  Laterality: N/A;  . PERCUTANEOUS CORONARY STENT INTERVENTION (PCI-S) Right 04/13/2011   Procedure: PERCUTANEOUS CORONARY STENT INTERVENTION (PCI-S);  Surgeon: Burnell Blanks, MD;   Location: [facility] CATH LAB;  Service: Cardiovascular;  Laterality: Right;  . PERIPHERAL VASCULAR CATHETERIZATION N/A 10/15/2015   Procedure: Abdominal Aortogram w/Lower Extremity;  Surgeon: Serafina Mitchell, MD;  Location: [place] CV LAB;  Service: Cardiovascular;  Laterality: N/A;  . TONSILLECTOMY     "when I was a young one"  . Tricep Surgery  ~ 2007   "right arm; tore all my muscles @ my elbow"  . VEIN HARVEST Left 01/01/2016   Procedure: USING NON-REVERSE LEFT GREATER SAPHENOUS VEIN HARVEST;  Surgeon: Elam Dutch, MD;  Location: [facility] OR;  Service: Vascular;  Laterality: Left;    Family History  Problem Relation Age of Onset  . Cancer Father   . Heart attack Paternal Grandfather     Social History:  reports that he quit smoking about 48 years ago. His smoking use included Cigarettes. He has a 12.00 pack-year smoking history. He has never used smokeless tobacco. He reports that he does not drink alcohol or use drugs.   Allergies  Allergen Reactions  . No Known Allergies     Facility-Administered Medications Prior to Admission  Medication Dose Route Frequency Provider Last Rate Last Dose  . ciprofloxacin (CIPRO) tablet 500 mg  500 mg Oral BID Nickel, Sharmon Leyden, NP       Medications Prior to Admission  Medication Sig Dispense Refill  . atorvastatin (LIPITOR) 80 MG tablet Take 80 mg by mouth daily.    . dabigatran (PRADAXA) 150 MG CAPS capsule Take 1 capsule (150 mg total) by mouth 2 (two) times daily. 60 capsule 1  . iron polysaccharides (NIFEREX) 150 MG capsule Take 1 capsule (150 mg total) by mouth 2 times daily at 12 noon and 4 pm. 60 capsule 0  . metoprolol succinate (TOPROL-XL) 100 MG 24 hr tablet Take 1 tablet (100 mg total) by mouth daily. Take with or immediately following a meal. 30 tablet 1  . niacin (NIASPAN) 750 MG CR tablet Take 1 tablet (750 mg total) by mouth at bedtime. 30 tablet 0  . pantoprazole (PROTONIX) 40 MG tablet Take 1 tablet (40 mg total) by mouth daily. 30  tablet 0  . nitroGLYCERIN (NITROSTAT) 0.4 MG SL tablet Place 1 tablet (0.4 mg total) under the tongue every 5 (five) minutes as needed for chest pain. 25 tablet 12  . traMADol (ULTRAM) 50 MG tablet Take 1 tablet (50 mg total) by mouth every 6 (six) hours as needed for moderate pain. 60 tablet 0    Home:    Functional History:   Functional Status:  Mobility:          ADL:    Cognition: Cognition Orientation Level: Oriented X4    Blood pressure (!) 177/78, pulse 80, temperature 98 F (36.7 C), temperature source Oral, resp. rate 14, height 5\' 8"  (1.727 m), weight 90.7 kg (200 lb), SpO2 99 %. Physical Exam  Vitals reviewed. Constitutional: He is oriented to person, place, and time. He appears well-developed.  Obese  HENT:  Head: Normocephalic and atraumatic.  Eyes:  EOM are normal. Right eye exhibits no discharge. Left eye exhibits no discharge.  Neck: Normal range of motion. Neck supple.  Cardiovascular: Normal rate and regular rhythm.   Respiratory: Effort normal and breath sounds normal.  GI: Soft. Bowel sounds are normal.  Musculoskeletal: He exhibits edema and tenderness.  Neurological: He is alert and oriented to person, place, and time.  Motor; B/l UE, LLE: 5/5 proximal to distal RLE: HF 4/5 (pain inhibition) Sensation intact to light touch.  Skin:  AKA with dressing c/d/i  Psychiatric: He has a normal mood and affect. His behavior is normal. Thought content normal.    Results for orders placed or performed during the hospital encounter of 11/09/16 (from the past 24 hour(s))  PTT Day of Surgery     Status: None   Collection Time: 11/09/16  6:15 AM  Result Value Ref Range   aPTT 28 24 - 36 seconds  Protime-INR     Status: None   Collection Time: 11/09/16  6:15 AM  Result Value Ref Range   Prothrombin Time 12.9 11.4 - 15.2 seconds   INR 0.97   CBC     Status: None   Collection Time: 11/09/16  6:15 AM  Result Value Ref Range   WBC 4.9 4.0 - 10.5 K/uL   RBC  4.54 4.22 - 5.81 MIL/uL   Hemoglobin 13.5 13.0 - 17.0 g/dL   HCT 39.4 39.0 - 52.0 %   MCV 86.8 78.0 - 100.0 fL   MCH 29.7 26.0 - 34.0 pg   MCHC 34.3 30.0 - 36.0 g/dL   RDW 14.2 11.5 - 15.5 %   Platelets 165 150 - 400 K/uL  Basic metabolic panel     Status: Abnormal   Collection Time: 11/09/16  6:15 AM  Result Value Ref Range   Sodium 139 135 - 145 mmol/L   Potassium 3.3 (L) 3.5 - 5.1 mmol/L   Chloride 102 101 - 111 mmol/L   CO2 28 22 - 32 mmol/L   Glucose, Bld 120 (H) 65 - 99 mg/dL   BUN 16 6 - 20 mg/dL   Creatinine, Ser 1.13 0.61 - 1.24 mg/dL   Calcium 9.0 8.9 - 10.3 mg/dL   GFR calc non Af Amer >60 >60 mL/min   GFR calc Af Amer >60 >60 mL/min   Anion gap 9 5 - 15   No results found.  Assessment/Plan: Diagnosis: Revision left AKA Labs independently reviewed.  Records reviewed and summated above.  1. Does the need for close, 24 hr/day medical supervision in concert with the patient's rehab needs make it unreasonable for this patient to be served in a less intensive setting? No 2. 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 Due to safety, skin/wound care, pain management and patient education, does the patient require 24 hr/day rehab nursing? No 4. Does the patient require coordinated care of a physician, rehab nurse, PT (1-2 hrs/day, 5 days/week) and OT (1-2 hrs/day, 5 days/week) to address physical and functional deficits in the context of the above medical diagnosis(es)? No Addressing deficits in the following areas: balance, endurance, locomotion, strength,  transferring, bathing, dressing, toileting and psychosocial support 5. Can the  patient actively participate in an intensive therapy program of at least 3 hrs of therapy per day at least 5 days per week? Yes 6. The potential for patient to make measurable gains while on inpatient rehab is NAD 7. Anticipated functional outcomes upon discharge from inpatient rehab are n/a  with PT, n/a with OT, n/a with SLP. 8. Estimated rehab length of stay to reach the above functional goals is: NA 9. Anticipated D/C setting: Home 10. Anticipated post D/C treatments: Outpatient therapy 11. Overall Rehab/Functional Prognosis: excellent  RECOMMENDATIONS: This patient's condition is appropriate for continued rehabilitative care in the following setting: Pt doing well with therapies, may d/c home. [facility].  Cont to follow up. Patient has agreed to participate in recommended program. No Note that insurance prior authorization may be required for reimbursement for recommended care.  Delice Lesch, MD, [address], [place] 11/09/2016

## 2016-11-09 NOTE — Anesthesia Preprocedure Evaluation (Signed)
Anesthesia Evaluation  Patient identified by MRN, date of birth, ID band Patient awake    Reviewed: Allergy & Precautions, NPO status , Patient's Chart, lab work & pertinent test results  History of Anesthesia Complications (+) PONV  Airway Mallampati: I  TM Distance: >3 FB Neck ROM: Full    Dental   Pulmonary former smoker,    Pulmonary exam normal        Cardiovascular hypertension, Pt. on medications + CAD and + Past MI  Normal cardiovascular exam     Neuro/Psych    GI/Hepatic GERD  Medicated and Controlled,  Endo/Other    Renal/GU      Musculoskeletal   Abdominal   Peds  Hematology   Anesthesia Other Findings   Reproductive/Obstetrics                             Anesthesia Physical Anesthesia Plan  ASA: III  Anesthesia Plan: General   Post-op Pain Management:    Induction: Intravenous  PONV Risk Score and Plan: 3 and Scopolamine patch - Pre-op, Diphenhydramine, Propofol, Dexamethasone, Ondansetron and Midazolam  Airway Management Planned: Oral ETT  Additional Equipment:   Intra-op Plan:   Post-operative Plan: Extubation in OR  Informed Consent: I have reviewed the patients History and Physical, chart, labs and discussed the procedure including the risks, benefits and alternatives for the proposed anesthesia with the patient or authorized representative who has indicated his/her understanding and acceptance.     Plan Discussed with: CRNA and Surgeon  Anesthesia Plan Comments:         Anesthesia Quick Evaluation

## 2016-11-09 NOTE — Anesthesia Postprocedure Evaluation (Signed)
Anesthesia Post Note  Patient: Raymond Jacobson  Procedure(s) Performed: Procedure(s) (LRB): REVISION LEFT ABOVE KNEE AMPUTATION (Left)     Patient location during evaluation: PACU Anesthesia Type: General Level of consciousness: awake and alert Pain management: pain level controlled Vital Signs Assessment: post-procedure vital signs reviewed and stable Respiratory status: spontaneous breathing, nonlabored ventilation, respiratory function stable and patient connected to nasal cannula oxygen Cardiovascular status: blood pressure returned to baseline and stable Postop Assessment: no signs of nausea or vomiting Anesthetic complications: no    Last Vitals:  Vitals:   11/09/16 1145 11/09/16 1200  BP:    Pulse: 72 80  Resp: 12 12  Temp:      Last Pain:  Vitals:   11/09/16 1200  TempSrc:   PainSc: Irion DAVID

## 2016-11-09 NOTE — Interval H&P Note (Signed)
History and Physical Interval Note:  11/09/2016 7:32 AM  Raymond Jacobson  has presented today for surgery, with the diagnosis of Peripheral Vascular Disease I70.92 Pain left above knee stump  M79.609  The various methods of treatment have been discussed with the patient and family. After consideration of risks, benefits and other options for treatment, the patient has consented to  Procedure(s): REVISION LEFT AMPUTATION ABOVE KNEE (Left) as a surgical intervention .  The patient's history has been reviewed, patient examined, no change in status, stable for surgery.  I have reviewed the patient's chart and labs.  Questions were answered to the patient's satisfaction.     Ruta Hinds

## 2016-11-09 NOTE — Transfer of Care (Signed)
Immediate Anesthesia Transfer of Care Note  Patient: Raymond Jacobson  Procedure(s) Performed: Procedure(s): REVISION LEFT ABOVE KNEE AMPUTATION (Left)  Patient Location: PACU  Anesthesia Type:General  Level of Consciousness: awake, alert  and oriented  Airway & Oxygen Therapy: Patient Spontanous Breathing and Patient connected to nasal cannula oxygen  Post-op Assessment: Report given to RN and Post -op Vital signs reviewed and stable  Post vital signs: Reviewed and stable  Last Vitals:  Vitals:   11/09/16 0612  BP: (!) 166/74  Pulse: 63  Resp: 20  Temp: 36.6 C    Last Pain:  Vitals:   11/09/16 0612  TempSrc: Oral      Patients Stated Pain Goal: 4 (60/60/04 5997)  Complications: No apparent anesthesia complications

## 2016-11-09 NOTE — H&P (View-Only) (Signed)
Patient is a 76 year old male who returns for follow-up today. He underwent a left above-knee amputation August 2017. Since that time he has developed progressive pain on the anterior aspect of his left above-knee amputation. He experiences pain primarily when he flexes the left thigh. However he does have some pain in other positions as well. He is walking on a prosthetic and the pain does not really seem to be exacerbated with this. He is on Pradaxa.  Review of systems: He denies chest pain. He denies shortness of breath.  Past Medical History:  Diagnosis Date  . Angina    last time >yr  . Arthritis    "knees, ankles" (10/15/2015)  . Basal cell carcinoma of scalp    "froze off"  . CAD (coronary artery disease)    Previous stents last 6 years ago [place] CT  . Complication of anesthesia   . Cough    recent bronchitis- dry cough  . Dry cough   . DVT (deep venous thrombosis) ([facility]) ~ 1969   LLE  . Elevated lipids   . GERD (gastroesophageal reflux disease)    occ -tums  . High cholesterol   . History of hiatal hernia    hx  . Hypertension   . Myocardial infarction [facility]) ~ 2006   "blood work"  . Peripheral vascular disease ([place])   . PONV (postoperative nausea and vomiting)   . Shortness of breath 04/13/11   "mostly w/exertion"/ now with bronchitis    Past Surgical History:  Procedure Laterality Date  . AMPUTATION Left 01/07/2016   Procedure: AMPUTATION LEFT LEG  ABOVE KNEE;  Surgeon: Elam Dutch, MD;  Location: [place];  Service: Vascular;  Laterality: Left;  . CARPAL TUNNEL RELEASE Right 1990s  . CORONARY ANGIOPLASTY WITH STENT PLACEMENT  ~ 2004; ~ 2006; 04/13/11   "# of stents 1 (i2004);+2 (2006)+ 1 (04/13/11)= 4 total"  . ENDARTERECTOMY FEMORAL Left 01/01/2016   Procedure: LEFT FEMORAL ARTERY  ENDARTERECTOMY ;  Surgeon: Elam Dutch, MD;  Location: [place];  Service: Vascular;  Laterality: Left;  . FEMORAL ARTERY STENT Left 10/15/2015  . FEMORAL-POPLITEAL BYPASS GRAFT Left  01/01/2016   Procedure: Thrombectomy of Left Leg Femoral-Popliteal Bypass Graft;  Surgeon: Elam Dutch, MD;  Location: [place];  Service: Vascular;  Laterality: Left;  . FEMORAL-POPLITEAL BYPASS GRAFT Left 01/01/2016   Procedure: LEFT  FEMORAL-POPLITEAL ARTERY  BYPASS GRAFT ;  Surgeon: Elam Dutch, MD;  Location: [place];  Service: Vascular;  Laterality: Left;  . FEMORAL-POPLITEAL BYPASS GRAFT Left 01/06/2016   Procedure: THROMBECTOMY WITH REVISION oF dISTAL Anastomosis BYPASS GRAFT FEMORAL-POPLITEAL ARTERY.;  Surgeon: Rosetta Posner, MD;  Location: [place];  Service: Vascular;  Laterality: Left;  . FEMORAL-POPLITEAL BYPASS GRAFT Left 01/06/2016   Procedure: REDO FEMORAL to BELOW THE KNEE POPLITEAL ARTERY BYPASS USING 80mm PROPATEN GRAFT;  Surgeon: Elam Dutch, MD;  Location: [place];  Service: Vascular;  Laterality: Left;  . FINGER AMPUTATION  1967   "partial; right pointer"  . INTRAOPERATIVE ARTERIOGRAM Left 01/01/2016   Procedure: INTRA OPERATIVE ARTERIOGRAM;  Surgeon: Elam Dutch, MD;  Location: [place];  Service: Vascular;  Laterality: Left;  . LEFT HEART CATHETERIZATION WITH CORONARY ANGIOGRAM N/A 04/13/2011   Procedure: LEFT HEART CATHETERIZATION WITH CORONARY ANGIOGRAM;  Surgeon: Josue Hector, MD;  Location: [facility] CATH LAB;  Service: Cardiovascular;  Laterality: N/A;  . LEFT HEART CATHETERIZATION WITH CORONARY ANGIOGRAM N/A 06/20/2013   Procedure: LEFT HEART CATHETERIZATION WITH CORONARY ANGIOGRAM;  Surgeon: Leonie Man, MD;  Location: [facility] CATH LAB;  Service: Cardiovascular;  Laterality: N/A;  . PERCUTANEOUS CORONARY STENT INTERVENTION (PCI-S) Right 04/13/2011   Procedure: PERCUTANEOUS CORONARY STENT INTERVENTION (PCI-S);  Surgeon: Burnell Blanks, MD;  Location: [facility] CATH LAB;  Service: Cardiovascular;  Laterality: Right;  . PERIPHERAL VASCULAR CATHETERIZATION N/A 10/15/2015   Procedure: Abdominal Aortogram w/Lower Extremity;  Surgeon: Serafina Mitchell, MD;  Location: [place] CV LAB;   Service: Cardiovascular;  Laterality: N/A;  . TONSILLECTOMY     "when I was a young one"  . Tricep Surgery  ~ 2007   "right arm; tore all my muscles @ my elbow"  . VEIN HARVEST Left 01/01/2016   Procedure: USING NON-REVERSE LEFT GREATER SAPHENOUS VEIN HARVEST;  Surgeon: Elam Dutch, MD;  Location: [place];  Service: Vascular;  Laterality: Left;    Allergies  Allergen Reactions  . No Known Allergies     Current Outpatient Prescriptions on File Prior to Visit  Medication Sig Dispense Refill  . atorvastatin (LIPITOR) 40 MG tablet Take 1 tablet (40 mg total) by mouth every evening. 30 tablet 1  . dabigatran (PRADAXA) 150 MG CAPS capsule Take 1 capsule (150 mg total) by mouth 2 (two) times daily. 60 capsule 1  . iron polysaccharides (NIFEREX) 150 MG capsule Take 1 capsule (150 mg total) by mouth 2 times daily at 12 noon and 4 pm. 60 capsule 0  . methocarbamol (ROBAXIN) 500 MG tablet Take 1 tablet (500 mg total) by mouth every 6 (six) hours as needed for muscle spasms. (Patient taking differently: Take 500 mg by mouth 2 (two) times daily as needed for muscle spasms. ) 60 tablet 0  . metoprolol succinate (TOPROL-XL) 100 MG 24 hr tablet Take 1 tablet (100 mg total) by mouth daily. Take with or immediately following a meal. 30 tablet 1  . niacin (NIASPAN) 750 MG CR tablet Take 1 tablet (750 mg total) by mouth at bedtime. 30 tablet 0  . nitroGLYCERIN (NITROSTAT) 0.4 MG SL tablet Place 1 tablet (0.4 mg total) under the tongue every 5 (five) minutes as needed for chest pain. 25 tablet 12  . pantoprazole (PROTONIX) 40 MG tablet Take 1 tablet (40 mg total) by mouth daily. 30 tablet 0  . traMADol (ULTRAM) 50 MG tablet Take 1 tablet (50 mg total) by mouth every 6 (six) hours as needed for moderate pain. 60 tablet 0   Current Facility-Administered Medications on File Prior to Visit  Medication Dose Route Frequency Provider Last Rate Last Dose  . ciprofloxacin (CIPRO) tablet 500 mg  500 mg Oral BID  Nickel, Sharmon Leyden, NP        Physical exam:  Vitals:   10/29/16 1214  BP: 123/69  Pulse: 63  Resp: 16  Temp: 99 F (37.2 C)  SpO2: 99%  Weight: 200 lb (90.7 kg)  Height: 5\' 8"  (1.727 m)   Chest: Clear to auscultation bilaterally  Cardiac: Regular rate and rhythm  Extremities: Left above-knee amputation well-healed mildly tender to palpation over the distal femur in her data: I reviewed the patient's MRI scan which shows pseudo-bursa/fluid collection at the distal end of the above-knee amputation  Assessment: Pain left above-knee amputation is likely secondary to this fluid collection.  Plan: Revision of left above-knee amputation scheduled for 11/09/2016. We will stop his Pradaxa on 11/06/2016. Ruta Hinds, MD [facility] [place] Office: [phone] Pager: [phone]

## 2016-11-09 NOTE — Op Note (Signed)
Procedure: Revision left above-knee amputation  Preoperative diagnosis: Painful left above-knee amputation postoperative diagnosis: Same  Anesthesia: Gen.  Assistant: Leontine Locket PA-C  Operative findings: #1 suture bursa sac lateral distal portion of femur excised #2 femur shortened approximately 2 cm above the level of the pseudo-bursa  Operative details: After obtaining informed consent, the patient was doing the operating room. The patient was placed in supine position on the table. After induction of general anesthesia and endotracheal intubation, the patient's entire left lower extremity was prepped and draped in usual sterile fashion. Next a ultrasound probe was used to identify the area of the pseudo-bursa which was central adjacent to the femur. A transverse incision was made through a pre-existing scar at the distal aspect of the left above-knee amputation. Incision was carried down through the subcutaneoustissues down to level muscle fascia. All this was healthy-appearing tissue. Upon dissection down to the lateral aspect of the femur there was clear fluid cystic cavity entered. The entire cyst was excised with the cautery. I then used a periosteal elevator to raise the periosteum on the femur about 2 cm from its distal edge so that we were above the area of the previous pseudo-bursa. Saw was then used to transect the bone at this level. The wound was thoroughly irrigated with normal saline solution. A rasp was used to smooth the bone. The deep layers of the fascia muscle was then closed with 2 layers of interrupted Vicryl suture. Cutaneous tissues were closed with a running 3-0 Vicryl suture. The skin was closed with a 4-0 Vicryl subcuticular stitch. The patient tolerated the procedure well and there were no complications. Instrument sponge and needle counts correct in the case. The patient was taken to the recovery room in stable condition.  Ruta Hinds, MD [facility] [place] Office: [phone] Pager: [phone]

## 2016-11-09 NOTE — Progress Notes (Signed)
PT Cancellation Note  Patient Details Name: Raymond Jacobson MRN: [id number] DOB: 06-04-1940   Cancelled Treatment:    Reason Eval/Treat Not Completed: Other (comment). Pt with surgery today. Will follow up tomorrow.   [place] 11/09/2016, 3:10 PM [place]

## 2016-11-09 NOTE — Anesthesia Procedure Notes (Signed)
Procedure Name: Intubation Date/Time: 11/09/2016 7:52 AM Performed by: Ollen Bowl Pre-anesthesia Checklist: Patient identified, Emergency Drugs available, Suction available, Patient being monitored and Timeout performed Patient Re-evaluated:Patient Re-evaluated prior to inductionOxygen Delivery Method: Circle system utilized and Simple face mask Preoxygenation: Pre-oxygenation with 100% oxygen Intubation Type: IV induction Ventilation: Mask ventilation without difficulty Laryngoscope Size: Miller and 3 Grade View: Grade I Tube type: Oral Tube size: 7.5 mm Number of attempts: 1 Airway Equipment and Method: Patient positioned with wedge pillow and Stylet Placement Confirmation: ETT inserted through vocal cords under direct vision,  positive ETCO2 and breath sounds checked- equal and bilateral Secured at: 22 cm Tube secured with: Tape Dental Injury: Teeth and Oropharynx as per pre-operative assessment

## 2016-11-10 ENCOUNTER — Encounter (HOSPITAL_COMMUNITY): Payer: Self-pay | Admitting: Vascular Surgery

## 2016-11-10 ENCOUNTER — Inpatient Hospital Stay (HOSPITAL_COMMUNITY): Payer: Medicare Other | Admitting: Physical Therapy

## 2016-11-10 ENCOUNTER — Inpatient Hospital Stay (HOSPITAL_COMMUNITY): Payer: Medicare Other | Admitting: Occupational Therapy

## 2016-11-10 DIAGNOSIS — Z89612 Acquired absence of left leg above knee: Secondary | ICD-10-CM

## 2016-11-10 DIAGNOSIS — I2510 Atherosclerotic heart disease of native coronary artery without angina pectoris: Secondary | ICD-10-CM

## 2016-11-10 DIAGNOSIS — M1712 Unilateral primary osteoarthritis, left knee: Secondary | ICD-10-CM

## 2016-11-10 DIAGNOSIS — G8918 Other acute postprocedural pain: Secondary | ICD-10-CM

## 2016-11-10 DIAGNOSIS — Z86718 Personal history of other venous thrombosis and embolism: Secondary | ICD-10-CM

## 2016-11-10 DIAGNOSIS — I739 Peripheral vascular disease, unspecified: Secondary | ICD-10-CM

## 2016-11-10 DIAGNOSIS — S78112A Complete traumatic amputation at level between left hip and knee, initial encounter: Secondary | ICD-10-CM

## 2016-11-10 DIAGNOSIS — D62 Acute posthemorrhagic anemia: Secondary | ICD-10-CM

## 2016-11-10 DIAGNOSIS — T8789 Other complications of amputation stump: Principal | ICD-10-CM

## 2016-11-10 LAB — CBC
HEMATOCRIT: 36.1 % — AB (ref 39.0–52.0)
HEMOGLOBIN: 12.5 g/dL — AB (ref 13.0–17.0)
MCH: 29.9 pg (ref 26.0–34.0)
MCHC: 34.6 g/dL (ref 30.0–36.0)
MCV: 86.4 fL (ref 78.0–100.0)
Platelets: 189 10*3/uL (ref 150–400)
RBC: 4.18 MIL/uL — ABNORMAL LOW (ref 4.22–5.81)
RDW: 14 % (ref 11.5–15.5)
WBC: 8.6 10*3/uL (ref 4.0–10.5)

## 2016-11-10 LAB — BASIC METABOLIC PANEL
ANION GAP: 11 (ref 5–15)
BUN: 18 mg/dL (ref 6–20)
CHLORIDE: 98 mmol/L — AB (ref 101–111)
CO2: 26 mmol/L (ref 22–32)
Calcium: 8.6 mg/dL — ABNORMAL LOW (ref 8.9–10.3)
Creatinine, Ser: 1.11 mg/dL (ref 0.61–1.24)
GFR calc non Af Amer: >60 mL/min (ref >60)
Glucose, Bld: 165 mg/dL — ABNORMAL HIGH (ref 65–99)
POTASSIUM: 3.9 mmol/L (ref 3.5–5.1)
Sodium: 135 mmol/L (ref 135–145)

## 2016-11-10 MED ORDER — TRAMADOL HCL 50 MG PO TABS: 50.0000 mg | ORAL_TABLET | Freq: Four times a day (QID) | ORAL | 0 refills | Status: DC | PRN

## 2016-11-10 NOTE — Progress Notes (Addendum)
  Progress Note    11/10/2016 7:13 AM 1 Day Post-Op  Subjective:  "I feel great! I haven't had any pain meds"  Afebrile HR  60's-80's NSR 675'F-163'W systolic 466% 5LD3TT  Vitals:   11/09/16 2040 11/10/16 0446  BP: 129/68 (!) 157/71  Pulse: 85 64  Resp: 18   Temp: 98.4 F (36.9 C) 97.7 F (36.5 C)    Physical Exam: Incisions:  Clean and dry-no drainage.  Extremities:  Good ROM  CBC    Component Value Date/Time   WBC 8.6 11/10/2016 0219   RBC 4.18 (L) 11/10/2016 0219   HGB 12.5 (L) 11/10/2016 0219   HCT 36.1 (L) 11/10/2016 0219   PLT 189 11/10/2016 0219   MCV 86.4 11/10/2016 0219   MCH 29.9 11/10/2016 0219   MCHC 34.6 11/10/2016 0219   RDW 14.0 11/10/2016 0219   LYMPHSABS 0.9 01/31/2016 1710   MONOABS 0.5 01/31/2016 1710   EOSABS 0.4 01/31/2016 1710   BASOSABS 0.0 01/31/2016 1710    BMET    Component Value Date/Time   NA 135 11/10/2016 0219   K 3.9 11/10/2016 0219   CL 98 (L) 11/10/2016 0219   CO2 26 11/10/2016 0219   GLUCOSE 165 (H) 11/10/2016 0219   BUN 18 11/10/2016 0219   CREATININE 1.11 11/10/2016 0219   CALCIUM 8.6 (L) 11/10/2016 0219   GFRNONAA >60 11/10/2016 0219   GFRAA >60 11/10/2016 0219    INR    Component Value Date/Time   INR 0.97 11/09/2016 0615     Intake/Output Summary (Last 24 hours) at 11/10/16 0713 Last data filed at 11/09/16 2002  Gross per 24 hour  Intake              700 ml  Output              400 ml  Net              300 ml     Assessment/Plan:  76 y.o. male is s/p Revision left above-knee amputation  1 Day Post-Op  -pt doing well and pain is controlled -will discharge home today -f/u with Dr. Oneida Alar in 2-3 weeks.   Leontine Locket, PA-C [facility] [phone] 11/10/2016 7:13 AM  Agree with above. Pain well controlled D/c home  Ruta Hinds, MD [facility] [place] Office: [phone] Pager: [phone]

## 2016-11-10 NOTE — Care Management Note (Signed)
Case Management Note Marvetta Gibbons RN, BSN Unit 2W-Case Manager [phone]  Patient Details  Name: Raymond Jacobson MRN: [id number] Date of Birth: 1941/02/01  Subjective/Objective:   Pt admitted s/p revision of left above-knee amputation                 Action/Plan: PTA pt lived at home- plan to d/c home- no CM needs noted for discharge.   Expected Discharge Date:  11/10/16               Expected Discharge Plan:  Home/Self Care  In-House Referral:     Discharge planning Services  CM Consult  Post Acute Care Choice:  NA Choice offered to:  NA  DME Arranged:    DME Agency:     HH Arranged:    HH Agency:     Status of Service:  Completed, signed off  If discussed at [facility] of Stay Meetings, dates discussed:    Discharge Disposition: home/self care   Additional Comments:  Dawayne Patricia, RN 11/10/2016, 9:29 AM

## 2016-11-10 NOTE — Evaluation (Signed)
Physical Therapy Evaluation Patient Details Name: Raymond Jacobson MRN: [id number] DOB: Dec 26, 1940 Today's Date: 11/10/2016   History of Present Illness  76 y.o. male admitted to [facility] on 11/09/16 for L AKA revision. Pt with significant PMHx of prior Left leg revasularization with AKA Aug 2017, SOB, PVD, HTN, MI, DVT, and CAD.   Clinical Impression  Pt very pleasant and eager to return home. Pt highly mobile with good standing balance on single limb with assist of friend at D/C for one week. Pt with decreased activity tolerance due to soreness in operative limb and will benefit from acute therapy to maximize mobility and independence prior to D/C.     Follow Up Recommendations No PT follow up    Equipment Recommendations  None recommended by PT    Recommendations for Other Services       Precautions / Restrictions Restrictions Weight Bearing Restrictions: Yes LLE Weight Bearing: Non weight bearing      Mobility  Bed Mobility Overal bed mobility: Modified Independent             General bed mobility comments: Pt supine to sit with HOB raised ~30 degrees. Used handrail to pull up.   Transfers Overall transfer level: Modified independent Equipment used: None                Ambulation/Gait Ambulation/Gait assistance: Supervision Ambulation Distance (Feet): 110 Feet Assistive device: Rolling walker (2 wheeled) Gait Pattern/deviations: Decreased step length - right;Trunk flexed;Step-to pattern   Gait velocity interpretation: at or above normal speed for age/gender General Gait Details: cues for self regulation, good balance and stability  Stairs            Wheelchair Mobility    Modified Rankin (Stroke Patients Only)       Balance Overall balance assessment: Modified Independent                                           Pertinent Vitals/Pain Pain Assessment: 0-10 Pain Location: Pt noted pain on distal portion of L AKA to palpation.  Unrated. Minimal pain during ambulation.  Pain Intervention(s): Monitored during session    Home Living Family/patient expects to be discharged to:: Private residence Living Arrangements: Alone Available Help at Discharge: Friend(s);Available 24 hours/day Type of Home: House Home Access: Ramped entrance     Home Layout: One level Home Equipment: Walker - 2 wheels;Wheelchair - Press photographer;Tub bench Additional Comments: will be staying with friend after d/c.  Only has threshold at entrance.    Prior Function Level of Independence: Independent with assistive device(s)         Comments: archery     Hand Dominance   Dominant Hand: Right    Extremity/Trunk Assessment   Upper Extremity Assessment Upper Extremity Assessment: Overall WFL for tasks assessed    Lower Extremity Assessment Lower Extremity Assessment: Overall WFL for tasks assessed    Cervical / Trunk Assessment Cervical / Trunk Assessment: Kyphotic  Communication   Communication: No difficulties  Cognition Arousal/Alertness: Awake/alert Behavior During Therapy: WFL for tasks assessed/performed Overall Cognitive Status: Within Functional Limits for tasks assessed                                        General Comments General comments (skin integrity, edema, etc.): Pt presents  with good single-leg balance using RW.     Exercises     Assessment/Plan    PT Assessment Patient needs continued PT services  PT Problem List Decreased activity tolerance;Decreased balance;Decreased mobility       PT Treatment Interventions Gait training;Therapeutic exercise;Functional mobility training;Patient/family education;Therapeutic activities    PT Goals (Current goals can be found in the Care Plan section)  Acute Rehab PT Goals Patient Stated Goal: return to archery PT Goal Formulation: With patient Time For Goal Achievement: 11/17/16 Potential to Achieve Goals: Good    Frequency Min  3X/week   Barriers to discharge        Co-evaluation               AM-PAC PT "6 Clicks" Daily Activity  Outcome Measure Difficulty turning over in bed (including adjusting bedclothes, sheets and blankets)?: A Little Difficulty moving from lying on back to sitting on the side of the bed? : Total Difficulty sitting down on and standing up from a chair with arms (e.g., wheelchair, bedside commode, etc,.)?: None Help needed moving to and from a bed to chair (including a wheelchair)?: A Little Help needed walking in hospital room?: A Little Help needed climbing 3-5 steps with a railing? : Total 6 Click Score: 15    End of Session Equipment Utilized During Treatment: Gait belt Activity Tolerance: Patient tolerated treatment well Patient left: in chair;with call bell/phone within reach;with family/visitor present;with chair alarm set Nurse Communication: Mobility status PT Visit Diagnosis: Other abnormalities of gait and mobility (R26.89)    Time: 2706-2376 PT Time Calculation (min) (ACUTE ONLY): 29 min   Charges:   PT Evaluation $PT Eval Moderate Complexity: 1 Procedure     PT G Codes:          Raymond Jacobson B Raymond Jacobson 11/10/2016, 10:09 AM  [phone]

## 2016-11-10 NOTE — Progress Notes (Signed)
OT Cancellation Note  Patient Details Name: Baron Parmelee MRN: [id number] DOB: 1941/01/16   Cancelled Treatment:    Reason Eval/Treat Not Completed: OT screened, no needs identified, will sign off  Malka So 11/10/2016, 10:32 AM

## 2016-11-11 ENCOUNTER — Inpatient Hospital Stay (HOSPITAL_COMMUNITY): Payer: Medicare Other | Admitting: Occupational Therapy

## 2016-11-11 ENCOUNTER — Telehealth: Payer: Self-pay | Admitting: Vascular Surgery

## 2016-11-11 ENCOUNTER — Inpatient Hospital Stay (HOSPITAL_COMMUNITY): Payer: Medicare Other | Admitting: Physical Therapy

## 2016-11-11 NOTE — Telephone Encounter (Signed)
Sched appt 12/03/16 at 3:15. Spoke to pt to confirm appt.

## 2016-11-11 NOTE — Telephone Encounter (Signed)
-----   Message from Denman George, RN sent at 11/10/2016 12:08 PM EDT ----- Regarding: appt. with [facility] in 2-3 weeks/ s/p Revision (L) AKA   ----- Message ----- From: Gabriel Earing, PA-C Sent: 11/10/2016   7:27 AM To: Vvs Charge Pool  S/p revision of left AKA.  F/u with Dr. Oneida Alar in 2-3 weeks.  Thanks [employer]

## 2016-11-12 NOTE — Discharge Summary (Signed)
Discharge Summary    Raymond Jacobson [birth year] 75 y.o. male  [id number]  Admission Date: 11/09/2016  Discharge Date: 11/10/16  Physician: Dr. Ruta Hinds  Admission Diagnosis: pain left above knee amputation stump   HPI:   This is a 76 y.o. male who returns for follow-up today. He underwent a left above-knee amputation August 2017. Since that time he has developed progressive pain on the anterior aspect of his left above-knee amputation. He experiences pain primarily when he flexes the left thigh. However he does have some pain in other positions as well. He is walking on a prosthetic and the pain does not really seem to be exacerbated with this. He is on Pradaxa.  Review of systems: He denies chest pain. He denies shortness of breath  Hospital Course:  The patient was admitted to the hospital and taken to the operating room on 11/09/2016 and underwent: Revision left above-knee amputation    Operative findings: #1 suture bursa sac lateral distal portion of femur excised #2 femur shortened approximately 2 cm above the level of the pseudo-bursa.  The pt tolerated the procedure well and was transported to the PACU in good condition.   By POD 1, the pt's pain was well controlled and he was discharged home.  The remainder of the hospital course consisted of increasing mobilization and increasing intake of solids without difficulty.  CBC    Component Value Date/Time   WBC 8.6 11/10/2016 0219   RBC 4.18 (L) 11/10/2016 0219   HGB 12.5 (L) 11/10/2016 0219   HCT 36.1 (L) 11/10/2016 0219   PLT 189 11/10/2016 0219   MCV 86.4 11/10/2016 0219   MCH 29.9 11/10/2016 0219   MCHC 34.6 11/10/2016 0219   RDW 14.0 11/10/2016 0219   LYMPHSABS 0.9 01/31/2016 1710   MONOABS 0.5 01/31/2016 1710   EOSABS 0.4 01/31/2016 1710   BASOSABS 0.0 01/31/2016 1710    BMET    Component Value Date/Time   NA 135 11/10/2016 0219   K 3.9 11/10/2016 0219   CL 98 (L) 11/10/2016 0219   CO2 26  11/10/2016 0219   GLUCOSE 165 (H) 11/10/2016 0219   BUN 18 11/10/2016 0219   CREATININE 1.11 11/10/2016 0219   CALCIUM 8.6 (L) 11/10/2016 0219   GFRNONAA >60 11/10/2016 0219   GFRAA >60 11/10/2016 0219      Discharge Instructions    Call MD for:  redness, tenderness, or signs of infection (pain, swelling, bleeding, redness, odor or green/yellow discharge around incision site)    Complete by:  As directed    Call MD for:  severe or increased pain, loss or decreased feeling  in affected limb(s)    Complete by:  As directed    Call MD for:  temperature >100.5    Complete by:  As directed    Discharge wound care:    Complete by:  As directed    Shower daily with soap and water starting 11/11/16   Driving Restrictions    Complete by:  As directed    No driving for 2 weeks & while taking pain medication   Lifting restrictions    Complete by:  As directed    No heavy lifting for 2 weeks   Resume previous diet    Complete by:  As directed       Discharge Diagnosis:  pain left above knee amputation stump  Secondary Diagnosis: Patient Active Problem List   Diagnosis Date Noted  . Unilateral AKA, left ([place])   .  History of DVT (deep vein thrombosis)   . Primary osteoarthritis of left knee   . PVD (peripheral vascular disease) ([place])   . Pain of amputation stump of left lower extremity ([place]) 11/09/2016  . Prediabetes   . Benign essential HTN   . Status post above knee amputation of left lower extremity ([place]) 01/17/2016  . Abdominal distension   . Unilateral AKA ([place])   . Coronary artery disease involving native coronary artery of native heart without angina pectoris   . 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 HLD (hyperlipidemia)   . Post-operative pain   . Acute blood loss anemia   . Right lower lobe pneumonia ([place])   . Diastolic dysfunction   . Tachypnea   . Tachycardia   . Hypokalemia   . Leukocytosis   . Thrombocytopenia ([place])   . Surgery,  other elective   . Pain of upper abdomen   . Pneumothorax on right   . Chest trauma   . Acute respiratory failure ([place])   . Pre-operative cardiovascular examination 12/31/2015  . PAD (peripheral artery disease) ([place]) 10/15/2015  . ACS (acute coronary syndrome) ([place]) 06/20/2013  . HTN (hypertension) 04/13/2011  . Elevated lipids 04/13/2011  . CAD S/P multiple PCIs 04/13/2011   Past Medical History:  Diagnosis Date  . Angina    last time >yr  . Arthritis    "knees, ankles" (10/15/2015)  . Basal cell carcinoma of scalp    "froze off"  . CAD (coronary artery disease)    Previous stents last 6 years ago [facility] CT  . Complication of anesthesia   . Cough    recent bronchitis- dry cough  . Dry cough   . DVT (deep venous thrombosis) ([place]) ~ 1969 12/2015   LLE..   . Elevated lipids   . GERD (gastroesophageal reflux disease)    occ -tums  . High cholesterol   . History of hiatal hernia    hx  . Hypertension   . Myocardial infarction [facility]) ~ 2006   "blood work"  . Peripheral vascular disease ([place])   . Pulmonary emboli ([place]) 12/2015   after amputation  . Shortness of breath 04/13/11   "mostly w/exertion"/ now with bronchitis     Allergies as of 11/10/2016      Reactions   No Known Allergies       Medication List    TAKE these medications   atorvastatin 80 MG tablet Commonly known as:  LIPITOR Take 80 mg by mouth daily.   dabigatran 150 MG Caps capsule Commonly known as:  PRADAXA Take 1 capsule (150 mg total) by mouth 2 (two) times daily.   iron polysaccharides 150 MG capsule Commonly known as:  NIFEREX Take 1 capsule (150 mg total) by mouth 2 times daily at 12 noon and 4 pm.   metoprolol succinate 100 MG 24 hr tablet Commonly known as:  TOPROL-XL Take 1 tablet (100 mg total) by mouth daily. Take with or immediately following a meal.   niacin 750 MG CR tablet Commonly known as:  NIASPAN Take 1 tablet (750 mg total) by mouth at bedtime.   nitroGLYCERIN 0.4 MG  SL tablet Commonly known as:  NITROSTAT Place 1 tablet (0.4 mg total) under the tongue every 5 (five) minutes as needed for chest pain.   pantoprazole 40 MG tablet Commonly known as:  PROTONIX Take 1 tablet (40 mg total) by mouth daily.   traMADol 50 MG tablet Commonly known as:  ULTRAM Take 1 tablet (50 mg total) by mouth every 6 (six) hours as needed for moderate pain.       Prescriptions given: 1.  Tramadol #15 No Refill  Instructions: 1.  No driving for 2 weeks and while taking pain medication 2.  No heavy lifting x 2 weeks 3.  Shower daily starting 11/11/16  Disposition: home  Patient's condition: is Good  Follow up: 1. Dr. Oneida Alar in 2-3 weeks   Leontine Locket, PA-C [facility] [phone] 11/12/2016  8:34 AM

## 2016-11-19 ENCOUNTER — Encounter: Payer: Self-pay | Admitting: Vascular Surgery

## 2016-12-03 ENCOUNTER — Ambulatory Visit (INDEPENDENT_AMBULATORY_CARE_PROVIDER_SITE_OTHER): Payer: Self-pay | Admitting: Vascular Surgery

## 2016-12-03 ENCOUNTER — Encounter: Payer: Self-pay | Admitting: Vascular Surgery

## 2016-12-03 VITALS — BP 113/67 | HR 68 | Temp 98.2°F | Resp 20 | Ht 68.0 in | Wt 190.0 lb

## 2016-12-03 DIAGNOSIS — I739 Peripheral vascular disease, unspecified: Secondary | ICD-10-CM

## 2016-12-03 NOTE — Progress Notes (Signed)
Patient is a 76 year old male who returns today for postoperative follow-up after drainage of a pseudo-bursa of his left above-knee amputation. He reports that the pain that he was experiencing preoperatively has now completely resolved. He has had no incisional drainage or erythema. He is ready to get back to trying to get fitted for an above-knee prosthesis.  Physical exam:  Vitals:   12/03/16 1503  BP: 113/67  Pulse: 68  Resp: 20  Temp: 98.2 F (36.8 C)  TempSrc: Oral  SpO2: 99%  Weight: 190 lb (86.2 kg)  Height: 5\' 8"  (1.727 m)    Left above-knee amputation incision completely healed nontender to deep palpation   Assessment: Doing well status post revision left above-knee amputation.  Plan: Patient will follow-up in 6 months for a right leg ABI.  Ruta Hinds, MD [facility] [place] Office: [phone] Pager: [phone]

## 2016-12-14 NOTE — Addendum Note (Signed)
Addended by: Lianne Cure A on: 12/14/2016 04:00 PM   Modules accepted: Orders

## 2017-06-17 ENCOUNTER — Ambulatory Visit: Payer: No Typology Code available for payment source | Admitting: Vascular Surgery

## 2017-06-17 ENCOUNTER — Encounter (HOSPITAL_COMMUNITY): Payer: No Typology Code available for payment source

## 2017-06-24 ENCOUNTER — Ambulatory Visit: Payer: No Typology Code available for payment source | Admitting: Vascular Surgery

## 2017-06-24 ENCOUNTER — Encounter (HOSPITAL_COMMUNITY): Payer: No Typology Code available for payment source

## 2018-01-12 ENCOUNTER — Other Ambulatory Visit: Payer: Self-pay

## 2018-01-12 DIAGNOSIS — I739 Peripheral vascular disease, unspecified: Secondary | ICD-10-CM

## 2018-02-17 ENCOUNTER — Encounter (INDEPENDENT_AMBULATORY_CARE_PROVIDER_SITE_OTHER): Payer: Self-pay

## 2018-02-17 ENCOUNTER — Other Ambulatory Visit: Payer: Self-pay

## 2018-02-17 ENCOUNTER — Ambulatory Visit (HOSPITAL_COMMUNITY)
Admission: RE | Admit: 2018-02-17 | Discharge: 2018-02-17 | Disposition: A | Discharge status: Home / Self Care | Payer: Non-veteran care | Source: Ambulatory Visit | Attending: Vascular Surgery | Admitting: Vascular Surgery

## 2018-02-17 ENCOUNTER — Encounter: Payer: Self-pay | Admitting: Vascular Surgery

## 2018-02-17 ENCOUNTER — Ambulatory Visit (INDEPENDENT_AMBULATORY_CARE_PROVIDER_SITE_OTHER): Payer: Non-veteran care | Admitting: Vascular Surgery

## 2018-02-17 VITALS — BP 138/75 | HR 70 | Temp 97.8°F | Resp 20 | Ht 68.0 in | Wt 195.0 lb

## 2018-02-17 DIAGNOSIS — I739 Peripheral vascular disease, unspecified: Secondary | ICD-10-CM

## 2018-02-17 DIAGNOSIS — Z89612 Acquired absence of left leg above knee: Secondary | ICD-10-CM | POA: Insufficient documentation

## 2018-02-17 NOTE — Progress Notes (Signed)
Patient is a 77 year old male who returns for follow-up today.  He was last seen April 2018.  He has known peripheral arterial disease.  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  He is walking on his prosthetic some.  He experiences pain when walking on his walker after about 5 minutes.  This is a pain that occurs in his calf after about 5 minutes of exercise and this goes away after about 5 to 10 minutes of rest.  He denies rest pain.  He has no nonhealing wounds.  He has had no further problems with his left above-knee amputation.  Chronic medical problems include coronary artery disease pretension both of which are currently stable.  He is on Pradaxa and a statin.  Review of systems: He denies chest pain.  He denies shortness of breath.  Past Medical History:  Diagnosis Date  . Angina    last time >yr  . Arthritis    "knees, ankles" (10/15/2015)  . Basal cell carcinoma of scalp    "froze off"  . CAD (coronary artery disease)    Previous stents last 6 years ago [place] [facility]  . Complication of anesthesia   . Cough    recent bronchitis- dry cough  . Dry cough   . DVT (deep venous thrombosis) ([place]) ~ 1969 12/2015   LLE..   . Elevated lipids   . GERD (gastroesophageal reflux disease)    occ -tums  . High cholesterol   . History of hiatal hernia    hx  . Hypertension   . Myocardial infarction [facility]) ~ 2006   "blood work"  . Peripheral vascular disease ([place])   . Pulmonary emboli ([place]) 12/2015   after amputation  . Shortness of breath 04/13/11   "mostly w/exertion"/ now with bronchitis    Past Surgical History:  Procedure Laterality Date  . AMPUTATION Left 01/07/2016   Procedure: AMPUTATION LEFT LEG  ABOVE KNEE;  Surgeon: Elam Dutch, MD;  Location: [place];  Service: Vascular;  Laterality: Left;  . AMPUTATION Left 11/09/2016   Procedure: REVISION LEFT ABOVE KNEE AMPUTATION;  Surgeon: Elam Dutch, MD;  Location: [place];   Service: Vascular;  Laterality: Left;  . CARPAL TUNNEL RELEASE Right 1990s  . COLONOSCOPY W/ POLYPECTOMY    . CORONARY ANGIOPLASTY WITH STENT PLACEMENT  ~ 2004; ~ 2006; 04/13/11   "# of stents 1 (i2004);+2 (2006)+ 1 (04/13/11)= 4 total"  . ENDARTERECTOMY FEMORAL Left 01/01/2016   Procedure: LEFT FEMORAL ARTERY  ENDARTERECTOMY ;  Surgeon: Elam Dutch, MD;  Location: [place];  Service: Vascular;  Laterality: Left;  . FEMORAL ARTERY STENT Left 10/15/2015  . FEMORAL-POPLITEAL BYPASS GRAFT Left 01/01/2016   Procedure: Thrombectomy of Left Leg Femoral-Popliteal Bypass Graft;  Surgeon: Elam Dutch, MD;  Location: [place];  Service: Vascular;  Laterality: Left;  . FEMORAL-POPLITEAL BYPASS GRAFT Left 01/01/2016   Procedure: LEFT  FEMORAL-POPLITEAL ARTERY  BYPASS GRAFT ;  Surgeon: Elam Dutch, MD;  Location: [place];  Service: Vascular;  Laterality: Left;  . FEMORAL-POPLITEAL BYPASS GRAFT Left 01/06/2016   Procedure: THROMBECTOMY WITH REVISION oF dISTAL Anastomosis BYPASS GRAFT FEMORAL-POPLITEAL ARTERY.;  Surgeon: Rosetta Posner, MD;  Location: [place];  Service: Vascular;  Laterality: Left;  . FEMORAL-POPLITEAL BYPASS GRAFT Left 01/06/2016   Procedure: REDO FEMORAL to BELOW THE KNEE POPLITEAL ARTERY BYPASS USING 88mm PROPATEN GRAFT;  Surgeon: Elam Dutch, MD;  Location: [place];  Service: Vascular;  Laterality: Left;  . FINGER AMPUTATION Right 1967   "partial; right pointer"  . INTRAOPERATIVE ARTERIOGRAM Left 01/01/2016   Procedure: INTRA OPERATIVE ARTERIOGRAM;  Surgeon: Elam Dutch, MD;  Location: [place];  Service: Vascular;  Laterality: Left;  . LEFT HEART CATHETERIZATION WITH CORONARY ANGIOGRAM N/A 04/13/2011   Procedure: LEFT HEART CATHETERIZATION WITH CORONARY ANGIOGRAM;  Surgeon: Josue Hector, MD;  Location: [facility] C[facility] LAB;  Service: Cardiovascular;  Laterality: N/A;  . LEFT HEART CATHETERIZATION WITH CORONARY ANGIOGRAM N/A 06/20/2013   Procedure: LEFT HEART CATHETERIZATION WITH CORONARY ANGIOGRAM;   Surgeon: Leonie Man, MD;  Location: [facility] CATH LAB;  Service: Cardiovascular;  Laterality: N/A;  . PERCUTANEOUS CORONARY STENT INTERVENTION (PCI-S) Right 04/13/2011   Procedure: PERCUTANEOUS CORONARY STENT INTERVENTION (PCI-S);  Surgeon: Burnell Blanks, MD;  Location: [facility] CATH LAB;  Service: Cardiovascular;  Laterality: Right;  . PERIPHERAL VASCULAR CATHETERIZATION N/A 10/15/2015   Procedure: Abdominal Aortogram w/Lower Extremity;  Surgeon: Serafina Mitchell, MD;  Location: [place] CV LAB;  Service: Cardiovascular;  Laterality: N/A;  . TONSILLECTOMY     "when I was a young one"  . Tricep Surgery  ~ 2007   "right arm; tore all my muscles @ my elbow"  . VEIN HARVEST Left 01/01/2016   Procedure: USING NON-REVERSE LEFT GREATER SAPHENOUS VEIN HARVEST;  Surgeon: Elam Dutch, MD;  Location: [place];  Service: Vascular;  Laterality: Left;   Current Outpatient Medications on File Prior to Visit  Medication Sig Dispense Refill  . allopurinol (ZYLOPRIM) 100 MG tablet Take 100 mg by mouth daily.    Marland Kitchen atorvastatin (LIPITOR) 80 MG tablet Take 80 mg by mouth daily.    . dabigatran (PRADAXA) 150 MG CAPS capsule Take 1 capsule (150 mg total) by mouth 2 (two) times daily. 60 capsule 1  . iron polysaccharides (NIFEREX) 150 MG capsule Take 1 capsule (150 mg total) by mouth 2 times daily at 12 noon and 4 pm. 60 capsule 0  . metoprolol succinate (TOPROL-XL) 100 MG 24 hr tablet Take 1 tablet (100 mg total) by mouth daily. Take with or immediately following a meal. 30 tablet 1  . niacin (NIASPAN) 750 MG CR tablet Take 1 tablet (750 mg total) by mouth at bedtime. 30 tablet 0  . nitroGLYCERIN (NITROSTAT) 0.4 MG SL tablet Place 1 tablet (0.4 mg total) under the tongue every 5 (five) minutes as needed for chest pain. 25 tablet 12  . pantoprazole (PROTONIX) 40 MG tablet Take 1 tablet (40 mg total) by mouth daily. 30 tablet 0  . tamsulosin (FLOMAX) 0.4 MG CAPS capsule Take 0.4 mg by mouth.    . traMADol (ULTRAM)  50 MG tablet Take 1 tablet (50 mg total) by mouth every 6 (six) hours as needed for moderate pain. 15 tablet 0   No current facility-administered medications on file prior to visit.    Social History   Socioeconomic History  . Marital status: Divorced    Spouse name: Not on file  . Number of children: Not on file  . Years of education: Not on file  . Highest education level: Not on file  Occupational History  . Occupation: Retired  Scientific laboratory technician  . Financial resource strain: Not on file  . Food insecurity:    Worry: Not on file    Inability: Not on file  . Transportation needs:    Medical: Not on file    Non-medical: Not on file  Tobacco Use  . Smoking status: Former Smoker  Packs/day: 1.00    Years: 12.00    Pack years: 12.00    Types: Cigarettes    Last attempt to quit: 12/24/1967    Years since quitting: 50.1  . Smokeless tobacco: Never Used  . Tobacco comment: "quit smoking cigarettes 1969"  Substance and Sexual Activity  . Alcohol use: No    Alcohol/week: 0.0 standard drinks    Comment: 10/15/2015 last beer was summer 2012; prior to that ~2008""  . Drug use: No  . Sexual activity: Never  Lifestyle  . Physical activity:    Days per week: Not on file    Minutes per session: Not on file  . Stress: Not on file  Relationships  . Social connections:    Talks on phone: Not on file    Gets together: Not on file    Attends religious service: Not on file    Active member of club or organization: Not on file    Attends meetings of clubs or organizations: Not on file    Relationship status: Not on file  . Intimate partner violence:    Fear of current or ex partner: Not on file    Emotionally abused: Not on file    Physically abused: Not on file    Forced sexual activity: Not on file  Other Topics Concern  . Not on file  Social History Narrative   Married   [place]   Retired from [employer] smoking in 96   Sedentary     Physical  exam:  Vitals:   02/17/18 1351  BP: 138/75  Pulse: 70  Resp: 20  Temp: 97.8 F (36.6 C)  TempSrc: Oral  SpO2: 100%  Weight: 195 lb (88.5 kg)  Height: 5\' 8"  (1.727 m)    Extremities: 2+ femoral pulses absent pedal pulses right foot  Skin: No ulceration  Data: Patient had right leg ABI performed today which was 0.6.  This is compared to 0.8 approximately 1 year ago.  Assessment: Patient with known peripheral arterial disease.  He has short distance claudication in his right leg at this point which limits his ambulation with his prosthetic.  I discussed with the patient that currently he is not at risk of limb loss.  However, this is fairly lifestyle limiting for him.  We discussed the possibility of aortogram lower extremity runoff possible intervention.  He wishes to think about whether or not he would want to proceed or continue with conservative management for now with walking on a treadmill at least 30 minutes daily.  Plan: The patient will follow-up with me with arterial duplex and ABIs in 3 months time.  He will return sooner if he decides to go through with an intervention at this point.   Ruta Hinds, MD [facility] Office: [phone] Pager: [phone]

## 2018-03-25 ENCOUNTER — Other Ambulatory Visit: Payer: Self-pay | Admitting: *Deleted

## 2018-03-25 DIAGNOSIS — I739 Peripheral vascular disease, unspecified: Secondary | ICD-10-CM

## 2018-05-19 ENCOUNTER — Ambulatory Visit (HOSPITAL_COMMUNITY)
Admission: RE | Admit: 2018-05-19 | Discharge: 2018-05-19 | Disposition: A | Discharge status: Home / Self Care | Payer: No Typology Code available for payment source | Source: Ambulatory Visit | Attending: Family | Admitting: Family

## 2018-05-19 ENCOUNTER — Ambulatory Visit: Payer: Non-veteran care | Admitting: Vascular Surgery

## 2018-05-19 ENCOUNTER — Ambulatory Visit (INDEPENDENT_AMBULATORY_CARE_PROVIDER_SITE_OTHER)
Admission: RE | Admit: 2018-05-19 | Discharge: 2018-05-19 | Disposition: A | Discharge status: Home / Self Care | Payer: No Typology Code available for payment source | Source: Ambulatory Visit | Attending: Family | Admitting: Family

## 2018-05-19 DIAGNOSIS — I739 Peripheral vascular disease, unspecified: Secondary | ICD-10-CM

## 2018-06-07 ENCOUNTER — Encounter: Payer: Self-pay | Admitting: Surgery

## 2018-06-09 ENCOUNTER — Ambulatory Visit: Payer: Non-veteran care | Admitting: Vascular Surgery

## 2018-11-23 ENCOUNTER — Emergency Department (HOSPITAL_COMMUNITY)
Admission: EM | Admit: 2018-11-23 | Discharge: 2018-11-23 | Disposition: A | Discharge status: Home / Self Care | Payer: No Typology Code available for payment source | Attending: Emergency Medicine | Admitting: Emergency Medicine

## 2018-11-23 ENCOUNTER — Other Ambulatory Visit: Payer: Self-pay

## 2018-11-23 ENCOUNTER — Encounter (HOSPITAL_COMMUNITY): Payer: Self-pay | Admitting: Emergency Medicine

## 2018-11-23 DIAGNOSIS — I10 Essential (primary) hypertension: Secondary | ICD-10-CM | POA: Diagnosis not present

## 2018-11-23 DIAGNOSIS — R319 Hematuria, unspecified: Secondary | ICD-10-CM | POA: Diagnosis present

## 2018-11-23 DIAGNOSIS — Z955 Presence of coronary angioplasty implant and graft: Secondary | ICD-10-CM | POA: Diagnosis not present

## 2018-11-23 DIAGNOSIS — I82409 Acute embolism and thrombosis of unspecified deep veins of unspecified lower extremity: Secondary | ICD-10-CM | POA: Insufficient documentation

## 2018-11-23 DIAGNOSIS — Z87891 Personal history of nicotine dependence: Secondary | ICD-10-CM | POA: Diagnosis not present

## 2018-11-23 DIAGNOSIS — I259 Chronic ischemic heart disease, unspecified: Secondary | ICD-10-CM | POA: Diagnosis not present

## 2018-11-23 LAB — CBC WITH DIFFERENTIAL/PLATELET
Abs Immature Granulocytes: 0.02 10*3/uL (ref 0.00–0.07)
Basophils Absolute: 0.1 10*3/uL (ref 0.0–0.1)
Basophils Relative: 1 %
Eosinophils Absolute: 0.2 10*3/uL (ref 0.0–0.5)
Eosinophils Relative: 3 %
HCT: 41.5 % (ref 39.0–52.0)
Hemoglobin: 13.9 g/dL (ref 13.0–17.0)
Immature Granulocytes: 0 %
Lymphocytes Relative: 22 %
Lymphs Abs: 1.6 10*3/uL (ref 0.7–4.0)
MCH: 30.2 pg (ref 26.0–34.0)
MCHC: 33.5 g/dL (ref 30.0–36.0)
MCV: 90.2 fL (ref 80.0–100.0)
Monocytes Absolute: 0.5 10*3/uL (ref 0.1–1.0)
Monocytes Relative: 8 %
Neutro Abs: 4.6 10*3/uL (ref 1.7–7.7)
Neutrophils Relative %: 66 %
Platelets: 205 10*3/uL (ref 150–400)
RBC: 4.6 MIL/uL (ref 4.22–5.81)
RDW: 14 % (ref 11.5–15.5)
WBC: 6.9 10*3/uL (ref 4.0–10.5)
nRBC: 0 % (ref 0.0–0.2)

## 2018-11-23 LAB — URINALYSIS, ROUTINE W REFLEX MICROSCOPIC

## 2018-11-23 LAB — URINALYSIS, MICROSCOPIC (REFLEX): RBC / HPF: >50 RBC/hpf (ref 0–5)

## 2018-11-23 LAB — BASIC METABOLIC PANEL
Anion gap: 13 (ref 5–15)
BUN: 31 mg/dL — ABNORMAL HIGH (ref 8–23)
CO2: 26 mmol/L (ref 22–32)
Calcium: 9.9 mg/dL (ref 8.9–10.3)
Chloride: 96 mmol/L — ABNORMAL LOW (ref 98–111)
Creatinine, Ser: 1.41 mg/dL — ABNORMAL HIGH (ref 0.61–1.24)
GFR calc Af Amer: 55 mL/min — ABNORMAL LOW (ref >60)
GFR calc non Af Amer: 47 mL/min — ABNORMAL LOW (ref >60)
Glucose, Bld: 99 mg/dL (ref 70–99)
Potassium: 3.4 mmol/L — ABNORMAL LOW (ref 3.5–5.1)
Sodium: 135 mmol/L (ref 135–145)

## 2018-11-23 MED ORDER — STERILE WATER FOR INJECTION IJ SOLN
INTRAMUSCULAR | Status: AC
  Filled 2018-11-23: qty 10

## 2018-11-23 MED ORDER — CEFTRIAXONE SODIUM 1 G IJ SOLR
1.0000 g | Freq: Once | INTRAMUSCULAR | Status: AC
  Administered 2018-11-23: 1 g via INTRAMUSCULAR
  Filled 2018-11-23: qty 10

## 2018-11-23 MED ORDER — CEPHALEXIN 500 MG PO CAPS: 500.0000 mg | ORAL_CAPSULE | Freq: Four times a day (QID) | ORAL | 0 refills | Status: AC

## 2018-11-23 NOTE — ED Provider Notes (Signed)
Emergency Department Provider Note   I have reviewed the triage vital signs and the nursing notes.   HISTORY  Chief Complaint Hematuria   HPI Raymond Jacobson is a 78 y.o. male presents emergency department today with painless hematuria that started approximately 3 days ago progressively worsened since that time.  Is also had some urgency and increased frequency of urination.  No nausea vomiting diarrhea or constipation.  No back pain or abdominal pain.  Did have some suprapubic pressure but no other associated symptoms.  No GI symptoms.  No rashes.  No trauma.  No history of the same.  States that he is currently be treated with clindamycin for an abscess on his back.  He talk to his primary doctor who stated continue that for his likely urine infection as well.   No other associated or modifying symptoms.    Past Medical History:  Diagnosis Date  . Angina    last time >yr  . Arthritis    "knees, ankles" (10/15/2015)  . Basal cell carcinoma of scalp    "froze off"  . CAD (coronary artery disease)    Previous stents last 6 years ago [place] CT  . Complication of anesthesia   . Cough    recent bronchitis- dry cough  . Dry cough   . DVT (deep venous thrombosis) ([place] [address]) ~ 1969 12/2015   LLE..   . Elevated lipids   . GERD (gastroesophageal reflux disease)    occ -tums  . High cholesterol   . History of hiatal hernia    hx  . Hypertension   . Myocardial infarction [facility]) ~ 2006   "blood work"  . Peripheral vascular disease ([place])   . Pulmonary emboli ([place]) 12/2015   after amputation  . Shortness of breath 04/13/11   "mostly w/exertion"/ now with bronchitis    Patient Active Problem List   Diagnosis Date Noted  . Unilateral AKA, left ([place])   . History of DVT (deep vein thrombosis)   . Primary osteoarthritis of left knee   . PVD (peripheral vascular disease) ([place])   . Pain of amputation stump of left lower extremity ([place]) 11/09/2016  . Prediabetes   . Benign essential HTN    . Status post above knee amputation of left lower extremity 01/17/2016  . Abdominal distension   . Unilateral AKA ([place])   . Coronary artery disease involving native coronary artery of native heart without angina pectoris   . 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 HLD (hyperlipidemia)   . Post-operative pain   . Acute blood loss anemia   . Right lower lobe pneumonia ([place])   . Diastolic dysfunction   . Tachypnea   . Tachycardia   . Hypokalemia   . Leukocytosis   . Thrombocytopenia ([place])   . Surgery, other elective   . Pain of upper abdomen   . Pneumothorax on right   . Chest trauma   . Acute respiratory failure ([place])   . Pre-operative cardiovascular examination 12/31/2015  . PAD (peripheral artery disease) ([place]) 10/15/2015  . ACS (acute coronary syndrome) ([place]) 06/20/2013  . HTN (hypertension) 04/13/2011  . Elevated lipids 04/13/2011  . CAD S/P multiple PCIs 04/13/2011    Past Surgical History:  Procedure Laterality Date  . AMPUTATION Left 01/07/2016   Procedure: AMPUTATION LEFT LEG  ABOVE KNEE;  Surgeon: Elam Dutch, MD;  Location: [place];  Service: Vascular;  Laterality: Left;  . AMPUTATION Left 11/09/2016  Procedure: REVISION LEFT ABOVE KNEE AMPUTATION;  Surgeon: Elam Dutch, MD;  Location: [place];  Service: Vascular;  Laterality: Left;  . CARPAL TUNNEL RELEASE Right 1990s  . COLONOSCOPY W/ POLYPECTOMY    . CORONARY ANGIOPLASTY WITH STENT PLACEMENT  ~ 2004; ~ 2006; 04/13/11   "# of stents 1 (i2004);+2 (2006)+ 1 (04/13/11)= 4 total"  . ENDARTERECTOMY FEMORAL Left 01/01/2016   Procedure: LEFT FEMORAL ARTERY  ENDARTERECTOMY ;  Surgeon: Elam Dutch, MD;  Location: [place];  Service: Vascular;  Laterality: Left;  . FEMORAL ARTERY STENT Left 10/15/2015  . FEMORAL-POPLITEAL BYPASS GRAFT Left 01/01/2016   Procedure: Thrombectomy of Left Leg Femoral-Popliteal Bypass Graft;  Surgeon: Elam Dutch, MD;  Location: [place];  Service: Vascular;   Laterality: Left;  . FEMORAL-POPLITEAL BYPASS GRAFT Left 01/01/2016   Procedure: LEFT  FEMORAL-POPLITEAL ARTERY  BYPASS GRAFT ;  Surgeon: Elam Dutch, MD;  Location: [place];  Service: Vascular;  Laterality: Left;  . FEMORAL-POPLITEAL BYPASS GRAFT Left 01/06/2016   Procedure: THROMBECTOMY WITH REVISION oF dISTAL Anastomosis BYPASS GRAFT FEMORAL-POPLITEAL ARTERY.;  Surgeon: Rosetta Posner, MD;  Location: [place];  Service: Vascular;  Laterality: Left;  . FEMORAL-POPLITEAL BYPASS GRAFT Left 01/06/2016   Procedure: REDO FEMORAL to BELOW THE KNEE POPLITEAL ARTERY BYPASS USING 43mm PROPATEN GRAFT;  Surgeon: Elam Dutch, MD;  Location: [place];  Service: Vascular;  Laterality: Left;  . FINGER AMPUTATION Right 1967   "partial; right pointer"  . INTRAOPERATIVE ARTERIOGRAM Left 01/01/2016   Procedure: INTRA OPERATIVE ARTERIOGRAM;  Surgeon: Elam Dutch, MD;  Location: [place];  Service: Vascular;  Laterality: Left;  . LEFT HEART CATHETERIZATION WITH CORONARY ANGIOGRAM N/A 04/13/2011   Procedure: LEFT HEART CATHETERIZATION WITH CORONARY ANGIOGRAM;  Surgeon: Josue Hector, MD;  Location: [facility] CATH LAB;  Service: Cardiovascular;  Laterality: N/A;  . LEFT HEART CATHETERIZATION WITH CORONARY ANGIOGRAM N/A 06/20/2013   Procedure: LEFT HEART CATHETERIZATION WITH CORONARY ANGIOGRAM;  Surgeon: Leonie Man, MD;  [facility] C[facility] LAB;  Service: Cardiovascular;  Laterality: N/A;  . PERCUTANEOUS CORONARY STENT INTERVENTION (PCI-S) Right 04/13/2011   Procedure: PERCUTANEOUS CORONARY STENT INTERVENTION (PCI-S);  Surgeon: Burnell Blanks, MD;  Location: [facility] CATH LAB;  Service: Cardiovascular;  Laterality: Right;  . PERIPHERAL VASCULAR CATHETERIZATION N/A 10/15/2015   Procedure: Abdominal Aortogram w/Lower Extremity;  Surgeon: Serafina Mitchell, MD;  Location: [place] CV LAB;  Service: Cardiovascular;  Laterality: N/A;  . TONSILLECTOMY     "when I was a young one"  . Tricep Surgery  ~ 2007   "right arm; tore all my  muscles @ my elbow"  . VEIN HARVEST Left 01/01/2016   Procedure: USING NON-REVERSE LEFT GREATER SAPHENOUS VEIN HARVEST;  Surgeon: Elam Dutch, MD;  Location: [place];  Service: Vascular;  Laterality: Left;    Current Outpatient Rx  . Order #: [id number] Class: Historical Med  . Order #: [id number] Class: Historical Med  . Order #: [id number] Class: Historical Med  . Order #: [id number] Class: Historical Med  . Order #: [id number] Class: Print  . Order #: [id number] Class: Historical Med  . Order #: [id number] Class: Historical Med  . Order #: [id number] Class: Print  . Order #: [id number] Class: Historical Med  . Order #: [id number] Class: Normal  . Order #: [id number] Class: Print  . Order #: [id number] Class: Print  . Order #: [id number] Class: Print    Allergies No known allergies  Family History  Problem Relation Age of Onset  . Cancer Father   . Heart attack Paternal  Grandfather     Social History Social History   Tobacco Use  . Smoking status: Former Smoker    Packs/day: 1.00    Years: 12.00    Pack years: 12.00    Types: Cigarettes    Quit date: 12/24/1967    Years since quitting: 50.9  . Smokeless tobacco: Never Used  . Tobacco comment: "quit smoking cigarettes 1969"  Substance Use Topics  . Alcohol use: No    Alcohol/week: 0.0 standard drinks    Comment: 10/15/2015 last beer was summer 2012; prior to that ~2008""  . Drug use: No    Review of Systems  All other systems negative except as documented in the HPI. All pertinent positives and negatives as reviewed in the HPI. ____________________________________________   PHYSICAL EXAM:  VITAL SIGNS: ED Triage Vitals  Enc Vitals Group     BP 11/23/18 1720 (!) 162/90     Pulse Rate 11/23/18 1720 73     Resp 11/23/18 1720 16     Temp 11/23/18 1720 (!) 97 F (36.1 C)     Temp Source 11/23/18 1720 Temporal     SpO2 11/23/18 1720 98 %     Weight 11/23/18 1721 200 lb (90.7 kg)     Height 11/23/18 1721 5\' 8"  (1.727 m)     Head  Circumference --      Peak Flow --      Pain Score 11/23/18 1719 6     Pain Loc --      Pain Edu? --      Excl. in [place]? --     Constitutional: Alert and oriented. Well appearing and in no acute distress. Eyes: Conjunctivae are normal. PERRL. EOMI. Head: Atraumatic. Nose: No congestion/rhinnorhea. Mouth/Throat: Mucous membranes are moist.  Oropharynx non-erythematous. Neck: No stridor.  No meningeal signs.   Cardiovascular: Normal rate, regular rhythm. Good peripheral circulation. Grossly normal heart sounds.   Respiratory: Normal respiratory effort.  No retractions. Lungs CTAB. Gastrointestinal: Soft and nontender. No distention.  Musculoskeletal: No lower extremity tenderness nor edema. No gross deformities of extremities. Neurologic:  Normal speech and language. No gross focal neurologic deficits are appreciated.  Skin:  Skin is warm, dry and intact. No rash noted.   ____________________________________________   LABS (all labs ordered are listed, but only abnormal results are displayed)  Labs Reviewed  URINALYSIS, ROUTINE W REFLEX MICROSCOPIC - Abnormal; Notable for the following components:      Result Value   Color, Urine RED (*)    APPearance TURBID (*)    Glucose, UA   (*)    Value: TEST NOT REPORTED DUE TO COLOR INTERFERENCE OF URINE PIGMENT   Hgb urine dipstick   (*)    Value: TEST NOT REPORTED DUE TO COLOR INTERFERENCE OF URINE PIGMENT   Bilirubin Urine   (*)    Value: TEST NOT REPORTED DUE TO COLOR INTERFERENCE OF URINE PIGMENT   Ketones, ur   (*)    Value: TEST NOT REPORTED DUE TO COLOR INTERFERENCE OF URINE PIGMENT   Protein, ur   (*)    Value: TEST NOT REPORTED DUE TO COLOR INTERFERENCE OF URINE PIGMENT   Nitrite   (*)    Value: TEST NOT REPORTED DUE TO COLOR INTERFERENCE OF URINE PIGMENT   Leukocytes,Ua   (*)    Value: TEST NOT REPORTED DUE TO COLOR INTERFERENCE OF URINE PIGMENT   All other components within normal limits  BASIC METABOLIC PANEL - Abnormal;  Notable for the following  components:   Potassium 3.4 (*)    Chloride 96 (*)    BUN 31 (*)    Creatinine, Ser 1.41 (*)    GFR calc non Af Amer 47 (*)    GFR calc Af Amer 55 (*)    All other components within normal limits  URINALYSIS, MICROSCOPIC (REFLEX) - Abnormal; Notable for the following components:   Bacteria, UA FEW (*)    All other components within normal limits  URINE CULTURE  CBC WITH DIFFERENTIAL/PLATELET   ____________________________________________  EKG   EKG Interpretation  Date/Time:    Ventricular Rate:    PR Interval:    QRS Duration:   QT Interval:    QTC Calculation:   R Axis:     Text Interpretation:         ____________________________________________  RADIOLOGY  No results found.  ____________________________________________   PROCEDURES  Procedure(s) performed:   Procedures   ____________________________________________   INITIAL IMPRESSION / ASSESSMENT AND PLAN / ED COURSE  Suspect UTI. No pain to suggest stone. No significant renal impairment to suggest advanced RCC or renal pathology. Will switch clindamycin to keflex, culture sent. Will fu w/ pcp on phone tomorrow and in a week to ensure improvement.   Pertinent labs & imaging results that were available during my care of the patient were reviewed by me and considered in my medical decision making (see chart for details).  A medical screening exam was performed and I feel the patient has had an appropriate workup for their chief complaint at this time and likelihood of emergent condition existing is low. They have been counseled on decision, discharge, follow up and which symptoms necessitate immediate return to the emergency department. They or their family verbally stated understanding and agreement with plan and discharged in stable condition.   ____________________________________________  FINAL CLINICAL IMPRESSION(S) / ED DIAGNOSES  Final diagnoses:  Hematuria,  unspecified type     MEDICATIONS GIVEN DURING THIS VISIT:  Medications  cefTRIAXone (ROCEPHIN) injection 1 g (1 g Intramuscular Given 11/23/18 2352)     NEW OUTPATIENT MEDICATIONS STARTED DURING THIS VISIT:  Discharge Medication List as of 11/23/2018 11:39 PM    START taking these medications   Details  cephALEXin (KEFLEX) 500 MG capsule Take 1 capsule (500 mg total) by mouth 4 (four) times daily., Starting Wed 11/23/2018, Normal        Note:  This note was prepared with assistance of Dragon voice recognition software. Occasional wrong-word or sound-a-like substitutions may have occurred due to the inherent limitations of voice recognition software.   Reata Petrov, Corene Cornea, MD 11/24/18 [phone]

## 2018-11-23 NOTE — ED Notes (Signed)
Lab in room.

## 2018-11-23 NOTE — ED Notes (Signed)
Pt took self to rest room at this time

## 2018-11-23 NOTE — ED Notes (Signed)
Patient states that he has frequent urge to urinate but does only a little. States he started on a new antibiotic on Thursday and on Saturday started to have blood in urine with cramping. States that he had a lot of blood in urine today.

## 2018-11-23 NOTE — ED Triage Notes (Signed)
Pt c/o of hematuria and dysuria x 2 days

## 2018-11-25 LAB — URINE CULTURE: Culture: NO GROWTH
